# Patient Record
Sex: Female | Born: 1970 | Race: Black or African American | Hispanic: No | Marital: Married | Smoking: Never smoker
Health system: Southern US, Community
[De-identification: ages and names within clinical notes are randomized; demographics above are authoritative.]

## PROBLEM LIST (undated history)

## (undated) DIAGNOSIS — Z9229 Personal history of other drug therapy: Secondary | ICD-10-CM

## (undated) DIAGNOSIS — M1990 Unspecified osteoarthritis, unspecified site: Secondary | ICD-10-CM

## (undated) DIAGNOSIS — K219 Gastro-esophageal reflux disease without esophagitis: Secondary | ICD-10-CM

## (undated) DIAGNOSIS — T7840XA Allergy, unspecified, initial encounter: Secondary | ICD-10-CM

## (undated) DIAGNOSIS — E785 Hyperlipidemia, unspecified: Secondary | ICD-10-CM

## (undated) DIAGNOSIS — K859 Acute pancreatitis without necrosis or infection, unspecified: Secondary | ICD-10-CM

## (undated) DIAGNOSIS — N200 Calculus of kidney: Secondary | ICD-10-CM

## (undated) DIAGNOSIS — R519 Headache, unspecified: Secondary | ICD-10-CM

## (undated) DIAGNOSIS — J45909 Unspecified asthma, uncomplicated: Secondary | ICD-10-CM

## (undated) DIAGNOSIS — H269 Unspecified cataract: Secondary | ICD-10-CM

## (undated) HISTORY — DX: Calculus of kidney: N20.0

## (undated) HISTORY — PX: EYE SURGERY: SHX253

## (undated) HISTORY — DX: Gastro-esophageal reflux disease without esophagitis: K21.9

## (undated) HISTORY — PX: HIP ARTHROPLASTY: SHX981

## (undated) HISTORY — DX: Allergy, unspecified, initial encounter: T78.40XA

## (undated) HISTORY — DX: Unspecified osteoarthritis, unspecified site: M19.90

## (undated) HISTORY — PX: CHOLECYSTECTOMY: SHX55

## (undated) HISTORY — PX: HIP SURGERY: SHX245

## (undated) HISTORY — DX: Personal history of other drug therapy: Z92.29

## (undated) HISTORY — PX: ABDOMINAL HYSTERECTOMY: SHX81

## (undated) HISTORY — DX: Hyperlipidemia, unspecified: E78.5

## (undated) HISTORY — PX: OTHER SURGICAL HISTORY: SHX169

## (undated) HISTORY — PX: TUBAL LIGATION: SHX77

## (undated) HISTORY — PX: JOINT REPLACEMENT: SHX530

---

## 1997-09-04 ENCOUNTER — Encounter: Admission: RE | Admit: 1997-09-04 | Discharge: 1997-09-04 | Payer: Self-pay | Admitting: Obstetrics & Gynecology

## 1997-09-17 ENCOUNTER — Ambulatory Visit (HOSPITAL_COMMUNITY): Admission: RE | Admit: 1997-09-17 | Discharge: 1997-09-17 | Payer: Self-pay | Admitting: Obstetrics & Gynecology

## 1997-11-12 ENCOUNTER — Emergency Department (HOSPITAL_COMMUNITY): Admission: EM | Admit: 1997-11-12 | Discharge: 1997-11-12 | Payer: Self-pay | Admitting: Emergency Medicine

## 1998-01-10 ENCOUNTER — Emergency Department (HOSPITAL_COMMUNITY): Admission: EM | Admit: 1998-01-10 | Discharge: 1998-01-10 | Payer: Self-pay

## 1998-01-23 ENCOUNTER — Other Ambulatory Visit: Admission: RE | Admit: 1998-01-23 | Discharge: 1998-01-23 | Payer: Self-pay | Admitting: Obstetrics & Gynecology

## 1998-02-18 ENCOUNTER — Ambulatory Visit (HOSPITAL_COMMUNITY): Admission: RE | Admit: 1998-02-18 | Discharge: 1998-02-18 | Payer: Self-pay | Admitting: Obstetrics & Gynecology

## 1998-09-17 ENCOUNTER — Encounter: Admission: RE | Admit: 1998-09-17 | Discharge: 1998-12-16 | Payer: Self-pay | Admitting: *Deleted

## 1998-10-15 ENCOUNTER — Inpatient Hospital Stay (HOSPITAL_COMMUNITY): Admission: AD | Admit: 1998-10-15 | Discharge: 1998-10-15 | Payer: Self-pay | Admitting: Obstetrics and Gynecology

## 1998-11-28 ENCOUNTER — Inpatient Hospital Stay (HOSPITAL_COMMUNITY): Admission: AD | Admit: 1998-11-28 | Discharge: 1998-11-28 | Payer: Self-pay | Admitting: Obstetrics and Gynecology

## 1999-03-01 ENCOUNTER — Emergency Department (HOSPITAL_COMMUNITY): Admission: EM | Admit: 1999-03-01 | Discharge: 1999-03-01 | Payer: Self-pay | Admitting: Emergency Medicine

## 1999-03-14 ENCOUNTER — Encounter: Admission: RE | Admit: 1999-03-14 | Discharge: 1999-04-03 | Payer: Self-pay | Admitting: Orthopedic Surgery

## 1999-04-01 ENCOUNTER — Other Ambulatory Visit: Admission: RE | Admit: 1999-04-01 | Discharge: 1999-04-01 | Payer: Self-pay | Admitting: Obstetrics & Gynecology

## 1999-11-15 ENCOUNTER — Inpatient Hospital Stay (HOSPITAL_COMMUNITY): Admission: AD | Admit: 1999-11-15 | Discharge: 1999-11-15 | Payer: Self-pay | Admitting: Obstetrics and Gynecology

## 2002-03-17 ENCOUNTER — Other Ambulatory Visit: Admission: RE | Admit: 2002-03-17 | Discharge: 2002-03-17 | Payer: Self-pay | Admitting: Obstetrics and Gynecology

## 2002-06-30 ENCOUNTER — Ambulatory Visit (HOSPITAL_COMMUNITY): Admission: RE | Admit: 2002-06-30 | Discharge: 2002-06-30 | Payer: Self-pay | Admitting: Obstetrics and Gynecology

## 2004-10-15 ENCOUNTER — Ambulatory Visit (HOSPITAL_COMMUNITY): Admission: RE | Admit: 2004-10-15 | Discharge: 2004-10-15 | Payer: Self-pay | Admitting: Obstetrics and Gynecology

## 2005-05-16 ENCOUNTER — Emergency Department (HOSPITAL_COMMUNITY): Admission: EM | Admit: 2005-05-16 | Discharge: 2005-05-16 | Payer: Self-pay | Admitting: Emergency Medicine

## 2005-08-22 ENCOUNTER — Emergency Department (HOSPITAL_COMMUNITY): Admission: EM | Admit: 2005-08-22 | Discharge: 2005-08-22 | Payer: Self-pay | Admitting: Emergency Medicine

## 2005-09-07 ENCOUNTER — Ambulatory Visit: Payer: Self-pay | Admitting: Gastroenterology

## 2005-09-30 ENCOUNTER — Ambulatory Visit: Payer: Self-pay | Admitting: Gastroenterology

## 2005-10-06 ENCOUNTER — Ambulatory Visit: Payer: Self-pay | Admitting: Gastroenterology

## 2006-10-20 ENCOUNTER — Ambulatory Visit: Payer: Self-pay | Admitting: Internal Medicine

## 2006-10-20 LAB — CONVERTED CEMR LAB
Albumin: 3.7 g/dL (ref 3.5–5.2)
Basophils Absolute: 0 10*3/uL (ref 0.0–0.1)
Basophils Relative: 0.6 % (ref 0.0–1.0)
Bilirubin Urine: NEGATIVE
Bilirubin, Direct: 0.1 mg/dL (ref 0.0–0.3)
CO2: 29 meq/L (ref 19–32)
Cholesterol: 248 mg/dL (ref 0–200)
Crystals: NEGATIVE
Eosinophils Absolute: 0.2 10*3/uL (ref 0.0–0.6)
Eosinophils Relative: 2.5 % (ref 0.0–5.0)
GFR calc non Af Amer: 86 mL/min
Glucose, Bld: 97 mg/dL (ref 70–99)
Hemoglobin, Urine: NEGATIVE
Hemoglobin: 12.3 g/dL (ref 12.0–15.0)
MCHC: 34 g/dL (ref 30.0–36.0)
MCV: 87.3 fL (ref 78.0–100.0)
Monocytes Absolute: 0.5 10*3/uL (ref 0.2–0.7)
Nitrite: POSITIVE — AB
Platelets: 301 10*3/uL (ref 150–400)
RBC: 4.13 M/uL (ref 3.87–5.11)
RDW: 12.6 % (ref 11.5–14.6)
Sodium: 141 meq/L (ref 135–145)
Specific Gravity, Urine: 1.025 (ref 1.000–1.03)
Total Protein, Urine: NEGATIVE mg/dL
Triglycerides: 52 mg/dL (ref 0–149)
WBC: 7.9 10*3/uL (ref 4.5–10.5)
pH: 6 (ref 5.0–8.0)

## 2007-02-16 ENCOUNTER — Ambulatory Visit: Payer: Self-pay | Admitting: Internal Medicine

## 2007-02-16 ENCOUNTER — Encounter: Payer: Self-pay | Admitting: Internal Medicine

## 2007-02-16 DIAGNOSIS — Z8639 Personal history of other endocrine, nutritional and metabolic disease: Secondary | ICD-10-CM

## 2007-02-16 DIAGNOSIS — J309 Allergic rhinitis, unspecified: Secondary | ICD-10-CM | POA: Insufficient documentation

## 2007-02-16 DIAGNOSIS — K219 Gastro-esophageal reflux disease without esophagitis: Secondary | ICD-10-CM | POA: Insufficient documentation

## 2007-02-16 DIAGNOSIS — Z862 Personal history of diseases of the blood and blood-forming organs and certain disorders involving the immune mechanism: Secondary | ICD-10-CM | POA: Insufficient documentation

## 2007-02-16 DIAGNOSIS — B3731 Acute candidiasis of vulva and vagina: Secondary | ICD-10-CM | POA: Insufficient documentation

## 2007-02-16 DIAGNOSIS — B373 Candidiasis of vulva and vagina: Secondary | ICD-10-CM

## 2007-02-16 DIAGNOSIS — E785 Hyperlipidemia, unspecified: Secondary | ICD-10-CM | POA: Insufficient documentation

## 2007-02-16 DIAGNOSIS — N760 Acute vaginitis: Secondary | ICD-10-CM | POA: Insufficient documentation

## 2007-02-16 LAB — CONVERTED CEMR LAB
BUN: 8 mg/dL (ref 6–23)
Chloride: 106 meq/L (ref 96–112)
Potassium: 5 meq/L (ref 3.5–5.1)
Sodium: 139 meq/L (ref 135–145)

## 2007-06-16 ENCOUNTER — Ambulatory Visit: Payer: Self-pay | Admitting: Internal Medicine

## 2007-06-16 DIAGNOSIS — H6690 Otitis media, unspecified, unspecified ear: Secondary | ICD-10-CM | POA: Insufficient documentation

## 2008-02-29 ENCOUNTER — Encounter: Admission: RE | Admit: 2008-02-29 | Discharge: 2008-02-29 | Payer: Self-pay | Admitting: Obstetrics and Gynecology

## 2008-03-16 ENCOUNTER — Encounter: Admission: RE | Admit: 2008-03-16 | Discharge: 2008-03-16 | Payer: Self-pay | Admitting: Obstetrics and Gynecology

## 2009-02-20 ENCOUNTER — Encounter: Admission: RE | Admit: 2009-02-20 | Discharge: 2009-02-20 | Payer: Self-pay | Admitting: Gastroenterology

## 2009-02-22 ENCOUNTER — Emergency Department (HOSPITAL_COMMUNITY): Admission: EM | Admit: 2009-02-22 | Discharge: 2009-02-23 | Payer: Self-pay | Admitting: Emergency Medicine

## 2009-04-27 HISTORY — PX: OTHER SURGICAL HISTORY: SHX169

## 2010-02-18 ENCOUNTER — Ambulatory Visit (HOSPITAL_COMMUNITY): Admission: RE | Admit: 2010-02-18 | Discharge: 2010-02-18 | Payer: Self-pay | Admitting: Obstetrics and Gynecology

## 2010-03-02 ENCOUNTER — Inpatient Hospital Stay (HOSPITAL_COMMUNITY): Admission: AD | Admit: 2010-03-02 | Discharge: 2010-03-02 | Payer: Self-pay | Admitting: Obstetrics

## 2010-07-09 LAB — TYPE AND SCREEN
ABO/RH(D): O POS
DAT, IgG: NEGATIVE
Unit division: 0

## 2010-07-09 LAB — ABO/RH: ABO/RH(D): O POS

## 2010-07-09 LAB — CBC
HCT: 38.2 % (ref 36.0–46.0)
Hemoglobin: 13.2 g/dL (ref 12.0–15.0)
MCHC: 34.5 g/dL (ref 30.0–36.0)
MCV: 87 fL (ref 78.0–100.0)
WBC: 8.1 10*3/uL (ref 4.0–10.5)

## 2010-07-09 LAB — SURGICAL PCR SCREEN
MRSA, PCR: NEGATIVE
Staphylococcus aureus: NEGATIVE

## 2010-07-31 LAB — URINE CULTURE
Colony Count: NO GROWTH
Culture: NO GROWTH

## 2010-07-31 LAB — URINALYSIS, ROUTINE W REFLEX MICROSCOPIC
Bilirubin Urine: NEGATIVE
Glucose, UA: NEGATIVE mg/dL
Hgb urine dipstick: NEGATIVE

## 2010-07-31 LAB — URINE MICROSCOPIC-ADD ON

## 2010-07-31 LAB — DIFFERENTIAL
Basophils Absolute: 0 10*3/uL (ref 0.0–0.1)
Basophils Relative: 0 % (ref 0–1)
Eosinophils Relative: 4 % (ref 0–5)
Lymphocytes Relative: 33 % (ref 12–46)
Lymphs Abs: 3.5 10*3/uL (ref 0.7–4.0)
Neutrophils Relative %: 58 % (ref 43–77)

## 2010-07-31 LAB — CBC
HCT: 38.4 % (ref 36.0–46.0)
RDW: 13.4 % (ref 11.5–15.5)
WBC: 10.5 10*3/uL (ref 4.0–10.5)

## 2010-07-31 LAB — POCT I-STAT, CHEM 8
Chloride: 105 mEq/L (ref 96–112)
HCT: 40 % (ref 36.0–46.0)

## 2010-07-31 LAB — POCT PREGNANCY, URINE: Preg Test, Ur: NEGATIVE

## 2010-10-03 ENCOUNTER — Emergency Department (HOSPITAL_COMMUNITY): Payer: 59

## 2010-10-03 ENCOUNTER — Emergency Department (HOSPITAL_COMMUNITY)
Admission: EM | Admit: 2010-10-03 | Discharge: 2010-10-04 | Disposition: A | Discharge status: Home / Self Care | Payer: 59 | Attending: Emergency Medicine | Admitting: Emergency Medicine

## 2010-10-03 DIAGNOSIS — R197 Diarrhea, unspecified: Secondary | ICD-10-CM | POA: Insufficient documentation

## 2010-10-03 DIAGNOSIS — Z87442 Personal history of urinary calculi: Secondary | ICD-10-CM | POA: Insufficient documentation

## 2010-10-03 DIAGNOSIS — R112 Nausea with vomiting, unspecified: Secondary | ICD-10-CM | POA: Insufficient documentation

## 2010-10-03 DIAGNOSIS — R109 Unspecified abdominal pain: Secondary | ICD-10-CM | POA: Insufficient documentation

## 2010-10-03 LAB — COMPREHENSIVE METABOLIC PANEL
AST: 15 U/L (ref 0–37)
Albumin: 3.9 g/dL (ref 3.5–5.2)
Alkaline Phosphatase: 69 U/L (ref 39–117)
BUN: 10 mg/dL (ref 6–23)
Glucose, Bld: 95 mg/dL (ref 70–99)
Potassium: 4.1 mEq/L (ref 3.5–5.1)
Sodium: 137 mEq/L (ref 135–145)
Total Bilirubin: 0.4 mg/dL (ref 0.3–1.2)
Total Protein: 7.3 g/dL (ref 6.0–8.3)

## 2010-10-03 LAB — URINALYSIS, ROUTINE W REFLEX MICROSCOPIC
Hgb urine dipstick: NEGATIVE
Nitrite: NEGATIVE

## 2010-10-03 LAB — DIFFERENTIAL
Basophils Relative: 1 % (ref 0–1)
Monocytes Absolute: 0.5 10*3/uL (ref 0.1–1.0)
Neutrophils Relative %: 47 % (ref 43–77)

## 2010-10-03 LAB — CBC
HCT: 38.7 % (ref 36.0–46.0)
MCH: 29 pg (ref 26.0–34.0)
MCHC: 33.9 g/dL (ref 30.0–36.0)
MCV: 85.6 fL (ref 78.0–100.0)
Platelets: 284 10*3/uL (ref 150–400)
RBC: 4.52 MIL/uL (ref 3.87–5.11)
RDW: 13.1 % (ref 11.5–15.5)
WBC: 8.8 10*3/uL (ref 4.0–10.5)

## 2010-10-04 LAB — PREGNANCY, URINE: Preg Test, Ur: NEGATIVE

## 2011-01-22 ENCOUNTER — Other Ambulatory Visit: Payer: Self-pay | Admitting: Family Medicine

## 2011-01-22 DIAGNOSIS — Z1231 Encounter for screening mammogram for malignant neoplasm of breast: Secondary | ICD-10-CM

## 2011-01-22 DIAGNOSIS — M792 Neuralgia and neuritis, unspecified: Secondary | ICD-10-CM | POA: Insufficient documentation

## 2011-01-22 DIAGNOSIS — M76899 Other specified enthesopathies of unspecified lower limb, excluding foot: Secondary | ICD-10-CM | POA: Insufficient documentation

## 2011-01-22 DIAGNOSIS — M5410 Radiculopathy, site unspecified: Secondary | ICD-10-CM | POA: Insufficient documentation

## 2011-01-22 DIAGNOSIS — M7062 Trochanteric bursitis, left hip: Secondary | ICD-10-CM | POA: Insufficient documentation

## 2011-02-24 ENCOUNTER — Ambulatory Visit
Admission: RE | Admit: 2011-02-24 | Discharge: 2011-02-24 | Disposition: A | Discharge status: Home / Self Care | Payer: 59 | Source: Ambulatory Visit | Attending: Family Medicine | Admitting: Family Medicine

## 2011-02-24 DIAGNOSIS — Z1231 Encounter for screening mammogram for malignant neoplasm of breast: Secondary | ICD-10-CM

## 2011-05-01 ENCOUNTER — Ambulatory Visit (INDEPENDENT_AMBULATORY_CARE_PROVIDER_SITE_OTHER): Payer: 59

## 2011-05-01 DIAGNOSIS — M940 Chondrocostal junction syndrome [Tietze]: Secondary | ICD-10-CM

## 2011-05-01 DIAGNOSIS — R079 Chest pain, unspecified: Secondary | ICD-10-CM

## 2011-06-06 ENCOUNTER — Other Ambulatory Visit: Payer: Self-pay | Admitting: Physician Assistant

## 2011-06-07 ENCOUNTER — Ambulatory Visit (INDEPENDENT_AMBULATORY_CARE_PROVIDER_SITE_OTHER): Payer: 59 | Admitting: Physician Assistant

## 2011-06-07 DIAGNOSIS — B001 Herpesviral vesicular dermatitis: Secondary | ICD-10-CM

## 2011-06-07 DIAGNOSIS — B009 Herpesviral infection, unspecified: Secondary | ICD-10-CM

## 2011-06-07 DIAGNOSIS — N760 Acute vaginitis: Secondary | ICD-10-CM

## 2011-06-07 LAB — POCT WET PREP WITH KOH
KOH Prep POC: NEGATIVE
RBC Wet Prep HPF POC: NEGATIVE

## 2011-06-07 MED ORDER — VALACYCLOVIR HCL 1 G PO TABS: ORAL_TABLET | ORAL | Status: DC

## 2011-06-07 MED ORDER — AMBULATORY NON FORMULARY MEDICATION: 1.0000 | Status: DC

## 2011-06-07 NOTE — Progress Notes (Signed)
  Subjective:    Patient ID: Kendra Gonzalez, female    DOB: 25-Aug-1970, 41 y.o.   MRN: [id number]  HPI Pt presents for recurrent vaginal d/c and irritation.  H/o recurrent BV and yeast, seems to get it whenever she has sex with husband (no change in partner - has decrease her frequency of intercourse b/c she always seems to get BV afterwards.)  In the Fall we used boric acid and her symptoms improved significantly.  She is getting frustrated.   Review of Systems  Genitourinary: Positive for vaginal discharge (Vaginal odor). Negative for dysuria, vaginal bleeding and difficulty urinating.       Objective:   Physical Exam  Constitutional: She appears well-developed and well-nourished.  HENT:  Head: Normocephalic and atraumatic.  Right Ear: External ear normal.  Left Ear: External ear normal.   Self wet prep.        Assessment & Plan:   1. Vaginitis  POCT Wet Prep with KOH  2. Herpes labialis  valACYclovir (VALTREX) 1000 MG tablet  3. Vaginitis and vulvovaginitis, unspecified  AMBULATORY NON FORMULARY MEDICATION   Mild infection.  D/w pt her sensitivity, since the boric acid worked so well will trial it at 1 capsule weekly for 2 weeks to get back to baseline and then q monthly as symptoms are controlled.  If her symptoms seem to not be controlled may want to do after sex because that seems to be her trigger.

## 2011-07-13 ENCOUNTER — Ambulatory Visit (INDEPENDENT_AMBULATORY_CARE_PROVIDER_SITE_OTHER): Payer: 59 | Admitting: Family Medicine

## 2011-07-13 ENCOUNTER — Emergency Department (HOSPITAL_COMMUNITY)
Admission: EM | Admit: 2011-07-13 | Discharge: 2011-07-14 | Disposition: A | Discharge status: Home / Self Care | Payer: 59 | Attending: Emergency Medicine | Admitting: Emergency Medicine

## 2011-07-13 ENCOUNTER — Ambulatory Visit: Payer: 59

## 2011-07-13 ENCOUNTER — Encounter (HOSPITAL_COMMUNITY): Payer: Self-pay | Admitting: Emergency Medicine

## 2011-07-13 VITALS — BP 135/79 | HR 97 | Temp 98.4°F | Resp 24 | Ht 64.0 in | Wt 192.8 lb

## 2011-07-13 DIAGNOSIS — R0781 Pleurodynia: Secondary | ICD-10-CM

## 2011-07-13 DIAGNOSIS — R1011 Right upper quadrant pain: Secondary | ICD-10-CM

## 2011-07-13 DIAGNOSIS — R079 Chest pain, unspecified: Secondary | ICD-10-CM

## 2011-07-13 DIAGNOSIS — Z0389 Encounter for observation for other suspected diseases and conditions ruled out: Secondary | ICD-10-CM | POA: Insufficient documentation

## 2011-07-13 DIAGNOSIS — R109 Unspecified abdominal pain: Secondary | ICD-10-CM

## 2011-07-13 LAB — POCT UA - MICROSCOPIC ONLY
Bacteria, U Microscopic: NEGATIVE
Casts, Ur, LPF, POC: NEGATIVE
Crystals, Ur, HPF, POC: NEGATIVE
Epithelial cells, urine per micros: NEGATIVE
Mucus, UA: NEGATIVE
RBC, urine, microscopic: NEGATIVE
Yeast, UA: NEGATIVE

## 2011-07-13 LAB — POCT CBC
Granulocyte percent: 47.9 % (ref 37–80)
HCT, POC: 40.5 % (ref 37.7–47.9)
Hemoglobin: 13.2 g/dL (ref 12.2–16.2)
Lymph, poc: 4.9 — AB (ref 0.6–3.4)
MCH, POC: 29.4 pg (ref 27–31.2)
MCHC: 32.6 g/dL (ref 31.8–35.4)
MCV: 90.1 fL (ref 80–97)
MID (cbc): 0.9 (ref 0–0.9)
MPV: 8.9 fL (ref 0–99.8)
POC Granulocyte: 5.4 (ref 2–6.9)
POC LYMPH PERCENT: 44 % (ref 10–50)
POC MID %: 8.1 % (ref 0–12)
Platelet Count, POC: 361 10*3/uL (ref 142–424)
RBC: 4.49 M/uL (ref 4.04–5.48)
RDW, POC: 14 %
WBC: 11.2 10*3/uL — AB (ref 4.6–10.2)

## 2011-07-13 LAB — POCT URINALYSIS DIPSTICK
Bilirubin, UA: NEGATIVE
Glucose, UA: NEGATIVE
Ketones, UA: NEGATIVE
Leukocytes, UA: NEGATIVE
Nitrite, UA: NEGATIVE
Protein, UA: NEGATIVE
Spec Grav, UA: >=1.03
Urobilinogen, UA: 0.2
pH, UA: 5

## 2011-07-13 MED ORDER — KETOROLAC TROMETHAMINE 30 MG/ML IJ SOLN
30.0000 mg | Freq: Once | INTRAMUSCULAR | Status: AC
  Administered 2011-07-13: 30 mg via INTRAMUSCULAR

## 2011-07-13 NOTE — ED Notes (Signed)
Pt states she went to urgent care for right abd pain  Pt sent here for a CT scan to r/o kidney stone  Per labs there pt has a WBC of 11.2 and a trace of blood in her urine specimen  Pt states she started having pain in her right side this afternoon about 1630  Pt denies N/V  Pt states her pain is mainly in her RUQ  Denies back pain but states when the dr tapped her kidneys she had pain

## 2011-07-13 NOTE — Progress Notes (Signed)
[facility]:  Office Visit  Chief Complaint:  Chief Complaint  Patient presents with  . Abdominal Pain    RUQ-severe--started at 4:45pm when stepping over baby gate  . Back Pain    HPI: Kendra Gonzalez is a 41 y.o. female who complains of  Acute onset of 10/10 sharp  RUQ abd pain at around 5 pm, localize to RUQ. Denies fevers, chills, N/V, dysuria, hematuria, melena. History of right kidney stone 2-3 years ago; never passed stone. Has not tried anything OTC. Able to pass urine.  Past Medical History  Diagnosis Date  . Kidney stones   . Endometriosis   . Hyperlipidemia    Past Surgical History  Procedure Date  . Cholecystectomy   . Abdominal hysterectomy   . Oophrectomy 2011    secondary to cysts   History   Social History  . Marital Status: Married    Spouse Name: N/A    Number of Children: N/A  . Years of Education: N/A   Social History Main Topics  . Smoking status: Never Smoker   . Smokeless tobacco: Never Used  . Alcohol Use: No  . Drug Use: No  . Sexually Active: None   Other Topics Concern  . None   Social History Narrative  . None   No family history on file. No Known Allergies Prior to Admission medications   Medication Sig Start Date End Date Taking? Authorizing Provider  AMBULATORY NON FORMULARY MEDICATION Place 1 capsule vaginally once a week. Medication Name: Boric Acid 600mg  suppository 06/07/11  Yes Morrell Riddle, PA-C  valACYclovir (VALTREX) 1000 MG tablet 2 po at outbreak onset then repeat in 12h for each outbreak 06/07/11 06/06/12 Yes Sarah Harvie Bridge, PA-C     ROS: The patient denies fevers, chills, night sweats, unintentional weight loss, chest pain, palpitations, wheezing, dyspnea on exertion, nausea, vomiting, dysuria, hematuria, melena, numbness, weakness, or tingling. + abdominal pain  All other systems have been reviewed and were otherwise negative with the exception of those mentioned in the HPI and as above.     PHYSICAL EXAM: Filed Vitals:   07/13/11 1938  BP: 135/79  Pulse: 97  Temp: 98.4 F (36.9 C)  Resp: 24   Filed Vitals:   07/13/11 1938  Height: 5\' 4"  (1.626 m)  Weight: 192 lb 12.8 oz (87.454 kg)   Body mass index is 33.09 kg/(m^2).  General: Alert, moderate acute distress HEENT:  Normocephalic, atraumatic, oropharynx patent.  Cardiovascular:  Regular rate and rhythm, no rubs murmurs or gallops.  No Carotid bruits, radial pulse intact. No pedal edema.  Respiratory: Clear to auscultation bilaterally.  No wheezes, rales, or rhonchi.  No cyanosis, no use of accessory musculature.  GI: No organomegaly, abdomen is soft, positive bowel sounds.  No masses. + tender RUQ, + CVA  tenderness right side. No guarding,rebound Skin: No rashes. No jaundice Neurologic: Facial musculature symmetric. Psychiatric: Patient is appropriate throughout our interaction. Lymphatic: No cervical lymphadenopathy.  Musculoskeletal: Gait intact.   LABS: Results for orders placed in visit on 07/13/11  POCT CBC      Component Value Range   WBC 11.2 (*) 4.6 - 10.2 (K/uL)   Lymph, poc 4.9 (*) 0.6 - 3.4    POC LYMPH PERCENT 44.0  10 - 50 (%L)   MID (cbc) 0.9  0 - 0.9    POC MID % 8.1  0 - 12 (%M)   POC Granulocyte 5.4  2 - 6.9  Granulocyte percent 47.9  37 - 80 (%G)   RBC 4.49  4.04 - 5.48 (M/uL)   Hemoglobin 13.2  12.2 - 16.2 (g/dL)   HCT, POC 16.1  09.6 - 47.9 (%)   MCV 90.1  80 - 97 (fL)   MCH, POC 29.4  27 - 31.2 (pg)   MCHC 32.6  31.8 - 35.4 (g/dL)   RDW, POC 04.5     Platelet Count, POC 361  142 - 424 (K/uL)   MPV 8.9  0 - 99.8 (fL)  POCT UA - MICROSCOPIC ONLY      Component Value Range   WBC, Ur, HPF, POC 0-3     RBC, urine, microscopic neg     Bacteria, U Microscopic neg     Mucus, UA neg     Epithelial cells, urine per micros neg     Crystals, Ur, HPF, POC neg     Casts, Ur, LPF, POC neg     Yeast, UA neg    POCT URINALYSIS DIPSTICK      Component Value Range   Color, UA yellow      Clarity, UA clear     Glucose, UA neg     Bilirubin, UA neg     Ketones, UA neg     Spec Grav, UA >=1.030     Blood, UA trace     pH, UA 5.0     Protein, UA neg     Urobilinogen, UA 0.2     Nitrite, UA neg     Leukocytes, UA Negative       EKG/XRAY:   Primary read interpreted by Dr. Conley Rolls at [facility]. Acute abd series- chest xray nl, abdomen does not show any obstruction, free air Rib xray-no fx or dislocations.    ASSESSMENT/PLAN: Encounter Diagnoses  Name Primary?  . Abdominal pain, acute, right upper quadrant Yes  . Flank pain   . Rib pain on right side    Most likely renal stone related. She was given Toradol 30 mg in office. CBC had elevated WBC of 11.2. Had asked if she wanted to get sxs care as outpatient and get CT scan as outpatient and get referral for CT but she is in so much pain that she would rather go to ER due to worsening pain. Patient will go to [facility] ER for CT scan of abdomen/pelvis.   Spoke with Press photographer in ER, [place].   Hamilton Capri PHUONG, DO 07/13/2011 8:45 PM

## 2011-07-14 ENCOUNTER — Telehealth: Payer: Self-pay | Admitting: Family Medicine

## 2011-07-14 LAB — COMPREHENSIVE METABOLIC PANEL WITH GFR
ALT: 17 U/L (ref 0–35)
Alkaline Phosphatase: 71 U/L (ref 39–117)
CO2: 26 meq/L (ref 19–32)
Sodium: 138 meq/L (ref 135–145)
Total Bilirubin: 0.4 mg/dL (ref 0.3–1.2)
Total Protein: 7.3 g/dL (ref 6.0–8.3)

## 2011-07-14 LAB — COMPREHENSIVE METABOLIC PANEL
AST: 21 U/L (ref 0–37)
Albumin: 4.5 g/dL (ref 3.5–5.2)
BUN: 11 mg/dL (ref 6–23)
Calcium: 9.8 mg/dL (ref 8.4–10.5)
Chloride: 103 mEq/L (ref 96–112)
Creat: 0.85 mg/dL (ref 0.50–1.10)
Glucose, Bld: 89 mg/dL (ref 70–99)
Potassium: 4.2 mEq/L (ref 3.5–5.3)

## 2011-07-14 NOTE — Telephone Encounter (Signed)
LM  With husband regarding xray results, ? Rib fx 7th rib to explain her pain.

## 2011-07-14 NOTE — Telephone Encounter (Signed)
Asked if patient's pain has improved, she got triaged but did not want to wait in ER so left.  Spoke with husbnad. Pain is better.

## 2011-07-14 NOTE — ED Notes (Signed)
Pt wanting to leave  Explained to pt she was next to get a bed and explained delay to pt   Encouraged pt to stay but pt declined

## 2011-10-03 ENCOUNTER — Ambulatory Visit (INDEPENDENT_AMBULATORY_CARE_PROVIDER_SITE_OTHER): Payer: 59 | Admitting: Family Medicine

## 2011-10-03 VITALS — BP 115/70 | HR 80 | Temp 97.7°F | Resp 18 | Ht 64.0 in | Wt 190.0 lb

## 2011-10-03 DIAGNOSIS — L234 Allergic contact dermatitis due to dyes: Secondary | ICD-10-CM

## 2011-10-03 DIAGNOSIS — L258 Unspecified contact dermatitis due to other agents: Secondary | ICD-10-CM

## 2011-10-03 MED ORDER — PREDNISONE 20 MG PO TABS: ORAL_TABLET | ORAL | Status: DC

## 2011-10-03 MED ORDER — TRIAMCINOLONE ACETONIDE 0.1 % EX CREA: TOPICAL_CREAM | Freq: Two times a day (BID) | CUTANEOUS | Status: DC

## 2011-10-03 NOTE — Progress Notes (Signed)
Subjective: Got a tattoo on her right upper chest in March. About mid May she started breaking out with a rash. It is itching.  She's had several tattoos in the past and no problems with them.  Objective: The tattoo has areas of the segment that are reacting with Paster bumps along them, along the areas that seem to have gotten the densest dose of the dye.  Assessment allergic dermatitis  Plan: Prednisone Triamcinolone cream  Return if not doing better

## 2011-10-03 NOTE — Patient Instructions (Signed)
Take the pills as directed. use the cream twice daily. Rub in a small amount well. Return if worse.

## 2011-10-08 ENCOUNTER — Ambulatory Visit (INDEPENDENT_AMBULATORY_CARE_PROVIDER_SITE_OTHER): Payer: 59 | Admitting: Family Medicine

## 2011-10-08 VITALS — BP 117/74 | HR 99 | Temp 99.0°F | Resp 18 | Ht 64.5 in | Wt 195.0 lb

## 2011-10-08 DIAGNOSIS — E559 Vitamin D deficiency, unspecified: Secondary | ICD-10-CM

## 2011-10-08 DIAGNOSIS — E669 Obesity, unspecified: Secondary | ICD-10-CM

## 2011-10-08 NOTE — Patient Instructions (Addendum)
Exercise to Lose Weight Exercise and a healthy diet may help you lose weight. Your doctor may suggest specific exercises. EXERCISE IDEAS AND TIPS  Choose low-cost things you enjoy doing, such as walking, bicycling, or exercising to workout videos.   Take stairs instead of the elevator.   Walk during your lunch break.   Park your car further away from work or school.   Go to a gym or an exercise class.   Start with 5 to 10 minutes of exercise each day. Build up to 30 minutes of exercise 4 to 6 days a week.   Wear shoes with good support and comfortable clothes.   Stretch before and after working out.   Work out until you breathe harder and your heart beats faster.   Drink extra water when you exercise.   Do not do so much that you hurt yourself, feel dizzy, or get very short of breath.  Exercises that burn about 150 calories:  Running 1  miles in 15 minutes.   Playing volleyball for 45 to 60 minutes.   Washing and waxing a car for 45 to 60 minutes.   Playing touch football for 45 minutes.   Walking 1  miles in 35 minutes.   Pushing a stroller 1  miles in 30 minutes.   Playing basketball for 30 minutes.   Raking leaves for 30 minutes.   Bicycling 5 miles in 30 minutes.   Walking 2 miles in 30 minutes.   Dancing for 30 minutes.   Shoveling snow for 15 minutes.   Swimming laps for 20 minutes.   Walking up stairs for 15 minutes.   Bicycling 4 miles in 15 minutes.   Gardening for 30 to 45 minutes.   Jumping rope for 15 minutes.   Washing windows or floors for 45 to 60 minutes.  Document Released: 05/16/2010 Document Revised: 04/02/2011 Document Reviewed: 05/16/2010 [facility] Patient Information 2012 [place], [place]      Vitamin D Deficiency  Not having enough vitamin D is called a deficiency. Your body needs this vitamin to keep your bones strong and healthy. Having too Fetherolf of it can make your bones soft or can cause other health problems.  HOME  CARE  Take all vitamins, herbs, or nutrition drinks (supplements) as told by your doctor.   Have your blood tested 2 months after taking vitamins, herbs, or nutrition drinks.   Eat foods that have vitamin D. This includes:   Dairy products, cereals, or juices with added vitamin D. Check the label.   Fatty fish like salmon or trout.   Eggs.   Oysters.   Go outside for 10 to 15 minutes when the sun is shining. Do this 3 times a week. Do not do this if you have skin cancer.   Do not use tanning beds.   Stay at a healthy weight. Lose weight if needed.   Keep all doctor visits as told.  GET HELP IF:  You have questions.   You continue to have problems.   You feel sick to your stomach (nauseous) or throw up (vomit).   You cannot go poop (constipated).   You feel confused.   You have severe belly (abdominal) or back pain.  MAKE SURE YOU:  Understand these instructions.   Will watch your condition.   Will get help right away if you are not doing well or get worse.  Document Released: 04/02/2011 Document Reviewed: 03/31/2011 [facility] Patient Information 2012 [place], [place].  Calorie Counting Diet A calorie counting diet requires you to eat the number of calories that are right for you in a day. Calories are the measurement of how much energy you get from the food you eat. Eating the right amount of calories is important for staying at a healthy weight. If you eat too many calories, your body will store them as fat and you may gain weight. If you eat too few calories, you may lose weight. Counting the number of calories you eat during a day will help you know if you are eating the right amount. A Registered Dietitian can determine how many calories you need in a day. The amount of calories needed varies from person to person. If your goal is to lose weight, you will need to eat fewer calories. Losing weight can benefit you if you are overweight or have health problems  such as heart disease, high blood pressure, or diabetes. If your goal is to gain weight, you will need to eat more calories. Gaining weight may be necessary if you have a certain health problem that causes your body to need more energy. TIPS Whether you are increasing or decreasing the number of calories you eat during a day, it may be hard to get used to changes in what you eat and drink. The following are tips to help you keep track of the number of calories you eat.  Measure foods at home with measuring cups. This helps you know the amount of food and number of calories you are eating.   Restaurants often serve food in amounts that are larger than 1 serving. While eating out, estimate how many servings of a food you are given. For example, a serving of cooked rice is  cup or about the size of half of a fist. Knowing serving sizes will help you be aware of how much food you are eating at restaurants.   Ask for smaller portion sizes or child-size portions at restaurants.   Plan to eat half of a meal at a restaurant. Take the rest home or share the other half with a friend.   Read the Nutrition Facts panel on food labels for calorie content and serving size. You can find out how many servings are in a package, the size of a serving, and the number of calories each serving has.   For example, a package might contain 3 cookies. The Nutrition Facts panel on that package says that 1 serving is 1 cookie. Below that, it will say there are 3 servings in the container. The calories section of the Nutrition Facts label says there are 90 calories. This means there are 90 calories in 1 cookie (1 serving). If you eat 1 cookie you have eaten 90 calories. If you eat all 3 cookies, you have eaten 270 calories (3 servings x 90 calories = 270 calories).  The list below tells you how big or small some common portion sizes are.  1 oz.........4 stacked dice.   3 oz........Marland KitchenDeck of cards.   1 tsp.......Marland KitchenTip of  [place] finger.   1 tbs......Marland KitchenMarland KitchenThumb.   2 tbs.......Marland KitchenGolf ball.    cup......Marland KitchenHalf of a fist.   1 cup.......Marland KitchenA fist.  KEEP A FOOD LOG Write down every food item you eat, the amount you eat, and the number of calories in each food you eat during the day. At the end of the day, you can add up the total number of calories you have eaten. It may help to keep  a list like the one below. Find out the calorie information by reading the Nutrition Facts panel on food labels. Breakfast  Bran cereal (1 cup, 110 calories).   Fat-free milk ( cup, 45 calories).  Snack  Apple (1 medium, 80 calories).  Lunch  Spinach (1 cup, 20 calories).   Tomato ( medium, 20 calories).   Chicken breast strips (3 oz, 165 calories).   Shredded cheddar cheese ( cup, 110 calories).   Light [place] dressing (2 tbs, 60 calories).   Whole-wheat bread (1 slice, 80 calories).   Tub margarine (1 tsp, 35 calories).   Vegetable soup (1 cup, 160 calories).  Dinner  Pork chop (3 oz, 190 calories).   Brown rice (1 cup, 215 calories).   Steamed broccoli ( cup, 20 calories).   Strawberries (1  cup, 65 calories).   Whipped cream (1 tbs, 50 calories).  Daily Calorie Total: 1425 Document Released: 04/13/2005 Document Revised: 04/02/2011 Document Reviewed: 10/08/2006 [facility] Patient Information 2012 [place], [place].

## 2011-10-09 LAB — TSH: TSH: 1.015 u[IU]/mL (ref 0.350–4.500)

## 2011-10-09 LAB — T4, FREE: Free T4: 0.78 ng/dL — ABNORMAL LOW (ref 0.80–1.80)

## 2011-10-12 ENCOUNTER — Encounter: Payer: Self-pay | Admitting: Family Medicine

## 2011-10-12 ENCOUNTER — Other Ambulatory Visit: Payer: Self-pay | Admitting: Family Medicine

## 2011-10-12 DIAGNOSIS — Z6829 Body mass index (BMI) 29.0-29.9, adult: Secondary | ICD-10-CM | POA: Insufficient documentation

## 2011-10-12 MED ORDER — ERGOCALCIFEROL 1.25 MG (50000 UT) PO CAPS: 50000.0000 [IU] | ORAL_CAPSULE | ORAL | Status: DC

## 2011-10-12 NOTE — Progress Notes (Signed)
  Subjective:    Patient ID: Kendra Gonzalez, female    DOB: 1970-06-11, 41 y.o.   MRN: [id number]  HPI   This 41 y.o. AA female is here today to request "diet pills" for weight loss. She has been exercising  and monitoring her dietary intake since Sept 2012 and has lost just a few pounds (clearly frustrated) Knows she has Vit D deficiency but not taking supplement. Denies any significant health issues.    Review of Systems  Constitutional: Positive for activity change. Negative for appetite change, fatigue and unexpected weight change.  HENT: Positive for congestion, rhinorrhea, sneezing and postnasal drip.   Eyes: Negative.   Respiratory: Negative for chest tightness and shortness of breath.   Cardiovascular: Negative for chest pain and palpitations.  Gastrointestinal: Negative.   Musculoskeletal: Negative.   Neurological: Negative.   Psychiatric/Behavioral: Negative.        Objective:   Physical Exam  Nursing note and vitals reviewed. Constitutional: She is oriented to person, place, and time. She appears well-developed and well-nourished. No distress.  HENT:  Head: Normocephalic and atraumatic.  Eyes: Conjunctivae and EOM are normal. No scleral icterus.  Neck: Neck supple. No thyromegaly present.  Cardiovascular: Normal rate, regular rhythm and normal heart sounds.   Pulmonary/Chest: No respiratory distress.  Musculoskeletal: She exhibits no edema.  Neurological: She is alert and oriented to person, place, and time. No cranial nerve deficit.          Assessment & Plan:   1. Obesity (BMI 30.0-34.9) - cautioned against use of "Weight loss supplements" because of lack of long-term efficacy and side effect profile.  Vitamin D, 25-hydroxy, TSH, T4, Free Encouraged continued healthy nutrition with adequate caloric intake and aerobic exercise + strength training 5 days/week  2. Vitamin d deficiency  Advised to resume OTC Vit D supplement 2000 IU daily

## 2011-10-12 NOTE — Progress Notes (Signed)
Quick Note:  Please call pt and advise that the following labs are abnormal...  I have spoken with pt.  Copy of labs to pt. ______

## 2011-10-12 NOTE — Progress Notes (Signed)
I spoke to pt and advise her of low Vit D=19. I will prescribe 50,000 IU capsules to be taken once a week . If the cost is prohibitive/ not covered by her insurer, she will get OTC Vit D3  5000 IU and take 1 capsule daily in addition to sun exposure.  She understands and agrees.

## 2011-10-14 ENCOUNTER — Telehealth: Payer: Self-pay

## 2011-10-14 NOTE — Telephone Encounter (Signed)
Please fax over labs to Dr. Julio Sicks [phone]

## 2011-10-15 NOTE — Telephone Encounter (Signed)
Labs from last visit (June 13) faxed via Epic.

## 2012-01-02 ENCOUNTER — Inpatient Hospital Stay (HOSPITAL_COMMUNITY)
Admission: AD | Admit: 2012-01-02 | Discharge: 2012-01-04 | DRG: 392 | Disposition: A | Discharge status: Home / Self Care | Payer: 59 | Source: Ambulatory Visit | Attending: Family Medicine | Admitting: Family Medicine

## 2012-01-02 ENCOUNTER — Inpatient Hospital Stay (HOSPITAL_COMMUNITY): Payer: 59

## 2012-01-02 ENCOUNTER — Ambulatory Visit: Payer: 59

## 2012-01-02 ENCOUNTER — Ambulatory Visit (INDEPENDENT_AMBULATORY_CARE_PROVIDER_SITE_OTHER): Payer: 59 | Admitting: Family Medicine

## 2012-01-02 ENCOUNTER — Encounter (HOSPITAL_COMMUNITY): Payer: Self-pay | Admitting: Internal Medicine

## 2012-01-02 VITALS — BP 133/85 | HR 105 | Temp 98.8°F | Resp 18 | Ht 64.5 in | Wt 184.0 lb

## 2012-01-02 DIAGNOSIS — J309 Allergic rhinitis, unspecified: Secondary | ICD-10-CM | POA: Diagnosis present

## 2012-01-02 DIAGNOSIS — R109 Unspecified abdominal pain: Secondary | ICD-10-CM

## 2012-01-02 DIAGNOSIS — E876 Hypokalemia: Secondary | ICD-10-CM

## 2012-01-02 DIAGNOSIS — B3731 Acute candidiasis of vulva and vagina: Secondary | ICD-10-CM | POA: Diagnosis present

## 2012-01-02 DIAGNOSIS — R12 Heartburn: Secondary | ICD-10-CM | POA: Diagnosis present

## 2012-01-02 DIAGNOSIS — K56609 Unspecified intestinal obstruction, unspecified as to partial versus complete obstruction: Secondary | ICD-10-CM

## 2012-01-02 DIAGNOSIS — Z90710 Acquired absence of both cervix and uterus: Secondary | ICD-10-CM

## 2012-01-02 DIAGNOSIS — J069 Acute upper respiratory infection, unspecified: Secondary | ICD-10-CM

## 2012-01-02 DIAGNOSIS — E785 Hyperlipidemia, unspecified: Secondary | ICD-10-CM | POA: Diagnosis present

## 2012-01-02 DIAGNOSIS — R1013 Epigastric pain: Principal | ICD-10-CM | POA: Diagnosis present

## 2012-01-02 DIAGNOSIS — B373 Candidiasis of vulva and vagina: Secondary | ICD-10-CM | POA: Diagnosis present

## 2012-01-02 LAB — POCT URINALYSIS DIPSTICK
Bilirubin, UA: NEGATIVE
Blood, UA: NEGATIVE
Nitrite, UA: NEGATIVE
Protein, UA: NEGATIVE
Urobilinogen, UA: 0.2
pH, UA: 6.5

## 2012-01-02 LAB — COMPREHENSIVE METABOLIC PANEL
ALT: 27 U/L (ref 0–35)
Alkaline Phosphatase: 74 U/L (ref 39–117)
CO2: 26 mEq/L (ref 19–32)
Calcium: 10.3 mg/dL (ref 8.4–10.5)
Chloride: 99 mEq/L (ref 96–112)
GFR calc Af Amer: >90 mL/min (ref >90)
GFR calc non Af Amer: 86 mL/min — ABNORMAL LOW (ref >90)
Glucose, Bld: 92 mg/dL (ref 70–99)
Sodium: 134 mEq/L — ABNORMAL LOW (ref 135–145)
Total Bilirubin: 0.8 mg/dL (ref 0.3–1.2)

## 2012-01-02 LAB — POCT CBC
HCT, POC: 45.1 % (ref 37.7–47.9)
Hemoglobin: 14.7 g/dL (ref 12.2–16.2)
Lymph, poc: 1.5 (ref 0.6–3.4)
MCHC: 32.6 g/dL (ref 31.8–35.4)
MCV: 91.7 fL (ref 80–97)
POC Granulocyte: 8.2 — AB (ref 2–6.9)
POC LYMPH PERCENT: 14.4 %L (ref 10–50)
RDW, POC: 13.6 %
WBC: 10.4 10*3/uL — AB (ref 4.6–10.2)

## 2012-01-02 LAB — LIPASE, BLOOD: Lipase: 16 U/L (ref 11–59)

## 2012-01-02 LAB — POCT UA - MICROSCOPIC ONLY
Casts, Ur, LPF, POC: NEGATIVE
Crystals, Ur, HPF, POC: NEGATIVE
Yeast, UA: NEGATIVE

## 2012-01-02 MED ORDER — GI COCKTAIL ~~LOC~~
30.0000 mL | Freq: Once | ORAL | Status: AC
  Administered 2012-01-02: 30 mL via ORAL

## 2012-01-02 MED ORDER — IOHEXOL 300 MG/ML  SOLN
100.0000 mL | Freq: Once | INTRAMUSCULAR | Status: AC | PRN
  Administered 2012-01-02: 100 mL via INTRAVENOUS

## 2012-01-02 MED ORDER — MORPHINE SULFATE 2 MG/ML IJ SOLN
2.0000 mg | INTRAMUSCULAR | Status: DC | PRN
  Filled 2012-01-02: qty 1

## 2012-01-02 MED ORDER — IOHEXOL 300 MG/ML  SOLN
20.0000 mL | INTRAMUSCULAR | Status: AC
  Administered 2012-01-02 (×2): 20 mL via ORAL

## 2012-01-02 MED ORDER — MORPHINE SULFATE 2 MG/ML IJ SOLN
2.0000 mg | INTRAMUSCULAR | Status: DC | PRN
  Administered 2012-01-02: 4 mg via INTRAVENOUS
  Administered 2012-01-02: 2 mg via INTRAVENOUS
  Administered 2012-01-03: 4 mg via INTRAVENOUS
  Filled 2012-01-02 (×2): qty 2

## 2012-01-02 MED ORDER — KCL IN DEXTROSE-NACL 20-5-0.45 MEQ/L-%-% IV SOLN
INTRAVENOUS | Status: DC
  Administered 2012-01-02 – 2012-01-04 (×4): via INTRAVENOUS
  Filled 2012-01-02 (×9): qty 1000

## 2012-01-02 MED ORDER — SALINE SPRAY 0.65 % NA SOLN
1.0000 | NASAL | Status: DC | PRN
  Filled 2012-01-02: qty 44

## 2012-01-02 NOTE — Progress Notes (Signed)
[facility] [address] [place] [address] [phone]  Date:  01/02/2012   Name:  Kendra Gonzalez   DOB:  [birth year]   MRN:  [id number]  PCP:  Virgilio Belling    Chief Complaint: Sinusitis and Abdominal Pain   History of Present Illness:  Kendra Gonzalez is a 41 y.o. very pleasant female patient who presents with the following:  She has noted sinus drainage, sneezing, and a mild ST for 1 day.  No cough, but she does have watery eyes.    She also has "bad stomach pain" that she first noted around 5 am- it woke her from sleep.  She took some sudafed yesterday, but nothing yet today.  She has not been using NSAIDs recently.  Never had pain like this before.  The pain is in her epigastric area.  No nausea or vomiting.   No dysuria, no vaginal symptoms.    She has been drinking water, but not eating anything.  She did eat some pizza last night around 8am.    She has had a hysterectomy, oophorectomy and cholecystectomy in the past.    Patient Active Problem List  Diagnosis  . CANDIDIASIS, VULVA/VAGINA  . HYPERLIPIDEMIA  . OTITIS MEDIA, ACUTE, RIGHT  . ALLERGIC RHINITIS  . GERD  . VAGINITIS  . GLUCOSE INTOLERANCE, HX OF  . Obesity (BMI 30.0-34.9)    Past Medical History  Diagnosis Date  . Kidney stones   . Endometriosis   . Hyperlipidemia     Past Surgical History  Procedure Date  . Cholecystectomy   . Abdominal hysterectomy   . Oophrectomy 2011    secondary to cysts  . Carpel tunnel   . Arthroscopic knee surgery    . Hip surgery     History  Substance Use Topics  . Smoking status: Never Smoker   . Smokeless tobacco: Never Used  . Alcohol Use: No    Family History  Problem Relation Age of Onset  . Hypertension Other     No Known Allergies  Medication list has been reviewed and updated.  Current Outpatient Prescriptions on File Prior to Visit  Medication Sig Dispense Refill  . ergocalciferol (DRISDOL) 50000 UNITS  capsule Take 1 capsule (50,000 Units total) by mouth once a week.  4 capsule  5  . AMBULATORY NON FORMULARY MEDICATION Place 1 capsule vaginally once a week. Medication Name: Boric Acid 600mg  suppository  30 capsule  1  . predniSONE (DELTASONE) 20 MG tablet Take 3 tablets daily for 2 days, then 2 tablets daily for 2 days, then one daily for 2 days  12 tablet  0  . triamcinolone cream (KENALOG) 0.1 % Apply topically 2 (two) times daily.  30 g  1  . valACYclovir (VALTREX) 1000 MG tablet 2 po at outbreak onset then repeat in 12h for each outbreak  30 tablet  0    Review of Systems:  As per HPI- otherwise negative.   Physical Examination: Filed Vitals:   01/02/12 1301  BP: 133/85  Pulse: 105  Temp: 98.8 F (37.1 C)  Resp: 18   Filed Vitals:   01/02/12 1301  Height: 5' 4.5" (1.638 m)  Weight: 184 lb (83.462 kg)   Body mass index is 31.10 kg/(m^2). Ideal Body Weight: Weight in (lb) to have BMI = 25: 147.6   GEN: WDWN, NAD, Non-toxic, A & O x 3.  Miserable appearing, rocking with pain HEENT: Atraumatic, Normocephalic. Neck supple. No masses,  No LAD. Ears and Nose: No external deformity. CV: RRR, No M/G/R. No JVD. No thrill. No extra heart sounds. PULM: CTA B, no wheezes, crackles, rhonchi. No retractions. No resp. distress. No accessory muscle use. ABD: S, +BS. No rebound. No HSM.  She has epigastric tenderness, and mild generalized tenderness.  She has bowel sounds but they are diminished.   EXTR: No c/c/e NEURO Normal gait.  PSYCH: Normally interactive. Conversant. Not depressed or anxious appearing.  Calm demeanor.   Results for orders placed in visit on 01/02/12  POCT URINALYSIS DIPSTICK      Component Value Range   Color, UA yellow     Clarity, UA hazy     Glucose, UA neg     Bilirubin, UA neg     Ketones, UA neg     Spec Grav, UA 1.025     Blood, UA neg     pH, UA 6.5     Protein, UA neg     Urobilinogen, UA 0.2     Nitrite, UA neg     Leukocytes, UA Trace    POCT  UA - MICROSCOPIC ONLY      Component Value Range   WBC, Ur, HPF, POC 20-28     RBC, urine, microscopic 1-3     Bacteria, U Microscopic trace     Mucus, UA trace     Epithelial cells, urine per micros 4-6     Crystals, Ur, HPF, POC neg     Casts, Ur, LPF, POC neg     Yeast, UA neg    POCT CBC      Component Value Range   WBC 10.4 (*) 4.6 - 10.2 K/uL   Lymph, poc 1.5  0.6 - 3.4   POC LYMPH PERCENT 14.4  10 - 50 %L   MID (cbc) 0.7  0 - 0.9   POC MID % 7.1  0 - 12 %M   POC Granulocyte 8.2 (*) 2 - 6.9   Granulocyte percent 78.5  37 - 80 %G   RBC 4.92  4.04 - 5.48 M/uL   Hemoglobin 14.7  12.2 - 16.2 g/dL   HCT, POC 16.1  09.6 - 47.9 %   MCV 91.7  80 - 97 fL   MCH, POC 29.9  27 - 31.2 pg   MCHC 32.6  31.8 - 35.4 g/dL   RDW, POC 04.5     Platelet Count, POC 323  142 - 424 K/uL   MPV 8.7  0 - 99.8 fL   EKG: NSR, no ST elevation or depression   [facility] reading (PRIMARY) by  Dr. Patsy Lager.  abdominal series: no free air, but evidence of a small bowel obstruction.    Gave a GI cocktail- this did not help at all.   zofran 8mg  ODT once at 2pm- did not help.   Then performed abdominal series which revealed diagnosis   Assessment and Plan: 1. URI (upper respiratory infection)    2. Abdominal  pain, other specified site  POCT urinalysis dipstick, POCT UA - Microscopic Only, POCT CBC, gi cocktail (Maalox,Lidocaine,Donnatal), EKG 12-Lead, DG Abd Acute W/Chest  3. Small bowel obstruction     Jasmarie has a likely SBO today.  Her husband will drive her to the hospital where she will be admitted to the [facility] service. Appreciate care by the [facility] service.    Abbe Amsterdam, MD

## 2012-01-02 NOTE — H&P (Addendum)
Family Medicine Teaching Service Hospital Admission Note Date: 01/02/2012  Patient name: Kendra Gonzalez Medical record number: [id number] Date of birth: February 17, 1971 Age: 41 y.o. Gender: female PCP: Benny Lennert, PA-C  Medical Service: Family Medicine Teaching Service  Chief Complaint: epigastric abdominal pain  History of Present Illness: Kendra Gonzalez is a 41 year old woman with a PMH significant for allergic rhinitis, ovarian cysts and endometriosis s/p hysterectomy and oophorectomy, and cholelithiasis s/p cholecystectomy who presented to [facility] because of sinus problems and abdominal pain that awoke her from sleep this morning.  She states that she has never had any pain like this before.  The pain is a stabbing pain in the epigastric area and does not radiate to the back or anywhere else in the abdomen.  She has been mildly nauseated but has not vomited and she states that she had a BM this morning that was normal for her.  She denies fevers, chills, constipation, diarrhea, chest pain, dizziness, or diaphoresis.    She states that since Monday she has been dealing with pressure and drainage both from the nose as well as down the back of her throat.  She denies cough, fever, chills, itchy watery eyes, or itching.  She states that she has problems with her sinuses usually in the spring and fall.  Drainage from the nose was light green.   Meds: Patient take no home medications.   Allergies: Allergies as of 01/02/2012  . (No Known Allergies)   Past Medical History  Diagnosis Date  . Kidney stones   . Endometriosis   . Hyperlipidemia    Past Surgical History  Procedure Date  . Cholecystectomy   . Abdominal hysterectomy   . Oophrectomy 2011    secondary to cysts  . Carpel tunnel   . Arthroscopic knee surgery    . Hip surgery    Family History  Problem Relation Age of Onset  . Hypertension Other    History   Social History  . Marital Status: Married    Spouse Name: N/A    Number of Children: N/A  . Years of Education: N/A   Occupational History  . Not on file.   Social History Main Topics  . Smoking status: Never Smoker   . Smokeless tobacco: Never Used  . Alcohol Use: No  . Drug Use: No  . Sexually Active: Not on file   Other Topics Concern  . Not on file   Social History Narrative   Works as a Radiation protection practitioner.  Married with 27 child, 27 year old son.   Review of Systems: Constitutional: Denies fever, chills, diaphoresis, appetite change and fatigue.  HEENT: Positive for congestion, sore throat, rhinorrhea, sneezing.  Denies photophobia, eye pain, redness, hearing loss, ear pain,  mouth sores, trouble swallowing, neck pain, neck stiffness and tinnitus.   Respiratory: Denies SOB, DOE, cough, chest tightness,  and wheezing.   Cardiovascular: Denies chest pain, palpitations and leg swelling.  Gastrointestinal: Positive for nausea, and abdominal pain.  Denies vomiting, diarrhea, constipation, blood in stool and abdominal distention.  Genitourinary: Denies dysuria, urgency, frequency, hematuria, flank pain and difficulty urinating.  Musculoskeletal: Denies myalgias, back pain, joint swelling, arthralgias and gait problem.  Skin: Denies pallor, rash and wound.  Neurological: Denies dizziness, seizures, syncope, weakness, light-headedness, numbness and headaches.  Hematological: Denies adenopathy. Easy bruising, personal or family bleeding history  Psychiatric/Behavioral: Denies suicidal ideation, mood changes, confusion, nervousness, sleep disturbance and agitation  Physical Exam: Vital  signs from [facility]: BP:  133/85   Pulse:  105   Temp:  98.8 F (37.1 C)   Resp:  18    Filed Vitals:    01/02/12 1301   Height:  5' 4.5" (1.638 m)   Weight:  184 lb (83.462 kg)    Constitutional: Vital signs reviewed.  Patient is a well-developed and well-nourished woman in moderate acute distress from pain.  Holding her abdomen.   Cooperative with exam. Alert and oriented x3.  Head: Normocephalic and atraumatic Ear: TM normal bilaterally Mouth: Mild erythema noted in the posterior pharynx, no exudates, MMM Eyes: PERRL, EOMI, conjunctivae normal, No scleral icterus.  Neck: Supple, Trachea midline normal ROM, No JVD, mass, thyromegaly, or carotid bruit present.  Cardiovascular: RRR, S1 normal, S2 normal, no MRG, pulses symmetric and intact bilaterally Pulmonary/Chest: CTAB, no wheezes, rales, or rhonchi Abdominal: Soft. Moderate epigastric tenderness, non-distended, bowel sounds are hypoactive, no masses, organomegaly, or guarding present.  GU: no CVA tenderness Musculoskeletal: No joint deformities, erythema, or stiffness, ROM full and no nontender Hematology: no cervical, inginal, or axillary adenopathy.  Neurological: A&O x3, Strength is normal and symmetric bilaterally, cranial nerve II-XII are grossly intact, no focal motor deficit, sensory intact to light touch bilaterally.  Skin: Warm, dry and intact. No rash, cyanosis, or clubbing.  Psychiatric: Normal mood and affect. speech and behavior is normal. Judgment and thought content normal. Cognition and memory are normal.   Lab results: CBC:  Basename 01/02/12 1346  WBC 10.4*  NEUTROABS --  HGB 14.7  HCT 45.1  MCV 91.7  PLT --   Urinalysis:  Basename 01/02/12 1346  COLORURINE --  LABSPEC --  PHURINE --  GLUCOSEU --  HGBUR --  BILIRUBINUR neg  KETONESUR --  PROTEINUR --  UROBILINOGEN 0.2  NITRITE neg  LEUKOCYTESUR Trace   Imaging results:  No results found.  Assessment & Plan by Problem: 1. Epigastric Abdominal pain: Kendra Gonzalez presents with a history of epigastric pain since this morning.  She has a mild increased WBC with a neutrophilia today.  She does not have an acute abdomen.  Abdominal series done at [facility] shows a mildly dilated loop of what appears to be large bowel as well as mildly dilated small bowel and air fluid levels.  She has a  history of several intra-abdominal surgeries which raises the risk of small bowel obstruction but she has had a BM this morning and is passing gas.  She has a history of cholelithiasis as well which raises the possibility of retained stone or recurrent choledocholithiasis. SHe doesn't have an acute abdomen and no free air so perforated viscous is less likely.  She also has not had bloody diarrhea which makes ischemic bowel less likely as well.   - Admit to med-surg  - NPO  - Cmet, Lipase, and lactic acid STAT  - Consider NG tube tonight after labs   - Morphine for pain  - Zofran for nausea.   2. Allergic Rhinitis:  The patient has a history of allergic rhinitis and has some sinus congestion as well as post nasal drip.  We will start with saline nasal spray.  If this continues to cause an issue we could consider nasal steroids or antihistamine.   3. VTE: SCDs  Signed: PRIBULA,CHRISTOPHER 01/02/2012, 4:23 PM    9:35 PM I agree with HPI/GPe and A/P per Dr. Reesa Chew. was in the room with him when he was examining him and targeting the patient and agree with his  physical exam. Differential diagnosis broad includes choledocholithiasis, severe gastroesophageal reflux, pancreatitis, unlikely mesenteric ischemia given no risk factors such as A. fib and her age precludes severe atherosclerosis-we will follow up lab work as well a CT scan that has been ordered stat and subsequently consult appropriate surgeon/gastroenterologist      Unlikely that she has gynecological issues given location of pain-detailed sexual history needs to be taken. I do not get the feeling that she has any melena or any PID given the limited history that I was able to elicit.  Pleas Koch, MDt (P) [phone]

## 2012-01-02 NOTE — Patient Instructions (Addendum)
Go to [facility] to Admitting- on the main floor. You can ask at the visitor's entrance or at the ED for direction.  You are going to be admitted to the [facility]

## 2012-01-02 NOTE — Progress Notes (Signed)
Patient did not get her CT scan of her abdomen until 10 PM . I went to evaluate the patient . She was still complaining of abdominal pain -  Located in teh epigastric region. Moprhine did not help her much. Denies any nausea or vomiting. She is passing flatus. On exam, her abdomen was soft, tender to palpation in the  Epigastrium and LUQ, no guarding or rigidity. Her labs were reviewed- lipase and lactic acid WNL.   Plan: Talked to the nurse expedite the process. F/u results.  Update: patient just completed her scan.

## 2012-01-03 ENCOUNTER — Inpatient Hospital Stay (HOSPITAL_COMMUNITY): Payer: 59

## 2012-01-03 LAB — COMPREHENSIVE METABOLIC PANEL
AST: 434 U/L — ABNORMAL HIGH (ref 0–37)
Albumin: 3.5 g/dL (ref 3.5–5.2)
Alkaline Phosphatase: 142 U/L — ABNORMAL HIGH (ref 39–117)
BUN: 6 mg/dL (ref 6–23)
BUN: 5 mg/dL — ABNORMAL LOW (ref 6–23)
CO2: 25 mEq/L (ref 19–32)
Calcium: 9.7 mg/dL (ref 8.4–10.5)
Chloride: 101 mEq/L (ref 96–112)
Creatinine, Ser: 0.83 mg/dL (ref 0.50–1.10)
Creatinine, Ser: 0.77 mg/dL (ref 0.50–1.10)
GFR calc Af Amer: >90 mL/min (ref >90)
GFR calc non Af Amer: 86 mL/min — ABNORMAL LOW (ref >90)
GFR calc non Af Amer: >90 mL/min (ref >90)
Glucose, Bld: 110 mg/dL — ABNORMAL HIGH (ref 70–99)
Total Bilirubin: 1 mg/dL (ref 0.3–1.2)

## 2012-01-03 LAB — LIPASE, BLOOD: Lipase: 20 U/L (ref 11–59)

## 2012-01-03 LAB — ACETAMINOPHEN LEVEL: Acetaminophen (Tylenol), Serum: <15 ug/mL (ref 10–30)

## 2012-01-03 LAB — CBC
Platelets: 250 10*3/uL (ref 150–400)
RBC: 4.25 MIL/uL (ref 3.87–5.11)
RDW: 13 % (ref 11.5–15.5)
WBC: 6.7 10*3/uL (ref 4.0–10.5)

## 2012-01-03 MED ORDER — GI COCKTAIL ~~LOC~~
30.0000 mL | Freq: Once | ORAL | Status: DC
  Filled 2012-01-03: qty 30

## 2012-01-03 MED ORDER — IBUPROFEN 600 MG PO TABS
600.0000 mg | ORAL_TABLET | Freq: Four times a day (QID) | ORAL | Status: DC | PRN
  Administered 2012-01-04: 600 mg via ORAL
  Filled 2012-01-03 (×2): qty 1

## 2012-01-03 NOTE — Progress Notes (Signed)
Seen and agree. Obscure etiology.  F/u labs and potentially delineation of trajectory towards d/c home or further work-up  Pleas Koch, MD (P) [phone]

## 2012-01-03 NOTE — Progress Notes (Signed)
Subjective: Patient's abdominal pain is much improved today, she has not been using the morphine because it was too strong.  She has only had ice chips and 1 french fry from her husband.  She had a BM this morning and has been passing gas all day.    Objective: Vital signs in last 24 hours: Filed Vitals:   01/02/12 1540 01/02/12 1700 01/02/12 2142 01/03/12 0513  BP: 131/72  131/94 101/58  Pulse: 95  119 96  Temp: 98.9 F (37.2 C)  98.9 F (37.2 C) 98.4 F (36.9 C)  TempSrc: Oral  Oral Oral  Resp: 18  18 18   Height: 5\' 4"  (1.626 m) 5' 4.5" (1.638 m)    Weight: 184 lb (83.462 kg) 184 lb (83.462 kg)    SpO2: 100%  100% 100%   Weight change:   Intake/Output Summary (Last 24 hours) at 01/03/12 1507 Last data filed at 01/03/12 0513  Gross per 24 hour  Intake      0 ml  Output      3 ml  Net     -3 ml   Vitals reviewed. General: resting in bed, NAD HEENT: PERRL, EOMI, no scleral icterus Cardiac: RRR, no rubs, murmurs or gallops Pulm: clear to auscultation bilaterally, no wheezes, rales, or rhonchi Abd: soft, mild tenderness in the epigastric area to palpation.  No pain with percussion, nondistended, BS present Ext: warm and well perfused, no pedal edema Neuro: alert and oriented X3, cranial nerves II-XII grossly intact, strength and sensation to light touch equal in bilateral upper and lower extremities  Lab Results: Basic Metabolic Panel:  Lab 01/03/12 1478 01/02/12 1638  NA 134* 134*  K 4.1 4.3  CL 101 99  CO2 25 26  GLUCOSE 110* 92  BUN 6 9  CREATININE 0.83 0.83  CALCIUM 9.6 10.3  MG -- --  PHOS -- --   Liver Function Tests:  Lab 01/03/12 0630 01/02/12 1638  AST 832* 25  ALT 486* 27  ALKPHOS 142* 74  BILITOT 1.0 0.8  PROT 7.0 8.3  ALBUMIN 3.4* 4.2    Lab 01/02/12 1638  LIPASE 16  AMYLASE --   CBC:  Lab 01/03/12 0630 01/02/12 1346  WBC 6.7 10.4*  NEUTROABS -- --  HGB 12.7 14.7  HCT 36.7 45.1  MCV 86.4 91.7  PLT 250 --   Hemoglobin A1C:  Lab  01/02/12 1638  HGBA1C 5.6   Urinalysis:  Lab 01/02/12 1346  COLORURINE --  LABSPEC --  PHURINE --  GLUCOSEU --  HGBUR --  BILIRUBINUR neg  KETONESUR --  PROTEINUR --  UROBILINOGEN 0.2  NITRITE neg  LEUKOCYTESUR Trace   Micro Results: No results found for this or any previous visit (from the past 240 hour(s)). Studies/Results: US Abdomen Complete  01/03/2012  *RADIOLOGY REPORT*  Clinical Data:  Abdominal pain and increased LFTs.  History of cholecystectomy.  ABDOMINAL ULTRASOUND COMPLETE  Comparison:  CT abdomen pelvis 01/02/2012  Findings:  Gallbladder:  Surgically absent  Common Bile Duct:  Within normal limits in caliber.  Measures 4 mm.  Liver: No focal mass lesion identified.  Within normal limits in parenchymal echogenicity. Slight prominence of the central intrahepatic ducts, consistent with prior cholecystectomy.  IVC:  Appears normal.  Pancreas:  The pancreas was not visualized, due to prominent bowel gas in this region (the patient had eaten immediately prior to the examination).  Spleen:  Within normal limits in size and echotexture. Measures 4 cm in sagittal length.  Right  kidney:  Normal in size and parenchymal echogenicity.  No evidence of mass or hydronephrosis.  Left kidney:  Normal in size and parenchymal echogenicity.  No evidence of mass or hydronephrosis.  Abdominal Aorta:  No aneurysm identified.  IMPRESSION: 1.  Prior cholecystectomy. 2.  Slight prominence of central intrahepatic bile ducts, consistent with prior cholecystectomy.  The common bile duct is normal in caliber. 3.  No explanation for patient pain identified.   Original Report Authenticated By: Britta Mccreedy, M.D.    Ct Abdomen Pelvis W Contrast  01/02/2012  *RADIOLOGY REPORT*  Clinical Data: Stabbing epigastric abdominal pain.  Mild to nausea.  CT ABDOMEN AND PELVIS WITH CONTRAST  Technique:  Multidetector CT imaging of the abdomen and pelvis was performed following the standard protocol during bolus  administration of intravenous contrast.  Contrast: OMNIPAQUE IOHEXOL 300 MG/ML  SOLN  Comparison: 02/20/2009.  Findings: Mild dependent atelectasis in the lung bases.  Surgical absence of the gallbladder.  Mild intra and extrahepatic bile duct dilatation, likely postoperative.  The liver, spleen, pancreas, renal glands, abdominal aorta, and retroperitoneal lymph nodes are unremarkable.  5 mm stone in the midportion of the right kidney without obstruction.  Kidneys are otherwise unremarkable.  Stool filled colon without significant distension.  Small bowel and stomach are not distended.  No free air or free fluid in the abdomen.  Pelvis:  Uterus is surgically absent.  No abnormal adnexal masses. No free or loculated pelvic fluid collections.  No significant pelvic lymphadenopathy.  No diverticulitis.  Appendix is normal. Abdominal wall appears intact.  Normal alignment of the lumbar vertebrae.  IMPRESSION: Nonobstructing right renal stone.  Prior cholecystectomy with postoperative bile duct dilatation.  No acute process demonstrated in the abdomen or pelvis.  No evidence of bowel obstruction.   Original Report Authenticated By: Marlon Pel, M.D.    Dg Abd Acute W/chest  01/02/2012  *RADIOLOGY REPORT*  Clinical Data: Epigastric pain.  ACUTE ABDOMEN SERIES (ABDOMEN 2 VIEW & CHEST 1 VIEW)  Comparison: Acute abdominal series 07/13/2011.  Findings: Lung volumes are normal.  No consolidative airspace disease.  No pleural effusions.  No pneumothorax.  No pulmonary nodule or mass noted.  Pulmonary vasculature and the cardiomediastinal silhouette are within normal limits.  Supine and upright views of the abdomen demonstrates a large loop of gas-filled bowel projecting over the right upper quadrant, favored to represent a portion of the colon.  There is some distal colonic gas, but there are multiple loops of dilated gas filled small bowel measuring up to approximately 3.8 cm in diameter in the left upper  quadrant of the abdomen.  No pneumoperitoneum.  Small air fluid levels are noted on the upright projection.  IMPRESSION: 1.  Findings, as above, concerning for early or partial small bowel obstruction. Alternatively, given the focally dilated loop of colon, the findings may simply reflect an ileus, however, the appearance is highly unusual.  For further evaluation of all of these findings, contrast enhanced CT of the abdomen and pelvis may provide additional diagnostic information. 2.  No radiographic evidence of acute cardiopulmonary disease.  Clinically significant discrepancy from primary report, if provided: None   Original Report Authenticated By: Florencia Reasons, M.D.    Medications: I have reviewed the patient's current medications. Scheduled Meds:   . iohexol  20 mL Oral Q1 Hr x 2   Continuous Infusions:   . dextrose 5 % and 0.45 % NaCl with KCl 20 mEq/L 125 mL/hr at 01/03/12 1402  PRN Meds:.ibuprofen, iohexol, sodium chloride, DISCONTD:  morphine injection, DISCONTD:  morphine injection Assessment/Plan: 1. Abdominal pain: Ms. Fogal presented with one day of abdominal pain on admission.  Differential diagnosis includes choledocholithiasis, partial SBO, pancreatitis, or severe GERD.  She was made NPO and Cmet and lipase were initially negative.  CT scan of the abdomen and pelvis showed a non-obstructing right renal stone and mild bile duct dilatation that was felt to be within normal limits post cholecystectomy.  On the date after admission her LFTs had increased though her abdominal pain was vastly improved.  RUQ ultrasounds was normal with no sign of a retained gallstone or any sign of pancreatitis though mildly obscured by bowel gas.  It is possible that this was a temporarily obstructing gallstone that has since cleared.    - Recheck Cmet and Lipase this afternoon  - If those are normal we will start a clear liquid diet and monitor her abdominal pain  - If she tolerates this well we  will advance her diet and have her follow up with GI as an outpatient.   - If she has an increase in abdominal pain then we will drop her back to NPO and consult GI to see her during this hospitalization.  We could also consider an MRCP.  2.  Allergic rhinitis: On admission she had been complaining of some sinus congestion and post nasal drip.  Today she is doing well and has no complaints.  We will continue to monitor.    3.  Dispo: If labs are trending downward we will start feeding her.  She will be ready for discharge when she is able to tolerate diet without increased pain.    LOS: 1 day   Elayjah Chaney 01/03/2012, 3:07 PM

## 2012-01-04 LAB — COMPREHENSIVE METABOLIC PANEL
ALT: 320 U/L — ABNORMAL HIGH (ref 0–35)
Albumin: 3.4 g/dL — ABNORMAL LOW (ref 3.5–5.2)
Alkaline Phosphatase: 120 U/L — ABNORMAL HIGH (ref 39–117)
BUN: 6 mg/dL (ref 6–23)
Chloride: 102 mEq/L (ref 96–112)
Potassium: 4.2 mEq/L (ref 3.5–5.1)
Sodium: 136 mEq/L (ref 135–145)
Total Bilirubin: 0.4 mg/dL (ref 0.3–1.2)
Total Protein: 6.8 g/dL (ref 6.0–8.3)

## 2012-01-04 LAB — HEPATIC FUNCTION PANEL
ALT: 311 U/L — ABNORMAL HIGH (ref 0–35)
Alkaline Phosphatase: 130 U/L — ABNORMAL HIGH (ref 39–117)
Bilirubin, Direct: <0.1 mg/dL (ref 0.0–0.3)
Total Bilirubin: 0.4 mg/dL (ref 0.3–1.2)
Total Protein: 7.9 g/dL (ref 6.0–8.3)

## 2012-01-04 LAB — CBC
HCT: 36.2 % (ref 36.0–46.0)
Hemoglobin: 12.4 g/dL (ref 12.0–15.0)
MCHC: 34.3 g/dL (ref 30.0–36.0)
RDW: 13.3 % (ref 11.5–15.5)
WBC: 5.1 10*3/uL (ref 4.0–10.5)

## 2012-01-04 LAB — HEPATITIS PANEL, ACUTE
Hep A IgM: NEGATIVE
Hep B C IgM: NEGATIVE
Hepatitis B Surface Ag: NEGATIVE

## 2012-01-04 MED ORDER — PANTOPRAZOLE SODIUM 40 MG PO TBEC
40.0000 mg | DELAYED_RELEASE_TABLET | Freq: Every day | ORAL | Status: DC
  Administered 2012-01-04: 40 mg via ORAL
  Filled 2012-01-04: qty 1

## 2012-01-04 MED ORDER — PANTOPRAZOLE SODIUM 40 MG PO TBEC: 40.0000 mg | DELAYED_RELEASE_TABLET | Freq: Every day | ORAL | Status: DC

## 2012-01-04 MED ORDER — FLUCONAZOLE 150 MG PO TABS
150.0000 mg | ORAL_TABLET | Freq: Once | ORAL | Status: AC
  Administered 2012-01-04: 150 mg via ORAL
  Filled 2012-01-04: qty 1

## 2012-01-04 NOTE — Discharge Summary (Signed)
Physician Discharge Summary  Patient ID: Kendra Gonzalez MRN: [id number] DOB/AGE: 10-18-1970 41 y.o.  Admit date: 01/02/2012 Discharge date: 01/04/2012  Admission Diagnoses: Abdominal pain Discharge Diagnoses:  Principal Problem:  *Abdominal pain, acute Active Problems:  ALLERGIC RHINITIS   Discharged Condition: good  Hospital Course:  1. Abdominal pain: 41 yo female with PMH of allergic rhinitis, ovarian cysts and endometriosis s/p hysterectomyand oophorectomy, and cholelithiasis s/p cholecystectomy who presented with severe abdominal pain. An abdominal xray was concerning for a small bowel obstruction, but the patient continued to have flatus and bowel movements, and an abd ct was negative for obstruction. Her LFT's on admission were normal but on hospital day 1, she had increased LFTs. Her LFTs trended down and an abdominal ultrasound showed no abnormalities. Clinically her abdominal pain improved. She was made NPO and her diet was advanced to clear foods. The patient complained of heartburn symptoms which were treated with a GI cocktail. She was able to tolerate bland foods on hospital day 2, no longer complained of abdominal pain, and was discharged to home. She was given a prescription for protonix for her heartburn.  Because of her increased LFTs, an acute hepatitis panel was drawn but the results were not back at the time of discharge.  2. Hypokalemia: Patient found to have K of 3.4 on admission, was given K in her IVF then given oral K.  3. Vaginal candidiasis: Patient reported symptoms of a vaginal yeast infection. She was treated with one PO dose of fluconazole.    Consults: none  Significant Diagnostic Studies:  Abdominal ultrasound: IMPRESSION:  1. Prior cholecystectomy.  2. Slight prominence of central intrahepatic bile ducts,  consistent with prior cholecystectomy. The common bile duct is  normal in caliber.  3. No explanation for patient pain identified.  CT  abd/pelvis w/ contrast: IMPRESSION:  Nonobstructing right renal stone. Prior cholecystectomy with  postoperative bile duct dilatation. No acute process demonstrated  in the abdomen or pelvis. No evidence of bowel obstruction.    Treatments:  Pain initially treated with morphine but patient no longer required pain medications on hosp day 1. Fluconazole Discharge Exam: Filed Vitals:   01/04/12 0548  BP: 99/53  Pulse: 82  Temp: 98.5 F (36.9 C)  Resp: 18   Physical exam: General: sitting in chair, NAD  HEENT: PERRL, EOMI, no scleral icterus  Cardiac: RRR, no rubs, murmurs or gallops  Pulm: clear to auscultation bilaterally, no wheezes, rales, or rhonchi  Abd: soft, mild tenderness in the epigastric area to palpation. No pain with percussion, nondistended, BS present  Ext: warm and well perfused, no pedal edema  Neuro: alert and oriented X3   Disposition: 01-Home or Self Care  Medication List  As of 01/04/2012  4:12 PM   TAKE these medications         magnesium gluconate 500 MG tablet   Commonly known as: MAGONATE   Take 500 mg by mouth at bedtime.      pantoprazole 40 MG tablet   Commonly known as: PROTONIX   Take 1 tablet (40 mg total) by mouth daily at 12 noon.           Follow-up issues: 1. Follow-up hepatitis panel. 2. Pt to follow up at [facility] in 1 week.  Signed: Myrtha Mantis, MSIV 01/04/2012, 12:15 PM  PGY3 Addendum of MSIV note.  I have reviewed and edited the above discharge summary and agree with above.

## 2012-01-04 NOTE — Progress Notes (Addendum)
Family Medicine Teaching Service Inpatient Progress Note  Subjective: Pt's abdominal pain continues to improve. She reports that she had heartburn last night, but it was helped by the GI cocktail we gave her. She reports that her stomach started to hurt after she ate some graham crackers during the night, but it is feeling better today. She is eating clear foods this morning (broth and jello). She reports that she has been passing gas all day. The patient also would like some medication for a yeast infection.   Objective: Vital signs in last 24 hours: Filed Vitals:   01/03/12 0513 01/03/12 1425 01/03/12 2150 01/04/12 0548  BP: 101/58 97/51 95/48  99/53  Pulse: 96 88 91 82  Temp: 98.4 F (36.9 C) 99 F (37.2 C) 98.4 F (36.9 C) 98.5 F (36.9 C)  TempSrc: Oral Oral Oral Oral  Resp: 18 18 18 18   Height:      Weight:      SpO2: 100% 100% 100% 100%   Weight change:   Intake/Output Summary (Last 24 hours) at 01/04/12 0847 Last data filed at 01/04/12 0548  Gross per 24 hour  Intake   1404 ml  Output      9 ml  Net   1395 ml    General: sitting in chair, NAD HEENT: PERRL, EOMI, no scleral icterus Cardiac: RRR, no rubs, murmurs or gallops Pulm: clear to auscultation bilaterally, no wheezes, rales, or rhonchi Abd: soft, mild tenderness in the epigastric area to palpation.  No pain with percussion, nondistended, BS present Ext: warm and well perfused, no pedal edema Neuro: alert and oriented X3 Lab Results: Basic Metabolic Panel:  Lab 01/04/12 4098 01/03/12 1529  NA 136 135  K 4.2 4.3  CL 102 102  CO2 26 27  GLUCOSE 98 100*  BUN 6 5*  CREATININE 0.76 0.77  CALCIUM 9.6 9.7  MG -- --  PHOS -- --   Liver Function Tests:  Lab 01/04/12 0600 01/03/12 1529  AST 188* 434*  ALT 320* 431*  ALKPHOS 120* 132*  BILITOT 0.4 0.6  PROT 6.8 7.1  ALBUMIN 3.4* 3.5    Lab 01/03/12 1529 01/02/12 1638  LIPASE 20 16  AMYLASE -- --   CBC:  Lab 01/04/12 0600 01/03/12 0630  WBC 5.1  6.7  NEUTROABS -- --  HGB 12.4 12.7  HCT 36.2 36.7  MCV 87.4 86.4  PLT 244 250   Hemoglobin A1C:  Lab 01/02/12 1638  HGBA1C 5.6   Urinalysis:  Lab 01/02/12 1346  COLORURINE --  LABSPEC --  PHURINE --  GLUCOSEU --  HGBUR --  BILIRUBINUR neg  KETONESUR --  PROTEINUR --  UROBILINOGEN 0.2  NITRITE neg  LEUKOCYTESUR Trace   Studies/Results: US Abdomen Complete  01/03/2012    IMPRESSION: 1.  Prior cholecystectomy. 2.  Slight prominence of central intrahepatic bile ducts, consistent with prior cholecystectomy.  The common bile duct is normal in caliber. 3.  No explanation for patient pain identified.   Original Report Authenticated By: Britta Mccreedy, M.D.    Ct Abdomen Pelvis W Contrast 01/02/2012 IMPRESSION: Nonobstructing right renal stone.  Prior cholecystectomy with postoperative bile duct dilatation.  No acute process demonstrated in the abdomen or pelvis.  No evidence of bowel obstruction.   Original Report Authenticated By: Marlon Pel, M.D.    Dg Abd Acute W/chest  9/7/2013IMPRESSION: 1.  Findings, as above, concerning for early or partial small bowel obstruction. Alternatively, given the focally dilated loop of colon, the findings may  simply reflect an ileus, however, the appearance is highly unusual.  For further evaluation of all of these findings, contrast enhanced CT of the abdomen and pelvis may provide additional diagnostic information. 2.  No radiographic evidence of acute cardiopulmonary disease.  Clinically significant discrepancy from primary report, if provided: None   Original Report Authenticated By: Florencia Reasons, M.D.    Medications: I have reviewed the patient's current medications. Scheduled Meds:    . gi cocktail  30 mL Oral Once   Continuous Infusions:    . dextrose 5 % and 0.45 % NaCl with KCl 20 mEq/L 125 mL/hr at 01/03/12 1402  p PRN Meds:.ibuprofen, sodium chloride, DISCONTD:  morphine injection  Assessment/Plan: Pt is 41 yo female  s/p cholecystecomy who presented with mid-epigastric abdominal pain.   1. Abdominal pain: Ms. Cyphers presented with one day of abdominal pain on admission on 9/7. She is s/p cholecystomy. Her Cmet and lipase were normal at admission, and a CT scan of the abdomen and pelvis showed a non-obstructing right renal stone and mild bile duct dilatation that was felt to be within normal limits post cholecystectomy.  On the date after admission her LFTs had increased though her abdominal pain was vastly improved.  RUQ ultrasound was normal with no sign of a retained gallstone or any sign of pancreatitis though mildly obscured by bowel gas.  It is possible that this was a temporarily obstructing gallstone that has since cleared.  LFTs continue to improve today, and the patient feels well. Lipase has remained normal.  - Advance diet from clears to bland foods and monitor her abdominal pain.  -If she is able to tolerate bland diet, patient can be discharged home later today.   -If she has an increase in abdominal pain then we will drop her back to NPO and consult GI to see her during this hospitalization.  We could also consider an MRCP.  -the GI cocktail helped the patient's new complaints of heartburn. Consider starting PPI for GERD.  2. Vaginal candidiasis: Pt complaining of a yeast infection today. Will give one time dose of fluconazole before discharging her.  3.  Allergic rhinitis: On admission she had been complaining of some sinus congestion and post nasal drip.  She has no complaints of this today. We will continue to monitor.    4.  Dispo: If patient is able to tolerate a bland diet today, she will be discharged to home.     LOS: 2 days   Myrtha Mantis, [facility] 01/04/2012, 8:47 AM  Teaching Service Addendum. I have seen and evaluated this pt and agree with MS note. My addended note is as follows.  Physical exam: Filed Vitals:   01/04/12 1413  BP: 111/64  Pulse: 83  Temp: 99.2 F (37.3 C)    Resp: 18   Gen:  No in acute distress. Cooperative with physical exam. HEENT: Moist mucous membranes. CV: Regular rate and rhythm, no murmurs rubs or gallops. PULM: Clear to auscultation bilaterally. No wheezes/rales or rhonchi ABD: Soft, non tender, non distended, normal bowel sounds.  EXT: no edema Neuro: Grossly intact. No neurologic focalization.   Assessment and Plan: TAVARIA SPERBECK is a 41 y.o.  female with hx of Cholecystectomy admitted for epigastric pain  And found to have elevated transaminases  1. Epigastric pain: due to nature of pain and relation to transitory elevations of transaminases, most likely common bile duct stone that she had passed. Hepatitis can't be r/o but less likely due  to her quickly decrease on her transaminases.  - PPI - Hep viral panel - GIconsult if not improvement to evaluate for possible more studies ( MRCP vs ERCP)  FEN/GI: advance diet as tolerated.  Prophylaxis: SCD's Disposition: possible discharge today in the afternoon if pt is tolerating PO adequately and pain free.   D. Piloto Rolene Arbour, MD Family Medicine  PGY-2    Seen and examined.  Discussed with Dr. Aviva Signs.  Agree with her management.  Briefly 41 yo female with severe epigastric pain and elevated LFTs, both of which are improving.  Our team agrees that a CBD stone is the likely cause and has likely passed.  For completeness, we will check an acute hepatitis panel.  If tolerates PO well, we can complete WU as an outpatient.

## 2012-01-05 LAB — HEPATITIS PANEL, ACUTE: Hepatitis B Surface Ag: NEGATIVE

## 2012-01-07 ENCOUNTER — Encounter (HOSPITAL_COMMUNITY): Payer: Self-pay | Admitting: *Deleted

## 2012-01-07 ENCOUNTER — Observation Stay (HOSPITAL_COMMUNITY)
Admission: AD | Admit: 2012-01-07 | Discharge: 2012-01-11 | Disposition: A | Discharge status: Home / Self Care | Payer: 59 | Source: Ambulatory Visit | Attending: Family Medicine | Admitting: Family Medicine

## 2012-01-07 DIAGNOSIS — B3731 Acute candidiasis of vulva and vagina: Secondary | ICD-10-CM

## 2012-01-07 DIAGNOSIS — E66811 Obesity, class 1: Secondary | ICD-10-CM

## 2012-01-07 DIAGNOSIS — I10 Essential (primary) hypertension: Secondary | ICD-10-CM

## 2012-01-07 DIAGNOSIS — J309 Allergic rhinitis, unspecified: Secondary | ICD-10-CM

## 2012-01-07 DIAGNOSIS — Z8639 Personal history of other endocrine, nutritional and metabolic disease: Secondary | ICD-10-CM

## 2012-01-07 DIAGNOSIS — K859 Acute pancreatitis without necrosis or infection, unspecified: Secondary | ICD-10-CM | POA: Insufficient documentation

## 2012-01-07 DIAGNOSIS — Q441 Other congenital malformations of gallbladder: Secondary | ICD-10-CM | POA: Insufficient documentation

## 2012-01-07 DIAGNOSIS — E669 Obesity, unspecified: Secondary | ICD-10-CM

## 2012-01-07 DIAGNOSIS — R1013 Epigastric pain: Secondary | ICD-10-CM

## 2012-01-07 DIAGNOSIS — B373 Candidiasis of vulva and vagina: Secondary | ICD-10-CM

## 2012-01-07 DIAGNOSIS — E785 Hyperlipidemia, unspecified: Secondary | ICD-10-CM

## 2012-01-07 DIAGNOSIS — R109 Unspecified abdominal pain: Secondary | ICD-10-CM

## 2012-01-07 DIAGNOSIS — R030 Elevated blood-pressure reading, without diagnosis of hypertension: Secondary | ICD-10-CM | POA: Insufficient documentation

## 2012-01-07 DIAGNOSIS — Z862 Personal history of diseases of the blood and blood-forming organs and certain disorders involving the immune mechanism: Secondary | ICD-10-CM

## 2012-01-07 DIAGNOSIS — Q4479 Other congenital malformations of liver: Secondary | ICD-10-CM | POA: Insufficient documentation

## 2012-01-07 DIAGNOSIS — K219 Gastro-esophageal reflux disease without esophagitis: Secondary | ICD-10-CM

## 2012-01-07 HISTORY — DX: Unspecified asthma, uncomplicated: J45.909

## 2012-01-07 MED ORDER — TRAMADOL HCL 50 MG PO TABS
50.0000 mg | ORAL_TABLET | Freq: Four times a day (QID) | ORAL | Status: DC | PRN
  Administered 2012-01-07: 50 mg via ORAL
  Filled 2012-01-07: qty 1

## 2012-01-07 MED ORDER — ALBUTEROL SULFATE HFA 108 (90 BASE) MCG/ACT IN AERS
2.0000 | INHALATION_SPRAY | Freq: Four times a day (QID) | RESPIRATORY_TRACT | Status: DC | PRN
  Filled 2012-01-07: qty 6.7

## 2012-01-07 MED ORDER — INFLUENZA VIRUS VACC SPLIT PF IM SUSP
0.5000 mL | INTRAMUSCULAR | Status: AC
  Administered 2012-01-08: 0.5 mL via INTRAMUSCULAR
  Filled 2012-01-07: qty 0.5

## 2012-01-07 MED ORDER — PANTOPRAZOLE SODIUM 40 MG PO TBEC
40.0000 mg | DELAYED_RELEASE_TABLET | Freq: Every day | ORAL | Status: DC
  Administered 2012-01-08 – 2012-01-11 (×4): 40 mg via ORAL
  Filled 2012-01-07 (×4): qty 1

## 2012-01-07 MED ORDER — FLUTICASONE-SALMETEROL 250-50 MCG/DOSE IN AEPB
2.0000 | INHALATION_SPRAY | Freq: Two times a day (BID) | RESPIRATORY_TRACT | Status: DC
  Administered 2012-01-08 – 2012-01-11 (×7): 2 via RESPIRATORY_TRACT
  Filled 2012-01-07 (×2): qty 14

## 2012-01-07 MED ORDER — LEVOCETIRIZINE DIHYDROCHLORIDE 5 MG PO TABS: 5.0000 mg | ORAL_TABLET | Freq: Every evening | ORAL | Status: DC

## 2012-01-07 MED ORDER — LORATADINE 10 MG PO TABS
10.0000 mg | ORAL_TABLET | Freq: Every day | ORAL | Status: DC
  Administered 2012-01-08 – 2012-01-11 (×4): 10 mg via ORAL
  Filled 2012-01-07 (×4): qty 1

## 2012-01-07 MED ORDER — MAGNESIUM GLUCONATE 500 MG PO TABS
500.0000 mg | ORAL_TABLET | Freq: Every day | ORAL | Status: DC
  Administered 2012-01-08: 500 mg via ORAL
  Filled 2012-01-07 (×3): qty 1

## 2012-01-07 MED ORDER — TRAMADOL HCL 50 MG PO TABS
50.0000 mg | ORAL_TABLET | Freq: Four times a day (QID) | ORAL | Status: DC | PRN
  Administered 2012-01-08 (×2): 50 mg via ORAL
  Filled 2012-01-07 (×2): qty 1

## 2012-01-07 NOTE — H&P (Signed)
Michaeline Judie Petit Bielak is an 41 y.o. female.   Chief Complaint: abdominal pain HPI:  41 yo female who presents with epigastric abdominal pain. She was recently admitted to the hospital (01/02/12) with similar abdominal pain. Was found to have elevated LFT's which trended down rapidly. Patient is s/p cholecystectomy and it was thought that she had passed a common bile stone. On discharge on 01/04/12, she was having some minimal abdominal pain which became increasingly worst today. She made an urgent appointment to see Dr. Loreta Ave with gastroenterology and was directly admitted for pain control and further evaluation. Patient reports pain as sharp, 10/10, can last for hours, is intermittent, located in left upper quadrant and epigastric region. Has been eating and drinking normally. No nausea, no vomiting. No diarrhea, no vomiting. Reports more burping and heart burn. Reports chills, no fevers. Tried taking ibuprofen for the pain which did not help.   Past Medical History  Diagnosis Date  . Endometriosis   . Hyperlipidemia   . No pertinent past medical history   . Kidney stones    Urgent Care: [facility] Past Surgical History  Procedure Date  . Cholecystectomy   . Abdominal hysterectomy   . Oophrectomy 2011    secondary to cysts  . Carpel tunnel   . Arthroscopic knee surgery    . Hip surgery     Family History  Problem Relation Age of Onset  . Hypertension Other    Social History:  reports that she has never smoked. She has never used smokeless tobacco. She reports that she does not drink alcohol or use illicit drugs.  Allergies: No Known Allergies  Medications Prior to Admission  Medication Sig Dispense Refill  . albuterol (PROVENTIL HFA;VENTOLIN HFA) 108 (90 BASE) MCG/ACT inhaler Inhale 2 puffs into the lungs every 6 (six) hours as needed. For shortness of breath or coughing      . diclofenac (VOLTAREN) 75 MG EC tablet Take 75 mg by mouth at bedtime.      . Fluticasone-Salmeterol (ADVAIR)  250-50 MCG/DOSE AEPB Inhale 2 puffs into the lungs every 12 (twelve) hours.      Marland Kitchen levocetirizine (XYZAL) 5 MG tablet Take 5 mg by mouth every evening.      . magnesium gluconate (MAGONATE) 500 MG tablet Take 500 mg by mouth at bedtime.      . pantoprazole (PROTONIX) 40 MG tablet Take 1 tablet (40 mg total) by mouth daily at 12 noon.  30 tablet  0  . PRESCRIPTION MEDICATION once a week. Allergy shots at doctor's office once weekly        No results found for this or any previous visit (from the past 48 hour(s)). No results found.  Review of Systems  All other systems reviewed and are negative.    Blood pressure 114/66, pulse 93, temperature 98.3 F (36.8 C), temperature source Oral, resp. rate 18, height 5' 4.5" (1.638 m), weight 184 lb (83.462 kg), last menstrual period 06/06/1996, SpO2 98.00%. Physical Exam  Constitutional: She is oriented to person, place, and time. She appears well-developed. No distress.  HENT:  Head: Normocephalic.  Mouth/Throat: Oropharynx is clear and moist.  Eyes: EOM are normal. Pupils are equal, round, and reactive to light.  Neck: Normal range of motion. Neck supple.  Cardiovascular: Normal rate, regular rhythm and normal heart sounds.   Respiratory: Effort normal and breath sounds normal.  GI: Soft. Bowel sounds are normal. She exhibits no distension. There is tenderness. There is no rebound and no guarding.  Epigastric tenderness  Musculoskeletal: She exhibits no edema.  Neurological: She is alert and oriented to person, place, and time. No cranial nerve deficit.  Skin: Skin is warm and dry.  Psychiatric: She has a normal mood and affect. Her behavior is normal.     Assessment/Plan 41 yo female who presents with abdominal pain, similar in nature to the pain from her recent hospitalization. 1. Epigastric pain: given location and description of reflux, this could be secondary to severe GERD or gastritis. Will continue protonix 40mg  daily and stop  NSAIDs. This could also be due to stones in the common bile duct. - follow up GI recommendations tomorrow morning for possible ERCP - repeat CMP, CBC and lipase today - pain control with tramadol since patient reports not liking how morphine makes her feel.  2. Fen/Gi:  - NPO after midnight in case of procedure. NS at 100cc/hr 3. PPx: heparin 5000u tid 4. Dispo: pending further workup  Nyshawn Gowdy 01/07/2012, 7:30 PM

## 2012-01-08 ENCOUNTER — Inpatient Hospital Stay (HOSPITAL_COMMUNITY): Payer: 59

## 2012-01-08 ENCOUNTER — Encounter (HOSPITAL_COMMUNITY): Payer: Self-pay | Admitting: *Deleted

## 2012-01-08 ENCOUNTER — Encounter (HOSPITAL_COMMUNITY)
Admission: AD | Disposition: A | Discharge status: Home / Self Care | Payer: Self-pay | Source: Ambulatory Visit | Attending: Family Medicine

## 2012-01-08 HISTORY — PX: ERCP: SHX5425

## 2012-01-08 LAB — CBC
HCT: 37.7 % (ref 36.0–46.0)
MCH: 30.2 pg (ref 26.0–34.0)
MCHC: 35 g/dL (ref 30.0–36.0)
MCHC: 35.3 g/dL (ref 30.0–36.0)
Platelets: 282 10*3/uL (ref 150–400)
Platelets: 287 10*3/uL (ref 150–400)
RDW: 12.7 % (ref 11.5–15.5)
WBC: 9.1 10*3/uL (ref 4.0–10.5)

## 2012-01-08 LAB — COMPREHENSIVE METABOLIC PANEL
ALT: 81 U/L — ABNORMAL HIGH (ref 0–35)
ALT: 619 U/L — ABNORMAL HIGH (ref 0–35)
AST: 1199 U/L — ABNORMAL HIGH (ref 0–37)
Albumin: 3.7 g/dL (ref 3.5–5.2)
Alkaline Phosphatase: 88 U/L (ref 39–117)
Alkaline Phosphatase: 156 U/L — ABNORMAL HIGH (ref 39–117)
BUN: 7 mg/dL (ref 6–23)
CO2: 24 mEq/L (ref 19–32)
Chloride: 103 mEq/L (ref 96–112)
Chloride: 102 mEq/L (ref 96–112)
GFR calc Af Amer: >90 mL/min (ref >90)
GFR calc non Af Amer: 82 mL/min — ABNORMAL LOW (ref >90)
Glucose, Bld: 109 mg/dL — ABNORMAL HIGH (ref 70–99)
Potassium: 4 mEq/L (ref 3.5–5.1)
Potassium: 4.4 mEq/L (ref 3.5–5.1)
Sodium: 135 mEq/L (ref 135–145)
Sodium: 135 mEq/L (ref 135–145)
Total Bilirubin: 1.5 mg/dL — ABNORMAL HIGH (ref 0.3–1.2)
Total Protein: 7.6 g/dL (ref 6.0–8.3)

## 2012-01-08 LAB — LIPASE, BLOOD: Lipase: 1363 U/L — ABNORMAL HIGH (ref 11–59)

## 2012-01-08 LAB — MAGNESIUM: Magnesium: 2.1 mg/dL (ref 1.5–2.5)

## 2012-01-08 SURGERY — ERCP, WITH INTERVENTION IF INDICATED
Anesthesia: Moderate Sedation

## 2012-01-08 MED ORDER — ONDANSETRON HCL 4 MG/2ML IJ SOLN
INTRAMUSCULAR | Status: AC
  Filled 2012-01-08: qty 2

## 2012-01-08 MED ORDER — MAGNESIUM HYDROXIDE 400 MG/5ML PO SUSP: 5.0000 mL | Freq: Every day | ORAL | Status: DC

## 2012-01-08 MED ORDER — SENNOSIDES-DOCUSATE SODIUM 8.6-50 MG PO TABS
1.0000 | ORAL_TABLET | Freq: Every day | ORAL | Status: DC
Start: 2012-01-08 — End: 2012-01-11
  Administered 2012-01-09 – 2012-01-10 (×2): 1 via ORAL
  Filled 2012-01-08 (×2): qty 1

## 2012-01-08 MED ORDER — DIPHENHYDRAMINE HCL 50 MG/ML IJ SOLN
12.5000 mg | Freq: Three times a day (TID) | INTRAMUSCULAR | Status: DC | PRN
  Administered 2012-01-08 – 2012-01-10 (×5): 12.5 mg via INTRAVENOUS
  Filled 2012-01-08 (×5): qty 1

## 2012-01-08 MED ORDER — FENTANYL CITRATE 0.05 MG/ML IJ SOLN
INTRAMUSCULAR | Status: DC | PRN
  Administered 2012-01-08 (×4): 25 ug via INTRAVENOUS

## 2012-01-08 MED ORDER — HYDROMORPHONE HCL PF 1 MG/ML IJ SOLN
1.0000 mg | INTRAMUSCULAR | Status: DC | PRN
  Administered 2012-01-08 – 2012-01-09 (×3): 1 mg via INTRAVENOUS
  Administered 2012-01-09: 0.5 mg via INTRAVENOUS
  Administered 2012-01-09 – 2012-01-10 (×2): 1 mg via INTRAVENOUS
  Filled 2012-01-08 (×6): qty 1

## 2012-01-08 MED ORDER — HYDROMORPHONE HCL PF 1 MG/ML IJ SOLN: 0.5000 mg | INTRAMUSCULAR | Status: DC | PRN

## 2012-01-08 MED ORDER — SODIUM CHLORIDE 0.9 % IV BOLUS (SEPSIS)
1000.0000 mL | Freq: Once | INTRAVENOUS | Status: AC
  Administered 2012-01-08: 1000 mL via INTRAVENOUS

## 2012-01-08 MED ORDER — BUTAMBEN-TETRACAINE-BENZOCAINE 2-2-14 % EX AERO
INHALATION_SPRAY | CUTANEOUS | Status: DC | PRN
  Administered 2012-01-08: 2 via TOPICAL

## 2012-01-08 MED ORDER — ONDANSETRON 8 MG PO TBDP
8.0000 mg | ORAL_TABLET | Freq: Three times a day (TID) | ORAL | Status: DC | PRN
  Filled 2012-01-08: qty 1

## 2012-01-08 MED ORDER — GLUCAGON HCL (RDNA) 1 MG IJ SOLR
INTRAMUSCULAR | Status: AC
  Filled 2012-01-08: qty 2

## 2012-01-08 MED ORDER — HEPARIN SODIUM (PORCINE) 5000 UNIT/ML IJ SOLN
5000.0000 [IU] | Freq: Three times a day (TID) | INTRAMUSCULAR | Status: DC
  Administered 2012-01-08 – 2012-01-11 (×11): 5000 [IU] via SUBCUTANEOUS
  Filled 2012-01-08 (×14): qty 1

## 2012-01-08 MED ORDER — SODIUM CHLORIDE 0.9 % IV SOLN
1.5000 g | INTRAVENOUS | Status: DC
  Administered 2012-01-08: 1.5 g via INTRAVENOUS
  Filled 2012-01-08: qty 1.5

## 2012-01-08 MED ORDER — GI COCKTAIL ~~LOC~~
30.0000 mL | Freq: Once | ORAL | Status: AC
  Administered 2012-01-08: 30 mL via ORAL
  Filled 2012-01-08: qty 30

## 2012-01-08 MED ORDER — MIDAZOLAM HCL 10 MG/2ML IJ SOLN
INTRAMUSCULAR | Status: DC | PRN
  Administered 2012-01-08: 2 mg via INTRAVENOUS
  Administered 2012-01-08: 1 mg via INTRAVENOUS
  Administered 2012-01-08 (×3): 2 mg via INTRAVENOUS

## 2012-01-08 MED ORDER — DIPHENHYDRAMINE HCL 50 MG/ML IJ SOLN
INTRAMUSCULAR | Status: DC | PRN
  Administered 2012-01-08 (×2): 25 mg via INTRAVENOUS

## 2012-01-08 MED ORDER — ONDANSETRON HCL 4 MG/2ML IJ SOLN
4.0000 mg | Freq: Once | INTRAMUSCULAR | Status: AC
  Administered 2012-01-08: 4 mg via INTRAVENOUS

## 2012-01-08 MED ORDER — GLUCAGON HCL (RDNA) 1 MG IJ SOLR
INTRAMUSCULAR | Status: DC | PRN
  Administered 2012-01-08 (×2): .5 mg via INTRAVENOUS

## 2012-01-08 MED ORDER — FENTANYL CITRATE 0.05 MG/ML IJ SOLN
INTRAMUSCULAR | Status: AC
  Filled 2012-01-08: qty 4

## 2012-01-08 MED ORDER — SODIUM CHLORIDE 0.9 % IV SOLN: INTRAVENOUS | Status: DC

## 2012-01-08 MED ORDER — SODIUM CHLORIDE 0.9 % IV SOLN
INTRAVENOUS | Status: DC
  Administered 2012-01-08 (×2): via INTRAVENOUS

## 2012-01-08 MED ORDER — DIPHENHYDRAMINE HCL 50 MG/ML IJ SOLN
INTRAMUSCULAR | Status: AC
  Filled 2012-01-08: qty 1

## 2012-01-08 MED ORDER — SODIUM CHLORIDE 0.9 % IV SOLN
INTRAVENOUS | Status: DC | PRN
  Administered 2012-01-08: 12:00:00

## 2012-01-08 MED ORDER — SODIUM CHLORIDE 0.9 % IV SOLN
INTRAVENOUS | Status: DC
  Administered 2012-01-09: 02:00:00 via INTRAVENOUS

## 2012-01-08 MED ORDER — MORPHINE SULFATE 2 MG/ML IJ SOLN
1.0000 mg | INTRAMUSCULAR | Status: DC | PRN
  Administered 2012-01-08: 1 mg via INTRAVENOUS
  Filled 2012-01-08: qty 1

## 2012-01-08 MED ORDER — MAGNESIUM HYDROXIDE 400 MG/5ML PO SUSP
15.0000 mL | Freq: Every day | ORAL | Status: DC
  Administered 2012-01-09: 30 mL via ORAL
  Administered 2012-01-10 – 2012-01-11 (×2): 15 mL via ORAL
  Filled 2012-01-08 (×5): qty 30

## 2012-01-08 MED ORDER — MIDAZOLAM HCL 5 MG/ML IJ SOLN
INTRAMUSCULAR | Status: AC
  Filled 2012-01-08: qty 4

## 2012-01-08 NOTE — Progress Notes (Addendum)
Family Medicine Teaching Service Interim Progress Note [phone]  S: Patient complaining of moderate to severe epigastric pain. The pain is a deep ache with intermittent sharp pains. The pain has not improved with both tramadol and morphine. She moved her bowels normally this AM. She has a ERCP done this AM that was significant for mildly dilated CBD and intrahepatic ducts without evidence of retained stones.   Patient takes Voltaren for back pain but does so sparingly, not daily. She denies taking ASA or any other NSAIDs.     O:  BP 152/82  Pulse 99  Temp 98.1 F (36.7 C) (Oral)  Resp 24  Ht 5' 4.5" (1.638 m)  Wt 184 lb (83.462 kg)  BMI 31.10 kg/m2  SpO2 100%  LMP 06/06/1996 General appearance: alert and moderate distress, rocking in bed. When I returned shortly to ask additional questions she appears more comfortable texting on the phone.  Abdomen: NABs, soft, mild TTP epigastric. No rebound or guarding.   A/P:   [name] with persistent epigastric pain despite PPI, tramadol and morphine. Also with a relatively normal ERCP.   1. Abdominal pain: A: afebrile. No evidence of acute abdomen on exam. Vitals signs just obtained wnl.  Unclear etiology of pain, ? Gastritis vs gastric ulcer.  P: -stat CBC and CMP (liver enzymes have been trending down).  -ordered GI cocktail, if no improvement dilaudid.  -paged GI for a recommendation regarding a diagnostic EGD.  2. Elevated BP A: I am attributing this to pain as patient it normotensive at baseline (reviewed PCP clinic notes)  P: pain control per above.   Mikle Sternberg 5:55 PM, 01/08/12    I spoke to Dr. Loreta Ave with GI (covering for Dr. Elnoria Howard) and explained the patient's condition. She feels that she might be experiencing post ERCP pancreatitis from her sphincterotomy. The plan is to make her NPO except for medications, add lipase to labs, continue current pain control and provide fluid boluses up to 4 L. Dr. Loreta Ave will see the patient  in the AM. I appreciate GI's input.   Caspar Favila 7:16 PM 01/08/12

## 2012-01-08 NOTE — Progress Notes (Signed)
Seen and examined.  My note of today is in the co sign of the H&PE.  I should note that she is getting an ERCP, not an MRCP.

## 2012-01-08 NOTE — H&P (Signed)
Seen and examined.  Reviewed and discussed with Dr. Gwenlyn Saran.  Agree with her management and documentation.  Briefly, I know Kendra Gonzalez from previous admit.  As documented by Dr. Gwenlyn Saran, she was improving and we had hoped that she had passed a common duct stone.  Unfortunately, her pain has recurred causing readmit.  This morning, she feels better, but not eating (NPO for MRCP).  We will involve Dr. Loreta Ave in care and await test results.

## 2012-01-08 NOTE — Interval H&P Note (Signed)
History and Physical Interval Note:  01/08/2012 10:56 AM  Kendra Gonzalez  has presented today for surgery, with the diagnosis of Abnormal liver enzymes and epigastric pain  The various methods of treatment have been discussed with the patient and family. After consideration of risks, benefits and other options for treatment, the patient has consented to  Procedure(s) (LRB) with comments: ENDOSCOPIC RETROGRADE CHOLANGIOPANCREATOGRAPHY (ERCP) (N/A) as a surgical intervention .  The patient's history has been reviewed, patient examined, no change in status, stable for surgery.  I have reviewed the patient's chart and labs.  Questions were answered to the patient's satisfaction.     Dragon Thrush D

## 2012-01-08 NOTE — Discharge Summary (Signed)
Reviewed and agree with Dr. Armen Pickup' discharge, management and documentation.

## 2012-01-08 NOTE — Consult Note (Signed)
Reason for Consult: Epigastric pain and recent history of abnormal liver enzymes Referring Physician: Family practice  Kendra Gonzalez HPI: This is a 41 year old female with a PMH of cholelithiasis s/p cholecystectomy in 1992 who is admitted for severe epigastric pain.  The patient reports the onset of her pain almost one week ago.  It was severe to the point that she presented to the ER and she was admitted.  The blood work at that time revealed a rapid rise and drop in her liver enzymes over the course of a couple of days.  It was felt that she passed a retained stone, but when she was discharged she did not have complete resolution of her pain.  The imaging with a RUQ U/S and CT scan was negative for any overt retained stones.  Her CBD measured 4 mm, but the CT scan remarked that there was mild intra and extrahepatic biliary ductal dilation.  No elevation in her AP.  She cannot correlate her pain with PO intake and currently her liver profile has normalized despite the persistent pain.  This pain is reminiscent of the prior gallstone pain in 1992.  Past Medical History  Diagnosis Date  . Endometriosis   . Hyperlipidemia   . No pertinent past medical history   . Kidney stones     Past Surgical History  Procedure Date  . Cholecystectomy   . Abdominal hysterectomy   . Oophrectomy 2011    secondary to cysts  . Carpel tunnel   . Arthroscopic knee surgery    . Hip surgery     Family History  Problem Relation Age of Onset  . Hypertension Other     Social History:  reports that she has never smoked. She has never used smokeless tobacco. She reports that she does not drink alcohol or use illicit drugs.  Allergies: No Known Allergies  Medications:  Scheduled:   . Fluticasone-Salmeterol  2 puff Inhalation Q12H  . heparin subcutaneous  5,000 Units Subcutaneous Q8H  . influenza  inactive virus vaccine  0.5 mL Intramuscular Tomorrow-1000  . loratadine  10 mg Oral Daily  . magnesium  gluconate  500 mg Oral QHS  . pantoprazole  40 mg Oral Q1200  . DISCONTD: levocetirizine  5 mg Oral QPM   Continuous:   . sodium chloride 100 mL/hr at 01/08/12 0421    Results for orders placed during the hospital encounter of 01/07/12 (from the past 24 hour(s))  COMPREHENSIVE METABOLIC PANEL     Status: Abnormal   Collection Time   01/07/12 11:57 PM      Component Value Range   Sodium 135  135 - 145 mEq/L   Potassium 4.0  3.5 - 5.1 mEq/L   Chloride 103  96 - 112 mEq/L   CO2 24  19 - 32 mEq/L   Glucose, Bld 109 (*) 70 - 99 mg/dL   BUN 7  6 - 23 mg/dL   Creatinine, Ser 2.95  0.50 - 1.10 mg/dL   Calcium 9.7  8.4 - 62.1 mg/dL   Total Protein 7.2  6.0 - 8.3 g/dL   Albumin 3.5  3.5 - 5.2 g/dL   AST 22  0 - 37 U/L   ALT 81 (*) 0 - 35 U/L   Alkaline Phosphatase 88  39 - 117 U/L   Total Bilirubin 0.4  0.3 - 1.2 mg/dL   GFR calc non Af Amer 82 (*) >90 mL/min   GFR calc Af Amer >90  >  90 mL/min  LIPASE, BLOOD     Status: Normal   Collection Time   01/07/12 11:57 PM      Component Value Range   Lipase 23  11 - 59 U/L  CBC     Status: Normal   Collection Time   01/07/12 11:57 PM      Component Value Range   WBC 9.3  4.0 - 10.5 K/uL   RBC 4.31  3.87 - 5.11 MIL/uL   Hemoglobin 13.0  12.0 - 15.0 g/dL   HCT 24.4  01.0 - 27.2 %   MCV 86.1  78.0 - 100.0 fL   MCH 30.2  26.0 - 34.0 pg   MCHC 35.0  30.0 - 36.0 g/dL   RDW 53.6  64.4 - 03.4 %   Platelets 287  150 - 400 K/uL     No results found.  ROS:  As stated above in the HPI otherwise negative.  Blood pressure 113/65, pulse 76, temperature 98.4 F (36.9 C), temperature source Oral, resp. rate 20, height 5' 4.5" (1.638 m), weight 83.462 kg (184 lb), last menstrual period 06/06/1996, SpO2 100.00%.    PE: Gen: NAD, Alert and Oriented HEENT:  [place]/AT, EOMI Neck: Supple, no LAD Lungs: CTA Bilaterally CV: RRR without M/G/R ABM: Soft, epigastric tenderness, +BS Ext: No C/C/E  Assessment/Plan: 1) Epigastric pain. 2) Recent history  of abnormal liver enzymes.   I cannot be certain, but she may have a retained stone(s).  Her current liver panel is normal and even when her liver enzymes were increasing the CBD was measured at 4 mm.  She could have microlithiasis.  I think with her clinical presentation and the blood work an ERCP is in order.  I discussed the risks of bleeding, perforation, and pancreatitis with the patient and she consents to the procedure.  Plan: 1) ERCP now.  Further recommendations pending the findings.  Rebeca Valdivia D 01/08/2012, 10:12 AM

## 2012-01-08 NOTE — Op Note (Signed)
[facility] [address] [place], [address]   ERCP PROCEDURE REPORT  PATIENT: Kendra Gonzalez, Kendra Gonzalez.  MR# :[id number] BIRTHDATE: [birth year]  GENDER: Female ENDOSCOPIST: Jeani Hawking, MD REFERRED BY: PROCEDURE DATE:  01/08/2012 PROCEDURE:   ERCP with removal of calculus/calculi ASA CLASS:   Class II INDICATIONS:abdominal pain of suspected biliary origin. MEDICATIONS: Propofol (Diprivan), Fentanyl 100 mcg IV, Versed 9 mg IV, Glucagon 1 mg IV, and Benadryl 50 mg IV TOPICAL ANESTHETIC: Cetacaine Spray  DESCRIPTION OF PROCEDURE:   After the risks benefits and alternatives of the procedure were thoroughly explained, informed consent was obtained.  The Pentax [id number]  endoscope was introduced through the mouth  and advanced to the second portion of the duodenum .  The ampulla was identified and there was spontaneous drainage of bile.  Cannulation of the CBD was moderately difficult.  The PD was cannulated and the guidewire was left in place.  A secondary guidewire was employed and after several attempts the CBD was cannulated.  The pancreatic guidewire was promptly removed. Contrast injection of the CBD revealed a mildly dilated CBD ariound 8 mm and some dilated intrahepatic ducts.  No clear filling defects or sludge.  Because of the intermittent elevations of her liver enzymes an 8 mm spincterotomy was created.  The CBD was swepted three times and there was no evidence of any stones or sludge.  A final occlusion cholangiogram was performed and it was negative for any retained stones.  At this point the procedure was terminated. The scope was then completely withdrawn from the patient and the procedure terminated.     COMPLICATIONS: .  There were no complications.  ENDOSCOPIC IMPRESSION: 1) No evidence of any retained stones. 2) Mildly dilated CBD and intrahepatic ducts. 3) S/p cholecystectomy.  RECOMMENDATIONS: 1.  1) Follow liver  enzymes. 2) Pain control. 2.  1) Follow liver enzymes. 2) Pain control.     _______________________________ eSignedJeani Hawking, MD 01/08/2012 11:54 AM   CC:

## 2012-01-08 NOTE — Progress Notes (Signed)
Patient ID: MIGUEL MEDAL, female   DOB: 07-Sep-1970, 41 y.o.   MRN: [id number] Family Medicine Teaching Service Daily Progress Note Service Page: [phone]  Patient Assessment: 41 yo female recently discharged from the [facility], here with similar complaints of abdominal epigastric pain  Subjective: patient states doing well this morning.  Complaint of small amount of epigastric pain. Denies nausea, vomiting, chills.  States tramadol controlling pain.  Objective: Temp:  [98.1 F (36.7 C)-98.4 F (36.9 C)] 98.4 F (36.9 C) (09/13 0646) Pulse Rate:  [76-100] 76  (09/13 0646) Resp:  [18-20] 20  (09/13 0646) BP: (111-120)/(63-74) 113/65 mmHg (09/13 0646) SpO2:  [98 %-100 %] 100 % (09/13 0646) Weight:  [184 lb (83.462 kg)] 184 lb (83.462 kg) (09/12 1815) Exam: General: no acute distress, resting comfortably in bed Cardiovascular: rrr, no murmurs, rubs, or gallops Respiratory: CTAB anteriorly, no wheezes Abdomen: soft, epigastric tenderness, ND, positive BS Extremities: no edema  I have reviewed the patient's medications, labs, imaging, and diagnostic testing.  Notable results are summarized below.  CBC BMET   Lab 01/07/12 2357 01/04/12 0600 01/03/12 0630  WBC 9.3 5.1 6.7  HGB 13.0 12.4 12.7  HCT 37.1 36.2 36.7  PLT 287 244 250    Lab 01/07/12 2357 01/04/12 0600 01/03/12 1529  NA 135 136 135  K 4.0 4.2 4.3  CL 103 102 102  CO2 24 26 27   BUN 7 6 5*  CREATININE 0.86 0.76 0.77  GLUCOSE 109* 98 100*  CALCIUM 9.7 9.6 9.7     Results for orders placed during the hospital encounter of 01/07/12 (from the past 24 hour(s))  COMPREHENSIVE METABOLIC PANEL     Status: Abnormal   Collection Time   01/07/12 11:57 PM      Component Value Range   Sodium 135  135 - 145 mEq/L   Potassium 4.0  3.5 - 5.1 mEq/L   Chloride 103  96 - 112 mEq/L   CO2 24  19 - 32 mEq/L   Glucose, Bld 109 (*) 70 - 99 mg/dL   BUN 7  6 - 23 mg/dL   Creatinine, Ser 1.47  0.50 - 1.10 mg/dL   Calcium 9.7  8.4 - 82.9  mg/dL   Total Protein 7.2  6.0 - 8.3 g/dL   Albumin 3.5  3.5 - 5.2 g/dL   AST 22  0 - 37 U/L   ALT 81 (*) 0 - 35 U/L   Alkaline Phosphatase 88  39 - 117 U/L   Total Bilirubin 0.4  0.3 - 1.2 mg/dL   GFR calc non Af Amer 82 (*) >90 mL/min   GFR calc Af Amer >90  >90 mL/min  LIPASE, BLOOD     Status: Normal   Collection Time   01/07/12 11:57 PM      Component Value Range   Lipase 23  11 - 59 U/L  CBC     Status: Normal   Collection Time   01/07/12 11:57 PM      Component Value Range   WBC 9.3  4.0 - 10.5 K/uL   RBC 4.31  3.87 - 5.11 MIL/uL   Hemoglobin 13.0  12.0 - 15.0 g/dL   HCT 56.2  13.0 - 86.5 %   MCV 86.1  78.0 - 100.0 fL   MCH 30.2  26.0 - 34.0 pg   MCHC 35.0  30.0 - 36.0 g/dL   RDW 78.4  69.6 - 29.5 %   Platelets 287  150 - 400 K/uL  Imaging/Diagnostic Tests: none  Plan: 41 yo female who presents with abdominal pain, similar in nature to the pain from her recent hospitalization.   1. Epigastric pain: given location and description of reflux, this could be secondary to severe GERD or gastritis. This could also be due to stones in the common bile duct.  - GI recommendations this morning for ERCP  - CBC, lipase, and CMET wnl, with exception of ALT of 81 - pain control with tramadol since patient reports not liking how morphine makes her feel.  - protonix 40 mg daily - CTA abdomen-possible vascular cause for pain  2. Fen/Gi:  - NPO after midnight in case of procedure. NS at 100cc/hr   3. PPx: heparin 5000u tid   4. Dispo: pending further workup and GI recs   Marikay Alar, MD 01/08/2012, 8:48 AM

## 2012-01-09 ENCOUNTER — Encounter (HOSPITAL_COMMUNITY): Payer: Self-pay | Admitting: *Deleted

## 2012-01-09 LAB — COMPREHENSIVE METABOLIC PANEL
ALT: 872 U/L — ABNORMAL HIGH (ref 0–35)
Alkaline Phosphatase: 184 U/L — ABNORMAL HIGH (ref 39–117)
BUN: 6 mg/dL (ref 6–23)
CO2: 22 mEq/L (ref 19–32)
GFR calc Af Amer: >90 mL/min (ref >90)
GFR calc non Af Amer: >90 mL/min (ref >90)
Glucose, Bld: 115 mg/dL — ABNORMAL HIGH (ref 70–99)
Potassium: 4.5 mEq/L (ref 3.5–5.1)
Total Protein: 7.4 g/dL (ref 6.0–8.3)

## 2012-01-09 LAB — CBC
HCT: 37.3 % (ref 36.0–46.0)
Hemoglobin: 12.6 g/dL (ref 12.0–15.0)
MCH: 29.4 pg (ref 26.0–34.0)
MCHC: 33.8 g/dL (ref 30.0–36.0)
RBC: 4.28 MIL/uL (ref 3.87–5.11)

## 2012-01-09 LAB — LIPASE, BLOOD: Lipase: 290 U/L — ABNORMAL HIGH (ref 11–59)

## 2012-01-09 MED ORDER — SODIUM CHLORIDE 0.9 % IV BOLUS (SEPSIS)
1000.0000 mL | Freq: Once | INTRAVENOUS | Status: AC
  Administered 2012-01-09: 1000 mL via INTRAVENOUS

## 2012-01-09 MED ORDER — ONDANSETRON 8 MG/NS 50 ML IVPB
8.0000 mg | Freq: Three times a day (TID) | INTRAVENOUS | Status: DC | PRN
  Administered 2012-01-09 – 2012-01-10 (×3): 8 mg via INTRAVENOUS
  Filled 2012-01-09 (×3): qty 8

## 2012-01-09 MED ORDER — SODIUM CHLORIDE 0.9 % IV SOLN
INTRAVENOUS | Status: DC
  Administered 2012-01-09 – 2012-01-11 (×6): via INTRAVENOUS
  Administered 2012-01-11: 150 mL/h via INTRAVENOUS

## 2012-01-09 NOTE — Progress Notes (Signed)
Subjective:  Since I last evaluated the patient, she seems to be doing fairly well. Lipase 290 from 1363 yesterday. Has ERCP induced pancreatitis. Mild nausea without vomiting. No fever, chills or rigors.   Objective: Vital signs in last 24 hours: Temp:  [97.6 F (36.4 C)-98.6 F (37 C)] 98.6 F (37 C) (09/14 1338) Pulse Rate:  [62-100] 83  (09/14 1338) Resp:  [18-24] 18  (09/14 1338) BP: (115-152)/(71-82) 128/74 mmHg (09/14 1338) SpO2:  [96 %-100 %] 100 % (09/14 1338) Last BM Date: 01/06/12  Intake/Output from previous day: 09/13 0701 - 09/14 0700 In: 1253 [P.O.:240; I.V.:1013] Out: -  Intake/Output this shift: Total I/O In: 240 [P.O.:240] Out: -   General appearance: alert, cooperative, appears stated age and mild distress Resp: clear to auscultation bilaterally Cardio: regular rate and rhythm, S1, S2 normal, no murmur, click, rub or gallop GI: soft, mild periumbilical tenderness on palpation with minimal gaurding but without rebound or rigidity; bowel sounds are hypoactive; no masses,  no organomegaly Extremities: extremities normal, atraumatic, no cyanosis or edema  Lab Results:  Basename 01/09/12 0553 01/08/12 1813 01/07/12 2357  WBC 8.3 9.1 9.3  HGB 12.6 13.3 13.0  HCT 37.3 37.7 37.1  PLT 293 282 287   BMET  Basename 01/09/12 0553 01/08/12 1813 01/07/12 2357  NA 136 135 135  K 4.5 4.4 4.0  CL 103 102 103  CO2 22 23 24   GLUCOSE 115* 115* 109*  BUN 6 7 7   CREATININE 0.67 0.76 0.86  CALCIUM 9.5 9.9 9.7   LFT  Basename 01/09/12 0553  PROT 7.4  ALBUMIN 3.6  AST 1100*  ALT 872*  ALKPHOS 184*  BILITOT 1.8*  BILIDIR --  IBILI --   Studies/Results: Dg Ercp With Sphincterotomy  01/08/2012  *RADIOLOGY REPORT*  Clinical Data: Biliary ductal dilatation on prior exam, abdominal pain, elevated liver function tests.  Prior cholecystectomy  ERCP with sphincterotomy  Comparison:  Abdominal ultrasound 01/03/2012, CT 01/02/2012  Technique:  Multiple spot images  obtained with the fluoroscopic device and submitted for interpretation post-procedure.  ERCP was performed by Dr. Elnoria Howard.  Findings: Common duct diameter is borderline dilated.  There is mild central biliary ductal dilatation. Cholecystectomy clips noted.  No filling defect is identified within the visualized bile duct.  Balloon sweep was performed.  Minimal if any contrast is noted passing into the duodenum at the level of the ampulla on the last provided image #4.  IMPRESSION: Borderline common and intrahepatic ductal dilatation.  No focal filling defect identified.  These images were submitted for radiologic interpretation only. Please see the procedural report for the amount of contrast and the fluoroscopy time utilized.   Original Report Authenticated By: Harrel Lemon, M.D.     Medications: I have reviewed the patient's current medications.  Assessment/Plan: Post-ERCP pancreatitis-much improved today. Will monitor LFT's and Lipase closely.  2) Constipation: on Senokot.  3) GERD: On PPI's.  LOS: 2 days   Kendra Gonzalez 01/09/2012, 2:11 PM

## 2012-01-09 NOTE — Progress Notes (Signed)
Subjective: Interval History: patient had worsened epigastric pain throughout the day yesterday concerning for sphincterotomy induced pancreatitis. Rise and LFTs and lipase supported this diagnosis. She was made NPO, liberally hydrated and pain control was advanced. She had an episode of emesis at 0400. Today she reports pain is 0/10 but she has just received her pain medication. She has mild nausea.   Objective: Vital signs in last 24 hours: Temp:  [97.3 F (36.3 C)-98.4 F (36.9 C)] 98.4 F (36.9 C) (09/13 2126) Pulse Rate:  [76-100] 94  (09/13 2126) Resp:  [14-28] 20  (09/13 2126) BP: (113-156)/(63-107) 126/82 mmHg (09/13 2126) SpO2:  [96 %-100 %] 100 % (09/13 2126)  Intake/Output from previous day: 09/13 0701 - 09/14 0700 In: 1253 [P.O.:240; I.V.:1013] Out: -  Intake/Output this shift:   General appearance: alert, cooperative and no distress Throat: lips and oral mucosa dry w/o cracking.  Lungs: clear to auscultation bilaterally Heart: regular rate and rhythm, S1, S2 normal, no murmur, click, rub or gallop Abdomen: soft, non-tender; bowel sounds decreased; no masses,  no organomegaly Pulses: 2+ and symmetric, rate 60 Skin: Skin color, texture, turgor normal. No rashes or lesions Neurologic: Grossly normal Ext: no calf swelling or edema.   Pertinent Labs:  WBC 91.  Hgb 13.3 NA 135 K 4.4 Glucose 115 Ca 9.9 AST 22 >1199 ALT 81>619 Lipase 23>1363 T Bili  0.4>1.5 Alk Phos 88 >156  Studies/Results: Dg Ercp With Sphincterotomy  01/08/2012  *RADIOLOGY REPORT*  Clinical Data: Biliary ductal dilatation on prior exam, abdominal pain, elevated liver function tests.  Prior cholecystectomy  ERCP with sphincterotomy  Comparison:  Abdominal ultrasound 01/03/2012, CT 01/02/2012  Technique:  Multiple spot images obtained with the fluoroscopic device and submitted for interpretation post-procedure.  ERCP was performed by Dr. Elnoria Howard.  Findings: Common duct diameter is borderline dilated.   There is mild central biliary ductal dilatation. Cholecystectomy clips noted.  No filling defect is identified within the visualized bile duct.  Balloon sweep was performed.  Minimal if any contrast is noted passing into the duodenum at the level of the ampulla on the last provided image #4.  IMPRESSION: Borderline common and intrahepatic ductal dilatation.  No focal filling defect identified.  These images were submitted for radiologic interpretation only. Please see the procedural report for the amount of contrast and the fluoroscopy time utilized.   Original Report Authenticated By: Harrel Lemon, M.D.    Dg Abd Acute W/chest  01/02/2012  *RADIOLOGY REPORT*  Clinical Data: Epigastric pain.  ACUTE ABDOMEN SERIES (ABDOMEN 2 VIEW & CHEST 1 VIEW)  Comparison: Acute abdominal series 07/13/2011.  Findings: Lung volumes are normal.  No consolidative airspace disease.  No pleural effusions.  No pneumothorax.  No pulmonary nodule or mass noted.  Pulmonary vasculature and the cardiomediastinal silhouette are within normal limits.  Supine and upright views of the abdomen demonstrates a large loop of gas-filled bowel projecting over the right upper quadrant, favored to represent a portion of the colon.  There is some distal colonic gas, but there are multiple loops of dilated gas filled small bowel measuring up to approximately 3.8 cm in diameter in the left upper quadrant of the abdomen.  No pneumoperitoneum.  Small air fluid levels are noted on the upright projection.  IMPRESSION: 1.  Findings, as above, concerning for early or partial small bowel obstruction. Alternatively, given the focally dilated loop of colon, the findings may simply reflect an ileus, however, the appearance is highly unusual.  For further evaluation  of all of these findings, contrast enhanced CT of the abdomen and pelvis may provide additional diagnostic information. 2.  No radiographic evidence of acute cardiopulmonary disease.  Clinically  significant discrepancy from primary report, if provided: None   Original Report Authenticated By: Florencia Reasons, M.D.    Medications: reviewed.   Assessment/Plan: [name] admitted with worsening epigastric pain subsequently developed mild pancreatitis following ERCP with sphincterotomy.   1. Epigastric pain now secondary to pancreatitis the initial etiology of the pain is still unknown but passed common bile duct stone is most likely.  A: improving on dilaudid, zofran and following IV fluid hydration.  P: -bolus and additional 1 L NS, then take rate back to 150/hr.  -trend LFTs, CBC and lipase. Labs due this AM.  -continue dilaudid prn pain with plan to wean to to PO pain medication as patient tolerates and labs trend towards normal.  -advance diet slowly as tolerated -continue zofran prn nausea.  -continue MOM and senokot-S to maintain normal bowels.  -if patient develops signs of infection (fever, rising WBC) recommend blood culture and initiating antibiotic therapy with Primaxin.   2. Transient HTN: attribute to pain. Now resolved.   3. FEN/GI: NS bolus, then 150/hr electroytes WNL. Recking this AM.  NPO for now. May advance to clears today.   4. DVT PPx: heparin.   5. Code: Full.  6. Dispo: to home pending continued clinical improvement. Patient will need pain adequately controlled on an oral regimen and to be able to tolerate oral nutrition prior to discharge.    LOS: 2 days   Kendra Gonzalez 6:01 AM, 01/09/12 Pager [phone]

## 2012-01-10 LAB — COMPREHENSIVE METABOLIC PANEL
ALT: 496 U/L — ABNORMAL HIGH (ref 0–35)
AST: 214 U/L — ABNORMAL HIGH (ref 0–37)
Albumin: 3.6 g/dL (ref 3.5–5.2)
Alkaline Phosphatase: 163 U/L — ABNORMAL HIGH (ref 39–117)
BUN: 3 mg/dL — ABNORMAL LOW (ref 6–23)
Chloride: 104 mEq/L (ref 96–112)
Potassium: 3.9 mEq/L (ref 3.5–5.1)
Sodium: 137 mEq/L (ref 135–145)
Total Bilirubin: 0.6 mg/dL (ref 0.3–1.2)
Total Protein: 7.2 g/dL (ref 6.0–8.3)

## 2012-01-10 MED ORDER — HYDROMORPHONE HCL PF 1 MG/ML IJ SOLN
INTRAMUSCULAR | Status: AC
  Filled 2012-01-10: qty 1

## 2012-01-10 MED ORDER — HYDROCODONE-ACETAMINOPHEN 5-325 MG PO TABS
1.0000 | ORAL_TABLET | ORAL | Status: DC | PRN
  Administered 2012-01-10 – 2012-01-11 (×6): 1 via ORAL
  Filled 2012-01-10 (×8): qty 1

## 2012-01-10 MED ORDER — HYDROMORPHONE HCL PF 1 MG/ML IJ SOLN
0.5000 mg | Freq: Once | INTRAMUSCULAR | Status: AC
  Administered 2012-01-10: 0.5 mg via INTRAVENOUS

## 2012-01-10 MED ORDER — POLYETHYLENE GLYCOL 3350 17 G PO PACK
17.0000 g | PACK | Freq: Every day | ORAL | Status: DC
  Administered 2012-01-10 – 2012-01-11 (×2): 17 g via ORAL
  Filled 2012-01-10 (×3): qty 1

## 2012-01-10 NOTE — Progress Notes (Signed)
Seen and examined.  Feeling much better.  Advance diet and switch to oral pain meds.  OK to dc when pain controled and tolerating PO

## 2012-01-10 NOTE — Progress Notes (Signed)
Subjective: Since I last evaluated the patient, she claims she is doing better. She developed sever nausea and abdominal pain after she was given a cheese burger and fries for lunch. She has had a bump in her LFT's but these seem to be trending down now.   Objective: Vital signs in last 24 hours: Temp:  [98.1 F (36.7 C)-98.3 F (36.8 C)] 98.1 F (36.7 C) (09/15 0523) Pulse Rate:  [88-92] 88  (09/15 0523) Resp:  [18-19] 18  (09/15 0523) BP: (132-133)/(73-78) 133/78 mmHg (09/15 0523) SpO2:  [97 %-100 %] 100 % (09/15 0523) Last BM Date: 01/06/12  Intake/Output from previous day: 09/14 0701 - 09/15 0700 In: 3844.5 [P.O.:510; I.V.:3334.5] Out: -  Intake/Output this shift: Total I/O In: 1350 [I.V.:1350] Out: -   General appearance: alert, cooperative, appears stated age, no distress and mildly obese Resp: clear to auscultation bilaterally Cardio: regular rate and rhythm, S1, S2 normal, no murmur, click, rub or gallop GI: soft, non-tender; bowel sounds normal; no masses,  no organomegaly Extremities: extremities normal, atraumatic, no cyanosis or edema  Lab Results:  Basename 01/09/12 0553 01/08/12 1813 01/07/12 2357  WBC 8.3 9.1 9.3  HGB 12.6 13.3 13.0  HCT 37.3 37.7 37.1  PLT 293 282 287   BMET  Basename 01/10/12 0932 01/09/12 0553 01/08/12 1813  NA 137 136 135  K 3.9 4.5 4.4  CL 104 103 102  CO2 25 22 23   GLUCOSE 81 115* 115*  BUN 3* 6 7  CREATININE 0.81 0.67 0.76  CALCIUM 9.7 9.5 9.9   LFT  Basename 01/10/12 0932  PROT 7.2  ALBUMIN 3.6  AST 214*  ALT 496*  ALKPHOS 163*  BILITOT 0.6  BILIDIR --  IBILI --   Studies/Results: No results found.  Medications: I have reviewed the patient's current medications.  Assessment/Plan: 1) Abdominal pain of unclear etiology-developed a post-ERCP pancreatitis. Transaminases in the 1100 range yesterday but improving today. Clear liquid diet for now.  2) GERD on PPI's. 3) Constipation on Miralax and Senokot.    LOS:  3 days   Ailanie Ruttan 01/10/2012, 3:16 PM

## 2012-01-10 NOTE — Progress Notes (Signed)
Patient ID: Kendra Gonzalez, female   DOB: [birth year], 41 y.o.   MRN: [id number] Subjective: Patient states doing well this morning, much improved from Friday night when her pain worsened following ERCP. No complaints of pain at this time.  Wants to eat and know when she can go home.  Objective: Vital signs in last 24 hours: Temp:  [98.1 F (36.7 C)-98.6 F (37 C)] 98.1 F (36.7 C) (09/15 0523) Pulse Rate:  [83-92] 88  (09/15 0523) Resp:  [18-19] 18  (09/15 0523) BP: (128-133)/(73-78) 133/78 mmHg (09/15 0523) SpO2:  [97 %-100 %] 100 % (09/15 0523)  Intake/Output from previous day: 09/14 0701 - 09/15 0700 In: 3844.5 [P.O.:510; I.V.:3334.5] Out: -  Intake/Output this shift:   General appearance: alert, cooperative and no distress Lungs: clear to auscultation bilaterally Heart: regular rate and rhythm, S1, S2 normal, no murmur, click, rub or gallop Abdomen: soft, non-tender; bowel sounds decreased; no masses,  no organomegaly Ext: no calf swelling or edema.   Pertinent Labs:  WBC 8.3 AST 22 >1199>1100 ALT 81>619>872 Lipase 23>1363>290 T Bili  0.4>1.5 Alk Phos 88 >156>184  Studies/Results: Dg Ercp With Sphincterotomy  01/08/2012 IMPRESSION: Borderline common and intrahepatic ductal dilatation.  No focal filling defect identified.  These images were submitted for radiologic interpretation only. Please see the procedural report for the amount of contrast and the fluoroscopy time utilized.      Medications: reviewed.   Assessment/Plan: [name] admitted with worsening epigastric pain subsequently developed mild pancreatitis following ERCP with sphincterotomy.   1. Epigastric pain now secondary to pancreatitis the initial etiology of the pain is still unknown but passed common bile duct stone is most likely. Pain improved this morning. Last received dilaudid around 3 am. -IVF 150/hr.  -trend LFTs, CBC and lipase. Labs pending.  -discontinued dilaudid and transitioned to vicodin  PO for pain control.  -advance diet to bland -continue zofran prn nausea.  -continue MOM and senokot-S to maintain normal bowels.   2. Transient HTN: attribute to pain. Now resolved.   3. FEN/GI: NS 150/hr  Bland diet.   4. DVT PPx: heparin.   5. Code: Full.  6. Dispo: Clinically improved.  Likely home later today in pain controlled on PO medications and tolerates diet.   LOS: 3 days   Marikay Alar 9:48 AM, 01/09/12 Pager [phone]

## 2012-01-10 NOTE — Discharge Summary (Signed)
Physician Discharge Summary  Patient ID: Kendra Gonzalez MRN: [id number] DOB: 09/23/1970 Age: 41 y.o.  Admit date: 01/07/2012 Discharge date: 01/11/2012 Admitting Physician: Sanjuana Letters, MD  PCP: Virgilio Belling  Consultants:GI, Dr. Loreta Ave     Discharge Diagnosis: ERCP induced pancreatitis Principal Problem:  *Abdominal pain, acute Active Problems:  GERD    Hospital Course [name] admitted with worsening epigastric pain subsequently developed mild pancreatitis following ERCP with sphincterotomy.   1. Epigastric pain: initially thought to be due to passed common bile duct stone given recent hospitalization with acutely elevated LFTs that trended down quickly.  Patient presented to GI physician for urgent evaluation due to worsening abdominal pain and was admitted for further workup.  Patient underwent ERCP that revealed no stones and had sphincterotomy.  Abdominal pain acutely worsened following ERCP and lipase and LFTs were found to be increased.  Patient now with pain secondary to pancreatitis due to ERCP.  Patient started on pain management with dilaudid and given IV fluid bolus as well as maintenance IV fluids.  At time of discharge lipase, AST, and ALT trending down.  Pain controlled on oral medications at time of discharge. Diet advanced as tolerated. Zofran for nausea and MOM and senokot for bowel movements. On protonix.  2. Transient HTN: 156/107 at highest. Attributed to pain. BP was within normal limits at time of discharge.   Problem List 1. Abdominal pain 2. Pancreatitis secondary to ERCP  3. Transient HTN        Discharge PE   Filed Vitals:   01/10/12 0523  BP: 133/78  Pulse: 88  Temp: 98.1 F (36.7 C)  Resp: 18   General appearance: alert, cooperative and no distress  Lungs: clear to auscultation bilaterally  Heart: regular rate and rhythm, S1, S2 normal, no murmur, click, rub or gallop  Abdomen: soft, non-tender; bowel sounds decreased; no  masses, no organomegaly  Ext: no calf swelling or edema.    Procedures/Imaging:    Dg Ercp With Sphincterotomy  01/08/2012  IMPRESSION: Borderline common and intrahepatic ductal dilatation.  No focal filling defect identified.  These images were submitted for radiologic interpretation only. Please see the procedural report for the amount of contrast and the fluoroscopy time utilized.       Labs  CBC  Lab 01/09/12 0553 01/08/12 1813 01/07/12 2357  WBC 8.3 9.1 9.3  HGB 12.6 13.3 13.0  HCT 37.3 37.7 37.1  PLT 293 282 287   BMET  Lab 01/10/12 0932 01/09/12 0553 01/08/12 1813  NA 137 136 135  K 3.9 4.5 4.4  CL 104 103 102  CO2 25 22 23   BUN 3* 6 7  CREATININE 0.81 0.67 0.76  CALCIUM 9.7 9.5 9.9  PROT 7.2 7.4 7.6  BILITOT 0.6 1.8* 1.5*  ALKPHOS 163* 184* 156*  ALT 496* 872* 619*  AST 214* 1100* 1199*  GLUCOSE 81 115* 115*   Results for orders placed during the hospital encounter of 01/07/12 (from the past 72 hour(s))  COMPREHENSIVE METABOLIC PANEL     Status: Abnormal   Collection Time   01/07/12 11:57 PM      Component Value Range Comment   Sodium 135  135 - 145 mEq/L    Potassium 4.0  3.5 - 5.1 mEq/L    Chloride 103  96 - 112 mEq/L    CO2 24  19 - 32 mEq/L    Glucose, Bld 109 (*) 70 - 99 mg/dL    BUN 7  6 -  23 mg/dL    Creatinine, Ser 1.61  0.50 - 1.10 mg/dL    Calcium 9.7  8.4 - 09.6 mg/dL    Total Protein 7.2  6.0 - 8.3 g/dL    Albumin 3.5  3.5 - 5.2 g/dL    AST 22  0 - 37 U/L    ALT 81 (*) 0 - 35 U/L    Alkaline Phosphatase 88  39 - 117 U/L    Total Bilirubin 0.4  0.3 - 1.2 mg/dL    GFR calc non Af Amer 82 (*) >90 mL/min    GFR calc Af Amer >90  >90 mL/min   LIPASE, BLOOD     Status: Normal   Collection Time   01/07/12 11:57 PM      Component Value Range Comment   Lipase 23  11 - 59 U/L   CBC     Status: Normal   Collection Time   01/07/12 11:57 PM      Component Value Range Comment   WBC 9.3  4.0 - 10.5 K/uL    RBC 4.31  3.87 - 5.11 MIL/uL    Hemoglobin  13.0  12.0 - 15.0 g/dL    HCT 04.5  40.9 - 81.1 %    MCV 86.1  78.0 - 100.0 fL    MCH 30.2  26.0 - 34.0 pg    MCHC 35.0  30.0 - 36.0 g/dL    RDW 91.4  78.2 - 95.6 %    Platelets 287  150 - 400 K/uL   MAGNESIUM     Status: Normal   Collection Time   01/08/12 10:14 AM      Component Value Range Comment   Magnesium 2.1  1.5 - 2.5 mg/dL   CBC     Status: Normal   Collection Time   01/08/12  6:13 PM      Component Value Range Comment   WBC 9.1  4.0 - 10.5 K/uL    RBC 4.38  3.87 - 5.11 MIL/uL    Hemoglobin 13.3  12.0 - 15.0 g/dL    HCT 21.3  08.6 - 57.8 %    MCV 86.1  78.0 - 100.0 fL    MCH 30.4  26.0 - 34.0 pg    MCHC 35.3  30.0 - 36.0 g/dL    RDW 46.9  62.9 - 52.8 %    Platelets 282  150 - 400 K/uL   COMPREHENSIVE METABOLIC PANEL     Status: Abnormal   Collection Time   01/08/12  6:13 PM      Component Value Range Comment   Sodium 135  135 - 145 mEq/L    Potassium 4.4  3.5 - 5.1 mEq/L    Chloride 102  96 - 112 mEq/L    CO2 23  19 - 32 mEq/L    Glucose, Bld 115 (*) 70 - 99 mg/dL    BUN 7  6 - 23 mg/dL    Creatinine, Ser 4.13  0.50 - 1.10 mg/dL    Calcium 9.9  8.4 - 24.4 mg/dL    Total Protein 7.6  6.0 - 8.3 g/dL    Albumin 3.7  3.5 - 5.2 g/dL    AST 0102 (*) 0 - 37 U/L    ALT 619 (*) 0 - 35 U/L    Alkaline Phosphatase 156 (*) 39 - 117 U/L    Total Bilirubin 1.5 (*) 0.3 - 1.2 mg/dL    GFR calc non Af Amer >90  >  90 mL/min    GFR calc Af Amer >90  >90 mL/min   LIPASE, BLOOD     Status: Abnormal   Collection Time   01/08/12  6:13 PM      Component Value Range Comment   Lipase 1363 (*) 11 - 59 U/L   CBC     Status: Normal   Collection Time   01/09/12  5:53 AM      Component Value Range Comment   WBC 8.3  4.0 - 10.5 K/uL    RBC 4.28  3.87 - 5.11 MIL/uL    Hemoglobin 12.6  12.0 - 15.0 g/dL    HCT 16.1  09.6 - 04.5 %    MCV 87.1  78.0 - 100.0 fL    MCH 29.4  26.0 - 34.0 pg    MCHC 33.8  30.0 - 36.0 g/dL    RDW 40.9  81.1 - 91.4 %    Platelets 293  150 - 400 K/uL     COMPREHENSIVE METABOLIC PANEL     Status: Abnormal   Collection Time   01/09/12  5:53 AM      Component Value Range Comment   Sodium 136  135 - 145 mEq/L    Potassium 4.5  3.5 - 5.1 mEq/L    Chloride 103  96 - 112 mEq/L    CO2 22  19 - 32 mEq/L    Glucose, Bld 115 (*) 70 - 99 mg/dL    BUN 6  6 - 23 mg/dL    Creatinine, Ser 7.82  0.50 - 1.10 mg/dL    Calcium 9.5  8.4 - 95.6 mg/dL    Total Protein 7.4  6.0 - 8.3 g/dL    Albumin 3.6  3.5 - 5.2 g/dL    AST 2130 (*) 0 - 37 U/L    ALT 872 (*) 0 - 35 U/L    Alkaline Phosphatase 184 (*) 39 - 117 U/L    Total Bilirubin 1.8 (*) 0.3 - 1.2 mg/dL    GFR calc non Af Amer >90  >90 mL/min    GFR calc Af Amer >90  >90 mL/min   LIPASE, BLOOD     Status: Abnormal   Collection Time   01/09/12  5:53 AM      Component Value Range Comment   Lipase 290 (*) 11 - 59 U/L   LIPASE, BLOOD     Status: Normal   Collection Time   01/10/12  9:32 AM      Component Value Range Comment   Lipase 44  11 - 59 U/L   COMPREHENSIVE METABOLIC PANEL     Status: Abnormal   Collection Time   01/10/12  9:32 AM      Component Value Range Comment   Sodium 137  135 - 145 mEq/L    Potassium 3.9  3.5 - 5.1 mEq/L    Chloride 104  96 - 112 mEq/L    CO2 25  19 - 32 mEq/L    Glucose, Bld 81  70 - 99 mg/dL    BUN 3 (*) 6 - 23 mg/dL    Creatinine, Ser 8.65  0.50 - 1.10 mg/dL    Calcium 9.7  8.4 - 78.4 mg/dL    Total Protein 7.2  6.0 - 8.3 g/dL    Albumin 3.6  3.5 - 5.2 g/dL    AST 696 (*) 0 - 37 U/L    ALT 496 (*) 0 - 35 U/L    Alkaline Phosphatase  163 (*) 39 - 117 U/L    Total Bilirubin 0.6  0.3 - 1.2 mg/dL    GFR calc non Af Amer 88 (*) >90 mL/min    GFR calc Af Amer >90  >90 mL/min        Patient condition at time of discharge/disposition: stable  Disposition-home   Follow up issues: 1. Patient with recurrent abdominal pain, possibly needs further outpatient workup for this issue  Discharge follow up:  Patient is to follow-up with her PCP at [facility] within 1-2  weeks   Discharge Instructions: Please refer to Patient Instructions section of EMR for full details.  Patient was counseled important signs and symptoms that should prompt return to medical care, changes in medications, dietary instructions, activity restrictions, and follow up appointments.  Significant instructions noted below:   Discharge Medications   Medication List     As of 01/12/2012  2:06 PM    START taking these medications         HYDROcodone-acetaminophen 5-325 MG per tablet   Commonly known as: NORCO/VICODIN   Take 1 tablet by mouth every 4 (four) hours as needed.      polyethylene glycol packet   Commonly known as: MIRALAX / GLYCOLAX   Take 17 g by mouth daily.      CONTINUE taking these medications         albuterol 108 (90 BASE) MCG/ACT inhaler   Commonly known as: PROVENTIL HFA;VENTOLIN HFA      diclofenac 75 MG EC tablet   Commonly known as: VOLTAREN      Fluticasone-Salmeterol 250-50 MCG/DOSE Aepb   Commonly known as: ADVAIR      levocetirizine 5 MG tablet   Commonly known as: XYZAL      magnesium gluconate 500 MG tablet   Commonly known as: MAGONATE      pantoprazole 40 MG tablet   Commonly known as: PROTONIX   Take 1 tablet (40 mg total) by mouth daily at 12 noon.      PRESCRIPTION MEDICATION          Where to get your medications    These are the prescriptions that you need to pick up. We sent them to a specific pharmacy, so you will need to go there to get them.   [id number] - [place] ([place]), [place] - [address]    [address] [place] ([place]) [place] [address]    Phone: [phone]        polyethylene glycol packet         You may get these medications from any pharmacy.         HYDROcodone-acetaminophen 5-325 MG per tablet           Marikay Alar, MD of [facility] 01/12/2012 2:11 PM

## 2012-01-11 ENCOUNTER — Encounter (HOSPITAL_COMMUNITY): Payer: Self-pay | Admitting: Gastroenterology

## 2012-01-11 ENCOUNTER — Encounter (HOSPITAL_COMMUNITY): Payer: Self-pay

## 2012-01-11 LAB — CBC
HCT: 34.5 % — ABNORMAL LOW (ref 36.0–46.0)
MCHC: 35.1 g/dL (ref 30.0–36.0)
Platelets: 255 10*3/uL (ref 150–400)
RDW: 13.1 % (ref 11.5–15.5)
WBC: 6.3 10*3/uL (ref 4.0–10.5)

## 2012-01-11 LAB — HEPATIC FUNCTION PANEL
ALT: 339 U/L — ABNORMAL HIGH (ref 0–35)
Alkaline Phosphatase: 143 U/L — ABNORMAL HIGH (ref 39–117)
Bilirubin, Direct: 0.2 mg/dL (ref 0.0–0.3)
Indirect Bilirubin: 0.4 mg/dL (ref 0.3–0.9)
Total Bilirubin: 0.6 mg/dL (ref 0.3–1.2)
Total Protein: 7 g/dL (ref 6.0–8.3)

## 2012-01-11 MED ORDER — POLYETHYLENE GLYCOL 3350 17 G PO PACK: 17.0000 g | PACK | Freq: Every day | ORAL | Status: DC

## 2012-01-11 MED ORDER — HYDROMORPHONE HCL PF 1 MG/ML IJ SOLN
0.5000 mg | Freq: Once | INTRAMUSCULAR | Status: AC
  Administered 2012-01-11: 0.5 mg via INTRAVENOUS

## 2012-01-11 MED ORDER — HYDROCODONE-ACETAMINOPHEN 5-325 MG PO TABS: 1.0000 | ORAL_TABLET | ORAL | Status: DC | PRN

## 2012-01-11 MED ORDER — HYDROMORPHONE HCL PF 1 MG/ML IJ SOLN
INTRAMUSCULAR | Status: AC
  Filled 2012-01-11: qty 1

## 2012-01-11 NOTE — Progress Notes (Signed)
Discharge instructions/Med Rec Sheet reviewed w/ pt. Pt expressed understanding and copies given w/ prescriptions. Pt d/c'd in stable condition via w/c, accompanied by NT  

## 2012-01-11 NOTE — Progress Notes (Signed)
Patient ID: Kendra Gonzalez, female   DOB: 1971-01-25, 41 y.o.   MRN: [id number] Subjective: Patient states doing well.  Complains of minimal pain today that is controlled by vicodin.  Tried to advance diet yesterday and patient had increased abdominal pain following eating.  Received on dose of Dilaudid IV with improvement in pain. No issues with pain since yesterday.  Objective: Vital signs in last 24 hours: Temp:  [98.2 F (36.8 C)-98.4 F (36.9 C)] 98.4 F (36.9 C) (09/16 0551) Pulse Rate:  [72-93] 72  (09/16 0551) Resp:  [15-18] 15  (09/16 0551) BP: (122-140)/(65-81) 122/65 mmHg (09/16 0551) SpO2:  [99 %-100 %] 99 % (09/16 0551)  Intake/Output from previous day: 09/15 0701 - 09/16 0700 In: 3528 [I.V.:3528] Out: -  Intake/Output this shift:   General appearance: alert, cooperative and no distress Lungs: clear to auscultation bilaterally Heart: regular rate and rhythm, S1, S2 normal, no murmur, click, rub or gallop Abdomen: soft, non-tender; bowel sounds decreased; no masses,  no organomegaly Ext: no calf swelling or edema.   Pertinent Labs:  WBC 8.3 AST 22 >1199>1100>214 ALT 81>619>872>496 Lipase 23>1363>290>44 T Bili  0.4>1.5 Alk Phos 88 >156>184  Studies/Results: Dg Ercp With Sphincterotomy  01/08/2012 IMPRESSION: Borderline common and intrahepatic ductal dilatation.  No focal filling defect identified.  These images were submitted for radiologic interpretation only. Please see the procedural report for the amount of contrast and the fluoroscopy time utilized.      Medications: reviewed.   Assessment/Plan: [name] admitted with worsening epigastric pain subsequently developed mild pancreatitis following ERCP with sphincterotomy.   1. Epigastric pain now secondary to pancreatitis after ERCP the initial etiology of the pain is still unknown but passed common bile duct stone is most likely. Pain improved this morning.  -IVF 150/hr.  -trend LFTs, CBC and lipase. All  trending down.  -discontinued dilaudid and transitioned to vicodin PO for pain control.  -advanced diet to bland yesterday-patient did not tolerate this yesterday and switched back to clear liquids-will advance diet as tolerated today -continue zofran prn nausea.  -continue MOM and senokot-S to maintain normal bowels.   2. Transient HTN: attribute to pain. Now resolved.   3. FEN/GI: NS 150/hr  Clear liquid diet. Advance diet as tolerated.   4. DVT PPx: heparin.   5. Code: Full.  6. Dispo: Clinically improved.  Possibly home later today if pain controlled on PO medications and tolerates diet.   LOS: 4 days   Marikay Alar 7:16 AM, 01/11/12 Pager [phone]

## 2012-01-11 NOTE — Progress Notes (Signed)
I have seen and examined this patient. I have discussed with Dr Birdie Sons.  I agree with their findings and plans as documented in their progress note.   Kendra Gonzalez is feeling somewhat better.  Tolerated clear diet this afternoon.  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

## 2012-01-13 NOTE — Discharge Summary (Signed)
I discussed with Dr Sonnenberg.  I agree with their plans documented in their progress note for today.  

## 2012-01-18 ENCOUNTER — Other Ambulatory Visit: Payer: Self-pay | Admitting: Family Medicine

## 2012-01-18 DIAGNOSIS — Z1231 Encounter for screening mammogram for malignant neoplasm of breast: Secondary | ICD-10-CM

## 2012-02-25 ENCOUNTER — Ambulatory Visit
Admission: RE | Admit: 2012-02-25 | Discharge: 2012-02-25 | Disposition: A | Discharge status: Home / Self Care | Payer: 59 | Source: Ambulatory Visit | Attending: Family Medicine | Admitting: Family Medicine

## 2012-02-25 DIAGNOSIS — Z1231 Encounter for screening mammogram for malignant neoplasm of breast: Secondary | ICD-10-CM

## 2012-02-26 ENCOUNTER — Other Ambulatory Visit: Payer: Self-pay | Admitting: Family Medicine

## 2012-02-26 ENCOUNTER — Telehealth: Payer: Self-pay | Admitting: *Deleted

## 2012-02-26 DIAGNOSIS — R928 Other abnormal and inconclusive findings on diagnostic imaging of breast: Secondary | ICD-10-CM

## 2012-02-26 DIAGNOSIS — N644 Mastodynia: Secondary | ICD-10-CM

## 2012-02-26 NOTE — Telephone Encounter (Signed)
Per Dr. Patsy Lager: I called Ms.Warning to follow up regarding her mammogram. The mammogram indicated a possible mass in the left breast and further testing is needed, possibly an ultrasound. Ms.Renville indicated that she has the mammogram yesterday and had not received the results at this time. Because she was unaware of the results, I just let her know that if she had any questions or concerns that Dr.Copland could assist her with, to please call the office.

## 2012-02-28 ENCOUNTER — Telehealth: Payer: Self-pay

## 2012-02-28 NOTE — Telephone Encounter (Signed)
Pt is experiencing sharp pain in her breast she has an appt thurs with the breast center and would like to be advised on if she should come in or wait til her appt Thursday.

## 2012-02-28 NOTE — Telephone Encounter (Signed)
patient notified to return to clinic.

## 2012-02-29 ENCOUNTER — Ambulatory Visit (INDEPENDENT_AMBULATORY_CARE_PROVIDER_SITE_OTHER): Payer: 59 | Admitting: Family Medicine

## 2012-02-29 ENCOUNTER — Ambulatory Visit: Payer: 59

## 2012-02-29 VITALS — BP 120/84 | HR 86 | Temp 97.9°F | Resp 18 | Ht 64.5 in | Wt 189.2 lb

## 2012-02-29 DIAGNOSIS — N644 Mastodynia: Secondary | ICD-10-CM

## 2012-02-29 MED ORDER — TRAMADOL HCL 50 MG PO TABS: 50.0000 mg | ORAL_TABLET | Freq: Three times a day (TID) | ORAL | Status: DC | PRN

## 2012-02-29 NOTE — Progress Notes (Signed)
[facility] [address], [place] [address] [phone]  Date:  02/29/2012   Name:  Kendra Gonzalez   DOB:  Nov 30, 1970   MRN:  [id number]  PCP:  Virgilio Belling    Chief Complaint: Breast Pain   History of Present Illness:  Kendra Gonzalez is a 41 y.o. very pleasant female patient who presents with the following:  She is here today to evaluate a breast problem- she has noted pain in her RIGHT breast.  Her mammogram last week showed the following:  DIGITAL BILATERAL SCREENING MAMMOGRAM WITH CAD  Comparison: Previous exams.  Findings: Two views of each breast demonstrate scattered  fibroglandular densities. In the left breast, a possible mass  warrants further evaluation with spot compression views and  possibly ultrasound. In the right breast, no masses or malignant  type calcifications are identified.  Images were processed with CAD.  IMPRESSION:  Further evaluation is suggested for possible mass in the left  breast.  RECOMMENDATION:  Diagnostic mammogram and possibly ultrasound of the left breast.  (Code:FI-L-46M)  BI-RADS CATEGORY 0: Incomplete. Need additional imaging  evaluation and/or prior mammograms for comparison.  She has an appt this Thursday to follow- up this issue in her LEFT breast.  She has not noted any symptoms in her left breast, but in the right she has noted pains for about a week- she notes a sharp pain which has been more frequent for the last couple of days. She had noted occasional pains for a couple of weeks.  Her symptoms got worse after her mammogram last week.   The pain is non- exertional.  It can last for 5 minutes or so.  She notes this "on and off all day" for the last 2 days.  No nipple discharge, no skin puckering, no injury.   No cough, no fever, no night sweats.  She has maybe lost a few lbs but she has been trying to lose weight.  History of partial hysterectomy- she still has one ovary.  She has not had  any SOB, does not take hormones, and does not smoke.  She has no history of heart problems.   She also had some health problems this last September- she had a CBD stone and then had an ERCP which led to pancreatitis.  She is recovering from this problem.    Patient Active Problem List  Diagnosis  . CANDIDIASIS, VULVA/VAGINA  . HYPERLIPIDEMIA  . ALLERGIC RHINITIS  . GERD  . GLUCOSE INTOLERANCE, HX OF  . Obesity (BMI 30.0-34.9)  . Abdominal pain, acute    Past Medical History  Diagnosis Date  . Endometriosis   . Hyperlipidemia   . Kidney stones   . Asthma     Past Surgical History  Procedure Date  . Cholecystectomy   . Abdominal hysterectomy   . Oophrectomy 2011    secondary to cysts  . Carpel tunnel   . Arthroscopic knee surgery    . Hip surgery   . Ercp 01/08/2012    Procedure: ENDOSCOPIC RETROGRADE CHOLANGIOPANCREATOGRAPHY (ERCP);  Surgeon: Theda Belfast, MD;  Location: [facility] ENDOSCOPY;  Service: Endoscopy;  Laterality: N/A;    History  Substance Use Topics  . Smoking status: Never Smoker   . Smokeless tobacco: Never Used  . Alcohol Use: No    Family History  Problem Relation Age of Onset  . Hypertension Other     No Known Allergies  Medication list has been reviewed and updated.  Current Outpatient Prescriptions on File Prior to Visit  Medication Sig Dispense Refill  . albuterol (PROVENTIL HFA;VENTOLIN HFA) 108 (90 BASE) MCG/ACT inhaler Inhale 2 puffs into the lungs every 6 (six) hours as needed. For shortness of breath or coughing      . diclofenac (VOLTAREN) 75 MG EC tablet Take 75 mg by mouth at bedtime.      . Fluticasone-Salmeterol (ADVAIR) 250-50 MCG/DOSE AEPB Inhale 2 puffs into the lungs every 12 (twelve) hours.      Marland Kitchen HYDROcodone-acetaminophen (NORCO/VICODIN) 5-325 MG per tablet Take 1 tablet by mouth every 4 (four) hours as needed.  30 tablet  0  . levocetirizine (XYZAL) 5 MG tablet Take 5 mg by mouth every evening.      . magnesium gluconate  (MAGONATE) 500 MG tablet Take 500 mg by mouth at bedtime.      . pantoprazole (PROTONIX) 40 MG tablet Take 1 tablet (40 mg total) by mouth daily at 12 noon.  30 tablet  0  . polyethylene glycol (MIRALAX / GLYCOLAX) packet Take 17 g by mouth daily.  14 each  0  . PRESCRIPTION MEDICATION once a week. Allergy shots at doctor's office once weekly        Review of Systems:  As per HPI- otherwise negative.   Physical Examination: Filed Vitals:   02/29/12 0750  BP: 120/84  Pulse: 86  Temp: 97.9 F (36.6 C)  Resp: 18   Filed Vitals:   02/29/12 0750  Height: 5' 4.5" (1.638 m)  Weight: 189 lb 3.2 oz (85.821 kg)   Body mass index is 31.97 kg/(m^2). Ideal Body Weight: Weight in (lb) to have BMI = 25: 147.6   GEN: WDWN, NAD, Non-toxic, A & O x 3, overweight HEENT: Atraumatic, Normocephalic. Neck supple. No masses, No LAD. Ears and Nose: No external deformity. CV: RRR, No M/G/R. No JVD. No thrill. No extra heart sounds. PULM: CTA B, no wheezes, crackles, rhonchi. No retractions. No resp. distress. No accessory muscle use. ABD: S, NT, ND, +BS. No rebound. No HSM. EXTR: No c/c/e NEURO Normal gait.  PSYCH: Normally interactive. Conversant. Not depressed or anxious appearing.  Calm demeanor.  Breast: I am able to reproduce her tenderness by pressing on her right breast at 7- 8:00.  No masses, discharge or dimpling.  Left breast is normal to my exam There is no apparent tenderness of her ribs or scapula on the right side.  No pain with manipulation/ movement of the right shoulder  EKG: NST, no ST elevation or depression.  Done on a different machine from her last EKG  [facility] reading (PRIMARY) by  Dr. Patsy Lager.  CXR: minimal degenerative change in T spine, otherwise normal. CHEST - 2 VIEW  Comparison: 07/13/2011 and preliminary reading of Dr. Patsy Lager  Findings: Cardiomediastinal silhouette is unremarkable. No acute infiltrate or pleural effusion. No pulmonary edema. Bony thorax is  unremarkable. Post cholecystectomy surgical clips are noted. Minimal degenerative changes lower thoracic spine.  IMPRESSION: No active disease.  Assessment and Plan: 1. Breast pain  DG Chest 2 View, EKG 12-Lead, traMADol (ULTRAM) 50 MG tablet   Called the breast center of GI and added a diagnostic mammogram to her right breast to the left breast study already planned for this Thursday.  Her pain does seem to be in her breast and is non- reproducible.  Her EKG and CXR as well as her history and VS are reassuring that there is no cardiopulmonary etiology.    She may use ultram  and hot compresses for the time being.  If not better or if getting worse please call me or RTC.  Otherwise await her mammogram scheduled for this Thursday.   Abbe Amsterdam, MD

## 2012-03-03 ENCOUNTER — Ambulatory Visit
Admission: RE | Admit: 2012-03-03 | Discharge: 2012-03-03 | Disposition: A | Discharge status: Home / Self Care | Payer: 59 | Source: Ambulatory Visit | Attending: Family Medicine | Admitting: Family Medicine

## 2012-03-03 ENCOUNTER — Encounter: Payer: Self-pay | Admitting: Internal Medicine

## 2012-03-03 ENCOUNTER — Ambulatory Visit: Payer: 59

## 2012-03-03 ENCOUNTER — Telehealth: Payer: Self-pay

## 2012-03-03 ENCOUNTER — Ambulatory Visit (INDEPENDENT_AMBULATORY_CARE_PROVIDER_SITE_OTHER): Payer: 59 | Admitting: Internal Medicine

## 2012-03-03 ENCOUNTER — Other Ambulatory Visit: Payer: Self-pay | Admitting: Family Medicine

## 2012-03-03 VITALS — BP 139/87 | HR 99 | Temp 98.2°F | Resp 16 | Ht 64.0 in | Wt 191.2 lb

## 2012-03-03 DIAGNOSIS — N644 Mastodynia: Secondary | ICD-10-CM

## 2012-03-03 DIAGNOSIS — R1013 Epigastric pain: Secondary | ICD-10-CM

## 2012-03-03 DIAGNOSIS — R928 Other abnormal and inconclusive findings on diagnostic imaging of breast: Secondary | ICD-10-CM

## 2012-03-03 DIAGNOSIS — R110 Nausea: Secondary | ICD-10-CM

## 2012-03-03 DIAGNOSIS — E739 Lactose intolerance, unspecified: Secondary | ICD-10-CM

## 2012-03-03 DIAGNOSIS — K859 Acute pancreatitis without necrosis or infection, unspecified: Secondary | ICD-10-CM

## 2012-03-03 DIAGNOSIS — E7439 Other disorders of intestinal carbohydrate absorption: Secondary | ICD-10-CM

## 2012-03-03 LAB — COMPREHENSIVE METABOLIC PANEL
ALT: 29 U/L (ref 0–35)
Albumin: 4.3 g/dL (ref 3.5–5.2)
CO2: 28 mEq/L (ref 19–32)
Calcium: 10 mg/dL (ref 8.4–10.5)
Chloride: 104 mEq/L (ref 96–112)
Glucose, Bld: 88 mg/dL (ref 70–99)
Potassium: 4.5 mEq/L (ref 3.5–5.3)
Sodium: 138 mEq/L (ref 135–145)
Total Protein: 7.4 g/dL (ref 6.0–8.3)

## 2012-03-03 LAB — POCT UA - MICROSCOPIC ONLY
RBC, urine, microscopic: NEGATIVE
WBC, Ur, HPF, POC: NEGATIVE
Yeast, UA: NEGATIVE

## 2012-03-03 LAB — POCT URINALYSIS DIPSTICK
Glucose, UA: NEGATIVE
Ketones, UA: NEGATIVE
Leukocytes, UA: NEGATIVE
Protein, UA: NEGATIVE
Spec Grav, UA: 1.01
Urobilinogen, UA: 0.2

## 2012-03-03 LAB — LIPASE: Lipase: 11 U/L (ref 0–75)

## 2012-03-03 LAB — POCT CBC
Granulocyte percent: 51.1 %G (ref 37–80)
Hemoglobin: 13 g/dL (ref 12.2–16.2)
MCH, POC: 28.8 pg (ref 27–31.2)
MCV: 92 fL (ref 80–97)
MID (cbc): 0.5 (ref 0–0.9)
MPV: 8.3 fL (ref 0–99.8)
Platelet Count, POC: 348 10*3/uL (ref 142–424)
RBC: 4.52 M/uL (ref 4.04–5.48)
WBC: 6.2 10*3/uL (ref 4.6–10.2)

## 2012-03-03 MED ORDER — OXYCODONE-ACETAMINOPHEN 5-325 MG PO TABS: 1.0000 | ORAL_TABLET | Freq: Three times a day (TID) | ORAL | Status: DC | PRN

## 2012-03-03 MED ORDER — PROMETHAZINE HCL 25 MG/ML IJ SOLN
50.0000 mg | Freq: Once | INTRAMUSCULAR | Status: AC
  Administered 2012-03-03: 50 mg via INTRAMUSCULAR

## 2012-03-03 MED ORDER — ONDANSETRON 4 MG PO TBDP
4.0000 mg | ORAL_TABLET | Freq: Once | ORAL | Status: AC
  Administered 2012-03-03: 8 mg via ORAL

## 2012-03-03 MED ORDER — HYDROCODONE-ACETAMINOPHEN 7.5-500 MG/15ML PO SOLN
10.0000 mL | Freq: Once | ORAL | Status: AC
  Administered 2012-03-03: 10 mL via ORAL

## 2012-03-03 NOTE — Patient Instructions (Signed)
Acute Pancreatitis  Acute pancreatitis is a disease in which the pancreas becomes suddenly inflamed. The pancreas is a large gland located behind your stomach. The pancreas produces enzymes that help digest food. The pancreas also releases the hormones glucagon and insulin that help regulate blood sugar. Damage to the pancreas occurs when the digestive enzymes from the pancreas are activated and begin attacking the pancreas before being released into the intestine. Most acute attacks last a couple of days and can cause serious complications. Some people become dehydrated and develop low blood pressure. In severe cases, bleeding into the pancreas can lead to shock and can be life-threatening. The lungs, heart, and kidneys may fail.  CAUSES   Pancreatitis can happen to anyone. In some cases, the cause is unknown. Most cases are caused by:   Alcohol abuse.   Gallstones.  Other less common causes are:   Certain medicines.   Exposure to certain chemicals.   Infection.   Damage caused by an accident (trauma).   Abdominal surgery.  SYMPTOMS    Pain in the upper abdomen that may radiate to the back.   Tenderness and swelling of the abdomen.   Nausea and vomiting.  DIAGNOSIS   Your caregiver will perform a physical exam. Blood and stool tests may be done to confirm the diagnosis. Imaging tests may also be done, such as X-rays, CT scans, or an ultrasound of the abdomen.  TREATMENT   Treatment usually requires a stay in the hospital. Treatment may include:   Pain medicine.   Fluid replacement through an intravenous line (IV).   Placing a tube in the stomach to remove stomach contents and control vomiting.   Not eating for 3 or 4 days. This gives your pancreas a rest, because enzymes are not being produced that can cause further damage.   Antibiotic medicines if your condition is caused by an infection.   Surgery of the pancreas or gallbladder.  HOME CARE INSTRUCTIONS    Follow the diet advised by your  caregiver. This may involve avoiding alcohol and decreasing the amount of fat in your diet.   Eat smaller, more frequent meals. This reduces the amount of digestive juices the pancreas produces.   Drink enough fluids to keep your urine clear or pale yellow.   Only take over-the-counter or prescription medicines as directed by your caregiver.   Avoid drinking alcohol if it caused your condition.   Do not smoke.   Get plenty of rest.   Check your blood sugar at home as directed by your caregiver.   Keep all follow-up appointments as directed by your caregiver.  SEEK MEDICAL CARE IF:    You do not recover as quickly as expected.   You develop new or worsening symptoms.   You have persistent pain, weakness, or nausea.   You recover and then have another episode of pain.  SEEK IMMEDIATE MEDICAL CARE IF:    You are unable to eat or keep fluids down.   Your pain becomes severe.   You have a fever or persistent symptoms for more than 2 to 3 days.   You have a fever and your symptoms suddenly get worse.   Your skin or the white part of your eyes turn yellow (jaundice).   You develop vomiting.   You feel dizzy, or you faint.   Your blood sugar is high (over 300 mg/dL).  MAKE SURE YOU:    Understand these instructions.   Will watch your condition.   

## 2012-03-03 NOTE — Progress Notes (Signed)
Subjective:    Patient ID: Kendra Gonzalez, female    DOB: 28-Sep-1970, 41 y.o.   MRN: [id number]  HPI Having chronic recurrent mid and left abdominal pain. Saw her GI doc ,Loreta Ave 2d ago. Today had acute onset of same pain, nausea, waxes and wains.Had neg ercp, ct pelvis and abd, [place]. Did have abnormal lfts. Had lipase over 1300 in September. Appears to be one attack of pancreatitis in past.   Review of Systems     Objective:   Physical Exam  Constitutional: She is oriented to person, place, and time. She appears well-nourished. She appears distressed.  HENT:  Right Ear: External ear normal.  Left Ear: External ear normal.  Nose: Nose normal.  Mouth/Throat: Oropharynx is clear and moist.  Eyes: EOM are normal. Pupils are equal, round, and reactive to light. No scleral icterus.  Neck: Normal range of motion. Neck supple.  Cardiovascular: Normal heart sounds.  A regularly irregular rhythm present. Tachycardia present.   Pulmonary/Chest: Breath sounds normal. Tachypnea noted. No respiratory distress.  Abdominal: Soft. She exhibits no distension, no ascites and no mass. Bowel sounds are decreased. There is no hepatosplenomegaly. There is tenderness. There is guarding and CVA tenderness. There is no rebound.    Neurological: She is alert and oriented to person, place, and time.  Skin: Skin is warm and dry.  Psychiatric: She has a normal mood and affect.   In acute pain, shaking, no fever  [facility] reading (PRIMARY) by  Dr.Camiyah Friberg.not sure what is wrong with her, see previous tests, ileus versus obstruction.  Zofran 8mg /lortab 2 tsp po now     Results for orders placed in visit on 03/03/12  POCT CBC      Component Value Range   WBC 6.2  4.6 - 10.2 K/uL   Lymph, poc 2.6  0.6 - 3.4   POC LYMPH PERCENT 41.5  10 - 50 %L   MID (cbc) 0.5  0 - 0.9   POC MID % 7.4  0 - 12 %M   POC Granulocyte 3.2  2 - 6.9   Granulocyte percent 51.1  37 - 80 %G   RBC 4.52  4.04 - 5.48 M/uL   Hemoglobin  13.0  12.2 - 16.2 g/dL   HCT, POC 40.9  81.1 - 47.9 %   MCV 92.0  80 - 97 fL   MCH, POC 28.8  27 - 31.2 pg   MCHC 31.3 (*) 31.8 - 35.4 g/dL   RDW, POC 91.4     Platelet Count, POC 348  142 - 424 K/uL   MPV 8.3  0 - 99.8 fL  GLUCOSE, POCT (MANUAL RESULT ENTRY)      Component Value Range   POC Glucose 80  70 - 99 mg/dl  POCT URINALYSIS DIPSTICK      Component Value Range   Color, UA yellow     Clarity, UA clear     Glucose, UA neg     Bilirubin, UA neg     Ketones, UA neg     Spec Grav, UA 1.010     Blood, UA neg     pH, UA 7.5     Protein, UA neg     Urobilinogen, UA 0.2     Nitrite, UA neg     Leukocytes, UA Negative    POCT UA - MICROSCOPIC ONLY      Component Value Range   WBC, Ur, HPF, POC neg     RBC, urine, microscopic neg  Bacteria, U Microscopic neg     Mucus, UA neg     Epithelial cells, urine per micros rare     Crystals, Ur, HPF, POC neg     Casts, Ur, LPF, POC neg     Yeast, UA neg    all labs nl, chemistries pending  Assessment & Plan:  Probable pancreatitis F/up Dr. Loreta Ave today Zofran/hc prn IM phenergan 50mg  now Oxycodone 5/325 po prn

## 2012-03-03 NOTE — Telephone Encounter (Signed)
Pt in office today and was referred to [facility] Dr. Logan Bores [facility], [facility] is requesting a verbal auth for patient to be seen in office due to severe pain, pt states she has an appt at later date. Pt seen by Dr. Perrin Maltese [facility] is [phone]. Pt can be reached at [phone].

## 2012-03-03 NOTE — Telephone Encounter (Signed)
patient notified and voiced understanding. States she is on the way to ER at [facility] now.

## 2012-03-03 NOTE — Telephone Encounter (Signed)
The patient called again to state that she was going to go to the hospital due to her pain level.  The patient stated that she is going to [facility] to be evaluated.  Please call the patient at [phone].

## 2012-03-03 NOTE — Telephone Encounter (Signed)
Spoke with Dr. Logan Bores office and they needed records sent for review because there next appt is not until January. Spoke with Dr. Perrin Maltese and said send records and he advised patient to go to ER if getting worse. Called patient and left message for her to return call.

## 2012-03-04 ENCOUNTER — Telehealth: Payer: Self-pay

## 2012-03-04 NOTE — Telephone Encounter (Signed)
Abdominal pain - wondering about IBS Test.  Has to take a laxative to use the bathroom.   [phone]

## 2012-03-05 ENCOUNTER — Telehealth: Payer: Self-pay

## 2012-03-05 NOTE — Telephone Encounter (Signed)
PATIENT STATES SHE WAS IN THE OFFICE ON Thursday TO SEE DR. Perrin Maltese FOR ABDOMINAL PAIN. HE GAVE HER OXYCODONE WHICH IS NOT HELPING HER PAIN AT ALL. SHE WANTS TO KNOW IF SHE SHOULD RETURN TO THE CENTER? SHE SAID SHE DOES NOT WANT ANY MORE PAIN MEDICATION. BEST PHONE [phone] (HOME)   PHARMACY CHOICE IS [facility] ON [address].  MBC

## 2012-03-05 NOTE — Telephone Encounter (Signed)
lmom that pt come in to be seen and that if we are closed when she calls back and she is in pain and cannot wait to come in until in the morning that she should go to the ER.

## 2012-03-06 ENCOUNTER — Ambulatory Visit (INDEPENDENT_AMBULATORY_CARE_PROVIDER_SITE_OTHER): Payer: 59 | Admitting: Internal Medicine

## 2012-03-06 VITALS — BP 115/72 | HR 91 | Temp 98.1°F | Resp 16 | Ht 64.5 in | Wt 195.0 lb

## 2012-03-06 DIAGNOSIS — R109 Unspecified abdominal pain: Secondary | ICD-10-CM

## 2012-03-06 LAB — POCT CBC
Granulocyte percent: 43.6 %G (ref 37–80)
HCT, POC: 39.4 % (ref 37.7–47.9)
Hemoglobin: 12.3 g/dL (ref 12.2–16.2)
POC Granulocyte: 2.4 (ref 2–6.9)
POC LYMPH PERCENT: 48.2 %L (ref 10–50)
RBC: 4.27 M/uL (ref 4.04–5.48)
RDW, POC: 13.9 %

## 2012-03-06 LAB — POCT SEDIMENTATION RATE: POCT SED RATE: 21 mm/hr (ref 0–22)

## 2012-03-06 NOTE — Patient Instructions (Addendum)
Irritable Bowel Syndrome  Irritable Bowel Syndrome (IBS) is caused by a disturbance of normal bowel function. Other terms used are spastic colon, mucous colitis, and irritable colon. It does not require surgery, nor does it lead to cancer. There is no cure for IBS. But with proper diet, stress reduction, and medication, you will find that your problems (symptoms) will gradually disappear or improve. IBS is a common digestive disorder. It usually appears in late adolescence or early adulthood. Women develop it twice as often as men.  CAUSES   After food has been digested and absorbed in the small intestine, waste material is moved into the colon (large intestine). In the colon, water and salts are absorbed from the undigested products coming from the small intestine. The remaining residue, or fecal material, is held for elimination. Under normal circumstances, gentle, rhythmic contractions on the bowel walls push the fecal material along the colon towards the rectum. In IBS, however, these contractions are irregular and poorly coordinated. The fecal material is either retained too long, resulting in constipation, or expelled too soon, producing diarrhea.  SYMPTOMS   The most common symptom of IBS is pain. It is typically in the lower left side of the belly (abdomen). But it may occur anywhere in the abdomen. It can be felt as heartburn, backache, or even as a dull pain in the arms or shoulders. The pain comes from excessive bowel-muscle spasms and from the buildup of gas and fecal material in the colon. This pain:   Can range from sharp belly (abdominal) cramps to a dull, continuous ache.   Usually worsens soon after eating.   Is typically relieved by having a bowel movement or passing gas.  Abdominal pain is usually accompanied by constipation. But it may also produce diarrhea. The diarrhea typically occurs right after a meal or upon arising in the morning. The stools are typically soft and watery. They are often  flecked with secretions (mucus).  Other symptoms of IBS include:   Bloating.   Loss of appetite.   Heartburn.   Feeling sick to your stomach (nausea).   Belching   Vomiting   Gas.  IBS may also cause a number of symptoms that are unrelated to the digestive system:   Fatigue.   Headaches.   Anxiety   Shortness of breath   Difficulty in concentrating.   Dizziness.  These symptoms tend to come and go.  DIAGNOSIS   The symptoms of IBS closely mimic the symptoms of other, more serious digestive disorders. So your caregiver may wish to perform a variety of additional tests to exclude these disorders. He/she wants to be certain of learning what is wrong (diagnosis). The nature and purpose of each test will be explained to you.  TREATMENT  A number of medications are available to help correct bowel function and/or relieve bowel spasms and abdominal pain. Among the drugs available are:   Mild, non-irritating laxatives for severe constipation and to help restore normal bowel habits.   Specific anti-diarrheal medications to treat severe or prolonged diarrhea.   Anti-spasmodic agents to relieve intestinal cramps.   Your caregiver may also decide to treat you with a mild tranquilizer or sedative during unusually stressful periods in your life.  The important thing to remember is that if any drug is prescribed for you, make sure that you take it exactly as directed. Make sure that your caregiver knows how well it worked for you.  HOME CARE INSTRUCTIONS    Avoid foods that   are high in fat or oils. Some examples are:heavy cream, butter, frankfurters, sausage, and other fatty meats.   Avoid foods that have a laxative effect, such as fruit, fruit juice, and dairy products.   Cut out carbonated drinks, chewing gum, and "gassy" foods, such as beans and cabbage. This may help relieve bloating and belching.   Bran taken with plenty of liquids may help relieve constipation.   Keep track of what foods seem to trigger  your symptoms.   Avoid emotionally charged situations or circumstances that produce anxiety.   Start or continue exercising.   Get plenty of rest and sleep.  MAKE SURE YOU:    Understand these instructions.   Will watch your condition.   Will get help right away if you are not doing well or get worse.  Document Released: 04/13/2005 Document Revised: 07/06/2011 Document Reviewed: 12/02/2007  Exit[employer] Patient Information 2013 [employer].

## 2012-03-06 NOTE — Progress Notes (Signed)
  Subjective:    Patient ID: Kendra Gonzalez, female    DOB: 26-Jul-1970, 41 y.o.   MRN: [id number]  HPI Sub acute abd pain persisting. Today better but only if takes pain meds. No anorexia, fever, nausea, diarrhea [facility] ER did full eval, testing, ct all neg 2d ago Review of Systems     Objective:   Physical Exam  Constitutional: She is oriented to person, place, and time. She appears well-developed and well-nourished. No distress.  HENT:  Nose: Nose normal.  Mouth/Throat: Oropharynx is clear and moist.  Eyes: EOM are normal. No scleral icterus.  Cardiovascular: Normal rate, regular rhythm and normal heart sounds.   Pulmonary/Chest: Effort normal and breath sounds normal.  Abdominal: Normal appearance. She exhibits no distension and no mass. Bowel sounds are increased. There is no hepatosplenomegaly. There is tenderness. There is no rigidity, no rebound, no guarding and no CVA tenderness.  Musculoskeletal: Normal range of motion.  Neurological: She is alert and oriented to person, place, and time.  Skin: Skin is warm and dry.  Psychiatric: She has a normal mood and affect.     Results for orders placed in visit on 03/06/12  POCT CBC      Component Value Range   WBC 5.4  4.6 - 10.2 K/uL   Lymph, poc 2.6  0.6 - 3.4   POC LYMPH PERCENT 48.2  10 - 50 %L   MID (cbc) 0.4  0 - 0.9   POC MID % 8.2  0 - 12 %M   POC Granulocyte 2.4  2 - 6.9   Granulocyte percent 43.6  37 - 80 %G   RBC 4.27  4.04 - 5.48 M/uL   Hemoglobin 12.3  12.2 - 16.2 g/dL   HCT, POC 13.2  44.0 - 47.9 %   MCV 92.2  80 - 97 fL   MCH, POC 28.8  27 - 31.2 pg   MCHC 31.2 (*) 31.8 - 35.4 g/dL   RDW, POC 10.2     Platelet Count, POC 347  142 - 424 K/uL   MPV 8.5  0 - 99.8 fL   Sed rate     Assessment & Plan:  IBS w/up with Dr. Loreta Ave.. Miralax/hc

## 2012-03-06 NOTE — Telephone Encounter (Signed)
PT WILL BE COMING INTO OFFICE TODAY

## 2012-03-27 ENCOUNTER — Telehealth: Payer: Self-pay

## 2012-03-27 NOTE — Telephone Encounter (Signed)
PT IS RETURNING OUR CALL

## 2012-03-27 NOTE — Telephone Encounter (Signed)
Advised pt that there were no message that were current.  She think that the message was old.

## 2012-05-20 ENCOUNTER — Telehealth: Payer: Self-pay | Admitting: *Deleted

## 2012-05-20 NOTE — Telephone Encounter (Signed)
Pharmacy requesting refill on boric acid suppositories.  Last fill on 02/16/12

## 2012-05-23 NOTE — Telephone Encounter (Signed)
Pt stated that the boric acid is working well for her. She does use it at least once a month because if she doesn't, she feels the Sxs starting again. As per OV notes, she also sometimes uses after sexual intercourse as well.  Can we RF this?

## 2012-05-23 NOTE — Telephone Encounter (Signed)
How frequently is pt using?  Last note about this is Feb 2013 indicated pt to use once monthly.  Want to make sure this is how she is using it.

## 2012-05-24 MED ORDER — AMBULATORY NON FORMULARY MEDICATION: Status: DC

## 2012-05-24 NOTE — Telephone Encounter (Signed)
I have refilled, but pt needs OV when this runs out.  Looks like this problem was last discussed Feb 2013

## 2012-05-24 NOTE — Telephone Encounter (Signed)
Notified pt of RF and need for OV before she needs more. Pt agreed.

## 2012-05-30 ENCOUNTER — Ambulatory Visit (INDEPENDENT_AMBULATORY_CARE_PROVIDER_SITE_OTHER): Payer: 59 | Admitting: Family Medicine

## 2012-05-30 ENCOUNTER — Ambulatory Visit: Payer: 59

## 2012-05-30 VITALS — BP 133/84 | HR 88 | Temp 98.0°F | Resp 16 | Ht 64.0 in | Wt 199.4 lb

## 2012-05-30 DIAGNOSIS — M549 Dorsalgia, unspecified: Secondary | ICD-10-CM

## 2012-05-30 LAB — POCT URINALYSIS DIPSTICK
Bilirubin, UA: NEGATIVE
Blood, UA: NEGATIVE
Glucose, UA: NEGATIVE
Leukocytes, UA: NEGATIVE
Nitrite, UA: NEGATIVE
Protein, UA: NEGATIVE
Spec Grav, UA: 1.025
Urobilinogen, UA: 0.2
pH, UA: 6.5

## 2012-05-30 LAB — POCT UA - MICROSCOPIC ONLY
Casts, Ur, LPF, POC: NEGATIVE
Crystals, Ur, HPF, POC: NEGATIVE
Yeast, UA: NEGATIVE

## 2012-05-30 MED ORDER — CYCLOBENZAPRINE HCL 10 MG PO TABS: 10.0000 mg | ORAL_TABLET | Freq: Every evening | ORAL | Status: DC | PRN

## 2012-05-30 MED ORDER — OXYCODONE-ACETAMINOPHEN 5-325 MG PO TABS: 1.0000 | ORAL_TABLET | Freq: Three times a day (TID) | ORAL | Status: DC | PRN

## 2012-05-30 MED ORDER — PREDNISONE 20 MG PO TABS: ORAL_TABLET | ORAL | Status: DC

## 2012-05-30 NOTE — Progress Notes (Signed)
[facility] [address] [place] [address] [phone]  Date:  05/30/2012   Name:  Kendra Gonzalez   DOB:  Jun 20, 1970   MRN:  [id number]  PCP:  Virgilio Belling    Chief Complaint: Back Pain   History of Present Illness:  Kendra Gonzalez is a 42 y.o. very pleasant female patient who presents with the following:  She has noted intermittent back pain for about one week.  It now hurts the most on the right side.   She is not aware of any particular injury.  She has been told that she had kidney stones- discovered incidentally in the past- but she has never had any stone symptoms.   She notes a "dull, sharp pain" in the right lower back and middle of her back.  No radiation down her right leg.  She has noted some numbness and weakness down her left leg- she has a known bulging disc and has had these symptoms for about 2 years.  This is being observed by her surgeon.   She has tried some oxycodone that she had at home for her pain.  She also had some flexeril left over- neither of these seemed to help much- "would just take the edge off" .  The flexeril does help her to sleep at night.  She has used this at night and oxycodone during the day  No incontinence  Patient Active Problem List  Diagnosis  . CANDIDIASIS, VULVA/VAGINA  . HYPERLIPIDEMIA  . ALLERGIC RHINITIS  . GERD  . GLUCOSE INTOLERANCE, HX OF  . Obesity (BMI 30.0-34.9)  . Abdominal pain, acute    Past Medical History  Diagnosis Date  . Endometriosis   . Hyperlipidemia   . Kidney stones   . Asthma   . Allergy     Past Surgical History  Procedure Date  . Cholecystectomy   . Abdominal hysterectomy   . Oophrectomy 2011    secondary to cysts  . Carpel tunnel   . Arthroscopic knee surgery    . Hip surgery   . Ercp 01/08/2012    Procedure: ENDOSCOPIC RETROGRADE CHOLANGIOPANCREATOGRAPHY (ERCP);  Surgeon: Theda Belfast, MD;  Location: [facility] ENDOSCOPY;  Service: Endoscopy;  Laterality:  N/A;    History  Substance Use Topics  . Smoking status: Never Smoker   . Smokeless tobacco: Never Used  . Alcohol Use: No    Family History  Problem Relation Age of Onset  . Hypertension Other   . Heart disease Daughter     No Known Allergies  Medication list has been reviewed and updated.  Current Outpatient Prescriptions on File Prior to Visit  Medication Sig Dispense Refill  . albuterol (PROVENTIL HFA;VENTOLIN HFA) 108 (90 BASE) MCG/ACT inhaler Inhale 2 puffs into the lungs every 6 (six) hours as needed. For shortness of breath or coughing      . AMBULATORY NON FORMULARY MEDICATION Boric Acid Suppository 600 mg Insert 1 PV twice weekly to maintain vaginal flora  15 suppository  0  . Fluticasone-Salmeterol (ADVAIR) 250-50 MCG/DOSE AEPB Inhale 2 puffs into the lungs every 12 (twelve) hours.      Marland Kitchen levocetirizine (XYZAL) 5 MG tablet Take 5 mg by mouth every evening.      . magnesium gluconate (MAGONATE) 500 MG tablet Take 500 mg by mouth at bedtime.      Marland Kitchen oxyCODONE-acetaminophen (ROXICET) 5-325 MG per tablet Take 1 tablet by mouth every 8 (eight) hours as needed for pain.  20 tablet  0  . pantoprazole (PROTONIX) 40 MG tablet Take 1 tablet (40 mg total) by mouth daily at 12 noon.  30 tablet  0  . PRESCRIPTION MEDICATION once a week. Allergy shots at doctor's office once weekly      . traMADol (ULTRAM) 50 MG tablet Take 1 tablet (50 mg total) by mouth every 8 (eight) hours as needed for pain.  30 tablet  0    Review of Systems:  As per HPI- otherwise negative.  Physical Examination: Filed Vitals:   05/30/12 1205  BP: 133/84  Pulse: 88  Temp: 98 F (36.7 C)  Resp: 16   Filed Vitals:   05/30/12 1205  Height: 5\' 4"  (1.626 m)  Weight: 199 lb 6.4 oz (90.447 kg)   Body mass index is 34.23 kg/(m^2). Ideal Body Weight: Weight in (lb) to have BMI = 25: 145.3   GEN: WDWN, NAD, Non-toxic, A & O x 3, obese HEENT: Atraumatic, Normocephalic. Neck supple. No masses, No  LAD. Ears and Nose: No external deformity. CV: RRR, No M/G/R. No JVD. No thrill. No extra heart sounds. PULM: CTA B, no wheezes, crackles, rhonchi. No retractions. No resp. distress. No accessory muscle use. EXTR: No c/c/e NEURO Normal gait.  PSYCH: Normally interactive. Conversant. Not depressed or anxious appearing.  Calm demeanor.  Back; she has tenderness over the thoracic muscles and lumbar muscles on the right.  Normal strength and DTR bilaterally, slight weakness on the LEFT (which is stable per her report).  She does have some pain with SLR on the right.  No saddle anesthesia.   [facility] reading (PRIMARY) by  Dr. Patsy Lager. T spine: negative L spine:  Negative  LUMBAR SPINE - 2-3 VIEW  Comparison: None.  Findings: Normal alignment of the lumbar vertebral bodies. No loss of vertebral body height or disc height. No subluxation. No pars fracture. Prior cholecystectomy.  IMPRESSION: No acute findings the lumbar spine. No significant degenerative change.  THORACIC SPINE - 2 VIEW  Comparison: Lumbar spine same day  Findings: Normal alignment of the thoracic vertebral bodies. No loss of vertebral body height or disc height. No subluxation. Normal paraspinal lines.  IMPRESSION: Normal thoracic spine.   Results for orders placed in visit on 05/30/12  POCT URINALYSIS DIPSTICK      Component Value Range   Color, UA yellow     Clarity, UA clear     Glucose, UA neg     Bilirubin, UA neg     Ketones, UA trace     Spec Grav, UA 1.025     Blood, UA neg     pH, UA 6.5     Protein, UA neg     Urobilinogen, UA 0.2     Nitrite, UA neg     Leukocytes, UA Negative    POCT UA - MICROSCOPIC ONLY      Component Value Range   WBC, Ur, HPF, POC 2-4     RBC, urine, microscopic 1-3     Bacteria, U Microscopic trace     Mucus, UA trace     Epithelial cells, urine per micros 2-4     Crystals, Ur, HPF, POC neg     Casts, Ur, LPF, POC neg     Yeast, UA neg      Assessment and  Plan: 1. Back pain  POCT urinalysis dipstick, POCT UA - Microscopic Only, DG Lumbar Spine 2-3 Views, DG Thoracic Spine 2 View, oxyCODONE-acetaminophen (ROXICET) 5-325 MG per tablet, cyclobenzaprine (  FLEXERIL) 10 MG tablet, predniSONE (DELTASONE) 20 MG tablet   Back pain with possible nerve impingement.  Treat with a short course of prednisone, and percocet/ flexeril as needed. Avoid using both medications at once due to possible excessive sedation.  Her UA does not suggest a stone, but did recommend that she have it rechecked in a month or so to be sure it is completely cleared up.   Meds ordered this encounter  Medications  . oxyCODONE-acetaminophen (ROXICET) 5-325 MG per tablet    Sig: Take 1 tablet by mouth every 8 (eight) hours as needed for pain.    Dispense:  20 tablet    Refill:  0  . cyclobenzaprine (FLEXERIL) 10 MG tablet    Sig: Take 1 tablet (10 mg total) by mouth at bedtime as needed for muscle spasms.    Dispense:  20 tablet    Refill:  0  . predniSONE (DELTASONE) 20 MG tablet    Sig: Take 2 pills for 3 days, then 1 pill for 3 days    Dispense:  9 tablet    Refill:  0  . PATADAY 0.2 % SOLN    Sig:   . NASONEX 50 MCG/ACT nasal spray    Sig:      COPLAND,JESSICA, MD

## 2012-05-30 NOTE — Patient Instructions (Addendum)
Use the oxycodone as needed, and the flexeril at night as needed for pain.    Use the prednisone as well for 6 days- avoid using any NSAIDs while on this medication.  Let me know if you are not better in the next couple of days- Sooner if worse.

## 2012-06-05 ENCOUNTER — Ambulatory Visit (INDEPENDENT_AMBULATORY_CARE_PROVIDER_SITE_OTHER): Payer: 59 | Admitting: Family Medicine

## 2012-06-05 ENCOUNTER — Emergency Department (HOSPITAL_COMMUNITY)
Admission: EM | Admit: 2012-06-05 | Discharge: 2012-06-05 | Disposition: A | Discharge status: Home / Self Care | Payer: 59 | Attending: Emergency Medicine | Admitting: Emergency Medicine

## 2012-06-05 ENCOUNTER — Encounter (HOSPITAL_COMMUNITY): Payer: Self-pay | Admitting: Emergency Medicine

## 2012-06-05 VITALS — BP 127/85 | HR 101 | Temp 97.8°F | Resp 16 | Ht 65.5 in | Wt 194.2 lb

## 2012-06-05 DIAGNOSIS — Z862 Personal history of diseases of the blood and blood-forming organs and certain disorders involving the immune mechanism: Secondary | ICD-10-CM | POA: Insufficient documentation

## 2012-06-05 DIAGNOSIS — R109 Unspecified abdominal pain: Secondary | ICD-10-CM

## 2012-06-05 DIAGNOSIS — Z8719 Personal history of other diseases of the digestive system: Secondary | ICD-10-CM | POA: Insufficient documentation

## 2012-06-05 DIAGNOSIS — J45909 Unspecified asthma, uncomplicated: Secondary | ICD-10-CM | POA: Insufficient documentation

## 2012-06-05 DIAGNOSIS — Z87442 Personal history of urinary calculi: Secondary | ICD-10-CM | POA: Insufficient documentation

## 2012-06-05 DIAGNOSIS — Z9089 Acquired absence of other organs: Secondary | ICD-10-CM | POA: Insufficient documentation

## 2012-06-05 DIAGNOSIS — K859 Acute pancreatitis without necrosis or infection, unspecified: Secondary | ICD-10-CM

## 2012-06-05 DIAGNOSIS — Z79899 Other long term (current) drug therapy: Secondary | ICD-10-CM | POA: Insufficient documentation

## 2012-06-05 DIAGNOSIS — R1013 Epigastric pain: Secondary | ICD-10-CM | POA: Insufficient documentation

## 2012-06-05 DIAGNOSIS — Z8742 Personal history of other diseases of the female genital tract: Secondary | ICD-10-CM | POA: Insufficient documentation

## 2012-06-05 DIAGNOSIS — Z8639 Personal history of other endocrine, nutritional and metabolic disease: Secondary | ICD-10-CM | POA: Insufficient documentation

## 2012-06-05 HISTORY — DX: Acute pancreatitis without necrosis or infection, unspecified: K85.90

## 2012-06-05 LAB — COMPREHENSIVE METABOLIC PANEL
ALT: 12 U/L (ref 0–35)
AST: 20 U/L (ref 0–37)
CO2: 23 mEq/L (ref 19–32)
Chloride: 101 mEq/L (ref 96–112)
GFR calc non Af Amer: 89 mL/min — ABNORMAL LOW (ref >90)
Sodium: 135 mEq/L (ref 135–145)
Total Bilirubin: 0.5 mg/dL (ref 0.3–1.2)

## 2012-06-05 LAB — CBC WITH DIFFERENTIAL/PLATELET
HCT: 40.3 % (ref 36.0–46.0)
Hemoglobin: 14.3 g/dL (ref 12.0–15.0)
Lymphocytes Relative: 37 % (ref 12–46)
MCHC: 35.5 g/dL (ref 30.0–36.0)
Monocytes Absolute: 0.5 10*3/uL (ref 0.1–1.0)
Monocytes Relative: 7 % (ref 3–12)
Neutro Abs: 4.4 10*3/uL (ref 1.7–7.7)
WBC: 8 10*3/uL (ref 4.0–10.5)

## 2012-06-05 LAB — URINALYSIS, MICROSCOPIC ONLY
Glucose, UA: NEGATIVE mg/dL
Hgb urine dipstick: NEGATIVE
Protein, ur: NEGATIVE mg/dL

## 2012-06-05 LAB — POCT I-STAT TROPONIN I: Troponin i, poc: 0 ng/mL (ref 0.00–0.08)

## 2012-06-05 MED ORDER — PROMETHAZINE HCL 25 MG/ML IJ SOLN
25.0000 mg | Freq: Once | INTRAMUSCULAR | Status: AC
  Administered 2012-06-05: 25 mg via INTRAVENOUS
  Filled 2012-06-05: qty 1

## 2012-06-05 MED ORDER — FENTANYL CITRATE 0.05 MG/ML IJ SOLN
50.0000 ug | Freq: Once | INTRAMUSCULAR | Status: AC
  Administered 2012-06-05: 50 ug via INTRAVENOUS
  Filled 2012-06-05: qty 2

## 2012-06-05 MED ORDER — ONDANSETRON 4 MG PO TBDP: 4.0000 mg | ORAL_TABLET | Freq: Three times a day (TID) | ORAL | Status: DC | PRN

## 2012-06-05 MED ORDER — DICYCLOMINE HCL 20 MG PO TABS: 20.0000 mg | ORAL_TABLET | Freq: Two times a day (BID) | ORAL | Status: DC

## 2012-06-05 NOTE — Patient Instructions (Addendum)
Acute Pancreatitis  Acute pancreatitis is a disease in which the pancreas becomes suddenly inflamed. The pancreas is a large gland located behind your stomach. The pancreas produces enzymes that help digest food. The pancreas also releases the hormones glucagon and insulin that help regulate blood sugar. Damage to the pancreas occurs when the digestive enzymes from the pancreas are activated and begin attacking the pancreas before being released into the intestine. Most acute attacks last a couple of days and can cause serious complications. Some people become dehydrated and develop low blood pressure. In severe cases, bleeding into the pancreas can lead to shock and can be life-threatening. The lungs, heart, and kidneys may fail.  CAUSES   Pancreatitis can happen to anyone. In some cases, the cause is unknown. Most cases are caused by:   Alcohol abuse.   Gallstones.  Other less common causes are:   Certain medicines.   Exposure to certain chemicals.   Infection.   Damage caused by an accident (trauma).   Abdominal surgery.  SYMPTOMS    Pain in the upper abdomen that may radiate to the back.   Tenderness and swelling of the abdomen.   Nausea and vomiting.  DIAGNOSIS   Your caregiver will perform a physical exam. Blood and stool tests may be done to confirm the diagnosis. Imaging tests may also be done, such as X-rays, CT scans, or an ultrasound of the abdomen.  TREATMENT   Treatment usually requires a stay in the hospital. Treatment may include:   Pain medicine.   Fluid replacement through an intravenous line (IV).   Placing a tube in the stomach to remove stomach contents and control vomiting.   Not eating for 3 or 4 days. This gives your pancreas a rest, because enzymes are not being produced that can cause further damage.   Antibiotic medicines if your condition is caused by an infection.   Surgery of the pancreas or gallbladder.  HOME CARE INSTRUCTIONS    Follow the diet advised by your  caregiver. This may involve avoiding alcohol and decreasing the amount of fat in your diet.   Eat smaller, more frequent meals. This reduces the amount of digestive juices the pancreas produces.   Drink enough fluids to keep your urine clear or pale yellow.   Only take over-the-counter or prescription medicines as directed by your caregiver.   Avoid drinking alcohol if it caused your condition.   Do not smoke.   Get plenty of rest.   Check your blood sugar at home as directed by your caregiver.   Keep all follow-up appointments as directed by your caregiver.  SEEK MEDICAL CARE IF:    You do not recover as quickly as expected.   You develop new or worsening symptoms.   You have persistent pain, weakness, or nausea.   You recover and then have another episode of pain.  SEEK IMMEDIATE MEDICAL CARE IF:    You are unable to eat or keep fluids down.   Your pain becomes severe.   You have a fever or persistent symptoms for more than 2 to 3 days.   You have a fever and your symptoms suddenly get worse.   Your skin or the white part of your eyes turn yellow (jaundice).   You develop vomiting.   You feel dizzy, or you faint.   Your blood sugar is high (over 300 mg/dL).  MAKE SURE YOU:    Understand these instructions.   Will watch your condition.   

## 2012-06-05 NOTE — Progress Notes (Signed)
Subjective:    Patient ID: Kendra Gonzalez, female    DOB: 1970-09-08, 42 y.o.   MRN: [id number] Chief Complaint  Patient presents with  . Abdominal Pain    last pm started  . Nausea   Chief Complaint  Patient presents with  . Abdominal Pain    last pm started  . Nausea    HPI  Epigastric pain started mid-morning, did try some hydrocodone which didn't help.  She has been out of her protonix since September - it didn't help.  This is similar to the pain she had in September - after which she was direct admitted to the hosp and hospitalized for over a wk for pancreatitis.  It was thought to be due to ERCP which did not show a retain gallstone. She has been nauseas today but no vomiting or diarrhea.  Has not eaten anything all day - has only drank water. No alcohol.  Does not remember every having cholesterol checked.  Past Medical History  Diagnosis Date  . Endometriosis   . Hyperlipidemia   . Kidney stones   . Asthma   . Allergy    Current Outpatient Prescriptions on File Prior to Visit  Medication Sig Dispense Refill  . albuterol (PROVENTIL HFA;VENTOLIN HFA) 108 (90 BASE) MCG/ACT inhaler Inhale 2 puffs into the lungs every 6 (six) hours as needed. For shortness of breath or coughing      . AMBULATORY NON FORMULARY MEDICATION Boric Acid Suppository 600 mg Insert 1 PV twice weekly to maintain vaginal flora  15 suppository  0  . cyclobenzaprine (FLEXERIL) 10 MG tablet Take 1 tablet (10 mg total) by mouth at bedtime as needed for muscle spasms.  20 tablet  0  . Fluticasone-Salmeterol (ADVAIR) 250-50 MCG/DOSE AEPB Inhale 2 puffs into the lungs every 12 (twelve) hours.      Marland Kitchen levocetirizine (XYZAL) 5 MG tablet Take 5 mg by mouth every evening.      . magnesium gluconate (MAGONATE) 500 MG tablet Take 500 mg by mouth at bedtime.      Marland Kitchen NASONEX 50 MCG/ACT nasal spray       . oxyCODONE-acetaminophen (ROXICET) 5-325 MG per tablet Take 1 tablet by mouth every 8 (eight) hours as needed  for pain.  20 tablet  0  . PRESCRIPTION MEDICATION once a week. Allergy shots at doctor's office once weekly      . traMADol (ULTRAM) 50 MG tablet Take 1 tablet (50 mg total) by mouth every 8 (eight) hours as needed for pain.  30 tablet  0  . pantoprazole (PROTONIX) 40 MG tablet Take 1 tablet (40 mg total) by mouth daily at 12 noon.  30 tablet  0  . PATADAY 0.2 % SOLN       . predniSONE (DELTASONE) 20 MG tablet Take 2 pills for 3 days, then 1 pill for 3 days  9 tablet  0   No current facility-administered medications on file prior to visit.   No Known Allergies   Review of Systems  Constitutional: Positive for activity change, appetite change and fatigue. Negative for fever, chills and unexpected weight change.  Respiratory: Negative for shortness of breath.   Cardiovascular: Negative for chest pain and leg swelling.  Gastrointestinal: Positive for nausea and abdominal pain. Negative for vomiting, diarrhea, constipation, blood in stool, abdominal distention and anal bleeding.  Genitourinary: Negative for dysuria, decreased urine volume and difficulty urinating.  Musculoskeletal: Negative for gait problem.  Skin: Negative for rash.  Hematological: Negative  for adenopathy.      BP 127/85  Pulse 101  Temp(Src) 97.8 F (36.6 C) (Oral)  Resp 16  Ht 5' 5.5" (1.664 m)  Wt 194 lb 3.2 oz (88.089 kg)  BMI 31.81 kg/m2  SpO2 97%  LMP 06/06/1996 Objective:   Physical Exam  Constitutional: She is oriented to person, place, and time. She appears well-developed and well-nourished. She appears ill. No distress.  Pt on the bed clutching her stomach - rolling back and forth constantly.  HENT:  Head: Normocephalic and atraumatic.  Neck: Normal range of motion. Neck supple. No thyromegaly present.  Cardiovascular: Normal rate, regular rhythm, normal heart sounds and intact distal pulses.   Pulmonary/Chest: Effort normal and breath sounds normal. No respiratory distress.  Abdominal: Soft. She  exhibits no mass. There is tenderness in the epigastric area. There is no rigidity, no rebound, no guarding, no CVA tenderness, no tenderness at McBurney's point and negative Murphy's sign.  Musculoskeletal: She exhibits no edema.  Lymphadenopathy:    She has no cervical adenopathy.  Neurological: She is alert and oriented to person, place, and time.  Skin: Skin is warm and dry. She is not diaphoretic. No erythema.  Psychiatric: She has a normal mood and affect. Her behavior is normal.      Assessment & Plan:  1. Acute pancreatitis - exact same sxs as initial episode 4 mos prev and severe epigastric pain on exam. Husband to take pt to ER immed for further eval. Charge nurse at [facility] noticed.  Unsure of cause - pt on many meds and lipids unknown but needs acute eval tonight.

## 2012-06-05 NOTE — ED Notes (Signed)
Patient complaining of intermittent abdominal pain that started yesterday.  Patient reports nausea; denies vomiting and diarrhea. Patient was seen at [facility] today; was referred here for possible pancreatitis.  Patient reports history of pancreatitis since September of last year.  Patient alert and oriented x4; PERRL present.  Upon arrival to room, patient changed into gown.  Will continue to monitor.

## 2012-06-05 NOTE — ED Provider Notes (Signed)
History     CSN: [id number]  Arrival date & time 06/05/12  [id number]   First MD Initiated Contact with Patient 06/05/12 1958      Chief Complaint  Patient presents with  . Pancreatitis    (Consider location/radiation/quality/duration/timing/severity/associated sxs/prior treatment) HPI Comments: Pt presents today for abdominal pain x 2 days.  Symptoms woke patient from sleep at approximately 2am on 06/04/12.  Pain described as sharp, constant, non-radiating and epigastric in location.  Pain 10/10.  Prior pancreatitis in September 2013.  No recent changes in diet or medications.  Has tried OTC pain meds without relief.  Prior cholecystectomy.  Denies any recent fever, nausea, vomiting, diarrhea, dysuria, chest pain, or SOB.  The history is provided by the patient.    Past Medical History  Diagnosis Date  . Endometriosis   . Hyperlipidemia   . Kidney stones   . Asthma   . Allergy   . Pancreatitis     Past Surgical History  Procedure Laterality Date  . Cholecystectomy    . Abdominal hysterectomy    . Oophrectomy  2011    secondary to cysts  . Carpel tunnel    . Arthroscopic knee surgery     . Hip surgery    . Ercp  01/08/2012    Procedure: ENDOSCOPIC RETROGRADE CHOLANGIOPANCREATOGRAPHY (ERCP);  Surgeon: Theda Belfast, MD;  Location[facility] ENDOSCOPY;  Service: Endoscopy;  Laterality: N/A;    Family History  Problem Relation Age of Onset  . Hypertension Other   . Heart disease Daughter     History  Substance Use Topics  . Smoking status: Never Smoker   . Smokeless tobacco: Never Used  . Alcohol Use: No    OB History   Grav Para Term Preterm Abortions TAB SAB Ect Mult Living                  Review of Systems  Gastrointestinal: Positive for abdominal pain.  All other systems reviewed and are negative.    Allergies  Review of patient's allergies indicates no known allergies.  Home Medications   Current Outpatient Rx  Name  Route  Sig  Dispense  Refill  .  albuterol (PROVENTIL HFA;VENTOLIN HFA) 108 (90 BASE) MCG/ACT inhaler   Inhalation   Inhale 2 puffs into the lungs every 6 (six) hours as needed. For shortness of breath or coughing         . AMBULATORY NON FORMULARY MEDICATION      Boric Acid Suppository 600 mg Insert 1 PV twice weekly to maintain vaginal flora   15 suppository   0   . cyclobenzaprine (FLEXERIL) 10 MG tablet   Oral   Take 1 tablet (10 mg total) by mouth at bedtime as needed for muscle spasms.   20 tablet   0   . Fluticasone-Salmeterol (ADVAIR) 250-50 MCG/DOSE AEPB   Inhalation   Inhale 2 puffs into the lungs every 12 (twelve) hours.         Marland Kitchen levocetirizine (XYZAL) 5 MG tablet   Oral   Take 5 mg by mouth every evening.         . magnesium gluconate (MAGONATE) 500 MG tablet   Oral   Take 500 mg by mouth at bedtime.         Marland Kitchen NASONEX 50 MCG/ACT nasal spray   Nasal   Place 2 sprays into the nose daily.          Marland Kitchen oxyCODONE-acetaminophen (ROXICET) 5-325 MG per tablet  Oral   Take 1 tablet by mouth every 8 (eight) hours as needed for pain.   20 tablet   0   . PATADAY 0.2 % SOLN   Both Eyes   Place 1 drop into both eyes daily as needed. For allergy         . traMADol (ULTRAM) 50 MG tablet   Oral   Take 1 tablet (50 mg total) by mouth every 8 (eight) hours as needed for pain.   30 tablet   0     BP 131/91  Pulse 112  Temp(Src) 98.4 F (36.9 C) (Oral)  Resp 20  SpO2 100%  LMP 06/06/1996  Physical Exam  Nursing note and vitals reviewed. Constitutional: She is oriented to person, place, and time. She appears well-developed and well-nourished.  Pt looks uncomfortable but not distressed.  HENT:  Head: Normocephalic and atraumatic.  Mouth/Throat: Oropharynx is clear and moist.  Eyes: Conjunctivae and EOM are normal. Pupils are equal, round, and reactive to light. No scleral icterus.  Neck: Normal range of motion. Neck supple.  Cardiovascular: Regular rhythm and normal heart sounds.   Tachycardia present.   Pulmonary/Chest: Breath sounds normal. No respiratory distress. She has no wheezes.  Abdominal: Soft. Bowel sounds are decreased. There is tenderness in the epigastric area. There is guarding. There is no CVA tenderness, no tenderness at McBurney's point and negative Murphy's sign (cholecystectomy).  Neurological: She is alert and oriented to person, place, and time.  Skin: Skin is warm and dry.  Psychiatric: She has a normal mood and affect.    ED Course  Procedures (including critical care time)   Date: 06/05/2012  Rate: 102  Rhythm: sinus tachycardia  QRS Axis: normal  Intervals: normal  ST/T Wave abnormalities: normal  Conduction Disutrbances:none  Narrative Interpretation: sinus tachycardia  Old EKG Reviewed: unchanged    Labs Reviewed  COMPREHENSIVE METABOLIC PANEL - Abnormal; Notable for the following:    GFR calc non Af Amer 89 (*)    All other components within normal limits  URINALYSIS, MICROSCOPIC ONLY - Abnormal; Notable for the following:    Ketones, ur 40 (*)    Leukocytes, UA TRACE (*)    Squamous Epithelial / LPF FEW (*)    All other components within normal limits  CBC WITH DIFFERENTIAL  LIPASE, BLOOD  POCT I-STAT TROPONIN I   No results found.   1. Abdominal pain       MDM  8:35 PM Pt evaluated.  Non-radiating epigastric pain noted.  Pt had pancreatitis in 12/2011 and states this feels similar.  Labs largely unremarkable at this time.  Troponin and EKG pending.   9:19 PM EKG sinus tach.  Trop 0. Pain has improved to 5/10.  Now has some nausea.   10:32 PM Pain and VS stable.  Pt ok for discharge.  Pain and nausea meds given.  Encouraged to follow-up with her gastroenterologist.  Return to ED for new or worsening symptoms.    Garlon Hatchet, PA-C 06/05/12 2304

## 2012-06-05 NOTE — ED Notes (Signed)
Patient currently asleep in bed; no respiratory or acute distress noted.  Patient updated on plan of care; informed patient that we are currently waiting on lab results.  Patient requesting ice chips; EDP notified.  Patient denies any other needs at this time.  Will continue to monitor.

## 2012-06-05 NOTE — ED Notes (Addendum)
[facility].  Pt reports RUQ pain since 2am with nausea.

## 2012-06-05 NOTE — ED Notes (Signed)
Patient currently resting quietly in bed; no respiratory or acute distress noted.  Patient updated on plan of care; informed patient that we are currently waiting on further orders from EDP.  Denies any needs at this time; will continue to monitor.

## 2012-06-05 NOTE — ED Notes (Signed)
Patient given copy of discharge paperwork; went over discharge instructions with patient.  Patient instructed to take prescriptions as directed, to follow up with gastroenterologist first thing Monday morning, and to return to the ED for new, worsening, or concerning symptoms.

## 2012-06-06 NOTE — ED Provider Notes (Signed)
Medical screening examination/treatment/procedure(s) were performed by non-physician practitioner and as supervising physician I was immediately available for consultation/collaboration.   Gwyneth Sprout, MD 06/06/12 [phone]

## 2012-06-23 ENCOUNTER — Ambulatory Visit (INDEPENDENT_AMBULATORY_CARE_PROVIDER_SITE_OTHER): Payer: 59 | Admitting: Internal Medicine

## 2012-06-23 VITALS — BP 124/80 | HR 88 | Temp 98.0°F | Resp 16 | Ht 64.5 in | Wt 194.4 lb

## 2012-06-23 DIAGNOSIS — R1011 Right upper quadrant pain: Secondary | ICD-10-CM

## 2012-06-23 DIAGNOSIS — G8929 Other chronic pain: Secondary | ICD-10-CM | POA: Insufficient documentation

## 2012-06-23 MED ORDER — CILIDINIUM-CHLORDIAZEPOXIDE 2.5-5 MG PO CAPS: 1.0000 | ORAL_CAPSULE | Freq: Three times a day (TID) | ORAL | Status: DC | PRN

## 2012-06-23 MED ORDER — ONDANSETRON HCL 8 MG PO TABS: 8.0000 mg | ORAL_TABLET | Freq: Three times a day (TID) | ORAL | Status: DC | PRN

## 2012-06-23 NOTE — Progress Notes (Signed)
  Subjective:    Patient ID: Kendra Gonzalez, female    DOB: Mar 11, 1971, 42 y.o.   MRN: [id number]  HPI Has chronic abdominal pain with full w/up by 2 different GI groups. Goes to [facility] now. No diarrhea and bms normal, no blood. No fever and no urinary sxs.   Review of Systems     Objective:   Physical Exam  Vitals reviewed. Constitutional: She is oriented to person, place, and time. She appears well-nourished.  Eyes: EOM are normal. No scleral icterus.  Cardiovascular: Normal rate, regular rhythm and normal heart sounds.   Pulmonary/Chest: Effort normal and breath sounds normal.  Abdominal: Soft. Bowel sounds are normal. She exhibits no distension and no mass. There is no rebound and no guarding.  Neurological: She is alert and oriented to person, place, and time. Coordination normal.  Skin: Skin is warm and dry.  Psychiatric: She has a normal mood and affect.   Appears well BS normal       Assessment & Plan:  Chronic abdominal pain Nausea Zofran/librax trial

## 2012-06-23 NOTE — Patient Instructions (Addendum)
Abdominal Pain (Nonspecific)  Your exam might not show the exact reason you have abdominal pain. Since there are many different causes of abdominal pain, another checkup and more tests may be needed. It is very important to follow up for lasting (persistent) or worsening symptoms. A possible cause of abdominal pain in any person who still has his or her appendix is acute appendicitis. Appendicitis is often hard to diagnose. Normal blood tests, urine tests, ultrasound, and CT scans do not completely rule out early appendicitis or other causes of abdominal pain. Sometimes, only the changes that happen over time will allow appendicitis and other causes of abdominal pain to be determined. Other potential problems that may require surgery may also take time to become more apparent. Because of this, it is important that you follow all of the instructions below.  HOME CARE INSTRUCTIONS    Rest as much as possible.   Do not eat solid food until your pain is gone.   While adults or children have pain: A diet of water, weak decaffeinated tea, broth or bouillon, gelatin, oral rehydration solutions (ORS), frozen ice pops, or ice chips may be helpful.   When pain is gone in adults or children: Start a light diet (dry toast, crackers, applesauce, or white rice). Increase the diet slowly as long as it does not bother you. Eat no dairy products (including cheese and eggs) and no spicy, fatty, fried, or high-fiber foods.   Use no alcohol, caffeine, or cigarettes.   Take your regular medicines unless your caregiver told you not to.   Take any prescribed medicine as directed.   Only take over-the-counter or prescription medicines for pain, discomfort, or fever as directed by your caregiver. Do not give aspirin to children.  If your caregiver has given you a follow-up appointment, it is very important to keep that appointment. Not keeping the appointment could result in a permanent injury and/or lasting (chronic) pain and/or  disability. If there is any problem keeping the appointment, you must call to reschedule.   SEEK IMMEDIATE MEDICAL CARE IF:    Your pain is not gone in 24 hours.   Your pain becomes worse, changes location, or feels different.   You or your child has an oral temperature above 102 F (38.9 C), not controlled by medicine.   Your baby is older than 3 months with a rectal temperature of 102 F (38.9 C) or higher.   Your baby is 3 months old or younger with a rectal temperature of 100.4 F (38 C) or higher.   You have shaking chills.   You keep throwing up (vomiting) or cannot drink liquids.   There is blood in your vomit or you see blood in your bowel movements.   Your bowel movements become dark or black.   You have frequent bowel movements.   Your bowel movements stop (become blocked) or you cannot pass gas.   You have bloody, frequent, or painful urination.   You have yellow discoloration in the skin or whites of the eyes.   Your stomach becomes bloated or bigger.   You have dizziness or fainting.   You have chest or back pain.  MAKE SURE YOU:    Understand these instructions.   Will watch your condition.   Will get help right away if you are not doing well or get worse.  Document Released: 04/13/2005 Document Revised: 07/06/2011 Document Reviewed: 03/11/2009  [employer] Patient Information 2013 [employer].

## 2012-07-09 ENCOUNTER — Ambulatory Visit (INDEPENDENT_AMBULATORY_CARE_PROVIDER_SITE_OTHER): Payer: 59 | Admitting: Emergency Medicine

## 2012-07-09 ENCOUNTER — Ambulatory Visit: Payer: 59

## 2012-07-09 VITALS — BP 110/74 | HR 94 | Temp 98.4°F | Resp 16 | Ht 64.5 in | Wt 193.2 lb

## 2012-07-09 DIAGNOSIS — M25532 Pain in left wrist: Secondary | ICD-10-CM

## 2012-07-09 DIAGNOSIS — M25539 Pain in unspecified wrist: Secondary | ICD-10-CM

## 2012-07-09 DIAGNOSIS — N760 Acute vaginitis: Secondary | ICD-10-CM

## 2012-07-09 LAB — POCT WET PREP WITH KOH
KOH Prep POC: NEGATIVE
Trichomonas, UA: NEGATIVE
Yeast Wet Prep HPF POC: NEGATIVE

## 2012-07-09 MED ORDER — FLUCONAZOLE 150 MG PO TABS: 150.0000 mg | ORAL_TABLET | Freq: Once | ORAL | Status: DC

## 2012-07-09 NOTE — Progress Notes (Signed)
Lt forearm thumb spica fit and trained along with 3" ace rap to the Lt arm. Eileen Stanford

## 2012-07-09 NOTE — Progress Notes (Signed)
  Subjective:    Patient ID: Kendra Gonzalez, female    DOB: 1970/11/24, 41 y.o.   MRN: [id number]  HPI #1 his pain and swelling in the left wrist. Patient has pain with movement of the wrist. 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 She denies any swelling or redness of the wrist. Problem #2 is a vaginal discharge. 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 She denies any new sexual partners.    Review of Systems     Objective:   Physical Exam there is mild tenderness over the radial styloid. There is pain with full flexion and full extension. Finkelstein test was negative.  [facility] reading (PRIMARY) by  Dr.Daub no fractures  Results for orders placed in visit on 07/09/12  POCT WET PREP WITH KOH      Result Value Range   Trichomonas, UA Negative     Clue Cells Wet Prep HPF POC neg     Epithelial Wet Prep HPF POC 4-8     Yeast Wet Prep HPF POC neg     Bacteria Wet Prep HPF POC neg     RBC Wet Prep HPF POC neg     WBC Wet Prep HPF POC 0-2     KOH Prep POC Negative          Assessment & Plan:  The self wet prep to be done. X-rays look normal. She's placed in a thumb spica splint. She will take Advil for pain. She was given a prescription for Diflucan to take if her discharge persists

## 2012-08-05 ENCOUNTER — Other Ambulatory Visit: Payer: Self-pay | Admitting: Family Medicine

## 2012-08-05 DIAGNOSIS — N63 Unspecified lump in unspecified breast: Secondary | ICD-10-CM

## 2012-09-08 ENCOUNTER — Ambulatory Visit
Admission: RE | Admit: 2012-09-08 | Discharge: 2012-09-08 | Disposition: A | Discharge status: Home / Self Care | Payer: 59 | Source: Ambulatory Visit | Attending: Family Medicine | Admitting: Family Medicine

## 2012-09-08 DIAGNOSIS — N63 Unspecified lump in unspecified breast: Secondary | ICD-10-CM

## 2012-09-20 ENCOUNTER — Telehealth: Payer: Self-pay

## 2012-09-20 ENCOUNTER — Other Ambulatory Visit: Payer: Self-pay | Admitting: Radiology

## 2012-09-20 ENCOUNTER — Other Ambulatory Visit: Payer: Self-pay | Admitting: Family Medicine

## 2012-09-20 DIAGNOSIS — N649 Disorder of breast, unspecified: Secondary | ICD-10-CM

## 2012-09-20 DIAGNOSIS — Z87898 Personal history of other specified conditions: Secondary | ICD-10-CM

## 2012-09-20 NOTE — Telephone Encounter (Signed)
PT STATES SHE HAD A MAMMOGRAM AND ULTRA SOUND DONE ON HER LEFT BREAST AND WOULD LIKE TO DISCUSS THE RESULTS WITH SOMEONE. PLEASE CALL [phone]

## 2012-09-20 NOTE — Telephone Encounter (Signed)
Ultrasound is performed, showing oval hypoechoic lesion at the left  breast seven o'clock 4 cm from nipple measuring 0.55 x 0.29 x 0.46  cm. This is smaller compared to the previous ultrasound. This is  probably a cyst.  IMPRESSION:  Probable benign findings.  RECOMMENDATION:  19-month follow-up ultrasound left breast.  I have discussed the findings and recommendations with the patient.  Results were also provided in writing at the conclusion of the  visit. If applicable, a reminder letter will be sent to the  patient regarding the next appointment.  BI-RADS CATEGORY 3: Probably benign finding(s) - short interval  follow-up suggested.  Called her, left message for her to call me back.

## 2012-09-20 NOTE — Telephone Encounter (Signed)
I have advised patient of her [place] / Mammogram results and have put in the order for her to have the repeat US in 6months. To you FYI

## 2012-09-30 ENCOUNTER — Encounter (INDEPENDENT_AMBULATORY_CARE_PROVIDER_SITE_OTHER): Payer: Self-pay | Admitting: Surgery

## 2012-09-30 ENCOUNTER — Ambulatory Visit (INDEPENDENT_AMBULATORY_CARE_PROVIDER_SITE_OTHER): Payer: 59 | Admitting: Surgery

## 2012-09-30 VITALS — BP 120/80 | HR 78 | Temp 98.2°F | Resp 18 | Ht 64.0 in | Wt 192.0 lb

## 2012-09-30 DIAGNOSIS — N6019 Diffuse cystic mastopathy of unspecified breast: Secondary | ICD-10-CM

## 2012-09-30 NOTE — Patient Instructions (Signed)
Continue to have annual mammograms and annual followups with your primary care physician and gynecologist. You do have dense breast tissue which makes examination and mammograms a [place] bit more difficult to evaluate.

## 2012-09-30 NOTE — Progress Notes (Signed)
Patient ID: Kendra Gonzalez, female   DOB: 1970/12/25, 42 y.o.   MRN: [id number]  Chief Complaint  Patient presents with  . Breast Problem    2nd opinion of breast cysts/ dense tissue    HPI Kendra Gonzalez is a 42 y.o. female.  She has been told on mammogram that she has dense breasts and a cyst is also been noted. She requested surgical evaluation to be sure nothing else is being overlooked. She has no history of breast problems in the past. Her family history is negative for breast and ovarian cancer. She has had children but has not breast fed. She had a mammogram 6 months ago with a followup more recently.  HPI  Past Medical History  Diagnosis Date  . Endometriosis   . Hyperlipidemia   . Kidney stones   . Asthma   . Allergy   . Pancreatitis     Past Surgical History  Procedure Laterality Date  . Cholecystectomy    . Abdominal hysterectomy    . Oophrectomy  2011    secondary to cysts  . Carpel tunnel    . Arthroscopic knee surgery     . Hip surgery    . Ercp  01/08/2012    Procedure: ENDOSCOPIC RETROGRADE CHOLANGIOPANCREATOGRAPHY (ERCP);  Surgeon: Theda Belfast, MD;  Location: [facility] ENDOSCOPY;  Service: Endoscopy;  Laterality: N/A;  . Tubal ligation      Family History  Problem Relation Age of Onset  . Hypertension Other   . Heart disease Daughter     Social History History  Substance Use Topics  . Smoking status: Never Smoker   . Smokeless tobacco: Never Used  . Alcohol Use: No    No Known Allergies  Current Outpatient Prescriptions  Medication Sig Dispense Refill  . albuterol (PROVENTIL HFA;VENTOLIN HFA) 108 (90 BASE) MCG/ACT inhaler Inhale 2 puffs into the lungs every 6 (six) hours as needed. For shortness of breath or coughing      . Cyanocobalamin (B-12 PO) Take by mouth.      . cyclobenzaprine (FLEXERIL) 10 MG tablet Take 1 tablet (10 mg total) by mouth at bedtime as needed for muscle spasms.  20 tablet  0  . Fluticasone-Salmeterol (ADVAIR) 250-50  MCG/DOSE AEPB Inhale 2 puffs into the lungs every 12 (twelve) hours.      Marland Kitchen FOLIC ACID PO Take by mouth.      . levocetirizine (XYZAL) 5 MG tablet Take 5 mg by mouth every evening.      . magnesium gluconate (MAGONATE) 500 MG tablet Take 500 mg by mouth at bedtime.      . traMADol (ULTRAM) 50 MG tablet Take 1 tablet (50 mg total) by mouth every 8 (eight) hours as needed for pain.  30 tablet  0   No current facility-administered medications for this visit.    Review of Systems Review of Systems  Constitutional: Negative for fever, chills and unexpected weight change.  HENT: Negative for hearing loss, congestion, sore throat, trouble swallowing and voice change.   Eyes: Negative for visual disturbance.  Respiratory: Negative for cough and wheezing.   Cardiovascular: Negative for chest pain, palpitations and leg swelling.  Gastrointestinal: Negative for nausea, vomiting, abdominal pain, diarrhea, constipation, blood in stool, abdominal distention and anal bleeding.  Genitourinary: Negative for hematuria, vaginal bleeding and difficulty urinating.  Musculoskeletal: Negative for arthralgias.  Skin: Negative for rash and wound.  Neurological: Negative for seizures, syncope and headaches.  Hematological: Negative for adenopathy. Does not  bruise/bleed easily.  Psychiatric/Behavioral: Negative for confusion.    Blood pressure 120/80, pulse 78, temperature 98.2 F (36.8 C), temperature source Oral, resp. rate 18, height 5\' 4"  (1.626 m), weight 192 lb (87.091 kg), last menstrual period 06/06/1996.  Physical Exam Physical Exam  Constitutional: She appears well-developed and well-nourished.  HENT:  Head: Normocephalic.  Eyes: Pupils are equal, round, and reactive to light.  Pulmonary/Chest: Effort normal. Right breast exhibits no inverted nipple, no mass, no nipple discharge, no skin change and no tenderness. Left breast exhibits no inverted nipple, no mass, no nipple discharge, no skin change  and no tenderness. Breasts are symmetrical.  Her breasts are somewhat dense bilaterally but symmetric. There is some mild fibrocystic irregularity in the upper outer quadrants bilaterally.  Lymphadenopathy:    She has no axillary adenopathy.       Right: No supraclavicular adenopathy present.       Left: No supraclavicular adenopathy present.    Data Reviewed I have reviewed her mammograms and ultrasound reports, as well as the notes in the electronic medical record  Assessment    Bilateral dense breast tissue with mild fibrocystic changes     Plan    I reviewed with her the fact that his breasts make mammography somewhat less accurate and the need for annual mammograms and physical examinations. I didn't think there is other indications for diagnostic tests at this point in time.        Halden Phegley J 09/30/2012, 10:15 AM

## 2012-10-19 ENCOUNTER — Ambulatory Visit (INDEPENDENT_AMBULATORY_CARE_PROVIDER_SITE_OTHER): Payer: 59 | Admitting: Family Medicine

## 2012-10-19 VITALS — BP 120/86 | HR 78 | Temp 98.0°F | Resp 16 | Ht 64.0 in | Wt 191.0 lb

## 2012-10-19 DIAGNOSIS — R109 Unspecified abdominal pain: Secondary | ICD-10-CM

## 2012-10-19 DIAGNOSIS — R1030 Lower abdominal pain, unspecified: Secondary | ICD-10-CM

## 2012-10-19 DIAGNOSIS — B001 Herpesviral vesicular dermatitis: Secondary | ICD-10-CM

## 2012-10-19 DIAGNOSIS — B009 Herpesviral infection, unspecified: Secondary | ICD-10-CM

## 2012-10-19 LAB — POCT CBC
Granulocyte percent: 58.8 %G (ref 37–80)
Lymph, poc: 2.4 (ref 0.6–3.4)
MCH, POC: 30.2 pg (ref 27–31.2)
MCHC: 31.8 g/dL (ref 31.8–35.4)
MCV: 94.8 fL (ref 80–97)
MID (cbc): 0.5 (ref 0–0.9)
POC LYMPH PERCENT: 33.7 %L (ref 10–50)
Platelet Count, POC: 293 10*3/uL (ref 142–424)
RDW, POC: 13.3 %
WBC: 7.2 10*3/uL (ref 4.6–10.2)

## 2012-10-19 MED ORDER — NAPROXEN 500 MG PO TABS: 500.0000 mg | ORAL_TABLET | Freq: Two times a day (BID) | ORAL | Status: DC

## 2012-10-19 MED ORDER — VALACYCLOVIR HCL 1 G PO TABS: ORAL_TABLET | ORAL | Status: DC

## 2012-10-19 NOTE — Patient Instructions (Signed)
Take naproxen 500 mg one twice daily for inflammation and pain  If not improving over the next couple of weeks, or if worse at anytime, may need to reassess.

## 2012-10-19 NOTE — Progress Notes (Signed)
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 She has been having problems with pain in her left groin over the past month. Knows of no injury or inciting event. She has had surgery ureter ago on her left trochanteric bursa, and wondered whether that could be related. She hurts in she's bending but it also can hurt when she is sitting or laying down. Last night it was hurting laying down.  She also has a large fever blister coming on, and wants something for that.  Objective: Fever blister upper lip Abdomen soft with normal bowel sounds. No lymphadenopathy can become. Groin is very tender just along the groin crease. No hernia could be felt in the lying or standing. Leg appears normal. Has had a hysterectomy.  Assessment: Left groin pain HSV 1  Plan: Valtrex Check a CBC. Assuming it is normal will treat with anti-inflammatory medications and continue to give it some time.  Results for orders placed in visit on 10/19/12  POCT CBC      Result Value Range   WBC 7.2  4.6 - 10.2 K/uL   Lymph, poc 2.4  0.6 - 3.4   POC LYMPH PERCENT 33.7  10 - 50 %L   MID (cbc) 0.5  0 - 0.9   POC MID % 7.5  0 - 12 %M   POC Granulocyte 4.2  2 - 6.9   Granulocyte percent 58.8  37 - 80 %G   RBC 4.61  4.04 - 5.48 M/uL   Hemoglobin 13.9  12.2 - 16.2 g/dL   HCT, POC 40.9  81.1 - 47.9 %   MCV 94.8  80 - 97 fL   MCH, POC 30.2  27 - 31.2 pg   MCHC 31.8  31.8 - 35.4 g/dL   RDW, POC 91.4     Platelet Count, POC 293  142 - 424 K/uL   MPV 8.8  0 - 99.8 fL

## 2012-12-12 ENCOUNTER — Ambulatory Visit (INDEPENDENT_AMBULATORY_CARE_PROVIDER_SITE_OTHER): Payer: 59 | Admitting: Emergency Medicine

## 2012-12-12 VITALS — BP 112/70 | HR 75 | Temp 98.2°F | Resp 18 | Ht 65.5 in | Wt 192.0 lb

## 2012-12-12 DIAGNOSIS — J209 Acute bronchitis, unspecified: Secondary | ICD-10-CM

## 2012-12-12 MED ORDER — AZITHROMYCIN 250 MG PO TABS: ORAL_TABLET | ORAL | Status: DC

## 2012-12-12 MED ORDER — HYDROCOD POLST-CHLORPHEN POLST 10-8 MG/5ML PO LQCR: 5.0000 mL | Freq: Two times a day (BID) | ORAL | Status: DC | PRN

## 2012-12-12 NOTE — Progress Notes (Signed)
[facility] [address] [place] [address] [phone]  Date:  12/12/2012   Name:  Kendra Gonzalez   DOB:  1970/06/25   MRN:  [id number]  PCP:  Virgilio Belling    Chief Complaint: cough, sore throat, earache, body aches   History of Present Illness:  Kendra Gonzalez is a 42 y.o. very pleasant female patient who presents with the following:  Ill since Thursday with sore throat, non productive cough and pain in left.  No wheezing or shortness breath.  No coryza.  No nausea or vomiting.  No fever or chills, rash or stool change.   No post nasal drip.  No improvement with over the counter medications or other home remedies. Denies other complaint or health concern today.   Patient Active Problem List   Diagnosis Date Noted  . Abdominal pain, chronic, right upper quadrant 06/23/2012  . Obesity (BMI 30.0-34.9) 10/12/2011  . CANDIDIASIS, VULVA/VAGINA 02/16/2007  . HYPERLIPIDEMIA 02/16/2007  . ALLERGIC RHINITIS 02/16/2007  . GERD 02/16/2007  . GLUCOSE INTOLERANCE, HX OF 02/16/2007    Past Medical History  Diagnosis Date  . Endometriosis   . Hyperlipidemia   . Kidney stones   . Asthma   . Allergy   . Pancreatitis     Past Surgical History  Procedure Laterality Date  . Cholecystectomy    . Abdominal hysterectomy    . Oophrectomy  2011    secondary to cysts  . Carpel tunnel    . Arthroscopic knee surgery     . Hip surgery    . Ercp  01/08/2012    Procedure: ENDOSCOPIC RETROGRADE CHOLANGIOPANCREATOGRAPHY (ERCP);  Surgeon: Theda Belfast, MD;  Location: [facility] ENDOSCOPY;  Service: Endoscopy;  Laterality: N/A;  . Tubal ligation      History  Substance Use Topics  . Smoking status: Never Smoker   . Smokeless tobacco: Never Used  . Alcohol Use: No    Family History  Problem Relation Age of Onset  . Hypertension Other   . Heart disease Daughter     No Known Allergies  Medication list has been reviewed and updated.  Current Outpatient  Prescriptions on File Prior to Visit  Medication Sig Dispense Refill  . albuterol (PROVENTIL HFA;VENTOLIN HFA) 108 (90 BASE) MCG/ACT inhaler Inhale 2 puffs into the lungs every 6 (six) hours as needed. For shortness of breath or coughing      . Cyanocobalamin (B-12 PO) Take by mouth.      . Fluticasone-Salmeterol (ADVAIR) 250-50 MCG/DOSE AEPB Inhale 2 puffs into the lungs every 12 (twelve) hours.      Marland Kitchen FOLIC ACID PO Take by mouth.      . levocetirizine (XYZAL) 5 MG tablet Take 5 mg by mouth every evening.      . magnesium gluconate (MAGONATE) 500 MG tablet Take 500 mg by mouth at bedtime.      . traMADol (ULTRAM) 50 MG tablet Take 1 tablet (50 mg total) by mouth every 8 (eight) hours as needed for pain.  30 tablet  0  . cyclobenzaprine (FLEXERIL) 10 MG tablet Take 1 tablet (10 mg total) by mouth at bedtime as needed for muscle spasms.  20 tablet  0  . naproxen (NAPROSYN) 500 MG tablet Take 1 tablet (500 mg total) by mouth 2 (two) times daily with a meal.  30 tablet  1  . valACYclovir (VALTREX) 1000 MG tablet Take 2 pills each dose 2 doses 12 hours apart for  fever blisters  12 tablet  0   No current facility-administered medications on file prior to visit.    Review of Systems:  As per HPI, otherwise negative.    Physical Examination: Filed Vitals:   12/12/12 1032  BP: 112/70  Pulse: 75  Temp: 98.2 F (36.8 C)  Resp: 18   Filed Vitals:   12/12/12 1032  Height: 5' 5.5" (1.664 m)  Weight: 192 lb (87.091 kg)   Body mass index is 31.45 kg/(m^2). Ideal Body Weight: Weight in (lb) to have BMI = 25: 152.2  GEN: WDWN, NAD, Non-toxic, A & O x 3 HEENT: Atraumatic, Normocephalic. Neck supple. No masses, No LAD. Ears and Nose: No external deformity. CV: RRR, No M/G/R. No JVD. No thrill. No extra heart sounds. PULM: CTA B, no wheezes, crackles, rhonchi. No retractions. No resp. distress. No accessory muscle use. ABD: S, NT, ND, +BS. No rebound. No HSM. EXTR: No c/c/e NEURO Normal  gait.  PSYCH: Normally interactive. Conversant. Not depressed or anxious appearing.  Calm demeanor.    Assessment and Plan: bronchritis zpak tussionex   Signed,  Phillips Odor, MD

## 2012-12-12 NOTE — Patient Instructions (Signed)

## 2012-12-14 ENCOUNTER — Emergency Department (HOSPITAL_COMMUNITY)
Admission: EM | Admit: 2012-12-14 | Discharge: 2012-12-14 | Disposition: A | Discharge status: Home / Self Care | Payer: 59 | Attending: Emergency Medicine | Admitting: Emergency Medicine

## 2012-12-14 ENCOUNTER — Encounter (HOSPITAL_COMMUNITY): Payer: Self-pay | Admitting: Emergency Medicine

## 2012-12-14 DIAGNOSIS — R109 Unspecified abdominal pain: Secondary | ICD-10-CM

## 2012-12-14 DIAGNOSIS — Z8742 Personal history of other diseases of the female genital tract: Secondary | ICD-10-CM | POA: Insufficient documentation

## 2012-12-14 DIAGNOSIS — Z9089 Acquired absence of other organs: Secondary | ICD-10-CM | POA: Insufficient documentation

## 2012-12-14 DIAGNOSIS — Z90710 Acquired absence of both cervix and uterus: Secondary | ICD-10-CM | POA: Insufficient documentation

## 2012-12-14 DIAGNOSIS — J45909 Unspecified asthma, uncomplicated: Secondary | ICD-10-CM | POA: Insufficient documentation

## 2012-12-14 DIAGNOSIS — Z87442 Personal history of urinary calculi: Secondary | ICD-10-CM | POA: Insufficient documentation

## 2012-12-14 DIAGNOSIS — E785 Hyperlipidemia, unspecified: Secondary | ICD-10-CM | POA: Insufficient documentation

## 2012-12-14 DIAGNOSIS — Z9889 Other specified postprocedural states: Secondary | ICD-10-CM | POA: Insufficient documentation

## 2012-12-14 DIAGNOSIS — R1013 Epigastric pain: Secondary | ICD-10-CM | POA: Insufficient documentation

## 2012-12-14 DIAGNOSIS — R112 Nausea with vomiting, unspecified: Secondary | ICD-10-CM | POA: Insufficient documentation

## 2012-12-14 DIAGNOSIS — Z9851 Tubal ligation status: Secondary | ICD-10-CM | POA: Insufficient documentation

## 2012-12-14 DIAGNOSIS — Z8719 Personal history of other diseases of the digestive system: Secondary | ICD-10-CM | POA: Insufficient documentation

## 2012-12-14 DIAGNOSIS — Z79899 Other long term (current) drug therapy: Secondary | ICD-10-CM | POA: Insufficient documentation

## 2012-12-14 LAB — CBC WITH DIFFERENTIAL/PLATELET
Basophils Absolute: 0.1 10*3/uL (ref 0.0–0.1)
Eosinophils Relative: 1 % (ref 0–5)
Lymphocytes Relative: 26 % (ref 12–46)
Neutro Abs: 5.7 10*3/uL (ref 1.7–7.7)
Platelets: 260 10*3/uL (ref 150–400)
RDW: 13.2 % (ref 11.5–15.5)
WBC: 8.5 10*3/uL (ref 4.0–10.5)

## 2012-12-14 LAB — COMPREHENSIVE METABOLIC PANEL
Alkaline Phosphatase: 71 U/L (ref 39–117)
BUN: 9 mg/dL (ref 6–23)
Chloride: 100 mEq/L (ref 96–112)
GFR calc Af Amer: >90 mL/min (ref >90)
Glucose, Bld: 133 mg/dL — ABNORMAL HIGH (ref 70–99)
Potassium: 4 mEq/L (ref 3.5–5.1)
Total Bilirubin: 0.5 mg/dL (ref 0.3–1.2)

## 2012-12-14 MED ORDER — SODIUM CHLORIDE 0.9 % IV BOLUS (SEPSIS)
1000.0000 mL | Freq: Once | INTRAVENOUS | Status: AC
  Administered 2012-12-14: 1000 mL via INTRAVENOUS

## 2012-12-14 MED ORDER — HYDROMORPHONE HCL PF 1 MG/ML IJ SOLN
1.0000 mg | Freq: Once | INTRAMUSCULAR | Status: AC
  Administered 2012-12-14: 1 mg via INTRAVENOUS
  Filled 2012-12-14: qty 1

## 2012-12-14 MED ORDER — ONDANSETRON HCL 4 MG PO TABS: 4.0000 mg | ORAL_TABLET | Freq: Four times a day (QID) | ORAL | Status: DC

## 2012-12-14 MED ORDER — ONDANSETRON HCL 4 MG/2ML IJ SOLN
4.0000 mg | Freq: Once | INTRAMUSCULAR | Status: AC
  Administered 2012-12-14: 4 mg via INTRAVENOUS
  Filled 2012-12-14: qty 2

## 2012-12-14 MED ORDER — METOCLOPRAMIDE HCL 5 MG/ML IJ SOLN
10.0000 mg | Freq: Once | INTRAMUSCULAR | Status: AC
  Administered 2012-12-14: 10 mg via INTRAVENOUS
  Filled 2012-12-14: qty 2

## 2012-12-14 MED ORDER — HYDROCODONE-ACETAMINOPHEN 5-325 MG PO TABS: 1.0000 | ORAL_TABLET | ORAL | Status: DC | PRN

## 2012-12-14 MED ORDER — MORPHINE SULFATE 4 MG/ML IJ SOLN
4.0000 mg | Freq: Once | INTRAMUSCULAR | Status: AC
Start: 2012-12-14 — End: 2012-12-14
  Administered 2012-12-14: 4 mg via INTRAVENOUS
  Filled 2012-12-14: qty 1

## 2012-12-14 NOTE — ED Notes (Signed)
Pt c/o centralized abd pain, NV since 6 am.

## 2012-12-14 NOTE — ED Provider Notes (Signed)
Medical screening examination/treatment/procedure(s) were performed by non-physician practitioner and as supervising physician I was immediately available for consultation/collaboration.  Lyanne Co, MD 12/14/12 [phone]

## 2012-12-14 NOTE — ED Provider Notes (Signed)
Date: 12/14/2012  Rate: 93  Rhythm: normal sinus rhythm  QRS Axis: normal  Intervals: normal  ST/T Wave abnormalities: normal  Conduction Disutrbances: none  Narrative Interpretation:   Old EKG Reviewed: No significant changes noted     Lyanne Co, MD 12/14/12 [phone]

## 2012-12-14 NOTE — Discharge Instructions (Signed)

## 2012-12-14 NOTE — ED Provider Notes (Signed)
CSN: [id number]     Arrival date & time 12/14/12  0756 History     First MD Initiated Contact with Patient 12/14/12 0759     Chief Complaint  Patient presents with  . Abdominal Pain  . Nausea  . Emesis   (Consider location/radiation/quality/duration/timing/severity/associated sxs/prior Treatment) HPI  Kendra Gonzalez is a 42 y.o.female with a significant PMH of endometriosis, hyperlipidemia, kidney stone, asthma, allergies, and pancreatitis presents to the ER with complaints of acute abdominal pain that started at 6 am this morning. She has also had some nausea with it but no vomiting. She appears very uncomfortable. She has a hx of pancreatitis and feels that this is the same problem. Unknown cause for pancreatitis. Hx of cholecystectomy and  hysteromyotomy. Denies diarrhea, fevers, weakness, chest pain, SOB. Pt felt no symptoms yesterday.   Past Medical History  Diagnosis Date  . Endometriosis   . Hyperlipidemia   . Kidney stones   . Asthma   . Allergy   . Pancreatitis    Past Surgical History  Procedure Laterality Date  . Cholecystectomy    . Abdominal hysterectomy    . Oophrectomy  2011    secondary to cysts  . Carpel tunnel    . Arthroscopic knee surgery     . Hip surgery    . Ercp  01/08/2012    Procedure: ENDOSCOPIC RETROGRADE CHOLANGIOPANCREATOGRAPHY (ERCP);  Surgeon: Theda Belfast, MD;  Location: [facility] ENDOSCOPY;  Service: Endoscopy;  Laterality: N/A;  . Tubal ligation     Family History  Problem Relation Age of Onset  . Hypertension Other   . Heart disease Daughter    History  Substance Use Topics  . Smoking status: Never Smoker   . Smokeless tobacco: Never Used  . Alcohol Use: No   OB History   Grav Para Term Preterm Abortions TAB SAB Ect Mult Living                 Review of Systems ROS is negative unless otherwise stated in the HPI  Allergies  Review of patient's allergies indicates no known allergies.  Home Medications   Current Outpatient  Rx  Name  Route  Sig  Dispense  Refill  . albuterol (PROVENTIL HFA;VENTOLIN HFA) 108 (90 BASE) MCG/ACT inhaler   Inhalation   Inhale 2 puffs into the lungs every 6 (six) hours as needed for wheezing or shortness of breath.          . chlorpheniramine-HYDROcodone (TUSSIONEX) 10-8 MG/5ML LQCR   Oral   Take 5 mL by mouth every 12 (twelve) hours as needed (for cough).         . Fluticasone-Salmeterol (ADVAIR) 250-50 MCG/DOSE AEPB   Inhalation   Inhale 2 puffs into the lungs every 12 (twelve) hours.         Marland Kitchen levocetirizine (XYZAL) 5 MG tablet   Oral   Take 5 mg by mouth every evening.         . magnesium gluconate (MAGONATE) 500 MG tablet   Oral   Take 500 mg by mouth at bedtime.         . traMADol (ULTRAM) 50 MG tablet   Oral   Take 1 tablet (50 mg total) by mouth every 8 (eight) hours as needed for pain.   30 tablet   0   . HYDROcodone-acetaminophen (NORCO/VICODIN) 5-325 MG per tablet   Oral   Take 1-2 tablets by mouth every 4 (four) hours as needed for pain.  15 tablet   0   . ondansetron (ZOFRAN) 4 MG tablet   Oral   Take 1 tablet (4 mg total) by mouth every 6 (six) hours.   12 tablet   0    BP 154/87  Temp(Src) 97.1 F (36.2 C) (Oral)  Resp 17  SpO2 100%  LMP 06/06/1996 Physical Exam  Nursing note and vitals reviewed. Constitutional: She appears well-developed and well-nourished. She appears distressed.  HENT:  Head: Normocephalic and atraumatic.     Eyes: Pupils are equal, round, and reactive to light.  Neck: Normal range of motion. Neck supple.  Cardiovascular: Normal rate and regular rhythm.   Pulmonary/Chest: Effort normal.  Abdominal: Soft. There is tenderness in the epigastric area. There is guarding. There is no rigidity, no rebound, no CVA tenderness, no tenderness at McBurney's point and negative Murphy's sign.    Neurological: She is alert.  Skin: Skin is warm and dry.    ED Course   Procedures (including critical care  time)  Labs Reviewed  COMPREHENSIVE METABOLIC PANEL - Abnormal; Notable for the following:    Glucose, Bld 133 (*)    All other components within normal limits  LIPASE, BLOOD  CBC WITH DIFFERENTIAL   No results found. 1. Abdominal pain     MDM  Patient given 2 doses of IV Dilaudid and monitored for 2 hours afterwards, she continues to be pain free and would like to go home. She does not have a gall bladder and her labs are unremarkable. Imaging not required at this time. Will discharge with nausea and pain medication but she has been instructed to come back to the ED if pain returns. Unsure of etiology of her pain but suspect possible peptic ulcer or pancreatitis although lipase is normal.  41 y.o.Kendra Gonzalez's evaluation in the Emergency Department is complete. It has been determined that no acute conditions requiring further emergency intervention are present at this time. The patient/guardian have been advised of the diagnosis and plan. We have discussed signs and symptoms that warrant return to the ED, such as changes or worsening in symptoms.  Vital signs are stable at discharge. Filed Vitals:   12/14/12 1018  BP: 154/87  Temp: 97.1 F (36.2 C)  Resp: 17    Patient/guardian has voiced understanding and agreed to follow-up with the PCP or specialist.   Dorthula Matas, PA-C 12/14/12 1147

## 2012-12-20 ENCOUNTER — Telehealth: Payer: Self-pay

## 2012-12-20 NOTE — Telephone Encounter (Signed)
PT WOULD LIKE TO KNOW IF WE WOULD WRITE A MEDICAL CLEARANCE FOR HER TO HAVE SURGERY WITHOUT COMING BACK IN [place] CALL [phone]

## 2012-12-20 NOTE — Telephone Encounter (Signed)
No, she needs an office visit. Patient advised

## 2012-12-24 ENCOUNTER — Ambulatory Visit (INDEPENDENT_AMBULATORY_CARE_PROVIDER_SITE_OTHER): Payer: 59 | Admitting: Emergency Medicine

## 2012-12-24 VITALS — BP 128/78 | HR 85 | Temp 98.0°F | Resp 16 | Ht 64.5 in | Wt 194.0 lb

## 2012-12-24 DIAGNOSIS — Z01818 Encounter for other preprocedural examination: Secondary | ICD-10-CM

## 2012-12-24 DIAGNOSIS — N760 Acute vaginitis: Secondary | ICD-10-CM

## 2012-12-24 LAB — POCT URINALYSIS DIPSTICK
Blood, UA: NEGATIVE
Ketones, UA: NEGATIVE
Protein, UA: NEGATIVE
Spec Grav, UA: 1.02
pH, UA: 7.5

## 2012-12-24 LAB — COMPREHENSIVE METABOLIC PANEL
BUN: 11 mg/dL (ref 6–23)
CO2: 29 mEq/L (ref 19–32)
Calcium: 9.9 mg/dL (ref 8.4–10.5)
Creat: 0.82 mg/dL (ref 0.50–1.10)
Glucose, Bld: 87 mg/dL (ref 70–99)
Total Bilirubin: 0.8 mg/dL (ref 0.3–1.2)

## 2012-12-24 LAB — POCT WET PREP WITH KOH
Clue Cells Wet Prep HPF POC: NEGATIVE
RBC Wet Prep HPF POC: NEGATIVE
Yeast Wet Prep HPF POC: NEGATIVE

## 2012-12-24 LAB — POCT CBC
MCH, POC: 30.3 pg (ref 27–31.2)
MCV: 93.5 fL (ref 80–97)
MID (cbc): 0.4 (ref 0–0.9)
POC LYMPH PERCENT: 38.4 %L (ref 10–50)
Platelet Count, POC: 297 10*3/uL (ref 142–424)
RBC: 4.26 M/uL (ref 4.04–5.48)
WBC: 6.2 10*3/uL (ref 4.6–10.2)

## 2012-12-24 NOTE — Progress Notes (Signed)
  Subjective:    Patient ID: Kendra Gonzalez, female    DOB: 03-17-1971, 42 y.o.   MRN: [id number]  HPI  Wellness check for 42 yo AA female for clearance for left hip surgery at [facility].  Pt states she has a labral tear of the Left hip over the last 6 months.  Non smoker, non drinker,  Exercises at the gym regularly.       Review of Systems     Objective:   Physical Exam Thyroid normal Chest is clear to auscultation and percussion. Heart regular rate no murmurs rubs adopt. Abdomen soft liver and spleen not enlarged. Extremities are without edema.  Results for orders placed in visit on 12/24/12  POCT CBC      Result Value Range   WBC 6.2  4.6 - 10.2 K/uL   Lymph, poc 2.4  0.6 - 3.4   POC LYMPH PERCENT 38.4  10 - 50 %L   MID (cbc) 0.4  0 - 0.9   POC MID % 6.6  0 - 12 %M   POC Granulocyte 3.4  2 - 6.9   Granulocyte percent 55.0  37 - 80 %G   RBC 4.26  4.04 - 5.48 M/uL   Hemoglobin 12.9  12.2 - 16.2 g/dL   HCT, POC 14.7  82.9 - 47.9 %   MCV 93.5  80 - 97 fL   MCH, POC 30.3  27 - 31.2 pg   MCHC 32.4  31.8 - 35.4 g/dL   RDW, POC 56.2     Platelet Count, POC 297  142 - 424 K/uL   MPV 8.9  0 - 99.8 fL  GLUCOSE, POCT (MANUAL RESULT ENTRY)      Result Value Range   POC Glucose 86  70 - 99 mg/dl  POCT WET PREP WITH KOH      Result Value Range   Trichomonas, UA Negative     Clue Cells Wet Prep HPF POC neg     Epithelial Wet Prep HPF POC 3-8     Yeast Wet Prep HPF POC neg     Bacteria Wet Prep HPF POC 1+     RBC Wet Prep HPF POC neg     WBC Wet Prep HPF POC 0-1     KOH Prep POC Negative    POCT URINALYSIS DIPSTICK      Result Value Range   Color, UA yellow     Clarity, UA clear     Glucose, UA neg     Bilirubin, UA neg     Ketones, UA neg     Spec Grav, UA 1.020     Blood, UA neg     pH, UA 7.5     Protein, UA neg     Urobilinogen, UA 0.2     Nitrite, UA neg     Leukocytes, UA Negative      EKG shows benign early repolarization no acute changes     Assessment & Plan:  Patient is at average risk for surgery at her age. She has no ongoing medical problems identified. Her EKG shows benign early repolarization. Wet prep and urine testing were normal.

## 2012-12-29 ENCOUNTER — Telehealth: Payer: Self-pay

## 2012-12-29 NOTE — Telephone Encounter (Signed)
Patient states that she needs documentation in writing that she is cleared to have surgery. States she was given surgical clearance on 12/24/2012. Patient would like this faxed to: [facility]ention [facility]); fax number [phone]. Further questions please call patient at [phone].

## 2012-12-29 NOTE — Telephone Encounter (Signed)
done

## 2013-01-04 DIAGNOSIS — J45909 Unspecified asthma, uncomplicated: Secondary | ICD-10-CM | POA: Insufficient documentation

## 2013-01-11 ENCOUNTER — Ambulatory Visit (INDEPENDENT_AMBULATORY_CARE_PROVIDER_SITE_OTHER): Payer: 59 | Admitting: Physician Assistant

## 2013-01-11 VITALS — BP 116/80 | HR 100 | Temp 98.9°F | Resp 18 | Ht 65.25 in | Wt 186.2 lb

## 2013-01-11 DIAGNOSIS — R9389 Abnormal findings on diagnostic imaging of other specified body structures: Secondary | ICD-10-CM

## 2013-01-11 NOTE — Progress Notes (Signed)
   [address] [place] [address]   Phone [phone]  Subjective:    Patient ID: Kendra Gonzalez, female    DOB: November 02, 1970, 42 y.o.   MRN: [id number]  HPI Pt presents today to see if she needs to have a repeat CT on her chest.  She was going through her medical records online and noticed that in Nov when she had an abd ct that they saw a nodule in her lungs when she went to the ED and the report stated that she needed a repeat CT and she wants to know if I can order it for her.  She is having no chest/lung symptoms.  She is not a smoker.   Review of Systems  Respiratory: Negative for cough and shortness of breath.   Cardiovascular: Negative for chest pain.       Objective:   Physical Exam  Vitals reviewed. Constitutional: She is oriented to person, place, and time. She appears well-developed and well-nourished.  HENT:  Head: Normocephalic and atraumatic.  Right Ear: External ear normal.  Left Ear: External ear normal.  Eyes: Conjunctivae are normal.  Neck: Normal range of motion.  Cardiovascular: Normal rate, regular rhythm and normal heart sounds.   Pulmonary/Chest: Effort normal and breath sounds normal.  Neurological: She is alert and oriented to person, place, and time.  Skin: Skin is warm and dry.  Psychiatric: She has a normal mood and affect. Her behavior is normal. Judgment and thought content normal.       Assessment & Plan:  Abnormal CT - report from [facility] states repeat CT for lungs in 12 months due to subpleural 6mm nodule.  I will call and make sure they just want aCT chest without contrast and then we will repeat in November at [facility] for a good comparison.  Benny Lennert PA-C 01/11/2013 9:47 PM

## 2013-01-17 ENCOUNTER — Other Ambulatory Visit: Payer: Self-pay | Admitting: Physician Assistant

## 2013-01-17 DIAGNOSIS — R9389 Abnormal findings on diagnostic imaging of other specified body structures: Secondary | ICD-10-CM

## 2013-01-20 ENCOUNTER — Ambulatory Visit
Admission: RE | Admit: 2013-01-20 | Discharge: 2013-01-20 | Disposition: A | Discharge status: Home / Self Care | Payer: 59 | Source: Ambulatory Visit | Attending: Physician Assistant | Admitting: Physician Assistant

## 2013-01-20 DIAGNOSIS — R9389 Abnormal findings on diagnostic imaging of other specified body structures: Secondary | ICD-10-CM

## 2013-02-27 ENCOUNTER — Ambulatory Visit
Admission: RE | Admit: 2013-02-27 | Discharge: 2013-02-27 | Disposition: A | Discharge status: Home / Self Care | Payer: 59 | Source: Ambulatory Visit | Attending: Family Medicine | Admitting: Family Medicine

## 2013-02-27 ENCOUNTER — Other Ambulatory Visit: Payer: 59

## 2013-02-27 DIAGNOSIS — Z87898 Personal history of other specified conditions: Secondary | ICD-10-CM

## 2013-02-27 DIAGNOSIS — N649 Disorder of breast, unspecified: Secondary | ICD-10-CM

## 2013-04-06 ENCOUNTER — Ambulatory Visit (INDEPENDENT_AMBULATORY_CARE_PROVIDER_SITE_OTHER): Payer: 59 | Admitting: Physician Assistant

## 2013-04-06 VITALS — BP 118/64 | HR 97 | Temp 98.3°F | Resp 18 | Ht 64.5 in | Wt 192.0 lb

## 2013-04-06 DIAGNOSIS — N6009 Solitary cyst of unspecified breast: Secondary | ICD-10-CM

## 2013-04-06 DIAGNOSIS — N898 Other specified noninflammatory disorders of vagina: Secondary | ICD-10-CM

## 2013-04-06 DIAGNOSIS — R319 Hematuria, unspecified: Secondary | ICD-10-CM

## 2013-04-06 DIAGNOSIS — R350 Frequency of micturition: Secondary | ICD-10-CM

## 2013-04-06 DIAGNOSIS — N390 Urinary tract infection, site not specified: Secondary | ICD-10-CM

## 2013-04-06 DIAGNOSIS — B9689 Other specified bacterial agents as the cause of diseases classified elsewhere: Secondary | ICD-10-CM

## 2013-04-06 LAB — POCT UA - MICROSCOPIC ONLY
Mucus, UA: POSITIVE
Yeast, UA: NEGATIVE

## 2013-04-06 LAB — POCT WET PREP WITH KOH: Yeast Wet Prep HPF POC: NEGATIVE

## 2013-04-06 LAB — POCT URINALYSIS DIPSTICK
Bilirubin, UA: NEGATIVE
Nitrite, UA: NEGATIVE

## 2013-04-06 MED ORDER — AMBULATORY NON FORMULARY MEDICATION: 1.0000 | Status: DC

## 2013-04-06 MED ORDER — NITROFURANTOIN MONOHYD MACRO 100 MG PO CAPS: 100.0000 mg | ORAL_CAPSULE | Freq: Two times a day (BID) | ORAL | Status: AC

## 2013-04-06 NOTE — Progress Notes (Signed)
   Subjective:    Patient ID: Kendra Gonzalez, female    DOB: 1971-01-29, 42 y.o.   MRN: [id number]  HPI Pt presents to clinic with vaginal discharge and irritation with urinary frequency and urgency with bladder pressure.  She has had no new sexual partners.   Review of Systems  Constitutional: Negative for fever and chills.  Gastrointestinal: Negative for nausea and abdominal pain.  Genitourinary: Positive for dysuria, frequency, vaginal bleeding and vaginal discharge. Negative for hematuria.  Musculoskeletal: Positive for back pain.       Objective:   Physical Exam  Vitals reviewed. Constitutional: She appears well-developed and well-nourished.  HENT:  Head: Normocephalic and atraumatic.  Right Ear: External ear normal.  Left Ear: External ear normal.  Eyes: Conjunctivae are normal.  Neck: Normal range of motion.  Pulmonary/Chest: Effort normal.  Abdominal: Soft. Bowel sounds are normal. There is no tenderness. There is no CVA tenderness.  Skin: Skin is warm and dry.  Psychiatric: She has a normal mood and affect. Her behavior is normal. Judgment and thought content normal.   Results for orders placed in visit on 04/06/13  POCT URINALYSIS DIPSTICK      Result Value Range   Color, UA yellow     Clarity, UA slightly cloudy     Glucose, UA neg     Bilirubin, UA neg     Ketones, UA trace     Spec Grav, UA 1.025     Blood, UA neg     pH, UA 5.5     Protein, UA neg     Urobilinogen, UA 0.2     Nitrite, UA neg     Leukocytes, UA Trace    POCT UA - MICROSCOPIC ONLY      Result Value Range   WBC, Ur, HPF, POC 2-6     RBC, urine, microscopic 0-2     Bacteria, U Microscopic 1+     Mucus, UA pos     Epithelial cells, urine per micros 0-4     Crystals, Ur, HPF, POC neg     Casts, Ur, LPF, POC neg     Yeast, UA neg    POCT WET PREP WITH KOH      Result Value Range   Trichomonas, UA Negative     Clue Cells Wet Prep HPF POC 5-10     Epithelial Wet Prep HPF POC 0-4       Yeast Wet Prep HPF POC neg     Bacteria Wet Prep HPF POC 4+     RBC Wet Prep HPF POC 0-4     WBC Wet Prep HPF POC 0-3     KOH Prep POC Negative         Assessment & Plan:  Vaginal discharge - Plan: POCT Wet Prep with KOH  Blood in urine - Plan: POCT urinalysis dipstick, POCT UA - Microscopic Only  Frequent urination - Plan: POCT urinalysis dipstick, POCT UA - Microscopic Only  UTI (urinary tract infection) - Plan: nitrofurantoin, macrocrystal-monohydrate, (MACROBID) 100 MG capsule, Urine culture  BV (bacterial vaginosis) - Plan:Boric acid  Benny Lennert PA-C 04/06/2013 3:47 PM

## 2013-04-08 ENCOUNTER — Ambulatory Visit: Payer: 59

## 2013-04-08 ENCOUNTER — Ambulatory Visit (INDEPENDENT_AMBULATORY_CARE_PROVIDER_SITE_OTHER): Payer: 59 | Admitting: Family Medicine

## 2013-04-08 VITALS — BP 122/72 | HR 77 | Temp 98.2°F | Resp 17 | Ht 64.5 in | Wt 190.0 lb

## 2013-04-08 DIAGNOSIS — S80212A Abrasion, left knee, initial encounter: Secondary | ICD-10-CM

## 2013-04-08 DIAGNOSIS — M79641 Pain in right hand: Secondary | ICD-10-CM

## 2013-04-08 DIAGNOSIS — M25562 Pain in left knee: Secondary | ICD-10-CM

## 2013-04-08 DIAGNOSIS — M79609 Pain in unspecified limb: Secondary | ICD-10-CM

## 2013-04-08 DIAGNOSIS — IMO0002 Reserved for concepts with insufficient information to code with codable children: Secondary | ICD-10-CM

## 2013-04-08 DIAGNOSIS — M25519 Pain in unspecified shoulder: Secondary | ICD-10-CM

## 2013-04-08 MED ORDER — TRAMADOL HCL 50 MG PO TABS: 50.0000 mg | ORAL_TABLET | Freq: Three times a day (TID) | ORAL | Status: DC | PRN

## 2013-04-08 NOTE — Progress Notes (Signed)
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 She was just here 2 days ago for a urinary tract infection which is doing better. She got out of her vehicle did not see a wire and a black disc of some sort on the grounds that she tripped on and landed on the sidewalk, getting an abrasion of her left knee and hurting her right hand. Knee and hand were okay prior to this.  Objective:  Right TM is tender in the lateral metacarpals, primarily the fifth. Motion of fingers is good. It hurts her current spread her fingers. Wrist shoulder and elbow all seemed fine.  The left knee has a couple of 3 x 2 cm. Is just over the base of the patella down toward the tibial tuberosity. She just scraped off the superficial layer of skin. There is a surrounding border of 1-2 cm of minimal abrasion also. This whole area is very tender. Especially the patella and tibial tuberosities. The major bones do not seem to be tender.  Assessment: Knee contusion and abrasion Hand contusion and pain  Plan: X-ray hand and knee  [facility] reading (PRIMARY) by  Dr. Alwyn Ren 5th metatarsal has a small line which may represent a nondisplaced fx vs a penetrating vein.  Will get overread stat.  Knee has old medial distal femur avulsion fx vs atypical bony formation.  No patellar or other anterior bony abnormality.  Good joint space..  Just does not see a fracture line, though she has possibly had some old trauma to that fifth metacarpal  Treat symptomatically with Ace wrap and tramadol and OTC ibuprofen

## 2013-04-08 NOTE — Patient Instructions (Addendum)
Apply ice to hand and knee for about 15 minutes for 4-5 times daily for the next couple of days  Take ibuprofen 800 mg 3 times daily as needed for pain (4 of the over-the-counter 200 mg pills = 800 milligrams)  Tramadol one every 6 hours for pain  Return if not improving

## 2013-04-09 LAB — URINE CULTURE: Colony Count: 95000

## 2013-04-18 ENCOUNTER — Ambulatory Visit (INDEPENDENT_AMBULATORY_CARE_PROVIDER_SITE_OTHER): Payer: 59 | Admitting: Family Medicine

## 2013-04-18 ENCOUNTER — Other Ambulatory Visit: Payer: Self-pay | Admitting: *Deleted

## 2013-04-18 VITALS — BP 130/78 | HR 100 | Temp 99.5°F | Resp 16 | Ht 64.0 in | Wt 191.2 lb

## 2013-04-18 DIAGNOSIS — J111 Influenza due to unidentified influenza virus with other respiratory manifestations: Secondary | ICD-10-CM

## 2013-04-18 LAB — POCT INFLUENZA A/B
Influenza A, POC: NEGATIVE
Influenza B, POC: NEGATIVE

## 2013-04-18 MED ORDER — AMANTADINE HCL 100 MG PO CAPS: 100.0000 mg | ORAL_CAPSULE | Freq: Two times a day (BID) | ORAL | Status: DC

## 2013-04-18 MED ORDER — HYDROCODONE-HOMATROPINE 5-1.5 MG/5ML PO SYRP: 5.0000 mL | ORAL_SOLUTION | Freq: Three times a day (TID) | ORAL | Status: DC | PRN

## 2013-04-18 MED ORDER — OSELTAMIVIR PHOSPHATE 75 MG PO CAPS: 75.0000 mg | ORAL_CAPSULE | Freq: Two times a day (BID) | ORAL | Status: DC

## 2013-04-18 NOTE — Progress Notes (Signed)
Patient ID: NAVEYAH IACOVELLI MRN: [id number], DOB: 22-Jan-1971, 42 y.o. Date of Encounter: 04/18/2013, 4:37 PM  Primary Physician: Virgilio Belling  Chief Complaint:  Chief Complaint  Patient presents with  . Cough    X Sunday  . Generalized Body Aches    X Sunday  . Headache    X Sunday    HPI: 42 y.o. year old female presents with a 2 day history of nasal congestion, post nasal drip, sore throat, and cough. Mild sinus pressure. Afebrile. No chills. Nasal congestion thick and green/yellow. Cough is productive of green/yellow sputum and not associated with time of day. Ears feel full, leading to sensation of muffled hearing. Has tried OTC cold preps without success. No GI complaints. Appetite poor  No sick contacts, recent antibiotics, or recent travels.   No leg trauma, sedentary periods, h/o cancer, or tobacco use.  Past Medical History  Diagnosis Date  . Endometriosis   . Hyperlipidemia   . Kidney stones   . Asthma   . Allergy   . Pancreatitis      Home Meds: Prior to Admission medications   Medication Sig Start Date End Date Taking? Authorizing Provider  albuterol (PROVENTIL HFA;VENTOLIN HFA) 108 (90 BASE) MCG/ACT inhaler Inhale 2 puffs into the lungs every 6 (six) hours as needed for wheezing or shortness of breath.    Yes Historical Provider, MD  AMBULATORY NON FORMULARY MEDICATION Place 1 capsule vaginally as directed. Boric Acid - 1 PV qhs every other night for 4 doses - 04/06/13  Yes Morrell Riddle, PA-C  Fluticasone-Salmeterol (ADVAIR) 250-50 MCG/DOSE AEPB Inhale 2 puffs into the lungs every 12 (twelve) hours.   Yes Historical Provider, MD  levocetirizine (XYZAL) 5 MG tablet Take 5 mg by mouth every evening.   Yes Historical Provider, MD  magnesium gluconate (MAGONATE) 500 MG tablet Take 500 mg by mouth at bedtime.   Yes Historical Provider, MD  pantoprazole (PROTONIX) 40 MG tablet Take 40 mg by mouth daily.   Yes Historical Provider, MD  traMADol (ULTRAM) 50  MG tablet Take 1 tablet (50 mg total) by mouth every 8 (eight) hours as needed. 04/08/13   Peyton Najjar, MD    Allergies: No Known Allergies  History   Social History  . Marital Status: Married    Spouse Name: N/A    Number of Children: N/A  . Years of Education: N/A   Occupational History  . Not on file.   Social History Main Topics  . Smoking status: Never Smoker   . Smokeless tobacco: Never Used  . Alcohol Use: No  . Drug Use: No  . Sexual Activity: Yes    Birth Control/ Protection: None   Other Topics Concern  . Not on file   Social History Narrative   Works as a Radiation protection practitioner.  Married with 60 child, 55 year old son.     Review of Systems: Constitutional: positive for chills, fever, night sweats  Negative for weight changes Cardiovascular: negative for chest pain or palpitations Respiratory: negative for hemoptysis, wheezing, or shortness of breath Abdominal: negative for abdominal pain, nausea, vomiting or diarrhea Dermatological: negative for rash Neurologic: negative for headache   Physical Exam: Blood pressure 130/78, pulse 100, temperature 99.5 F (37.5 C), temperature source Oral, resp. rate 16, height 5\' 4"  (1.626 m), weight 191 lb 3.2 oz (86.728 kg), last menstrual period 06/06/1996, SpO2 100.00%., Body mass index is 32.8 kg/(m^2). General: Well developed, well nourished, in no  acute distress. Head: Normocephalic, atraumatic, eyes without discharge, sclera non-icteric, nares are congested. Bilateral auditory canals clear, TM's are without perforation, pearly grey with reflective cone of light bilaterally. No sinus TTP. Oral cavity moist, dentition normal. Posterior pharynx with post nasal drip and mild erythema. No peritonsillar abscess or tonsillar exudate. Neck: Supple. No thyromegaly. Full ROM. No lymphadenopathy. Lungs: Coarse breath sounds bilaterally without wheezes, rales, or rhonchi. Breathing is unlabored.  Heart: RRR with S1 S2. No  murmurs, rubs, or gallops appreciated. Msk:  Strength and tone normal for age. Extremities: No clubbing or cyanosis. No edema. Neuro: Alert and oriented X 3. Moves all extremities spontaneously. CNII-XII grossly in tact. Psych:  Responds to questions appropriately with a normal affect.   Results for orders placed in visit on 04/18/13  POCT INFLUENZA A/B      Result Value Range   Influenza A, POC Negative     Influenza B, POC Negative       ASSESSMENT AND PLAN:  42 y.o. year old female with influenza Influenza - Plan: POCT Influenza A/B, oseltamivir (TAMIFLU) 75 MG capsule, HYDROcodone-homatropine (HYCODAN) 5-1.5 MG/5ML syrup   - -Mucinex -Tylenol/Motrin prn -Rest/fluids -RTC precautions -RTC 3-5 days if no improvement  Signed, Elvina Sidle, MD 04/18/2013 4:37 PM

## 2013-04-18 NOTE — Patient Instructions (Signed)

## 2013-08-16 ENCOUNTER — Ambulatory Visit (INDEPENDENT_AMBULATORY_CARE_PROVIDER_SITE_OTHER): Payer: 59 | Admitting: Family Medicine

## 2013-08-16 VITALS — BP 102/86 | HR 70 | Temp 98.2°F | Ht 62.0 in | Wt 195.0 lb

## 2013-08-16 DIAGNOSIS — M25512 Pain in left shoulder: Secondary | ICD-10-CM

## 2013-08-16 DIAGNOSIS — M25519 Pain in unspecified shoulder: Secondary | ICD-10-CM

## 2013-08-16 DIAGNOSIS — M25532 Pain in left wrist: Secondary | ICD-10-CM

## 2013-08-16 DIAGNOSIS — G5600 Carpal tunnel syndrome, unspecified upper limb: Secondary | ICD-10-CM

## 2013-08-16 DIAGNOSIS — M25539 Pain in unspecified wrist: Secondary | ICD-10-CM

## 2013-08-16 NOTE — Patient Instructions (Signed)
You likely have a flair of carpaI tunnel syndrome, but may have a component of "Dequervain's Tenosynovitis " - see the information below.  Ice to area ok if needed, tylenol if needed (avoid nsaids with history of ulcer), but wear splint until soreness improves, then as needed to lessen use of that wrist. If not improving in next 2 weeks - recheck. Return to the clinic or go to the nearest emergency room if any of your symptoms worsen or new symptoms occur.   Carpal Tunnel Syndrome The carpal tunnel is a narrow area located on the palm side of your wrist. The tunnel is formed by the wrist bones and ligaments. Nerves, blood vessels, and tendons pass through the carpal tunnel. Repeated wrist motion or certain diseases may cause swelling within the tunnel. This swelling pinches the main nerve in the wrist (median nerve) and causes the painful hand and arm condition called carpal tunnel syndrome. CAUSES   Repeated wrist motions.  Wrist injuries.  Certain diseases like arthritis, diabetes, alcoholism, hyperthyroidism, and kidney failure.  Obesity.  Pregnancy. SYMPTOMS   A "pins and needles" feeling in your fingers or hand.  Tingling or numbness in your fingers or hand.  An aching feeling in your entire arm.  Wrist pain that goes up your arm to your shoulder.  Pain that goes down into your palm or fingers.  A weak feeling in your hands. DIAGNOSIS  Your caregiver will take your history and perform a physical exam. An electromyography test may be needed. This test measures electrical signals sent out by the muscles. The electrical signals are usually slowed by carpal tunnel syndrome. You may also need X-rays. TREATMENT  Carpal tunnel syndrome may clear up by itself. Your caregiver may recommend a wrist splint or medicine such as a nonsteroidal anti-inflammatory medicine. Cortisone injections may help. Sometimes, surgery may be needed to free the pinched nerve.  HOME CARE INSTRUCTIONS   Take  all medicine as directed by your caregiver. Only take over-the-counter or prescription medicines for pain, discomfort, or fever as directed by your caregiver.  If you were given a splint to keep your wrist from bending, wear it as directed. It is important to wear the splint at night. Wear the splint for as long as you have pain or numbness in your hand, arm, or wrist. This may take 1 to 2 months.  Rest your wrist from any activity that may be causing your pain. If your symptoms are work-related, you may need to talk to your employer about changing to a job that does not require using your wrist.  Put ice on your wrist after long periods of wrist activity.  Put ice in a plastic bag.  Place a towel between your skin and the bag.  Leave the ice on for 15-20 minutes, 03-04 times a day.  Keep all follow-up visits as directed by your caregiver. This includes any orthopedic referrals, physical therapy, and rehabilitation. Any delay in getting necessary care could result in a delay or failure of your condition to heal. SEEK IMMEDIATE MEDICAL CARE IF:   You have new, unexplained symptoms.  Your symptoms get worse and are not helped or controlled with medicines. MAKE SURE YOU:   Understand these instructions.  Will watch your condition.  Will get help right away if you are not doing well or get worse. Document Released: 04/10/2000 Document Revised: 07/06/2011 Document Reviewed: 02/27/2011 [facility] Patient Information 2014 [place], [place].  De Quervain's Tenosynovitis De Quervain's tenosynovitis involves inflammation of  one or two tendon linings (sheaths) or strain of one or two tendons to the thumb: extensor pollicis brevis (EPB), or abductor pollicis longus (APL). This causes pain on the side of the wrist and base of the thumb. Tendon sheaths secrete a fluid that lubricates the tendon, allowing the tendon to move smoothly. When the sheath becomes inflamed, the tendon cannot move freely in the  sheath. Both the EPB and APL tendons are important for proper use of the hand. The EPB tendon is important for straightening the thumb. The APL tendon is important for moving the thumb away from the index finger (abducting). The two tendons pass through a small tube (canal) in the wrist, near the base of the thumb. When the tendons become inflamed, pain is usually felt in this area. SYMPTOMS   Pain, tenderness, swelling, warmth, or redness over the base of the thumb and thumb side of the wrist.  Pain that gets worse when straightening the thumb.  Pain that gets worse when moving the thumb away from the index finger, against resistance.  Pain with pinching or gripping.  Locking or catching of the thumb.  Limited motion of the thumb.  Crackling sound (crepitation) when the tendon or thumb is moved or touched.  Fluid-filled cyst in the area of the base of the thumb. CAUSES   Tenosynovitis is often linked with overuse of the wrist.  Tenosynovitis may be caused by repeated injury to the thumb muscle and tendon units, and with repeated motions of the hand and wrist, due to friction of the tendon within the lining (sheath).  Tenosynovitis may also be due to a sudden increase in activity or change in activity. RISK INCREASES WITH:  Sports that involve repeated hand and wrist motions (golf, bowling, tennis, squash, racquetball).  Heavy labor.  Poor physical wrist strength and flexibility.  Failure to warm up properly before practice or play.  Female gender.  New mothers who hold their baby's head for long periods or lift infants with thumbs in the infant's armpit (axilla). PREVENTION  Warm up and stretch properly before practice or competition.  Allow enough time for rest and recovery between practices and competition.  Maintain appropriate conditioning:  Cardiovascular fitness.  Forearm, wrist, and hand flexibility.  Muscle strength and endurance.  Use proper exercise  technique. PROGNOSIS  This condition is usually curable within 6 weeks, if treated properly with non-surgical treatment and resting of the affected area.  RELATED COMPLICATIONS   Longer healing time if not properly treated or if not given enough time to heal.  Chronic inflammation, causing recurring symptoms of tenosynovitis. Permanent pain or restriction of movement.  Risks of surgery: infection, bleeding, injury to nerves (numbness of the thumb), continued pain, incomplete release of the tendon sheath, recurring symptoms, cutting of the tendons, tendons sliding out of position, weakness of the thumb, thumb stiffness. TREATMENT  First, treatment involves the use of medicine and ice, to reduce pain and inflammation. Patients are encouraged to stop or modify activities that aggravate the injury. Stretching and strengthening exercises may be advised. Exercises may be completed at home or with a therapist. You may be fitted with a brace or splint, to limit motion and allow the injury to heal. Your caregiver may also choose to give you a corticosteroid injection, to reduce the pain and inflammation. If non-surgical treatment is not successful, surgery may be needed. Most tenosynovitis surgeries are done as outpatient procedures (you go home the same day). Surgery may involve local, regional (whole  arm), or general anesthesia.  MEDICATION   If pain medicine is needed, nonsteroidal anti-inflammatory medicines (aspirin and ibuprofen), or other minor pain relievers (acetaminophen), are often advised.  Do not take pain medicine for 7 days before surgery.  Prescription pain relievers are often prescribed only after surgery. Use only as directed and only as much as you need.  Corticosteroid injections may be given if your caregiver thinks they are needed. There is a limited number of times these injections may be given. COLD THERAPY   Cold treatment (icing) should be applied for 10 to 15 minutes every 2  to 3 hours for inflammation and pain, and immediately after activity that aggravates your symptoms. Use ice packs or an ice massage. SEEK MEDICAL CARE IF:   Symptoms get worse or do not improve in 2 to 4 weeks, despite treatment.  You experience pain, numbness, or coldness in the hand.  Blue, gray, or dark color appears in the fingernails.  Any of the following occur after surgery: increased pain, swelling, redness, drainage of fluids, bleeding in the affected area, or signs of infection.  New, unexplained symptoms develop. (Drugs used in treatment may produce side effects.) Document Released: 04/13/2005 Document Revised: 07/06/2011 Document Reviewed: 07/26/2008 [facility] Patient Information 2014 [place], [place].

## 2013-08-16 NOTE — Progress Notes (Addendum)
Subjective:   This chart was scribed for Kendra Ray, MD by Forrestine Him, [facility] Scribe. This patient was seen in room 14 and the patient's care was started 5:24 PM.      Patient ID: Kendra Gonzalez, female    DOB: Jun 03, 1970, 43 y.o.   MRN: [id number]  HPI  HPI Comments: Kendra Gonzalez  is a dominant R handed 43 y.o. female with a PMHx of hyperlipidemia, GERD, obesity, and chronic abdominal pain who presents to [facility] complaining of sudden onset, constant, moderate L shoulder and L wrist pain x 1 day that is unchanged. Pain is worse in her wrist at this time. She states the pain was intense enough to wake her from sleep this morning around 5 AM. She has tried 2 tabs of Ibuprofen with mild temporary improvement. At this time she denies any neck pain, numbness, paresthesia, chest pain, chest pressure, rashes, skin changes, nausea, vomiting, or SOB.  She reports a bilateral carpal repair in 1999 with release. 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 No new injury or trauma. No family or personal history of heart disease or heart issues. No history of HTN or diabetes. No other concerns this visit.  Diagnosed with an abdominal ulcer in 2013. Currently she is on medication to manage symptoms.  Patient Active Problem List   Diagnosis Date Noted  . Abdominal pain, chronic, right upper quadrant 06/23/2012  . Obesity (BMI 30.0-34.9) 10/12/2011  . CANDIDIASIS, VULVA/VAGINA 02/16/2007  . HYPERLIPIDEMIA 02/16/2007  . ALLERGIC RHINITIS 02/16/2007  . GERD 02/16/2007  . GLUCOSE INTOLERANCE, HX OF 02/16/2007   Past Medical History  Diagnosis Date  . Endometriosis   . Hyperlipidemia   . Kidney stones   . Asthma   . Allergy   . Pancreatitis    Past Surgical History  Procedure Laterality Date  . Cholecystectomy    . Abdominal hysterectomy    . Oophrectomy  2011    secondary to cysts  . Carpel tunnel    .  Arthroscopic knee surgery     . Hip surgery    . Ercp  01/08/2012    Procedure: ENDOSCOPIC RETROGRADE CHOLANGIOPANCREATOGRAPHY (ERCP);  Surgeon: Beryle Beams, MD;  Location: [facility] ENDOSCOPY;  Service: Endoscopy;  Laterality: N/A;  . Tubal ligation     No Known Allergies Prior to Admission medications   Medication Sig Start Date End Date Taking? Authorizing Provider  albuterol (PROVENTIL HFA;VENTOLIN HFA) 108 (90 BASE) MCG/ACT inhaler Inhale 2 puffs into the lungs every 6 (six) hours as needed for wheezing or shortness of breath.    Yes Historical Provider, MD  Fluticasone-Salmeterol (ADVAIR) 250-50 MCG/DOSE AEPB Inhale 2 puffs into the lungs every 12 (twelve) hours.   Yes Historical Provider, MD  magnesium gluconate (MAGONATE) 500 MG tablet Take 500 mg by mouth at bedtime.   Yes Historical Provider, MD  pantoprazole (PROTONIX) 40 MG tablet Take 40 mg by mouth daily.   Yes Historical Provider, MD  amantadine (SYMMETREL) 100 MG capsule Take 1 capsule (100 mg total) by mouth 2 (two) times daily. 04/18/13   Robyn Haber, MD  AMBULATORY NON FORMULARY MEDICATION Place 1 capsule vaginally as directed. Boric Acid - 1 PV qhs every other night for 4 doses - 04/06/13   Mancel Bale, PA-C  HYDROcodone-homatropine [facility]) 5-1.5 MG/5ML syrup Take 5 mLs by mouth every 8 (eight) hours as needed for cough. 04/18/13   Robyn Haber,  MD  levocetirizine (XYZAL) 5 MG tablet Take 5 mg by mouth every evening.    Historical Provider, MD  traMADol (ULTRAM) 50 MG tablet Take 1 tablet (50 mg total) by mouth every 8 (eight) hours as needed. 04/08/13   Posey Boyer, MD   History   Social History  . Marital Status: Married    Spouse Name: N/A    Number of Children: N/A  . Years of Education: N/A   Occupational History  . Not on file.   Social History Main Topics  . Smoking status: Never Smoker   . Smokeless tobacco: Never Used  . Alcohol Use: No  . Drug Use: No  . Sexual Activity: Yes    Birth Control/  Protection: None   Other Topics Concern  . Not on file   Social History Narrative   Works as a Education officer, environmental.  Married with 22 child, 32 year old son.     Review of Systems  Constitutional: Negative for chills.  HENT: Negative for congestion.   Eyes: Negative for redness.  Respiratory: Negative for cough and shortness of breath.   Cardiovascular: Negative for chest pain.  Gastrointestinal: Negative for nausea and vomiting.  Musculoskeletal: Positive for arthralgias (L wrist and L shoulder). Negative for neck pain.  Skin: Negative for color change and rash.  Neurological: Negative for weakness and numbness.  Psychiatric/Behavioral: Negative for confusion.    Filed Vitals:   08/16/13 1618  BP: 102/86  Pulse: 70  Temp: 98.2 F (36.8 C)  TempSrc: Oral  Height: 5\' 2"  (1.575 m)  Weight: 195 lb (88.451 kg)  SpO2: 100%    Objective:  Physical Exam  Vitals reviewed. Constitutional: She is oriented to person, place, and time. She appears well-developed and well-nourished.  HENT:  Head: Normocephalic and atraumatic.  Eyes: Conjunctivae and EOM are normal. Pupils are equal, round, and reactive to light.  Neck: Carotid bruit is not present.  Cardiovascular: Normal rate, regular rhythm, normal heart sounds and intact distal pulses.   Pulmonary/Chest: Effort normal and breath sounds normal.  Abdominal: Soft. She exhibits no pulsatile midline mass. There is no tenderness.  Musculoskeletal: She exhibits tenderness. She exhibits no edema.  C spine ROM did not reproduce pain in L shoulder or wrist  L SHOULDER: [place] and AC joints both non tender No palpable tenderness along L shoulder FROM of L shoulder Full RTC ROM Full rotator cuff ROM  L ELBOW: FROM No palpable tenderness  L Wrist Finkelstien test was equivocal with slight tenderness at APL,EPB but also noted in volar wrist Positive Tinel and Phalen, with umbness or cold sensation to finger tips after phalen  test  NVI distally.   Neurological: She is alert and oriented to person, place, and time.  Skin: Skin is warm and dry.  Psychiatric: She has a normal mood and affect. Her behavior is normal.    Assessment & Plan:  Sophy Mesler Karras is a 43 y.o. female Wrist pain, left  CTS (carpal tunnel syndrome)  Left shoulder pain  Minimal L shoulder sx's, and NKI - xrays deferred. prior hx of CTS and appears to be early CTS sx's again. Avoiding NSIADs with hx of PUD, trial of wrist brace and activity modification - rtc precautions discussed. Sx care below.   No orders of the defined types were placed in this encounter.   Patient Instructions  You likely have a flair of carpaI tunnel syndrome, but may have a component of "Dequervain's Tenosynovitis " -  see the information below.  Ice to area ok if needed, tylenol if needed (avoid nsaids with history of ulcer), but wear splint until soreness improves, then as needed to lessen use of that wrist. If not improving in next 2 weeks - recheck. Return to the clinic or go to the nearest emergency room if any of your symptoms worsen or new symptoms occur.   Carpal Tunnel Syndrome The carpal tunnel is a narrow area located on the palm side of your wrist. The tunnel is formed by the wrist bones and ligaments. Nerves, blood vessels, and tendons pass through the carpal tunnel. Repeated wrist motion or certain diseases may cause swelling within the tunnel. This swelling pinches the main nerve in the wrist (median nerve) and causes the painful hand and arm condition called carpal tunnel syndrome. CAUSES   Repeated wrist motions.  Wrist injuries.  Certain diseases like arthritis, diabetes, alcoholism, hyperthyroidism, and kidney failure.  Obesity.  Pregnancy. SYMPTOMS   A "pins and needles" feeling in your fingers or hand.  Tingling or numbness in your fingers or hand.  An aching feeling in your entire arm.  Wrist pain that goes up your arm to your  shoulder.  Pain that goes down into your palm or fingers.  A weak feeling in your hands. DIAGNOSIS  Your caregiver will take your history and perform a physical exam. An electromyography test may be needed. This test measures electrical signals sent out by the muscles. The electrical signals are usually slowed by carpal tunnel syndrome. You may also need X-rays. TREATMENT  Carpal tunnel syndrome may clear up by itself. Your caregiver may recommend a wrist splint or medicine such as a nonsteroidal anti-inflammatory medicine. Cortisone injections may help. Sometimes, surgery may be needed to free the pinched nerve.  HOME CARE INSTRUCTIONS   Take all medicine as directed by your caregiver. Only take over-the-counter or prescription medicines for pain, discomfort, or fever as directed by your caregiver.  If you were given a splint to keep your wrist from bending, wear it as directed. It is important to wear the splint at night. Wear the splint for as long as you have pain or numbness in your hand, arm, or wrist. This may take 1 to 2 months.  Rest your wrist from any activity that may be causing your pain. If your symptoms are work-related, you may need to talk to your employer about changing to a job that does not require using your wrist.  Put ice on your wrist after long periods of wrist activity.  Put ice in a plastic bag.  Place a towel between your skin and the bag.  Leave the ice on for 15-20 minutes, 03-04 times a day.  Keep all follow-up visits as directed by your caregiver. This includes any orthopedic referrals, physical therapy, and rehabilitation. Any delay in getting necessary care could result in a delay or failure of your condition to heal. SEEK IMMEDIATE MEDICAL CARE IF:   You have new, unexplained symptoms.  Your symptoms get worse and are not helped or controlled with medicines. MAKE SURE YOU:   Understand these instructions.  Will watch your condition.  Will get  help right away if you are not doing well or get worse. Document Released: 04/10/2000 Document Revised: 07/06/2011 Document Reviewed: 02/27/2011 [facility] Patient Information 2014 [place], [place].  De Quervain's Tenosynovitis De Quervain's tenosynovitis involves inflammation of one or two tendon linings (sheaths) or strain of one or two tendons to the thumb: extensor pollicis  brevis (EPB), or abductor pollicis longus (APL). This causes pain on the side of the wrist and base of the thumb. Tendon sheaths secrete a fluid that lubricates the tendon, allowing the tendon to move smoothly. When the sheath becomes inflamed, the tendon cannot move freely in the sheath. Both the EPB and APL tendons are important for proper use of the hand. The EPB tendon is important for straightening the thumb. The APL tendon is important for moving the thumb away from the index finger (abducting). The two tendons pass through a small tube (canal) in the wrist, near the base of the thumb. When the tendons become inflamed, pain is usually felt in this area. SYMPTOMS   Pain, tenderness, swelling, warmth, or redness over the base of the thumb and thumb side of the wrist.  Pain that gets worse when straightening the thumb.  Pain that gets worse when moving the thumb away from the index finger, against resistance.  Pain with pinching or gripping.  Locking or catching of the thumb.  Limited motion of the thumb.  Crackling sound (crepitation) when the tendon or thumb is moved or touched.  Fluid-filled cyst in the area of the base of the thumb. CAUSES   Tenosynovitis is often linked with overuse of the wrist.  Tenosynovitis may be caused by repeated injury to the thumb muscle and tendon units, and with repeated motions of the hand and wrist, due to friction of the tendon within the lining (sheath).  Tenosynovitis may also be due to a sudden increase in activity or change in activity. RISK INCREASES WITH:  Sports that  involve repeated hand and wrist motions (golf, bowling, tennis, squash, racquetball).  Heavy labor.  Poor physical wrist strength and flexibility.  Failure to warm up properly before practice or play.  Female gender.  New mothers who hold their baby's head for long periods or lift infants with thumbs in the infant's armpit (axilla). PREVENTION  Warm up and stretch properly before practice or competition.  Allow enough time for rest and recovery between practices and competition.  Maintain appropriate conditioning:  Cardiovascular fitness.  Forearm, wrist, and hand flexibility.  Muscle strength and endurance.  Use proper exercise technique. PROGNOSIS  This condition is usually curable within 6 weeks, if treated properly with non-surgical treatment and resting of the affected area.  RELATED COMPLICATIONS   Longer healing time if not properly treated or if not given enough time to heal.  Chronic inflammation, causing recurring symptoms of tenosynovitis. Permanent pain or restriction of movement.  Risks of surgery: infection, bleeding, injury to nerves (numbness of the thumb), continued pain, incomplete release of the tendon sheath, recurring symptoms, cutting of the tendons, tendons sliding out of position, weakness of the thumb, thumb stiffness. TREATMENT  First, treatment involves the use of medicine and ice, to reduce pain and inflammation. Patients are encouraged to stop or modify activities that aggravate the injury. Stretching and strengthening exercises may be advised. Exercises may be completed at home or with a therapist. You may be fitted with a brace or splint, to limit motion and allow the injury to heal. Your caregiver may also choose to give you a corticosteroid injection, to reduce the pain and inflammation. If non-surgical treatment is not successful, surgery may be needed. Most tenosynovitis surgeries are done as outpatient procedures (you go home the same day).  Surgery may involve local, regional (whole arm), or general anesthesia.  MEDICATION   If pain medicine is needed, nonsteroidal anti-inflammatory medicines (aspirin and  ibuprofen), or other minor pain relievers (acetaminophen), are often advised.  Do not take pain medicine for 7 days before surgery.  Prescription pain relievers are often prescribed only after surgery. Use only as directed and only as much as you need.  Corticosteroid injections may be given if your caregiver thinks they are needed. There is a limited number of times these injections may be given. COLD THERAPY   Cold treatment (icing) should be applied for 10 to 15 minutes every 2 to 3 hours for inflammation and pain, and immediately after activity that aggravates your symptoms. Use ice packs or an ice massage. SEEK MEDICAL CARE IF:   Symptoms get worse or do not improve in 2 to 4 weeks, despite treatment.  You experience pain, numbness, or coldness in the hand.  Blue, gray, or dark color appears in the fingernails.  Any of the following occur after surgery: increased pain, swelling, redness, drainage of fluids, bleeding in the affected area, or signs of infection.  New, unexplained symptoms develop. (Drugs used in treatment may produce side effects.) Document Released: 04/13/2005 Document Revised: 07/06/2011 Document Reviewed: 07/26/2008 [facility] Patient Information 2014 [place], [place].      I personally performed the services described in this documentation, which was scribed in my presence. The recorded information has been reviewed and is accurate.

## 2013-10-08 ENCOUNTER — Ambulatory Visit (HOSPITAL_COMMUNITY)
Admission: RE | Admit: 2013-10-08 | Discharge: 2013-10-08 | Disposition: A | Discharge status: Home / Self Care | Payer: 59 | Source: Ambulatory Visit | Attending: Family Medicine | Admitting: Family Medicine

## 2013-10-08 ENCOUNTER — Ambulatory Visit (INDEPENDENT_AMBULATORY_CARE_PROVIDER_SITE_OTHER): Payer: 59 | Admitting: Family Medicine

## 2013-10-08 ENCOUNTER — Ambulatory Visit (INDEPENDENT_AMBULATORY_CARE_PROVIDER_SITE_OTHER): Payer: 59

## 2013-10-08 VITALS — BP 126/80 | HR 67 | Temp 98.0°F | Resp 16 | Ht 64.75 in | Wt 195.8 lb

## 2013-10-08 DIAGNOSIS — Z8719 Personal history of other diseases of the digestive system: Secondary | ICD-10-CM

## 2013-10-08 DIAGNOSIS — R110 Nausea: Secondary | ICD-10-CM

## 2013-10-08 DIAGNOSIS — Z87898 Personal history of other specified conditions: Secondary | ICD-10-CM

## 2013-10-08 DIAGNOSIS — R1013 Epigastric pain: Secondary | ICD-10-CM

## 2013-10-08 LAB — POCT CBC
Granulocyte percent: 51.5 % (ref 37–80)
HCT, POC: 41.7 % (ref 37.7–47.9)
Hemoglobin: 13.7 g/dL (ref 12.2–16.2)
Lymph, poc: 3.5 — AB (ref 0.6–3.4)
MCH, POC: 30.1 pg (ref 27–31.2)
MCHC: 32.9 g/dL (ref 31.8–35.4)
MCV: 91.7 fL (ref 80–97)
MID (cbc): 0.6 (ref 0–0.9)
MPV: 9.4 fL (ref 0–99.8)
POC Granulocyte: 4.3 (ref 2–6.9)
POC LYMPH PERCENT: 41.6 % (ref 10–50)
POC MID %: 6.9 %M (ref 0–12)
Platelet Count, POC: 343 10*3/uL (ref 142–424)
RBC: 4.55 M/uL (ref 4.04–5.48)
RDW, POC: 14.2 %
WBC: 8.4 10*3/uL (ref 4.6–10.2)

## 2013-10-08 LAB — POCT URINALYSIS DIPSTICK
Bilirubin, UA: NEGATIVE
Blood, UA: NEGATIVE
Glucose, UA: NEGATIVE
Ketones, UA: NEGATIVE
Leukocytes, UA: NEGATIVE
Nitrite, UA: NEGATIVE
Protein, UA: NEGATIVE
Spec Grav, UA: 1.015
Urobilinogen, UA: 0.2
pH, UA: 7.5

## 2013-10-08 LAB — POCT UA - MICROSCOPIC ONLY
Bacteria, U Microscopic: NEGATIVE
Casts, Ur, LPF, POC: NEGATIVE
Crystals, Ur, HPF, POC: NEGATIVE
Mucus, UA: NEGATIVE
Yeast, UA: NEGATIVE

## 2013-10-08 MED ORDER — ONDANSETRON 4 MG PO TBDP
4.0000 mg | ORAL_TABLET | Freq: Once | ORAL | Status: AC
Start: 2013-10-08 — End: 2013-10-08
  Administered 2013-10-08: 4 mg via ORAL

## 2013-10-08 MED ORDER — ACETAMINOPHEN-CODEINE 120-12 MG/5ML PO SOLN
5.0000 mL | Freq: Once | ORAL | Status: AC
  Administered 2013-10-08: 5 mL via ORAL

## 2013-10-08 MED ORDER — ONDANSETRON 4 MG PO TBDP: 4.0000 mg | ORAL_TABLET | Freq: Three times a day (TID) | ORAL | Status: DC | PRN

## 2013-10-08 MED ORDER — OXYCODONE-ACETAMINOPHEN 5-325 MG PO TABS: 1.0000 | ORAL_TABLET | Freq: Three times a day (TID) | ORAL | Status: DC | PRN

## 2013-10-08 NOTE — Patient Instructions (Signed)
Abdominal Pain, Adult °Many things can cause abdominal pain. Usually, abdominal pain is not caused by a disease and will improve without treatment. It can often be observed and treated at home. Your health care provider will do a physical exam and possibly order blood tests and X-rays to help determine the seriousness of your pain. However, in many cases, more time must pass before a clear cause of the pain can be found. Before that point, your health care provider may not know if you need more testing or further treatment. °HOME CARE INSTRUCTIONS  °Monitor your abdominal pain for any changes. The following actions may help to alleviate any discomfort you are experiencing: °· Only take over-the-counter or prescription medicines as directed by your health care provider. °· Do not take laxatives unless directed to do so by your health care provider. °· Try a clear liquid diet (broth, tea, or water) as directed by your health care provider. Slowly move to a bland diet as tolerated. °SEEK MEDICAL CARE IF: °· You have unexplained abdominal pain. °· You have abdominal pain associated with nausea or diarrhea. °· You have pain when you urinate or have a bowel movement. °· You experience abdominal pain that wakes you in the night. °· You have abdominal pain that is worsened or improved by eating food. °· You have abdominal pain that is worsened with eating fatty foods. °SEEK IMMEDIATE MEDICAL CARE IF:  °· Your pain does not go away within 2 hours. °· You have a fever. °· You keep throwing up (vomiting). °· Your pain is felt only in portions of the abdomen, such as the right side or the left lower portion of the abdomen. °· You pass bloody or black tarry stools. °MAKE SURE YOU: °· Understand these instructions.   °· Will watch your condition.   °· Will get help right away if you are not doing well or get worse.   °Document Released: 01/21/2005 Document Revised: 02/01/2013 Document Reviewed: 12/21/2012 °[employer]® Patient  Information ©2014 [employer]. ° °

## 2013-10-08 NOTE — Progress Notes (Signed)
 Chief Complaint:  Chief Complaint  Patient presents with  . Nausea    abd pain that started this morning at 8 am.  has taken tylenol today and this is not helping.  was told before that she has an ulcer.     HPI: Kendra Gonzalez is a 43 y.o. female who is here for  10/10 sharp pain that is intermittent since this morning, pain woke her up. She has had stomach ulcer in the past and feels like it but the ulcer never intermittent. She has had nausea, has not thrown up Pain does not feel like it is moving  She has had pancreatitis from ERCP, no prior hx except one time She has not had any diarrhea, just loose stool this morning.  No vaginal discahrge or sxs and no urinary sxs.  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   Past Medical History  Diagnosis Date  . Endometriosis   . Hyperlipidemia   . Kidney stones   . Asthma   . Allergy   . Pancreatitis    Past Surgical History  Procedure Laterality Date  . Cholecystectomy    . Abdominal hysterectomy    . Oophrectomy  2011    secondary to cysts  . Carpel tunnel    . Arthroscopic knee surgery     . Hip surgery    . Ercp  01/08/2012    Procedure: ENDOSCOPIC RETROGRADE CHOLANGIOPANCREATOGRAPHY (ERCP);  Surgeon: Beryle Beams, MD;  Location: [facility] ENDOSCOPY;  Service: Endoscopy;  Laterality: N/A;  . Tubal ligation     History   Social History  . Marital Status: Married    Spouse Name: N/A    Number of Children: N/A  . Years of Education: N/A   Social History Main Topics  . Smoking status: Never Smoker   . Smokeless tobacco: Never Used  . Alcohol Use: No  . Drug Use: No  . Sexual Activity: Yes    Birth Control/ Protection: None   Other Topics Concern  . 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   Family History  Problem Relation Age of  Onset  . Heart disease Daughter   . Hypertension Father   . Hypertension Sister    No Known Allergies Prior to Admission medications   Medication Sig Start Date End Date Taking? Authorizing Provider  albuterol (PROVENTIL HFA;VENTOLIN HFA) 108 (90 BASE) MCG/ACT inhaler Inhale 2 puffs into the lungs every 6 (six) hours as needed for wheezing or shortness of breath.    Yes Historical Provider, MD  AMBULATORY NON FORMULARY MEDICATION Place 1 capsule vaginally as directed. Boric Acid - 1 PV qhs every other night for 4 doses - 04/06/13  Yes Mancel Bale, PA-C  Fluticasone-Salmeterol (ADVAIR) 250-50 MCG/DOSE AEPB Inhale 2 puffs into the lungs every 12 (twelve) hours.   Yes Historical Provider, MD  levocetirizine (XYZAL) 5 MG tablet Take 5 mg by mouth every evening.   Yes Historical Provider, MD  magnesium gluconate (MAGONATE) 500 MG tablet Take 500 mg by mouth at bedtime.   Yes Historical Provider, MD  pantoprazole (PROTONIX) 40 MG tablet Take 40 mg by mouth daily.   Yes Historical Provider, MD     ROS: The patient denies fevers, chills, night sweats,  unintentional weight loss, chest pain, palpitations, wheezing, dyspnea on exertion, , vomiting, dysuria, hematuria, melena, numbness, weakness, or tingling.   All other systems have been reviewed and were otherwise negative with the exception of those mentioned in the HPI and as above.    PHYSICAL EXAM: Filed Vitals:   10/08/13 1456  BP: 126/80  Pulse: 67  Temp: 98 F (36.7 C)  Resp: 16   Filed Vitals:   10/08/13 1456  Height: 5' 4.75" (1.645 m)  Weight: 195 lb 12.8 oz (88.814 kg)   Body mass index is 32.82 kg/(m^2).  General: Alert, + moderate acute distress HEENT:  Normocephalic, atraumatic, oropharynx patent. EOMI, PERRLA Cardiovascular:  Regular rate and rhythm, no rubs murmurs or gallops.  No Carotid bruits, radial pulse intact. No pedal edema.  Respiratory: Clear to auscultation bilaterally.  No wheezes, rales, or rhonchi.  No  cyanosis, no use of accessory musculature GI: No organomegaly, abdomen is soft and + mid epigastric tender, positive bowel sounds.  No masses. No peritoneal signs Skin: No rashes. Neurologic: Facial musculature symmetric. Psychiatric: Patient is appropriate throughout our interaction. Lymphatic: No cervical lymphadenopathy Musculoskeletal: Gait intact.   LABS: Results for orders placed in visit on 10/08/13  POCT CBC      Result Value Ref Range   WBC 8.4  4.6 - 10.2 K/uL   Lymph, poc 3.5 (*) 0.6 - 3.4   POC LYMPH PERCENT 41.6  10 - 50 %L   MID (cbc) 0.6  0 - 0.9   POC MID % 6.9  0 - 12 %M   POC Granulocyte 4.3  2 - 6.9   Granulocyte percent 51.5  37 - 80 %G   RBC 4.55  4.04 - 5.48 M/uL   Hemoglobin 13.7  12.2 - 16.2 g/dL   HCT, POC 41.7  37.7 - 47.9 %   MCV 91.7  80 - 97 fL   MCH, POC 30.1  27 - 31.2 pg   MCHC 32.9  31.8 - 35.4 g/dL   RDW, POC 14.2     Platelet Count, POC 343  142 - 424 K/uL   MPV 9.4  0 - 99.8 fL  POCT UA - MICROSCOPIC ONLY      Result Value Ref Range   WBC, Ur, HPF, POC 1-2     RBC, urine, microscopic 1-2     Bacteria, U Microscopic neg     Mucus, UA neg     Epithelial cells, urine per micros 1-2     Crystals, Ur, HPF, POC neg     Casts, Ur, LPF, POC neg     Yeast, UA neg    POCT URINALYSIS DIPSTICK      Result Value Ref Range   Color, UA yellow     Clarity, UA slightly cloudy     Glucose, UA neg     Bilirubin, UA neg     Ketones, UA neg     Spec Grav, UA 1.015     Blood, UA neg     pH, UA 7.5     Protein, UA neg     Urobilinogen, UA 0.2     Nitrite, UA neg     Leukocytes, UA Negative       EKG/XRAY:   Primary read interpreted by Dr. Marin Comment at [facility]. Non obstructive, non specific gas patterns No obvious kidney stones or free air in epigastric area She has ? Stone on left side   ASSESSMENT/PLAN: Encounter Diagnoses  Name Primary?  Marland Kitchen  Abdominal pain, epigastric Yes  . Nausea alone   . History of pancreatitis   . History of ulcer disease      43 y/o female with 10/10 writhing epigastric abd pain with prior history of  Pancreatitis and ulcers  She is having 10/10 pain, she has not been able to eat; She would like a CT scan , we went through risks and benefits ( pre-auth declined, advise to return in AM for recheck to see if absolutely needed)  She received tylonel with codeine in office with some releif.  Rx Zofran and also Vicodin given, push fluids Precautions given to go to ER, currently no leukocytosis F/u in 1 day  Gross sideeffects, risk and benefits, and alternatives of medications d/w patient. Patient is aware that all medications have potential sideeffects and we are unable to predict every sideeffect or drug-drug interaction that may occur.  , [place], DO 10/08/2013 5:04 PM

## 2013-10-09 ENCOUNTER — Ambulatory Visit (INDEPENDENT_AMBULATORY_CARE_PROVIDER_SITE_OTHER): Payer: 59 | Admitting: Family Medicine

## 2013-10-09 VITALS — BP 110/78 | HR 60 | Temp 98.7°F | Resp 16 | Ht 64.75 in | Wt 196.2 lb

## 2013-10-09 DIAGNOSIS — R1013 Epigastric pain: Secondary | ICD-10-CM

## 2013-10-09 DIAGNOSIS — N200 Calculus of kidney: Secondary | ICD-10-CM

## 2013-10-09 DIAGNOSIS — R112 Nausea with vomiting, unspecified: Secondary | ICD-10-CM

## 2013-10-09 LAB — COMPLETE METABOLIC PANEL WITHOUT GFR
AST: 19 U/L (ref 0–37)
Albumin: 4.4 g/dL (ref 3.5–5.2)
Alkaline Phosphatase: 69 U/L (ref 39–117)
Potassium: 4.5 meq/L (ref 3.5–5.3)
Sodium: 140 meq/L (ref 135–145)
Total Protein: 7.6 g/dL (ref 6.0–8.3)

## 2013-10-09 LAB — COMPLETE METABOLIC PANEL WITH GFR
ALT: 12 U/L (ref 0–35)
BUN: 14 mg/dL (ref 6–23)
CO2: 27 mEq/L (ref 19–32)
Calcium: 10.1 mg/dL (ref 8.4–10.5)
Chloride: 105 mEq/L (ref 96–112)
Creat: 1.01 mg/dL (ref 0.50–1.10)
GFR, Est African American: 78 mL/min
GFR, Est Non African American: 68 mL/min
Glucose, Bld: 91 mg/dL (ref 70–99)
Total Bilirubin: 0.6 mg/dL (ref 0.2–1.2)

## 2013-10-09 LAB — LIPASE: Lipase: 18 U/L (ref 0–75)

## 2013-10-09 NOTE — Progress Notes (Signed)
Chief Complaint:  Chief Complaint  Patient presents with  . Follow-up    with Dr. Marin Comment    HPI: Kendra Gonzalez is a 43 y.o. female who is here for  Recheck for her abd pain She is slightly better, able to tolerate food and fluids after taking nausea meds and vicodin Taking her protonix BID Still has 5-6/10 pain Lipase normal, CMP normal, xray results below but she is not having any bilateral side pain, it is all midepigastric.   IMPRESSION:  Suspect bilateral nephrolithiasis.  No evidence of bowel obstruction or free air.  No acute cardiopulmonary disease.  Electronically Signed  By: Rolm Baptise M.D.  On: 10/08/2013 17:33    Past Medical History  Diagnosis Date  . Endometriosis   . Hyperlipidemia   . Kidney stones   . Asthma   . Allergy   . Pancreatitis    Past Surgical History  Procedure Laterality Date  . Cholecystectomy    . Abdominal hysterectomy    . Oophrectomy  2011    secondary to cysts  . Carpel tunnel    . Arthroscopic knee surgery     . Hip surgery    . Ercp  01/08/2012    Procedure: ENDOSCOPIC RETROGRADE CHOLANGIOPANCREATOGRAPHY (ERCP);  Surgeon: Beryle Beams, MD;  Location: [facility] ENDOSCOPY;  Service: Endoscopy;  Laterality: N/A;  . Tubal ligation     History   Social History  . Marital Status: Married    Spouse Name: N/A    Number of Children: N/A  . Years of Education: N/A   Social History Main Topics  . Smoking status: Never Smoker   . Smokeless tobacco: Never Used  . Alcohol Use: No  . Drug Use: No  . Sexual Activity: Yes    Birth Control/ Protection: None   Other Topics Concern  . None   Social History Narrative   Works as a Education officer, environmental.  Married with 53 child, 60 year old son.   Family History  Problem Relation Age of Onset  . Heart disease Daughter   . Hypertension Father   . Hypertension Sister    No Known Allergies Prior to Admission medications   Medication Sig Start Date End Date Taking? Authorizing  Provider  albuterol (PROVENTIL HFA;VENTOLIN HFA) 108 (90 BASE) MCG/ACT inhaler Inhale 2 puffs into the lungs every 6 (six) hours as needed for wheezing or shortness of breath.    Yes Historical Provider, MD  AMBULATORY NON FORMULARY MEDICATION Place 1 capsule vaginally as directed. Boric Acid - 1 PV qhs every other night for 4 doses - 04/06/13  Yes Mancel Bale, PA-C  Fluticasone-Salmeterol (ADVAIR) 250-50 MCG/DOSE AEPB Inhale 2 puffs into the lungs every 12 (twelve) hours.   Yes Historical Provider, MD  levocetirizine (XYZAL) 5 MG tablet Take 5 mg by mouth every evening.   Yes Historical Provider, MD  magnesium gluconate (MAGONATE) 500 MG tablet Take 500 mg by mouth at bedtime.   Yes Historical Provider, MD  ondansetron (ZOFRAN ODT) 4 MG disintegrating tablet Take 1 tablet (4 mg total) by mouth every 8 (eight) hours as needed for nausea or vomiting. 10/08/13  Yes Thao P Le, DO  oxyCODONE-acetaminophen (ROXICET) 5-325 MG per tablet Take 1 tablet by mouth every 8 (eight) hours as needed for severe pain. 10/08/13  Yes Thao P Le, DO  pantoprazole (PROTONIX) 40 MG tablet Take 40 mg by mouth daily.   Yes Historical Provider, MD  ROS: The patient denies fevers, chills, night sweats, unintentional weight loss, chest pain, palpitations, wheezing, dyspnea on exertion,  dysuria, hematuria, melena, numbness, weakness, or tingling.  All other systems have been reviewed and were otherwise negative with the exception of those mentioned in the HPI and as above.    PHYSICAL EXAM: Filed Vitals:   10/09/13 0846  BP: 110/78  Pulse: 60  Temp: 98.7 F (37.1 C)  Resp: 16   Filed Vitals:   10/09/13 0846  Height: 5' 4.75" (1.645 m)  Weight: 196 lb 3.2 oz (88.996 kg)   Body mass index is 32.89 kg/(m^2).  General: Alert, no acute distress HEENT:  Normocephalic, atraumatic, oropharynx patent. EOMI, PERRLA Cardiovascular:  Regular rate and rhythm, no rubs murmurs or gallops.  No Carotid bruits, radial pulse  intact. No pedal edema.  Respiratory: Clear to auscultation bilaterally.  No wheezes, rales, or rhonchi.  No cyanosis, no use of accessory musculature GI: No organomegaly, abdomen is soft and  + mid epigastric tenderness, positive bowel sounds.  No masses. Skin: No rashes. Neurologic: Facial musculature symmetric. Psychiatric: Patient is appropriate throughout our interaction. Lymphatic: No cervical lymphadenopathy Musculoskeletal: Gait intact.   LABS:    EKG/XRAY:   Primary read interpreted by Dr. Marin Comment at [facility].   ASSESSMENT/PLAN: Encounter Diagnoses  Name Primary?  . Midepigastric pain Yes  . Abdominal pain, epigastric   . Nausea with vomiting   . Renal stones    Awaiting for rest of labs specifically H pylori, suspect this is H pylori, she is feeling better today overall Will treat accordingly if +  Currently CMP and Lipase are WNL She does not have renal stones, although , it is evident on xray.   Gross sideeffects, risk and benefits, and alternatives of medications d/w patient. Patient is aware that all medications have potential sideeffects and we are unable to predict every sideeffect or drug-drug interaction that may occur.  LE, [place], DO 10/11/2013 11:15 AM

## 2013-10-10 LAB — HELICOBACTER PYLORI  ANTIBODY, IGM: Helicobacter pylori, IgM: 13.2 U/mL — ABNORMAL HIGH (ref <9.0)

## 2013-10-11 ENCOUNTER — Telehealth: Payer: Self-pay | Admitting: Family Medicine

## 2013-10-11 DIAGNOSIS — K279 Peptic ulcer, site unspecified, unspecified as acute or chronic, without hemorrhage or perforation: Principal | ICD-10-CM

## 2013-10-11 DIAGNOSIS — B9681 Helicobacter pylori [H. pylori] as the cause of diseases classified elsewhere: Secondary | ICD-10-CM

## 2013-10-11 MED ORDER — AMOXICILLIN 500 MG PO CAPS: ORAL_CAPSULE | ORAL | Status: DC

## 2013-10-11 MED ORDER — CLARITHROMYCIN 500 MG PO TABS: 500.0000 mg | ORAL_TABLET | Freq: Two times a day (BID) | ORAL | Status: DC

## 2013-10-11 MED ORDER — PANTOPRAZOLE SODIUM 40 MG PO TBEC: 40.0000 mg | DELAYED_RELEASE_TABLET | Freq: Two times a day (BID) | ORAL | Status: DC

## 2013-10-11 NOTE — Telephone Encounter (Signed)
Spoke to patient about labs, she is doing ok Has +  H pylori IgM

## 2013-12-30 ENCOUNTER — Ambulatory Visit (INDEPENDENT_AMBULATORY_CARE_PROVIDER_SITE_OTHER): Payer: 59 | Admitting: Emergency Medicine

## 2013-12-30 ENCOUNTER — Ambulatory Visit (INDEPENDENT_AMBULATORY_CARE_PROVIDER_SITE_OTHER): Payer: 59

## 2013-12-30 VITALS — BP 110/70 | HR 80 | Temp 98.2°F | Resp 16 | Ht 65.0 in | Wt 196.0 lb

## 2013-12-30 DIAGNOSIS — M79609 Pain in unspecified limb: Secondary | ICD-10-CM

## 2013-12-30 DIAGNOSIS — M79672 Pain in left foot: Secondary | ICD-10-CM

## 2013-12-30 NOTE — Progress Notes (Signed)
Subjective:    Patient ID: Kendra Gonzalez, female    DOB: 19-Aug-1970, 43 y.o.   MRN: [id number]  This chart was scribed for Arlyss Queen, MD by Erling Conte, Medical Scribe. This patient was seen in Room 11 and the patient's care was started at 8:29 AM.  Chief Complaint  Patient presents with  . Foot Pain    Left foot x 1 week      HPI HPI Comments: Kendra Gonzalez is a 43 y.o. female who presents to the [facility] complaining of moderate left foot pain for 1 week. She admits that the pain is mostly localized in her left heel. She denies any injury to the area. She states she has been exercising a lot lately but this is not new a exercise regime. She has been exercising every day with alternating walking and running. Pt admits that the pain is worse in the morning when she wakes up and she can barely walk on it. The pain is exacerbated by putting any pressure on the left foot. She denies any swelling, weakness, or numbness to the area.  Patient Active Problem List   Diagnosis Date Noted  . Abdominal pain, chronic, right upper quadrant 06/23/2012  . Obesity (BMI 30.0-34.9) 10/12/2011  . CANDIDIASIS, VULVA/VAGINA 02/16/2007  . HYPERLIPIDEMIA 02/16/2007  . ALLERGIC RHINITIS 02/16/2007  . GERD 02/16/2007  . GLUCOSE INTOLERANCE, HX OF 02/16/2007   Past Medical History  Diagnosis Date  . Endometriosis   . Hyperlipidemia   . Kidney stones   . Asthma   . Allergy   . Pancreatitis    Past Surgical History  Procedure Laterality Date  . Cholecystectomy    . Abdominal hysterectomy    . Oophrectomy  2011    secondary to cysts  . Carpel tunnel    . Arthroscopic knee surgery     . Hip surgery    . Ercp  01/08/2012    Procedure: ENDOSCOPIC RETROGRADE CHOLANGIOPANCREATOGRAPHY (ERCP);  Surgeon: Beryle Beams, MD;  Location: [facility] ENDOSCOPY;  Service: Endoscopy;  Laterality: N/A;  . Tubal ligation     No Known Allergies Prior to Admission medications     Medication Sig Start Date End Date Taking? Authorizing Provider  albuterol (PROVENTIL HFA;VENTOLIN HFA) 108 (90 BASE) MCG/ACT inhaler Inhale 2 puffs into the lungs every 6 (six) hours as needed for wheezing or shortness of breath.    Yes Historical Provider, MD  AMBULATORY NON FORMULARY MEDICATION Place 1 capsule vaginally as directed. Boric Acid - 1 PV qhs every other night for 4 doses - 04/06/13  Yes Mancel Bale, PA-C  Fluticasone-Salmeterol (ADVAIR) 250-50 MCG/DOSE AEPB Inhale 2 puffs into the lungs every 12 (twelve) hours.   Yes Historical Provider, MD  levocetirizine (XYZAL) 5 MG tablet Take 5 mg by mouth every evening.   Yes Historical Provider, MD  magnesium gluconate (MAGONATE) 500 MG tablet Take 500 mg by mouth at bedtime.   Yes Historical Provider, MD  pantoprazole (PROTONIX) 40 MG tablet Take 1 tablet (40 mg total) by mouth 2 (two) times daily. 10/11/13  Yes Thao P Le, DO  oxyCODONE-acetaminophen (ROXICET) 5-325 MG per tablet Take 1 tablet by mouth every 8 (eight) hours as needed for severe pain. 10/08/13   Thao P Le, DO   History   Social History  . Marital Status: Married    Spouse Name: N/A    Number of Children: N/A  . Years of Education: N/A   Occupational  History  . Not on file.   Social History Main Topics  . Smoking status: Never Smoker   . Smokeless tobacco: Never Used  . Alcohol Use: No  . Drug Use: No  . Sexual Activity: Yes    Birth Control/ Protection: None   Other Topics Concern  . 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       Review of Systems  Musculoskeletal: Positive for arthralgias (left foot pain) and gait problem (in the morning). Negative for joint swelling.  Neurological: Negative for weakness and numbness.       Objective:   Physical Exam CONSTITUTIONAL: Well developed/well nourished HEAD: Normocephalic/atraumatic EYES: EOMI/PERRL ENMT: Mucous membranes  moist NECK: supple no meningeal signs SPINE:entire spine nontender CV: S1/S2 noted, no murmurs/rubs/gallops noted LUNGS: Lungs are clear to auscultation bilaterally, no apparent distress ABDOMEN: soft, nontender, no rebound or guarding GU:no cva tenderness NEURO: Pt is awake/alert, moves all extremitiesx4 EXTREMITIES: pulses normal, full ROM. Right foot: normal exam no areas of tenderness or swelling.  Left foot: tenderness over the heel, no tenderness over foot or ankle. No swelling noted SKIN: warm, color normal PSYCH: no abnormalities of mood noted [facility] reading (PRIMARY) by  Dr.Kanai Hilger is an Achilles spur and a plantar fascia spur        Assessment & Plan:  Patient has a plantar spur. She was given instructions regarding this. She will do ice massage and get an insert for her shoe

## 2013-12-30 NOTE — Patient Instructions (Signed)
Heel Spur A heel spur is a hook of bone that can form on the calcaneus (the heel bone and the largest bone of the foot). Heel spurs are often associated with plantar fasciitis and usually come in people who have had the problem for an extended period of time. The cause of the relationship is unknown. The pain associated with them is thought to be caused by an inflammation (soreness and redness) of the plantar fascia rather than the spur itself. The plantar fascia is a thick fibrous like tissue that runs from the calcaneus (heel bone) to the ball of the foot. This strong, tight tissue helps maintain the arch of your foot. It helps distribute the weight across your foot as you walk or run. Stresses placed on the plantar fascia can be tremendous. When it is inflamed normal activities become painful. Pain is worse in the morning after sleeping. After sleeping the plantar fascia is tight. The first movements stretch the fascia and this causes pain. As the tendon loosens, the pain usually gets better. It often returns with too much standing or walking.  About 70% of patients with plantar fasciitis have a heel spur. About half of people without foot pain also have heel spurs. DIAGNOSIS  The diagnosis of a heel spur is made by X-ray. The X-ray shows a hook of bone protruding from the bottom of the calcaneus at the point where the plantar fascia is attached to the heel bone.  TREATMENT  It is necessary to find out what is causing the stretching of the plantar fascia. If the cause is over-pronation (flat feet), orthotics and proper foot ware may help.  Stretching exercises, losing weight, wearing shoes that have a cushioned heel that absorbs shock, and elevating the heel with the use of a heel cradle, heel cup, or orthotics may all help. Heel cradles and heel cups provide extra comfort and cushion to the heel, and reduce the amount of shock to the sore area. AVOIDING THE PAIN OF PLANTAR FASCIITIS AND HEEL  SPURS  Consult a sports medicine professional before beginning a new exercise program.  Walking programs offer a good workout. There is a lower chance of overuse injuries common to the runners. There is less impact and less jarring of the joints.  Begin all new exercise programs slowly. If problems or pains develop, decrease the amount of time or distance until you are at a comfortable level.  Wear good shoes and replace them regularly.  Stretch your foot and the heel cords at the back of the ankle (Achilles tendons) both before and after exercise.  Run or exercise on even surfaces that are not hard. For example, asphalt is better than pavement.  Do not run barefoot on hard surfaces.  If using a treadmill, vary the incline.  Do not continue to workout if you have foot or joint problems. Seek professional help if they do not improve. HOME CARE INSTRUCTIONS   Avoid activities that cause you pain until you recover.  Use ice or cold packs to the problem or painful areas after working out.  Only take over-the-counter or prescription medicines for pain, discomfort, or fever as directed by your caregiver.  Soft shoe inserts or athletic shoes with air or gel sole cushions may be helpful.  If problems continue or become more severe, consult a sports medicine caregiver. Cortisone is a potent anti-inflammatory medication that may be injected into the painful area. You can discuss this treatment with your caregiver. MAKE SURE YOU:  Understand these instructions.  Will watch your condition.  Will get help right away if you are not doing well or get worse. Document Released: 05/20/2005 Document Revised: 07/06/2011 Document Reviewed: 06/14/2013 Exit[employer] Patient Information 2015 [employer]. This information is not intended to replace advice given to you by your health care provider. Make sure you discuss any questions you have with your health care provider.  

## 2014-01-04 ENCOUNTER — Other Ambulatory Visit: Payer: Self-pay

## 2014-01-04 ENCOUNTER — Telehealth: Payer: Self-pay

## 2014-01-04 DIAGNOSIS — Z1231 Encounter for screening mammogram for malignant neoplasm of breast: Secondary | ICD-10-CM

## 2014-01-04 NOTE — Telephone Encounter (Signed)
Copy of message given to Xray. Pt notified will be ready for pick up after 5 pm today.

## 2014-01-04 NOTE — Telephone Encounter (Signed)
Patient had x-rays of her foot on Saturday and she needs a copy of the xrays for her referral.  [phone]

## 2014-01-10 ENCOUNTER — Ambulatory Visit (INDEPENDENT_AMBULATORY_CARE_PROVIDER_SITE_OTHER): Payer: 59 | Admitting: Family Medicine

## 2014-01-10 VITALS — BP 118/74 | HR 77 | Temp 98.1°F | Resp 16 | Ht 66.5 in | Wt 196.0 lb

## 2014-01-10 DIAGNOSIS — E049 Nontoxic goiter, unspecified: Secondary | ICD-10-CM

## 2014-01-10 DIAGNOSIS — E010 Iodine-deficiency related diffuse (endemic) goiter: Secondary | ICD-10-CM

## 2014-01-10 DIAGNOSIS — J069 Acute upper respiratory infection, unspecified: Secondary | ICD-10-CM

## 2014-01-10 MED ORDER — AZITHROMYCIN 250 MG PO TABS: ORAL_TABLET | ORAL | Status: DC

## 2014-01-10 NOTE — Progress Notes (Signed)
SUBJECTIVE:  Kendra Gonzalez is a 43 y.o. female who complains of coryza for 3 days. She denies a history of fatigue and cough and denies a history of asthma. Patient denies smoke cigarettes.   Her dentist said she felt a swollen thyroid and needs to have this checked. Patient has no symptoms of hyperthyroid such as hair loss, cold intolerance, constipation, chronic fatigue, or dry skin. OBJECTIVE: She appears well, vital signs are as noted. Ears normal.  Throat and pharynx normal.  Neck supple. No adenopathy in the neck. Nose is congested. Sinuses non tender. The chest is clear, without wheezes or rales.  Barely palpable thyroid, midline without nodularity.  ASSESSMENT:  viral upper respiratory illness and serous otitis, possible thyromegaly  PLAN: Symptomatic therapy suggested: push fluids, rest and return office visit prn if symptoms persist or worsen. Lack of antibiotic effectiveness discussed with her. Call or return to clinic prn if these symptoms worsen or fail to improve as anticipated. Thyromegaly - Plan: TSH  URI, acute - Plan: azithromycin (ZITHROMAX Z-PAK) 250 MG tablet  Signed, Robyn Haber, MD

## 2014-01-10 NOTE — Patient Instructions (Signed)
Hypothyroidism The thyroid is a large gland located in the lower front of your neck. The thyroid gland helps control metabolism. Metabolism is how your body handles food. It controls metabolism with the hormone thyroxine. When this gland is underactive (hypothyroid), it produces too Williamson hormone.  CAUSES These include:   Absence or destruction of thyroid tissue.  Goiter due to iodine deficiency.  Goiter due to medications.  Congenital defects (since birth).  Problems with the pituitary. This causes a lack of TSH (thyroid stimulating hormone). This hormone tells the thyroid to turn out more hormone. SYMPTOMS  Lethargy (feeling as though you have no energy)  Cold intolerance  Weight gain (in spite of normal food intake)  Dry skin  Coarse hair  Menstrual irregularity (if severe, may lead to infertility)  Slowing of thought processes Cardiac problems are also caused by insufficient amounts of thyroid hormone. Hypothyroidism in the newborn is cretinism, and is an extreme form. It is important that this form be treated adequately and immediately or it will lead rapidly to retarded physical and mental development. DIAGNOSIS  To prove hypothyroidism, your caregiver may do blood tests and ultrasound tests. Sometimes the signs are hidden. It may be necessary for your caregiver to watch this illness with blood tests either before or after diagnosis and treatment. TREATMENT  Low levels of thyroid hormone are increased by using synthetic thyroid hormone. This is a safe, effective treatment. It usually takes about four weeks to gain the full effects of the medication. After you have the full effect of the medication, it will generally take another four weeks for problems to leave. Your caregiver may start you on low doses. If you have had heart problems the dose may be gradually increased. It is generally not an emergency to get rapidly to normal. HOME CARE INSTRUCTIONS   Take your  medications as your caregiver suggests. Let your caregiver know of any medications you are taking or start taking. Your caregiver will help you with dosage schedules.  As your condition improves, your dosage needs may increase. It will be necessary to have continuing blood tests as suggested by your caregiver.  Report all suspected medication side effects to your caregiver. SEEK MEDICAL CARE IF: Seek medical care if you develop:  Sweating.  Tremulousness (tremors).  Anxiety.  Rapid weight loss.  Heat intolerance.  Emotional swings.  Diarrhea.  Weakness. SEEK IMMEDIATE MEDICAL CARE IF:  You develop chest pain, an irregular heart beat (palpitations), or a rapid heart beat. MAKE SURE YOU:   Understand these instructions.  Will watch your condition.  Will get help right away if you are not doing well or get worse. Document Released: 04/13/2005 Document Revised: 07/06/2011 Document Reviewed: 12/02/2007 Exit[employer] Patient Information 2015 [employer]. This information is not intended to replace advice given to you by your health care provider. Make sure you discuss any questions you have with your health care provider.  

## 2014-01-11 LAB — TSH: TSH: 0.282 u[IU]/mL — ABNORMAL LOW (ref 0.350–4.500)

## 2014-02-28 ENCOUNTER — Ambulatory Visit
Admission: RE | Admit: 2014-02-28 | Discharge: 2014-02-28 | Disposition: A | Discharge status: Home / Self Care | Payer: 59 | Source: Ambulatory Visit

## 2014-02-28 DIAGNOSIS — Z1231 Encounter for screening mammogram for malignant neoplasm of breast: Secondary | ICD-10-CM

## 2014-03-09 ENCOUNTER — Encounter: Payer: Self-pay | Admitting: Family Medicine

## 2014-03-09 ENCOUNTER — Telehealth: Payer: Self-pay

## 2014-03-09 ENCOUNTER — Ambulatory Visit (INDEPENDENT_AMBULATORY_CARE_PROVIDER_SITE_OTHER): Payer: 59 | Admitting: Family Medicine

## 2014-03-09 VITALS — BP 100/72 | HR 59 | Temp 97.9°F | Resp 16 | Ht 65.0 in | Wt 193.8 lb

## 2014-03-09 DIAGNOSIS — Z23 Encounter for immunization: Secondary | ICD-10-CM

## 2014-03-09 DIAGNOSIS — K219 Gastro-esophageal reflux disease without esophagitis: Secondary | ICD-10-CM

## 2014-03-09 DIAGNOSIS — E785 Hyperlipidemia, unspecified: Secondary | ICD-10-CM

## 2014-03-09 DIAGNOSIS — N760 Acute vaginitis: Secondary | ICD-10-CM

## 2014-03-09 DIAGNOSIS — Z130 Encounter for screening for diseases of the blood and blood-forming organs and certain disorders involving the immune mechanism: Secondary | ICD-10-CM

## 2014-03-09 DIAGNOSIS — J45909 Unspecified asthma, uncomplicated: Secondary | ICD-10-CM

## 2014-03-09 DIAGNOSIS — Z1329 Encounter for screening for other suspected endocrine disorder: Secondary | ICD-10-CM

## 2014-03-09 DIAGNOSIS — Z0000 Encounter for general adult medical examination without abnormal findings: Secondary | ICD-10-CM

## 2014-03-09 DIAGNOSIS — A499 Bacterial infection, unspecified: Secondary | ICD-10-CM

## 2014-03-09 DIAGNOSIS — B9689 Other specified bacterial agents as the cause of diseases classified elsewhere: Secondary | ICD-10-CM

## 2014-03-09 DIAGNOSIS — N898 Other specified noninflammatory disorders of vagina: Secondary | ICD-10-CM

## 2014-03-09 LAB — COMPLETE METABOLIC PANEL WITHOUT GFR
Albumin: 4.2 g/dL (ref 3.5–5.2)
Alkaline Phosphatase: 70 U/L (ref 39–117)
BUN: 10 mg/dL (ref 6–23)
Creat: 0.94 mg/dL (ref 0.50–1.10)
GFR, Est Non African American: 74 mL/min

## 2014-03-09 LAB — POCT URINALYSIS DIPSTICK
Bilirubin, UA: NEGATIVE
Blood, UA: NEGATIVE
Glucose, UA: NEGATIVE
Ketones, UA: NEGATIVE
Nitrite, UA: NEGATIVE
Protein, UA: NEGATIVE
Spec Grav, UA: 1.015
Urobilinogen, UA: 0.2
pH, UA: 7

## 2014-03-09 LAB — POCT WET PREP WITH KOH
KOH Prep POC: NEGATIVE
Trichomonas, UA: NEGATIVE
WBC Wet Prep HPF POC: NEGATIVE
Yeast Wet Prep HPF POC: NEGATIVE

## 2014-03-09 LAB — COMPLETE METABOLIC PANEL WITH GFR
ALT: 17 U/L (ref 0–35)
AST: 23 U/L (ref 0–37)
CO2: 26 mEq/L (ref 19–32)
Calcium: 9.5 mg/dL (ref 8.4–10.5)
Chloride: 106 mEq/L (ref 96–112)
GFR, Est African American: 85 mL/min
Glucose, Bld: 77 mg/dL (ref 70–99)
Potassium: 4.5 mEq/L (ref 3.5–5.3)
Sodium: 140 mEq/L (ref 135–145)
Total Bilirubin: 0.7 mg/dL (ref 0.2–1.2)
Total Protein: 7.2 g/dL (ref 6.0–8.3)

## 2014-03-09 LAB — CBC
HCT: 40.4 % (ref 36.0–46.0)
Hemoglobin: 13.8 g/dL (ref 12.0–15.0)
MCH: 30.6 pg (ref 26.0–34.0)
MCHC: 34.2 g/dL (ref 30.0–36.0)
MCV: 89.6 fL (ref 78.0–100.0)
Platelets: 277 10*3/uL (ref 150–400)
RBC: 4.51 MIL/uL (ref 3.87–5.11)
RDW: 13.7 % (ref 11.5–15.5)
WBC: 5.2 10*3/uL (ref 4.0–10.5)

## 2014-03-09 LAB — TSH: TSH: 0.64 u[IU]/mL (ref 0.350–4.500)

## 2014-03-09 LAB — LIPID PANEL
Cholesterol: 205 mg/dL — ABNORMAL HIGH (ref 0–200)
HDL: 70 mg/dL (ref >39)
LDL Cholesterol: 125 mg/dL — ABNORMAL HIGH (ref 0–99)
Total CHOL/HDL Ratio: 2.9 Ratio
Triglycerides: 49 mg/dL (ref <150)
VLDL: 10 mg/dL (ref 0–40)

## 2014-03-09 LAB — POCT URINE PREGNANCY: Preg Test, Ur: NEGATIVE

## 2014-03-09 MED ORDER — ALBUTEROL SULFATE HFA 108 (90 BASE) MCG/ACT IN AERS: 2.0000 | INHALATION_SPRAY | Freq: Four times a day (QID) | RESPIRATORY_TRACT | Status: DC | PRN

## 2014-03-09 MED ORDER — PANTOPRAZOLE SODIUM 40 MG PO TBEC: 40.0000 mg | DELAYED_RELEASE_TABLET | Freq: Two times a day (BID) | ORAL | Status: DC

## 2014-03-09 MED ORDER — BORIC ACID POWD: Status: DC

## 2014-03-09 MED ORDER — LEVOCETIRIZINE DIHYDROCHLORIDE 5 MG PO TABS: 5.0000 mg | ORAL_TABLET | Freq: Every evening | ORAL | Status: DC

## 2014-03-09 MED ORDER — FLUTICASONE-SALMETEROL 250-50 MCG/DOSE IN AEPB: 2.0000 | INHALATION_SPRAY | Freq: Two times a day (BID) | RESPIRATORY_TRACT | Status: DC

## 2014-03-09 NOTE — Progress Notes (Signed)
Chief Complaint:  Chief Complaint  Patient presents with  . Annual Exam  . Medication Refill    HPI: Kendra Gonzalez is a 43 y.o. female who is here for  An Annual visit LMP 1998 s/p abd hysterectomy, also has had 1 oophrectomy,  Denies hot flash sxs No family hx of osteoporosis, osteopenia Training with [employer]  3x times a week 4-5 miles S/p hysterectomy due to endometriosis no malignancy Mammogram UTD on 02/28/2014-was normal No family hx of breast, uterine, cervical cance Has vaginal dc, itchining, denies douching, no baths. 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  UTD on tetanus    Past Medical History  Diagnosis Date  . Endometriosis   . Hyperlipidemia   . Kidney stones   . Asthma   . Allergy   . Pancreatitis   . History of tetanus, diphtheria, and acellular pertussis booster vaccination (Tdap)     07/30/2010   Past Surgical History  Procedure Laterality Date  . Cholecystectomy    . Abdominal hysterectomy    . Oophrectomy  2011    secondary to cysts  . Carpel tunnel    . Arthroscopic knee surgery     . Hip surgery    . Ercp  01/08/2012    Procedure: ENDOSCOPIC RETROGRADE CHOLANGIOPANCREATOGRAPHY (ERCP);  Surgeon: Beryle Beams, MD;  Location: [facility] ENDOSCOPY;  Service: Endoscopy;  Laterality: N/A;  . Tubal ligation     History   Social History  . Marital Status: Married    Spouse Name: N/A    Number of Children: N/A  . Years of Education: N/A   Social History Main Topics  . Smoking status: Never Smoker   . Smokeless tobacco: Never Used  . Alcohol Use: No  . Drug Use: No  . Sexual Activity: Yes    Birth Control/ Protection: None   Other Topics Concern  . None   Social History Narrative   Works as a Education officer, environmental.  Married with 33 child, 24 year old son.   Family History  Problem Relation Age of Onset  . Heart disease Daughter   . Hypertension  Father   . Hypertension Sister    No Known Allergies Prior to Admission medications   Medication Sig Start Date End Date Taking? Authorizing Provider  albuterol (PROVENTIL HFA;VENTOLIN HFA) 108 (90 BASE) MCG/ACT inhaler Inhale 2 puffs into the lungs every 6 (six) hours as needed for wheezing or shortness of breath.    Yes Historical Provider, MD  Fluticasone-Salmeterol (ADVAIR) 250-50 MCG/DOSE AEPB Inhale 2 puffs into the lungs every 12 (twelve) hours.   Yes Historical Provider, MD  magnesium gluconate (MAGONATE) 500 MG tablet Take 500 mg by mouth at bedtime.   Yes Historical Provider, MD  pantoprazole (PROTONIX) 40 MG tablet Take 1 tablet (40 mg total) by mouth 2 (two) times daily. 10/11/13  Yes [name] P [name], DO  levocetirizine (XYZAL) 5 MG tablet Take 5 mg by mouth every evening.    Historical Provider, MD     ROS: The patient denies fevers, chills, night sweats, unintentional weight loss, chest pain, palpitations, wheezing, dyspnea on exertion, nausea, vomiting, abdominal pain, dysuria, hematuria, melena, numbness, weakness, or tingling.   All other systems have been reviewed and were otherwise negative with the exception of those mentioned in the HPI and as above.  PHYSICAL EXAM: Filed Vitals:   03/09/14 0815  BP: 100/72  Pulse: 59  Temp: 97.9 F (36.6 C)  Resp: 16   Filed Vitals:   03/09/14 0815  Height: 5\' 5"  (1.651 m)  Weight: 193 lb 12.8 oz (87.907 kg)   Body mass index is 32.25 kg/(m^2).  General: Alert, no acute distress HEENT:  Normocephalic, atraumatic, oropharynx patent. EOMI, PERRLA, tm nl, fundo nl, bilateral neck fullness Cardiovascular:  Regular rate and rhythm, no rubs murmurs or gallops.  No Carotid bruits, radial pulse intact. No pedal edema.  Respiratory: Clear to auscultation bilaterally.  No wheezes, rales, or rhonchi.  No cyanosis, no use of accessory musculature GI: No organomegaly, abdomen is soft and non-tender, positive bowel sounds.  No masses. Skin:  No rashes. Neurologic: Facial musculature symmetric. Psychiatric: Patient is appropriate throughout our interaction. Lymphatic: No cervical lymphadenopathy Musculoskeletal: Gait intact. BReast exam deferred-just had normal mamogram on 02/28/14 GU-no cervix, white dc, no masses or lesions  LABS: Results for orders placed or performed in visit on 03/09/14  COMPLETE METABOLIC PANEL WITH GFR  Result Value Ref Range   Sodium 140 135 - 145 mEq/L   Potassium 4.5 3.5 - 5.3 mEq/L   Chloride 106 96 - 112 mEq/L   CO2 26 19 - 32 mEq/L   Glucose, Bld 77 70 - 99 mg/dL   BUN 10 6 - 23 mg/dL   Creat 0.94 0.50 - 1.10 mg/dL   Total Bilirubin 0.7 0.2 - 1.2 mg/dL   Alkaline Phosphatase 70 39 - 117 U/L   AST 23 0 - 37 U/L   ALT 17 0 - 35 U/L   Total Protein 7.2 6.0 - 8.3 g/dL   Albumin 4.2 3.5 - 5.2 g/dL   Calcium 9.5 8.4 - 10.5 mg/dL   GFR, Est African American 85 mL/min   GFR, Est Non African American 74 mL/min  CBC  Result Value Ref Range   WBC 5.2 4.0 - 10.5 K/uL   RBC 4.51 3.87 - 5.11 MIL/uL   Hemoglobin 13.8 12.0 - 15.0 g/dL   HCT 40.4 36.0 - 46.0 %   MCV 89.6 78.0 - 100.0 fL   MCH 30.6 26.0 - 34.0 pg   MCHC 34.2 30.0 - 36.0 g/dL   RDW 13.7 11.5 - 15.5 %   Platelets 277 150 - 400 K/uL  Lipid panel  Result Value Ref Range   Cholesterol 205 (H) 0 - 200 mg/dL   Triglycerides 49 <150 mg/dL   HDL 70 >39 mg/dL   Total CHOL/HDL Ratio 2.9 Ratio   VLDL 10 0 - 40 mg/dL   LDL Cholesterol 125 (H) 0 - 99 mg/dL  TSH  Result Value Ref Range   TSH 0.640 0.350 - 4.500 uIU/mL  POCT Wet Prep with KOH  Result Value Ref Range   Trichomonas, UA Negative    Clue Cells Wet Prep HPF POC tntc    Epithelial Wet Prep HPF POC tntc    Yeast Wet Prep HPF POC neg    Bacteria Wet Prep HPF POC 5+    RBC Wet Prep HPF POC 0-1    WBC Wet Prep HPF POC neg    KOH Prep POC Negative   POCT urinalysis dipstick  Result Value Ref Range   Color, UA yellow    Clarity, UA clear    Glucose, UA neg    Bilirubin, UA  neg    Ketones, UA neg    Spec Grav, UA 1.015    Blood,  UA neg    pH, UA 7.0    Protein, UA neg    Urobilinogen, UA 0.2    Nitrite, UA neg    Leukocytes, UA Trace   POCT urine pregnancy  Result Value Ref Range   Preg Test, Ur Negative      EKG/XRAY:   Primary read interpreted by Dr. Marin Comment at [facility].   ASSESSMENT/PLAN: Encounter Diagnoses  Name Primary?  . Annual physical exam Yes  . Screening for deficiency anemia   . Screening for thyroid disorder   . Hyperlipidemia   . Vaginal discharge   . Flu vaccine need   . Asthma, chronic, unspecified asthma severity, uncomplicated   . Bacterial vaginosis   . Gastroesophageal reflux disease without esophagitis    Rx Boric acid 600 mg capsules weekly prn for BV , she states this is better fro her than flagyl sicne she gets them all the time Trace leuks in urine is probably a contaminant Refilled meds : albuterol, advair, zyxal, protonix Flu vaccine given UTD on mammogram and  tetanus Labs done today with intention: CBC, CMP Lipid TSH I did not mean to order the pregnancy test ( she has had a hysterectomy--asked billing manager to not bill her for this since my fault, checkboxes too close together!!)  Gross sideeffects, risk and benefits, and alternatives of medications d/w patient. Patient is aware that all medications have potential sideeffects and we are unable to predict every sideeffect or drug-drug interaction that may occur.  Darelle Kings, Goodman, DO 03/09/2014 3:43 PM

## 2014-03-09 NOTE — Telephone Encounter (Signed)
[facility]ladimir Faster called to let us know that they can not make the boric acid suppositories. Called pt and she said she normally gets these at [facility]. Recent to that pharm.

## 2014-03-09 NOTE — Addendum Note (Signed)
Addended by: Constance Goltz on: 03/09/2014 04:00 PM   Modules accepted: Orders

## 2014-03-13 ENCOUNTER — Telehealth: Payer: Self-pay

## 2014-03-13 NOTE — Telephone Encounter (Signed)
PA needed for Advair. Contacted pt and she reported that she has been using this for over 3 years with good effectiveness. She was on a different inhaler before, which she thinks was atrovent, that was not as effective. Completed form and faxed to [facility]. Pending.

## 2014-03-14 MED ORDER — FLUTICASONE-SALMETEROL 250-50 MCG/DOSE IN AEPB
1.0000 | INHALATION_SPRAY | Freq: Two times a day (BID) | RESPIRATORY_TRACT | Status: DC
Start: 2014-03-14 — End: 2014-03-18

## 2014-03-14 NOTE — Telephone Encounter (Signed)
Signed Rx. If her symptoms are not adequately controlled with 1 puff BID, then next step is to change to the 500/50 diskus, 1 puff BID.  Meds ordered this encounter  Medications  . Fluticasone-Salmeterol (ADVAIR) 250-50 MCG/DOSE AEPB    Sig: Inhale 1 puff into the lungs every 12 (twelve) hours.    Dispense:  60 each    Refill:  6

## 2014-03-14 NOTE — Telephone Encounter (Signed)
Got letter from [facility] stating that Advair if covered and it pharm has trouble getting it to go through, they should call pharm help desk. Faxed this letter pharm.

## 2014-03-14 NOTE — Telephone Encounter (Signed)
Pharmacist called back and advised that the problem w/ getting Advair to go through ins is that it requires a quantity override bc it is usually just dosed at 1 puff Q12 hrs, and pt's Rx is for 2 puffs Q12 hrs. OV notes state that Dr Marin Comment is just refilling pt's current meds. Called and LM for pt CB.  Pt CB and reported that Dr Otis Dials had started pt on dose of 2 puffs Q12 hrs bc her asthma gets so bad during pollen season. She never tried 1 puff Q12 hrs before. Pt stated that she is willing to try 1 puff dose and then will call us back if it is not effective at this dose. Can we send in a Rx for the 1 puff Q12 hrs for trial? Pended.

## 2014-03-14 NOTE — Addendum Note (Signed)
Addended by: Elwyn Reach A on: 03/14/2014 01:51 PM   Modules accepted: Orders

## 2014-03-14 NOTE — Addendum Note (Signed)
Addended by: Fara Chute on: 03/14/2014 03:07 PM   Modules accepted: Orders

## 2014-03-17 ENCOUNTER — Telehealth: Payer: Self-pay

## 2014-03-17 NOTE — Telephone Encounter (Signed)
Pt wants the advair HFA instead of the disket. Please return call and advise. CB # [phone]

## 2014-03-18 MED ORDER — FLUTICASONE-SALMETEROL 115-21 MCG/ACT IN AERO: 2.0000 | INHALATION_SPRAY | Freq: Two times a day (BID) | RESPIRATORY_TRACT | Status: DC

## 2014-03-18 MED ORDER — FLUTICASONE-SALMETEROL 230-21 MCG/ACT IN AERO: 2.0000 | INHALATION_SPRAY | Freq: Two times a day (BID) | RESPIRATORY_TRACT | Status: DC

## 2014-03-18 NOTE — Telephone Encounter (Signed)
Please call [facility] (as well as the patient for the message below).  For [facility] tell them I want her to get the Advair HFA 115.

## 2014-03-18 NOTE — Telephone Encounter (Signed)
I have changed her to the [facility] inhaler.  She stills need to be sure to rinse her mouth after use.

## 2014-03-18 NOTE — Telephone Encounter (Signed)
Called [facility] and cancelled the 254mcg and told them to fill the 115. LMOM letting pt know this was sent to the pharm

## 2014-03-19 DIAGNOSIS — G894 Chronic pain syndrome: Secondary | ICD-10-CM | POA: Insufficient documentation

## 2014-03-20 ENCOUNTER — Encounter: Payer: Self-pay | Admitting: Family Medicine

## 2014-04-23 DIAGNOSIS — M79672 Pain in left foot: Secondary | ICD-10-CM

## 2014-04-23 DIAGNOSIS — G8929 Other chronic pain: Secondary | ICD-10-CM | POA: Insufficient documentation

## 2014-04-27 HISTORY — PX: UPPER GASTROINTESTINAL ENDOSCOPY: SHX188

## 2014-05-02 DIAGNOSIS — M1610 Unilateral primary osteoarthritis, unspecified hip: Secondary | ICD-10-CM | POA: Insufficient documentation

## 2014-05-02 DIAGNOSIS — M1612 Unilateral primary osteoarthritis, left hip: Secondary | ICD-10-CM | POA: Insufficient documentation

## 2014-05-04 ENCOUNTER — Encounter (HOSPITAL_COMMUNITY): Payer: Self-pay | Admitting: Emergency Medicine

## 2014-05-04 ENCOUNTER — Emergency Department (HOSPITAL_COMMUNITY)
Admission: EM | Admit: 2014-05-04 | Discharge: 2014-05-04 | Disposition: A | Discharge status: Home / Self Care | Payer: 59 | Attending: Emergency Medicine | Admitting: Emergency Medicine

## 2014-05-04 DIAGNOSIS — Z8639 Personal history of other endocrine, nutritional and metabolic disease: Secondary | ICD-10-CM | POA: Diagnosis not present

## 2014-05-04 DIAGNOSIS — R1013 Epigastric pain: Secondary | ICD-10-CM | POA: Diagnosis present

## 2014-05-04 DIAGNOSIS — Z87442 Personal history of urinary calculi: Secondary | ICD-10-CM | POA: Insufficient documentation

## 2014-05-04 DIAGNOSIS — Z8742 Personal history of other diseases of the female genital tract: Secondary | ICD-10-CM | POA: Diagnosis not present

## 2014-05-04 DIAGNOSIS — J45909 Unspecified asthma, uncomplicated: Secondary | ICD-10-CM | POA: Insufficient documentation

## 2014-05-04 DIAGNOSIS — R112 Nausea with vomiting, unspecified: Secondary | ICD-10-CM | POA: Insufficient documentation

## 2014-05-04 DIAGNOSIS — Z79899 Other long term (current) drug therapy: Secondary | ICD-10-CM | POA: Diagnosis not present

## 2014-05-04 DIAGNOSIS — Z8719 Personal history of other diseases of the digestive system: Secondary | ICD-10-CM | POA: Diagnosis not present

## 2014-05-04 DIAGNOSIS — Z7951 Long term (current) use of inhaled steroids: Secondary | ICD-10-CM | POA: Insufficient documentation

## 2014-05-04 LAB — URINALYSIS, ROUTINE W REFLEX MICROSCOPIC
Bilirubin Urine: NEGATIVE
Glucose, UA: NEGATIVE mg/dL
Hgb urine dipstick: NEGATIVE
Ketones, ur: NEGATIVE mg/dL
Leukocytes, UA: NEGATIVE
NITRITE: NEGATIVE
PROTEIN: NEGATIVE mg/dL
SPECIFIC GRAVITY, URINE: 1.021 (ref 1.005–1.030)
Urobilinogen, UA: 0.2 mg/dL (ref 0.0–1.0)
pH: 6 (ref 5.0–8.0)

## 2014-05-04 LAB — COMPREHENSIVE METABOLIC PANEL
ALT: 27 U/L (ref 0–35)
AST: 29 U/L (ref 0–37)
Albumin: 4.8 g/dL (ref 3.5–5.2)
Alkaline Phosphatase: 70 U/L (ref 39–117)
Anion gap: 9 (ref 5–15)
BILIRUBIN TOTAL: 0.7 mg/dL (ref 0.3–1.2)
BUN: 22 mg/dL (ref 6–23)
CO2: 25 mmol/L (ref 19–32)
CREATININE: 1.05 mg/dL (ref 0.50–1.10)
Calcium: 9.8 mg/dL (ref 8.4–10.5)
Chloride: 103 mEq/L (ref 96–112)
GFR calc Af Amer: 73 mL/min — ABNORMAL LOW (ref >90)
GFR calc non Af Amer: 63 mL/min — ABNORMAL LOW (ref >90)
Glucose, Bld: 122 mg/dL — ABNORMAL HIGH (ref 70–99)
POTASSIUM: 4.9 mmol/L (ref 3.5–5.1)
Sodium: 137 mmol/L (ref 135–145)
Total Protein: 8.1 g/dL (ref 6.0–8.3)

## 2014-05-04 LAB — CBC WITH DIFFERENTIAL/PLATELET
BASOS ABS: 0 10*3/uL (ref 0.0–0.1)
Basophils Relative: 0 % (ref 0–1)
Eosinophils Absolute: 0 10*3/uL (ref 0.0–0.7)
Eosinophils Relative: 0 % (ref 0–5)
HEMATOCRIT: 42 % (ref 36.0–46.0)
Hemoglobin: 14.4 g/dL (ref 12.0–15.0)
LYMPHS ABS: 1.4 10*3/uL (ref 0.7–4.0)
Lymphocytes Relative: 13 % (ref 12–46)
MCH: 31.2 pg (ref 26.0–34.0)
MCHC: 34.3 g/dL (ref 30.0–36.0)
MCV: 90.9 fL (ref 78.0–100.0)
MONOS PCT: 1 % — AB (ref 3–12)
Monocytes Absolute: 0.1 10*3/uL (ref 0.1–1.0)
NEUTROS PCT: 86 % — AB (ref 43–77)
Neutro Abs: 9.3 10*3/uL — ABNORMAL HIGH (ref 1.7–7.7)
Platelets: 319 10*3/uL (ref 150–400)
RBC: 4.62 MIL/uL (ref 3.87–5.11)
RDW: 13.4 % (ref 11.5–15.5)
WBC: 10.7 10*3/uL — AB (ref 4.0–10.5)

## 2014-05-04 LAB — LIPASE, BLOOD: LIPASE: 22 U/L (ref 11–59)

## 2014-05-04 MED ORDER — METOCLOPRAMIDE HCL 5 MG/ML IJ SOLN
10.0000 mg | Freq: Once | INTRAMUSCULAR | Status: AC
  Administered 2014-05-04: 10 mg via INTRAVENOUS
  Filled 2014-05-04: qty 2

## 2014-05-04 MED ORDER — DIPHENHYDRAMINE HCL 50 MG/ML IJ SOLN
25.0000 mg | Freq: Once | INTRAMUSCULAR | Status: AC
  Administered 2014-05-04: 25 mg via INTRAVENOUS
  Filled 2014-05-04: qty 1

## 2014-05-04 MED ORDER — SODIUM CHLORIDE 0.9 % IV SOLN: INTRAVENOUS | Status: DC

## 2014-05-04 MED ORDER — GI COCKTAIL ~~LOC~~
30.0000 mL | Freq: Once | ORAL | Status: AC
  Administered 2014-05-04: 30 mL via ORAL
  Filled 2014-05-04: qty 30

## 2014-05-04 MED ORDER — HYDROMORPHONE HCL 1 MG/ML IJ SOLN
1.0000 mg | Freq: Once | INTRAMUSCULAR | Status: AC
  Administered 2014-05-04: 1 mg via INTRAVENOUS
  Filled 2014-05-04: qty 1

## 2014-05-04 MED ORDER — SUCRALFATE 1 G PO TABS: 1.0000 g | ORAL_TABLET | Freq: Four times a day (QID) | ORAL | Status: DC

## 2014-05-04 MED ORDER — SODIUM CHLORIDE 0.9 % IV BOLUS (SEPSIS)
1000.0000 mL | Freq: Once | INTRAVENOUS | Status: AC
  Administered 2014-05-04: 1000 mL via INTRAVENOUS

## 2014-05-04 MED ORDER — FAMOTIDINE IN NACL 20-0.9 MG/50ML-% IV SOLN
20.0000 mg | Freq: Once | INTRAVENOUS | Status: AC
  Administered 2014-05-04: 20 mg via INTRAVENOUS
  Filled 2014-05-04: qty 50

## 2014-05-04 NOTE — Discharge Instructions (Signed)

## 2014-05-04 NOTE — ED Notes (Addendum)
Patient states that pain comes and goes and is above the umbilicus. Patient has taken oxycodone and tylenol at home with no relief.

## 2014-05-04 NOTE — ED Notes (Signed)
Pt reports central abd pain around belly button for past 3 days. Some nausea, but no v/d. No fevers at home. Pt has pmh of gastric ulcers and pancreatitis.

## 2014-05-04 NOTE — ED Provider Notes (Signed)
CSN: [id number]     Arrival date & time 05/04/14  1443 History   First MD Initiated Contact with Patient 05/04/14 1537     Chief Complaint  Patient presents with  . Abdominal Pain     (Consider location/radiation/quality/duration/timing/severity/associated sxs/prior Treatment) Patient is a 44 y.o. female presenting with abdominal pain. The history is provided by the patient.  Abdominal Pain Pain location:  Epigastric Pain quality: burning   Pain radiates to:  Does not radiate Pain severity:  Severe Onset quality:  Sudden Duration:  3 days Timing:  Constant Progression:  Worsening Chronicity:  Recurrent Context: not alcohol use   Relieved by:  Nothing Worsened by:  Nothing tried Associated symptoms: nausea and vomiting   Associated symptoms: no diarrhea, no dysuria, no fever, no melena, no vaginal bleeding and no vaginal discharge    Pt has h/o PUD and pancreatitis and this is similar--used percocet without relief Past Medical History  Diagnosis Date  . Endometriosis   . Hyperlipidemia   . Kidney stones   . Asthma   . Allergy   . Pancreatitis   . History of tetanus, diphtheria, and acellular pertussis booster vaccination (Tdap)     07/30/2010   Past Surgical History  Procedure Laterality Date  . Cholecystectomy    . Abdominal hysterectomy    . Oophrectomy  2011    secondary to cysts  . Carpel tunnel    . Arthroscopic knee surgery     . Hip surgery    . Ercp  01/08/2012    Procedure: ENDOSCOPIC RETROGRADE CHOLANGIOPANCREATOGRAPHY (ERCP);  Surgeon: Beryle Beams, MD;  Location: [facility] ENDOSCOPY;  Service: Endoscopy;  Laterality: N/A;  . Tubal ligation     Family History  Problem Relation Age of Onset  . Heart disease Daughter   . Hypertension Father   . Hypertension Sister    History  Substance Use Topics  . Smoking status: Never Smoker   . Smokeless tobacco: Never Used  . Alcohol Use: No   OB History    No data available     Review of Systems   Constitutional: Negative for fever.  Gastrointestinal: Positive for nausea, vomiting and abdominal pain. Negative for diarrhea and melena.  Genitourinary: Negative for dysuria, vaginal bleeding and vaginal discharge.  All other systems reviewed and are negative.     Allergies  Review of patient's allergies indicates no known allergies.  Home Medications   Prior to Admission medications   Medication Sig Start Date End Date Taking? Authorizing Provider  albuterol (PROVENTIL HFA;VENTOLIN HFA) 108 (90 BASE) MCG/ACT inhaler Inhale 2 puffs into the lungs every 6 (six) hours as needed for wheezing or shortness of breath. 03/09/14   Thao P Le, DO  Boric Acid POWD Place 600 mg capsule intravaginally once a week: Boric acid suppository 03/09/14   Thao P Le, DO  fluticasone-salmeterol (ADVAIR HFA) 115-21 MCG/ACT inhaler Inhale 2 puffs into the lungs 2 (two) times daily. 03/18/14   Mancel Bale, PA-C  levocetirizine (XYZAL) 5 MG tablet Take 1 tablet (5 mg total) by mouth every evening. 03/09/14   Thao P Le, DO  magnesium gluconate (MAGONATE) 500 MG tablet Take 500 mg by mouth at bedtime.    Historical Provider, MD  pantoprazole (PROTONIX) 40 MG tablet Take 1 tablet (40 mg total) by mouth 2 (two) times daily. 03/09/14   Thao P Le, DO   BP 148/78 mmHg  Pulse 88  Temp(Src) 98.5 F (36.9 C) (Oral)  Resp 20  SpO2 100%  LMP 06/06/1996 Physical Exam  Constitutional: She is oriented to person, place, and time. She appears well-developed and well-nourished.  Non-toxic appearance. No distress.  HENT:  Head: Normocephalic and atraumatic.  Eyes: Conjunctivae, EOM and lids are normal. Pupils are equal, round, and reactive to light.  Neck: Normal range of motion. Neck supple. No tracheal deviation present. No thyroid mass present.  Cardiovascular: Normal rate, regular rhythm and normal heart sounds.  Exam reveals no gallop.   No murmur heard. Pulmonary/Chest: Effort normal and breath sounds normal. No  stridor. No respiratory distress. She has no decreased breath sounds. She has no wheezes. She has no rhonchi. She has no rales.  Abdominal: Soft. Normal appearance and bowel sounds are normal. She exhibits no distension. There is tenderness in the epigastric area. There is no rigidity, no rebound, no guarding and no CVA tenderness.  Musculoskeletal: Normal range of motion. She exhibits no edema or tenderness.  Neurological: She is alert and oriented to person, place, and time. She has normal strength. No cranial nerve deficit or sensory deficit. GCS eye subscore is 4. GCS verbal subscore is 5. GCS motor subscore is 6.  Skin: Skin is warm and dry. No abrasion and no rash noted.  Psychiatric: She has a normal mood and affect. Her speech is normal and behavior is normal.  Nursing note and vitals reviewed.   ED Course  Procedures (including critical care time) Labs Review Labs Reviewed  CBC WITH DIFFERENTIAL  COMPREHENSIVE METABOLIC PANEL  LIPASE, BLOOD  URINALYSIS, ROUTINE W REFLEX MICROSCOPIC    Imaging Review No results found.   EKG Interpretation None      MDM   Final diagnoses:  None     Date: 05/04/2014  Rate: 75  Rhythm: normal sinus rhythm  QRS Axis: normal  Intervals: normal  ST/T Wave abnormalities: normal  Conduction Disutrbances:none  Narrative Interpretation:   Old EKG Reviewed: none available    6:12 PM Patient given medications here feels much better. Repeat abdominal exam is nonsurgical. Patient to be given GI referral  Leota Jacobsen, MD 05/04/14 [phone]

## 2014-05-09 ENCOUNTER — Emergency Department (HOSPITAL_COMMUNITY)
Admission: EM | Admit: 2014-05-09 | Discharge: 2014-05-10 | Discharge status: Against Medical Advice | Payer: 59 | Attending: Emergency Medicine | Admitting: Emergency Medicine

## 2014-05-09 ENCOUNTER — Encounter (HOSPITAL_COMMUNITY): Payer: Self-pay | Admitting: Emergency Medicine

## 2014-05-09 ENCOUNTER — Ambulatory Visit (INDEPENDENT_AMBULATORY_CARE_PROVIDER_SITE_OTHER): Payer: 59

## 2014-05-09 ENCOUNTER — Ambulatory Visit (INDEPENDENT_AMBULATORY_CARE_PROVIDER_SITE_OTHER): Payer: 59 | Admitting: Emergency Medicine

## 2014-05-09 VITALS — BP 144/90 | HR 77 | Temp 98.4°F | Resp 18 | Ht 65.0 in | Wt 192.8 lb

## 2014-05-09 DIAGNOSIS — R1010 Upper abdominal pain, unspecified: Secondary | ICD-10-CM | POA: Insufficient documentation

## 2014-05-09 DIAGNOSIS — R1013 Epigastric pain: Secondary | ICD-10-CM

## 2014-05-09 DIAGNOSIS — J45909 Unspecified asthma, uncomplicated: Secondary | ICD-10-CM | POA: Diagnosis not present

## 2014-05-09 DIAGNOSIS — R110 Nausea: Secondary | ICD-10-CM | POA: Diagnosis not present

## 2014-05-09 LAB — COMPREHENSIVE METABOLIC PANEL
ALBUMIN: 4.2 g/dL (ref 3.5–5.2)
ALK PHOS: 65 U/L (ref 39–117)
ALT: 21 U/L (ref 0–35)
AST: 19 U/L (ref 0–37)
Anion gap: 8 (ref 5–15)
BILIRUBIN TOTAL: 0.8 mg/dL (ref 0.3–1.2)
BUN: 13 mg/dL (ref 6–23)
CHLORIDE: 101 meq/L (ref 96–112)
CO2: 30 mmol/L (ref 19–32)
Calcium: 10.1 mg/dL (ref 8.4–10.5)
Creatinine, Ser: 1.15 mg/dL — ABNORMAL HIGH (ref 0.50–1.10)
GFR calc Af Amer: 66 mL/min — ABNORMAL LOW (ref >90)
GFR calc non Af Amer: 57 mL/min — ABNORMAL LOW (ref >90)
Glucose, Bld: 104 mg/dL — ABNORMAL HIGH (ref 70–99)
POTASSIUM: 3.8 mmol/L (ref 3.5–5.1)
Sodium: 139 mmol/L (ref 135–145)
Total Protein: 7.3 g/dL (ref 6.0–8.3)

## 2014-05-09 LAB — POCT CBC
Granulocyte percent: 57.5 %G (ref 37–80)
HCT, POC: 45.8 % (ref 37.7–47.9)
Hemoglobin: 14.5 g/dL (ref 12.2–16.2)
LYMPH, POC: 5.6 — AB (ref 0.6–3.4)
MCH: 30 pg (ref 27–31.2)
MCHC: 31.7 g/dL — AB (ref 31.8–35.4)
MCV: 94.6 fL (ref 80–97)
MID (CBC): 0.5 (ref 0–0.9)
MPV: 7.6 fL (ref 0–99.8)
POC Granulocyte: 8.2 — AB (ref 2–6.9)
POC LYMPH %: 39.3 % (ref 10–50)
POC MID %: 3.2 %M (ref 0–12)
Platelet Count, POC: 344 10*3/uL (ref 142–424)
RBC: 4.84 M/uL (ref 4.04–5.48)
RDW, POC: 15.6 %
WBC: 14.3 10*3/uL — AB (ref 4.6–10.2)

## 2014-05-09 LAB — CBC WITH DIFFERENTIAL/PLATELET
Basophils Absolute: 0.1 10*3/uL (ref 0.0–0.1)
Basophils Relative: 0 % (ref 0–1)
EOS ABS: 0.2 10*3/uL (ref 0.0–0.7)
EOS PCT: 2 % (ref 0–5)
HCT: 41.5 % (ref 36.0–46.0)
Hemoglobin: 14.5 g/dL (ref 12.0–15.0)
LYMPHS ABS: 4.6 10*3/uL — AB (ref 0.7–4.0)
Lymphocytes Relative: 36 % (ref 12–46)
MCH: 30.7 pg (ref 26.0–34.0)
MCHC: 34.9 g/dL (ref 30.0–36.0)
MCV: 87.9 fL (ref 78.0–100.0)
Monocytes Absolute: 0.9 10*3/uL (ref 0.1–1.0)
Monocytes Relative: 7 % (ref 3–12)
Neutro Abs: 7 10*3/uL (ref 1.7–7.7)
Neutrophils Relative %: 55 % (ref 43–77)
PLATELETS: 331 10*3/uL (ref 150–400)
RBC: 4.72 MIL/uL (ref 3.87–5.11)
RDW: 13.3 % (ref 11.5–15.5)
WBC: 12.7 10*3/uL — ABNORMAL HIGH (ref 4.0–10.5)

## 2014-05-09 LAB — LIPASE, BLOOD: LIPASE: 27 U/L (ref 11–59)

## 2014-05-09 NOTE — ED Notes (Signed)
Pt. reports intermittent mid/upper abdominal pain onset last Friday with nausea , pt. stated  endoscopy procedure done this morning , seen at [facility] today advised to go to ER due to elevated WBC .

## 2014-05-09 NOTE — Progress Notes (Signed)
[facility]gent [facility] [address], [place] [address] [phone]  Date:  05/09/2014   Name:  Kendra Gonzalez   DOB:  02-Mar-1971   MRN:  [id number]  PCP:  Elizabeth Sauer    Chief Complaint: Abdominal Pain and Nausea   History of Present Illness:  Kendra Gonzalez is a 44 y.o. very pleasant female patient who presents with the following:  Seen in ER on 1/8 for abdominal pain.  Referred to GI and underwent EGD today.  Told she had an acute ulcer She was put on carafate and protonix.  In June had Hpylori postive and underwent treatment. No nausea or vomiting.  No stool change No improvement with medication.   Is very uncomfortable.   Taking liquids sparsely no food today No improvement with over the counter medications or other home remedies.  Denies other complaint or health concern today.   Patient Active Problem List   Diagnosis Date Noted  . Abdominal pain, chronic, right upper quadrant 06/23/2012  . Obesity (BMI 30.0-34.9) 10/12/2011  . CANDIDIASIS, VULVA/VAGINA 02/16/2007  . HYPERLIPIDEMIA 02/16/2007  . ALLERGIC RHINITIS 02/16/2007  . GERD 02/16/2007  . GLUCOSE INTOLERANCE, HX OF 02/16/2007    Past Medical History  Diagnosis Date  . Endometriosis   . Hyperlipidemia   . Kidney stones   . Asthma   . Allergy   . Pancreatitis   . History of tetanus, diphtheria, and acellular pertussis booster vaccination (Tdap)     07/30/2010    Past Surgical History  Procedure Laterality Date  . Cholecystectomy    . Abdominal hysterectomy    . Oophrectomy  2011    secondary to cysts  . Carpel tunnel    . Arthroscopic knee surgery     . Hip surgery    . Ercp  01/08/2012    Procedure: ENDOSCOPIC RETROGRADE CHOLANGIOPANCREATOGRAPHY (ERCP);  Surgeon: Beryle Beams, MD;  Location: [facility] ENDOSCOPY;  Service: Endoscopy;  Laterality: N/A;  . Tubal ligation      History  Substance Use Topics  . Smoking status: Never Smoker   . Smokeless tobacco: Never Used  .  Alcohol Use: No    Family History  Problem Relation Age of Onset  . Heart disease Daughter   . Hypertension Father   . Hypertension Sister     No Known Allergies  Medication list has been reviewed and updated.  Current Outpatient Prescriptions on File Prior to Visit  Medication Sig Dispense Refill  . acetaminophen (TYLENOL) 500 MG tablet Take 1,000 mg by mouth every 6 (six) hours as needed for mild pain or headache.    . albuterol (PROVENTIL HFA;VENTOLIN HFA) 108 (90 BASE) MCG/ACT inhaler Inhale 2 puffs into the lungs every 6 (six) hours as needed for wheezing or shortness of breath. 1 Inhaler 6  . magnesium gluconate (MAGONATE) 500 MG tablet Take 500 mg by mouth at bedtime.    . pantoprazole (PROTONIX) 40 MG tablet Take 1 tablet (40 mg total) by mouth 2 (two) times daily. 60 tablet 6  . pregabalin (LYRICA) 25 MG capsule Take 25 mg by mouth 3 (three) times daily.    . sucralfate (CARAFATE) 1 G tablet Take 1 tablet (1 g total) by mouth 4 (four) times daily. 30 tablet 0  . Boric Acid POWD Place 600 mg capsule intravaginally once a week: Boric acid suppository (Patient not taking: Reported on 05/04/2014) 180 g 6  . fluticasone-salmeterol (ADVAIR HFA) 115-21 MCG/ACT inhaler Inhale 2 puffs into  the lungs 2 (two) times daily. (Patient not taking: Reported on 05/09/2014) 1 Inhaler 6  . levocetirizine (XYZAL) 5 MG tablet Take 1 tablet (5 mg total) by mouth every evening. (Patient not taking: Reported on 05/04/2014) 30 tablet 6   No current facility-administered medications on file prior to visit.    Review of Systems:  As per HPI, otherwise negative.    Physical Examination: Filed Vitals:   05/09/14 2032  BP: 144/90  Pulse: 77  Temp: 98.4 F (36.9 C)  Resp: 18   Filed Vitals:   05/09/14 2032  Height: 5\' 5"  (1.651 m)  Weight: 192 lb 12.8 oz (87.454 kg)   Body mass index is 32.08 kg/(m^2). Ideal Body Weight: Weight in (lb) to have BMI = 25: 149.9  GEN: WDWN, ill appearing.,  Non-toxic, A & O x 3 HEENT: Atraumatic, Normocephalic. Neck supple. No masses, No LAD. Ears and Nose: No external deformity. CV: RRR, No M/G/R. No JVD. No thrill. No extra heart sounds. PULM: CTA B, no wheezes, crackles, rhonchi. No retractions. No resp. distress. No accessory muscle use. ABD: S, NT, ND, +BS. No rebound. No HSM. EXTR: No c/c/e NEURO Normal gait.  PSYCH: Normally interactive. Conversant. Not depressed or anxious appearing.  Calm demeanor.    Assessment and Plan: Abdominal pain following EGD Can not exclude a perforation To ER prefers by pov  Signed,  Ellison Carwin, MD  Results for orders placed or performed in visit on 05/09/14  POCT CBC  Result Value Ref Range   WBC 14.3 (A) 4.6 - 10.2 K/uL   Lymph, poc 5.6 (A) 0.6 - 3.4   POC LYMPH PERCENT 39.3 10 - 50 %L   MID (cbc) 0.5 0 - 0.9   POC MID % 3.2 0 - 12 %M   POC Granulocyte 8.2 (A) 2 - 6.9   Granulocyte percent 57.5 37 - 80 %G   RBC 4.84 4.04 - 5.48 M/uL   Hemoglobin 14.5 12.2 - 16.2 g/dL   HCT, POC 45.8 37.7 - 47.9 %   MCV 94.6 80 - 97 fL   MCH, POC 30.0 27 - 31.2 pg   MCHC 31.7 (A) 31.8 - 35.4 g/dL   RDW, POC 15.6 %   Platelet Count, POC 344 142 - 424 K/uL   MPV 7.6 0 - 99.8 fL   [facility] reading (PRIMARY) by  Dr. Ouida Sills  Negative .

## 2014-05-09 NOTE — Patient Instructions (Signed)

## 2014-05-09 NOTE — ED Notes (Signed)
Pt states she is unable to wait due to wait.  Encouraged pt to stay and she declined.

## 2014-05-22 ENCOUNTER — Encounter: Payer: Self-pay | Admitting: Family Medicine

## 2014-06-18 ENCOUNTER — Ambulatory Visit: Payer: 59 | Admitting: Gastroenterology

## 2014-06-23 ENCOUNTER — Ambulatory Visit (INDEPENDENT_AMBULATORY_CARE_PROVIDER_SITE_OTHER): Payer: 59 | Admitting: Emergency Medicine

## 2014-06-23 ENCOUNTER — Ambulatory Visit (INDEPENDENT_AMBULATORY_CARE_PROVIDER_SITE_OTHER): Payer: 59

## 2014-06-23 VITALS — BP 136/84 | HR 85 | Temp 97.9°F | Resp 16 | Ht 64.5 in | Wt 198.4 lb

## 2014-06-23 DIAGNOSIS — E049 Nontoxic goiter, unspecified: Secondary | ICD-10-CM

## 2014-06-23 DIAGNOSIS — Z8709 Personal history of other diseases of the respiratory system: Secondary | ICD-10-CM

## 2014-06-23 DIAGNOSIS — R059 Cough, unspecified: Secondary | ICD-10-CM

## 2014-06-23 DIAGNOSIS — R609 Edema, unspecified: Secondary | ICD-10-CM

## 2014-06-23 DIAGNOSIS — R600 Localized edema: Secondary | ICD-10-CM

## 2014-06-23 DIAGNOSIS — K111 Hypertrophy of salivary gland: Secondary | ICD-10-CM

## 2014-06-23 DIAGNOSIS — R05 Cough: Secondary | ICD-10-CM

## 2014-06-23 LAB — POCT CBC
GRANULOCYTE PERCENT: 50.2 % (ref 37–80)
HCT, POC: 40.9 % (ref 37.7–47.9)
HEMOGLOBIN: 13.2 g/dL (ref 12.2–16.2)
LYMPH, POC: 3.7 — AB (ref 0.6–3.4)
MCH: 29.8 pg (ref 27–31.2)
MCHC: 32.3 g/dL (ref 31.8–35.4)
MCV: 92.5 fL (ref 80–97)
MID (CBC): 0.6 (ref 0–0.9)
MPV: 7.4 fL (ref 0–99.8)
POC Granulocyte: 4.4 (ref 2–6.9)
POC LYMPH PERCENT: 42.7 %L (ref 10–50)
POC MID %: 7.1 %M (ref 0–12)
Platelet Count, POC: 285 10*3/uL (ref 142–424)
RBC: 4.42 M/uL (ref 4.04–5.48)
RDW, POC: 14.2 %
WBC: 8.7 10*3/uL (ref 4.6–10.2)

## 2014-06-23 MED ORDER — IPRATROPIUM BROMIDE 0.02 % IN SOLN
0.5000 mg | Freq: Once | RESPIRATORY_TRACT | Status: AC
  Administered 2014-06-23: 0.5 mg via RESPIRATORY_TRACT

## 2014-06-23 MED ORDER — HYDROCODONE-HOMATROPINE 5-1.5 MG/5ML PO SYRP: 5.0000 mL | ORAL_SOLUTION | Freq: Three times a day (TID) | ORAL | Status: DC | PRN

## 2014-06-23 MED ORDER — ALBUTEROL SULFATE (2.5 MG/3ML) 0.083% IN NEBU
2.5000 mg | INHALATION_SOLUTION | Freq: Once | RESPIRATORY_TRACT | Status: AC
  Administered 2014-06-23: 2.5 mg via RESPIRATORY_TRACT

## 2014-06-23 MED ORDER — BENZONATATE 100 MG PO CAPS: 100.0000 mg | ORAL_CAPSULE | Freq: Three times a day (TID) | ORAL | Status: DC | PRN

## 2014-06-23 NOTE — Progress Notes (Signed)
Subjective:    Patient ID: Kendra Gonzalez, female    DOB: 05/13/1970, 44 y.o.   MRN: [id number]  Cough This is a new problem. The current episode started 1 to 4 weeks ago. The problem has been gradually worsening. The problem occurs constantly. The cough is non-productive. Pertinent negatives include no chest pain, chills, ear congestion, ear pain, fever, hemoptysis, myalgias, nasal congestion, sore throat, shortness of breath or sweats.  Neck Pain  Pertinent negatives include no chest pain or fever.      Review of Systems  Constitutional: Negative for fever and chills.  HENT: Negative for ear pain and sore throat.        She has felt some swelling in the left side of her neck.  Respiratory: Positive for cough. Negative for hemoptysis and shortness of breath.   Cardiovascular: Negative for chest pain.  Musculoskeletal: Positive for neck pain. Negative for myalgias.       Objective:   Physical Exam  Constitutional: She is oriented to person, place, and time. She appears well-developed and well-nourished.  HENT:  Right Ear: External ear normal.  Left Ear: External ear normal.  Eyes: Pupils are equal, round, and reactive to light.  Neck:  There is a subtle increase in size of the thyroid left greater than right. There was no definite mass in this area. There was no crepitation felt.  Cardiovascular: Normal rate, regular rhythm and intact distal pulses.  Exam reveals no gallop and no friction rub.   No murmur heard. Pulmonary/Chest: Effort normal and breath sounds normal.  She did not have a true wheezes. She had paroxysmal of cough.  Abdominal: Soft. Bowel sounds are normal.  Musculoskeletal: Normal range of motion.  Neurological: She is alert and oriented to person, place, and time.  Skin: Skin is warm.  Psychiatric: She has a normal mood and affect. Her behavior is normal.   [facility] reading (PRIMARY) by  Dr.Nocholas Damaso no acute disease heart size normal on the lateral view there  may be one bronchial tube which is prominent.  Results for orders placed or performed in visit on 06/23/14  POCT CBC  Result Value Ref Range   WBC 8.7 4.6 - 10.2 K/uL   Lymph, poc 3.7 (A) 0.6 - 3.4   POC LYMPH PERCENT 42.7 10 - 50 %L   MID (cbc) 0.6 0 - 0.9   POC MID % 7.1 0 - 12 %M   POC Granulocyte 4.4 2 - 6.9   Granulocyte percent 50.2 37 - 80 %G   RBC 4.42 4.04 - 5.48 M/uL   Hemoglobin 13.2 12.2 - 16.2 g/dL   HCT, POC 40.9 37.7 - 47.9 %   MCV 92.5 80 - 97 fL   MCH, POC 29.8 27 - 31.2 pg   MCHC 32.3 31.8 - 35.4 g/dL   RDW, POC 14.2 %   Platelet Count, POC 285 142 - 424 K/uL   MPV 7.4 0 - 99.8 fL   Meds ordered this encounter  Medications  . albuterol (PROVENTIL) (2.5 MG/3ML) 0.083% nebulizer solution 2.5 mg    Sig:   . ipratropium (ATROVENT) nebulizer solution 0.5 mg    Sig:   . HYDROcodone-homatropine (HYCODAN) 5-1.5 MG/5ML syrup    Sig: Take 5 mLs by mouth every 8 (eight) hours as needed for cough.    Dispense:  120 mL    Refill:  0  . benzonatate (TESSALON) 100 MG capsule    Sig: Take 1-2 capsules (100-200 mg total) by  mouth 3 (three) times daily as needed for cough.    Dispense:  40 capsule    Refill:  0       Assessment & Plan:  We will proceed with CBC and thyroid studies. Chest x-rays ordered. Will treat with albuterol and Atrovent by nebulizer. She has currently been using her albuterol inhaler about twice a day. Will use Tessalon Perles and Hycodan to help with her cough. I did not put her on steroids because she has recurrent issues with ulcer disease. She will continue her Carafate and Protonix.I personally performed the services described in this documentation, which was scribed in my presence. The recorded information has been reviewed and is accurate. Peak flow after treatment was 340 her coughing did seem to be improved. She will follow-up with Dr. Rush [facility] group. I did also schedule an ultrasound of her neck to follow-up on her left neck fullness. I had her  do 3 laps in the hall and her oxygen level stayed 97/98.I personally performed the services described in this documentation, which was scribed in my presence. The recorded information has been reviewed and is accurate.

## 2014-06-23 NOTE — Patient Instructions (Signed)
Cough, Adult  A cough is a reflex that helps clear your throat and airways. It can help heal the body or may be a reaction to an irritated airway. A cough may only last 2 or 3 weeks (acute) or may last more than 8 weeks (chronic).  CAUSES Acute cough:  Viral or bacterial infections. Chronic cough:  Infections.  Allergies.  Asthma.  Post-nasal drip.  Smoking.  Heartburn or acid reflux.  Some medicines.  Chronic lung problems (COPD).  Cancer. SYMPTOMS   Cough.  Fever.  Chest pain.  Increased breathing rate.  High-pitched whistling sound when breathing (wheezing).  Colored mucus that you cough up (sputum). TREATMENT   A bacterial cough may be treated with antibiotic medicine.  A viral cough must run its course and will not respond to antibiotics.  Your caregiver may recommend other treatments if you have a chronic cough. HOME CARE INSTRUCTIONS   Only take over-the-counter or prescription medicines for pain, discomfort, or fever as directed by your caregiver. Use cough suppressants only as directed by your caregiver.  Use a cold steam vaporizer or humidifier in your bedroom or home to help loosen secretions.  Sleep in a semi-upright position if your cough is worse at night.  Rest as needed.  Stop smoking if you smoke. SEEK IMMEDIATE MEDICAL CARE IF:   You have pus in your sputum.  Your cough starts to worsen.  You cannot control your cough with suppressants and are losing sleep.  You begin coughing up blood.  You have difficulty breathing.  You develop pain which is getting worse or is uncontrolled with medicine.  You have a fever. MAKE SURE YOU:   Understand these instructions.  Will watch your condition.  Will get help right away if you are not doing well or get worse. Document Released: 10/10/2010 Document Revised: 07/06/2011 Document Reviewed: 10/10/2010 Exit[employer] Patient Information 2015 [employer]. This information is not intended  to replace advice given to you by your health care provider. Make sure you discuss any questions you have with your health care provider.  

## 2014-06-24 LAB — TSH: TSH: 0.28 u[IU]/mL — ABNORMAL LOW (ref 0.350–4.500)

## 2014-06-24 LAB — T4, FREE: Free T4: 0.86 ng/dL (ref 0.80–1.80)

## 2014-06-29 ENCOUNTER — Ambulatory Visit
Admission: RE | Admit: 2014-06-29 | Discharge: 2014-06-29 | Disposition: A | Discharge status: Home / Self Care | Payer: 59 | Source: Ambulatory Visit | Attending: Emergency Medicine | Admitting: Emergency Medicine

## 2014-06-29 DIAGNOSIS — E049 Nontoxic goiter, unspecified: Secondary | ICD-10-CM

## 2014-07-01 ENCOUNTER — Telehealth: Payer: Self-pay | Admitting: Emergency Medicine

## 2014-07-01 ENCOUNTER — Other Ambulatory Visit: Payer: Self-pay | Admitting: Emergency Medicine

## 2014-07-01 DIAGNOSIS — E041 Nontoxic single thyroid nodule: Secondary | ICD-10-CM

## 2014-07-01 NOTE — Telephone Encounter (Signed)
Message left for Kendra Gonzalez that I would be happy to discuss her ultrasound. I advised her to call back tomorrow.

## 2014-07-01 NOTE — Telephone Encounter (Signed)
I called and spoke with patient. She is agreeable to proceed with biopsy

## 2014-07-09 ENCOUNTER — Other Ambulatory Visit: Payer: Self-pay | Admitting: *Deleted

## 2014-07-09 DIAGNOSIS — E049 Nontoxic goiter, unspecified: Secondary | ICD-10-CM

## 2014-07-10 ENCOUNTER — Other Ambulatory Visit: Payer: Self-pay | Admitting: Family Medicine

## 2014-07-10 DIAGNOSIS — E049 Nontoxic goiter, unspecified: Secondary | ICD-10-CM

## 2014-07-17 ENCOUNTER — Other Ambulatory Visit (HOSPITAL_COMMUNITY)
Admission: RE | Admit: 2014-07-17 | Discharge: 2014-07-17 | Disposition: A | Discharge status: Home / Self Care | Payer: 59 | Source: Ambulatory Visit | Attending: Interventional Radiology | Admitting: Interventional Radiology

## 2014-07-17 ENCOUNTER — Ambulatory Visit
Admission: RE | Admit: 2014-07-17 | Discharge: 2014-07-17 | Disposition: A | Discharge status: Home / Self Care | Payer: 59 | Source: Ambulatory Visit | Attending: Emergency Medicine | Admitting: Emergency Medicine

## 2014-07-17 DIAGNOSIS — E049 Nontoxic goiter, unspecified: Secondary | ICD-10-CM

## 2014-07-17 DIAGNOSIS — E041 Nontoxic single thyroid nodule: Secondary | ICD-10-CM | POA: Insufficient documentation

## 2014-07-24 ENCOUNTER — Other Ambulatory Visit: Payer: 59

## 2014-10-31 ENCOUNTER — Ambulatory Visit (INDEPENDENT_AMBULATORY_CARE_PROVIDER_SITE_OTHER): Payer: Commercial Managed Care - HMO | Admitting: Family Medicine

## 2014-10-31 VITALS — BP 128/70 | HR 83 | Temp 98.3°F | Resp 18 | Ht 65.0 in | Wt 194.0 lb

## 2014-10-31 DIAGNOSIS — R1032 Left lower quadrant pain: Secondary | ICD-10-CM | POA: Diagnosis not present

## 2014-10-31 LAB — POCT CBC
Granulocyte percent: 57.6 %G (ref 37–80)
HCT, POC: 38.8 % (ref 37.7–47.9)
HEMOGLOBIN: 12.6 g/dL (ref 12.2–16.2)
LYMPH, POC: 3.2 (ref 0.6–3.4)
MCH, POC: 29 pg (ref 27–31.2)
MCHC: 32.6 g/dL (ref 31.8–35.4)
MCV: 89 fL (ref 80–97)
MID (cbc): 0.5 (ref 0–0.9)
MPV: 7.6 fL (ref 0–99.8)
POC GRANULOCYTE: 5 (ref 2–6.9)
POC LYMPH PERCENT: 36.8 %L (ref 10–50)
POC MID %: 5.6 %M (ref 0–12)
Platelet Count, POC: 294 10*3/uL (ref 142–424)
RBC: 4.36 M/uL (ref 4.04–5.48)
RDW, POC: 13.8 %
WBC: 8.6 10*3/uL (ref 4.6–10.2)

## 2014-10-31 LAB — POCT URINALYSIS DIPSTICK
Bilirubin, UA: NEGATIVE
Glucose, UA: NEGATIVE
Ketones, UA: NEGATIVE
Leukocytes, UA: NEGATIVE
NITRITE UA: NEGATIVE
PH UA: 5.5
Protein, UA: NEGATIVE
Spec Grav, UA: >=1.03
Urobilinogen, UA: 0.2

## 2014-10-31 LAB — POCT UA - MICROSCOPIC ONLY
Casts, Ur, LPF, POC: NEGATIVE
Crystals, Ur, HPF, POC: NEGATIVE
Mucus, UA: POSITIVE
Yeast, UA: NEGATIVE

## 2014-10-31 LAB — COMPREHENSIVE METABOLIC PANEL
ALK PHOS: 55 U/L (ref 39–117)
ALT: 13 U/L (ref 0–35)
AST: 15 U/L (ref 0–37)
Albumin: 3.9 g/dL (ref 3.5–5.2)
BILIRUBIN TOTAL: 0.5 mg/dL (ref 0.2–1.2)
BUN: 10 mg/dL (ref 6–23)
CO2: 26 meq/L (ref 19–32)
Calcium: 9.5 mg/dL (ref 8.4–10.5)
Chloride: 105 mEq/L (ref 96–112)
Creat: 0.9 mg/dL (ref 0.50–1.10)
Glucose, Bld: 103 mg/dL — ABNORMAL HIGH (ref 70–99)
POTASSIUM: 4.2 meq/L (ref 3.5–5.3)
SODIUM: 140 meq/L (ref 135–145)
TOTAL PROTEIN: 6.7 g/dL (ref 6.0–8.3)

## 2014-10-31 LAB — LIPASE: LIPASE: 14 U/L (ref 0–75)

## 2014-10-31 LAB — POCT URINE PREGNANCY: PREG TEST UR: NEGATIVE

## 2014-10-31 MED ORDER — CEPHALEXIN 500 MG PO CAPS: 500.0000 mg | ORAL_CAPSULE | Freq: Two times a day (BID) | ORAL | Status: DC

## 2014-10-31 NOTE — Patient Instructions (Addendum)
Please go to the [facility] tomorrow at [place] and let them know that you have an ultrasound scheduled I will give you a call with your ultrasound results tomorrow Let me know if you have any worsening in the meantime.   Let's try using an antibiotic for possible UTI while we wait on your urine culture results as well- take the keflex twice a day

## 2014-10-31 NOTE — Progress Notes (Addendum)
[facility] [address], [place] [address] [phone]  Date:  10/31/2014   Name:  Kendra Gonzalez   DOB:  08-04-1970   MRN:  [id number]  PCP:  Elizabeth Sauer    Chief Complaint: pelvic cramping   History of Present Illness:  Kendra Gonzalez is a 44 y.o. very pleasant female patient who presents with the following:  She notes left abd cramping for about 3 days- like period cramps She had a hysterectomy in 1998- she has her left ovary only. No nausea, vomiting or diarrhea, eating ok.   No blood or mucus in her stools.   No hematuria or blood in her urine No aches or fever She has used tylenol- however it does not seem to help No history of diverticulosis   She had the hyst due to endometriosis and has generally done well with this issue since her operation   Patient Active Problem List   Diagnosis Date Noted  . Abdominal pain, chronic, right upper quadrant 06/23/2012  . Obesity (BMI 30.0-34.9) 10/12/2011  . CANDIDIASIS, VULVA/VAGINA 02/16/2007  . HYPERLIPIDEMIA 02/16/2007  . ALLERGIC RHINITIS 02/16/2007  . GERD 02/16/2007  . GLUCOSE INTOLERANCE, HX OF 02/16/2007    Past Medical History  Diagnosis Date  . Endometriosis   . Hyperlipidemia   . Kidney stones   . Asthma   . Allergy   . Pancreatitis   . History of tetanus, diphtheria, and acellular pertussis booster vaccination (Tdap)     07/30/2010    Past Surgical History  Procedure Laterality Date  . Cholecystectomy    . Abdominal hysterectomy    . Oophrectomy  2011    secondary to cysts  . Carpel tunnel    . Arthroscopic knee surgery     . Hip surgery    . Ercp  01/08/2012    Procedure: ENDOSCOPIC RETROGRADE CHOLANGIOPANCREATOGRAPHY (ERCP);  Surgeon: Beryle Beams, MD;  Location: [facility] ENDOSCOPY;  Service: Endoscopy;  Laterality: N/A;  . Tubal ligation      History  Substance Use Topics  . Smoking status: Never Smoker   . Smokeless tobacco: Never Used  . Alcohol Use: No     Family History  Problem Relation Age of Onset  . Heart disease Daughter   . Hypertension Father   . Hypertension Sister     No Known Allergies  Medication list has been reviewed and updated.  Current Outpatient Prescriptions on File Prior to Visit  Medication Sig Dispense Refill  . acetaminophen (TYLENOL) 500 MG tablet Take 1,000 mg by mouth every 6 (six) hours as needed for mild pain or headache.    . albuterol (PROVENTIL HFA;VENTOLIN HFA) 108 (90 BASE) MCG/ACT inhaler Inhale 2 puffs into the lungs every 6 (six) hours as needed for wheezing or shortness of breath. 1 Inhaler 6  . Boric Acid POWD Place 600 mg capsule intravaginally once a week: Boric acid suppository 180 g 6  . fluticasone-salmeterol (ADVAIR HFA) 115-21 MCG/ACT inhaler Inhale 2 puffs into the lungs 2 (two) times daily. 1 Inhaler 6  . magnesium gluconate (MAGONATE) 500 MG tablet Take 500 mg by mouth at bedtime.    . pantoprazole (PROTONIX) 40 MG tablet Take 1 tablet (40 mg total) by mouth 2 (two) times daily. 60 tablet 6  . pregabalin (LYRICA) 25 MG capsule Take 25 mg by mouth 3 (three) times daily.    . benzonatate (TESSALON) 100 MG capsule Take 1-2 capsules (100-200 mg total) by mouth 3 (  three) times daily as needed for cough. (Patient not taking: Reported on 10/31/2014) 40 capsule 0  . HYDROcodone-homatropine (HYCODAN) 5-1.5 MG/5ML syrup Take 5 mLs by mouth every 8 (eight) hours as needed for cough. (Patient not taking: Reported on 10/31/2014) 120 mL 0  . levocetirizine (XYZAL) 5 MG tablet Take 1 tablet (5 mg total) by mouth every evening. (Patient not taking: Reported on 10/31/2014) 30 tablet 6  . sucralfate (CARAFATE) 1 G tablet Take 1 tablet (1 g total) by mouth 4 (four) times daily. (Patient not taking: Reported on 10/31/2014) 30 tablet 0   No current facility-administered medications on file prior to visit.    Review of Systems:  As per HPI- otherwise negative.   Physical Examination: Filed Vitals:   10/31/14  1440  BP: 128/70  Pulse: 83  Temp: 98.3 F (36.8 C)  Resp: 18   Filed Vitals:   10/31/14 1440  Height: 5\' 5"  (1.651 m)  Weight: 194 lb (87.998 kg)   Body mass index is 32.28 kg/(m^2). Ideal Body Weight: Weight in (lb) to have BMI = 25: 149.9  GEN: WDWN, NAD, Non-toxic, A & O x 3, overweight, looks well HEENT: Atraumatic, Normocephalic. Neck supple. No masses, No LAD. Ears and Nose: No external deformity. CV: RRR, No M/G/R. No JVD. No thrill. No extra heart sounds. PULM: CTA B, no wheezes, crackles, rhonchi. No retractions. No resp. distress. No accessory muscle use. ABD: S, ND, +BS. No rebound. No HSM.  Minimal tenderness in the LLQ, near to the pubis.  No bony TTP EXTR: No c/c/e NEURO Normal gait.  PSYCH: Normally interactive. Conversant. Not depressed or anxious appearing.  Calm demeanor.  Pelvic: normal, no vaginal lesions or discharge. Uterus and cervix absent. No adnexal tendereness or masses    Results for orders placed or performed in visit on 10/31/14  POCT UA - Microscopic Only  Result Value Ref Range   WBC, Ur, HPF, POC 0-3    RBC, urine, microscopic 0-2    Bacteria, U Microscopic few    Mucus, UA pos    Epithelial cells, urine per micros 0-1    Crystals, Ur, HPF, POC neg    Casts, Ur, LPF, POC neg    Yeast, UA neg   POCT urinalysis dipstick  Result Value Ref Range   Color, UA yellow    Clarity, UA clear    Glucose, UA neg    Bilirubin, UA neg    Ketones, UA neg    Spec Grav, UA >=1.030    Blood, UA trace-lysed    pH, UA 5.5    Protein, UA neg    Urobilinogen, UA 0.2    Nitrite, UA neg    Leukocytes, UA Negative Negative  POCT urine pregnancy  Result Value Ref Range   Preg Test, Ur Negative Negative  POCT CBC  Result Value Ref Range   WBC 8.6 4.6 - 10.2 K/uL   Lymph, poc 3.2 0.6 - 3.4   POC LYMPH PERCENT 36.8 10 - 50 %L   MID (cbc) 0.5 0 - 0.9   POC MID % 5.6 0 - 12 %M   POC Granulocyte 5.0 2 - 6.9   Granulocyte percent 57.6 37 - 80 %G   RBC  4.36 4.04 - 5.48 M/uL   Hemoglobin 12.6 12.2 - 16.2 g/dL   HCT, POC 38.8 37.7 - 47.9 %   MCV 89 80 - 97 fL   MCH, POC 29 27 - 31.2 pg   MCHC 32.6 31.8 -  35.4 g/dL   RDW, POC 13.8 %   Platelet Count, POC 294 142 - 424 K/uL   MPV 7.6 0 - 99.8 fL    Assessment and Plan: Abdominal pain, left lower quadrant - Plan: POCT UA - Microscopic Only, POCT urinalysis dipstick, POCT urine pregnancy, POCT CBC, Comprehensive metabolic panel, Lipase, Urine culture, US Pelvis Complete, US Transvaginal Non-OB, cephALEXin (KEFLEX) 500 MG capsule  Here today with LLQ pain.  Diverticulitis possible but less likely given her normal CBC, no GI sx, normal appetite.  Suspect an ovarian cyst.  Also consider UTI- will start on keflex while we await culture Scheduled [place] for tomorrow am- not able to get tonight Await [place] and the rest of her labs   Signed Lamar Blinks, MD  Called and [facility] 7/7- it does not look like she went to her ultrasound.  Is she feeling better?  Called again in the evening, still no answer  Called 7/8- no answer.  LMOM- checking on her, urine grew a small amount of bacteria.  I assume she changed her mind about the ultrasound but we can still have this done if she likes.  Let me know if any concerns

## 2014-11-01 ENCOUNTER — Ambulatory Visit (HOSPITAL_COMMUNITY): Admission: RE | Admit: 2014-11-01 | Payer: 59 | Source: Ambulatory Visit

## 2014-11-01 LAB — URINE CULTURE: Colony Count: 25000

## 2014-11-02 ENCOUNTER — Encounter: Payer: Self-pay | Admitting: Family Medicine

## 2014-12-04 ENCOUNTER — Other Ambulatory Visit: Payer: Self-pay | Admitting: Orthopaedic Surgery

## 2014-12-04 DIAGNOSIS — M25562 Pain in left knee: Secondary | ICD-10-CM

## 2014-12-08 ENCOUNTER — Ambulatory Visit
Admission: RE | Admit: 2014-12-08 | Discharge: 2014-12-08 | Disposition: A | Discharge status: Home / Self Care | Payer: 59 | Source: Ambulatory Visit | Attending: Orthopaedic Surgery | Admitting: Orthopaedic Surgery

## 2014-12-08 DIAGNOSIS — M25562 Pain in left knee: Secondary | ICD-10-CM

## 2015-02-26 ENCOUNTER — Other Ambulatory Visit: Payer: Self-pay | Admitting: Orthopaedic Surgery

## 2015-02-26 DIAGNOSIS — M4716 Other spondylosis with myelopathy, lumbar region: Secondary | ICD-10-CM

## 2015-02-27 ENCOUNTER — Ambulatory Visit: Payer: Commercial Managed Care - HMO | Admitting: Diagnostic Neuroimaging

## 2015-03-07 ENCOUNTER — Ambulatory Visit
Admission: RE | Admit: 2015-03-07 | Discharge: 2015-03-07 | Disposition: A | Discharge status: Home / Self Care | Payer: 59 | Source: Ambulatory Visit | Attending: Orthopaedic Surgery | Admitting: Orthopaedic Surgery

## 2015-03-07 DIAGNOSIS — M4716 Other spondylosis with myelopathy, lumbar region: Secondary | ICD-10-CM

## 2015-03-19 ENCOUNTER — Encounter: Payer: Self-pay | Admitting: Physician Assistant

## 2015-03-19 ENCOUNTER — Ambulatory Visit (INDEPENDENT_AMBULATORY_CARE_PROVIDER_SITE_OTHER): Payer: Commercial Managed Care - HMO | Admitting: Physician Assistant

## 2015-03-19 VITALS — BP 131/83 | HR 66 | Temp 98.4°F | Resp 16 | Ht 65.5 in | Wt 197.8 lb

## 2015-03-19 DIAGNOSIS — A499 Bacterial infection, unspecified: Secondary | ICD-10-CM

## 2015-03-19 DIAGNOSIS — Z8719 Personal history of other diseases of the digestive system: Secondary | ICD-10-CM

## 2015-03-19 DIAGNOSIS — J4530 Mild persistent asthma, uncomplicated: Secondary | ICD-10-CM

## 2015-03-19 DIAGNOSIS — R7989 Other specified abnormal findings of blood chemistry: Secondary | ICD-10-CM

## 2015-03-19 DIAGNOSIS — Z131 Encounter for screening for diabetes mellitus: Secondary | ICD-10-CM | POA: Diagnosis not present

## 2015-03-19 DIAGNOSIS — N760 Acute vaginitis: Secondary | ICD-10-CM

## 2015-03-19 DIAGNOSIS — Z1321 Encounter for screening for nutritional disorder: Secondary | ICD-10-CM

## 2015-03-19 DIAGNOSIS — G4452 New daily persistent headache (NDPH): Secondary | ICD-10-CM | POA: Diagnosis not present

## 2015-03-19 DIAGNOSIS — Z8711 Personal history of peptic ulcer disease: Secondary | ICD-10-CM

## 2015-03-19 DIAGNOSIS — Z13220 Encounter for screening for lipoid disorders: Secondary | ICD-10-CM

## 2015-03-19 DIAGNOSIS — Z0000 Encounter for general adult medical examination without abnormal findings: Secondary | ICD-10-CM

## 2015-03-19 DIAGNOSIS — B9689 Other specified bacterial agents as the cause of diseases classified elsewhere: Secondary | ICD-10-CM

## 2015-03-19 DIAGNOSIS — R946 Abnormal results of thyroid function studies: Secondary | ICD-10-CM | POA: Diagnosis not present

## 2015-03-19 LAB — TSH: TSH: 0.288 u[IU]/mL — ABNORMAL LOW (ref 0.350–4.500)

## 2015-03-19 LAB — CBC
HCT: 40.7 % (ref 36.0–46.0)
HEMOGLOBIN: 13.8 g/dL (ref 12.0–15.0)
MCH: 29.9 pg (ref 26.0–34.0)
MCHC: 33.9 g/dL (ref 30.0–36.0)
MCV: 88.1 fL (ref 78.0–100.0)
MPV: 10.5 fL (ref 8.6–12.4)
Platelets: 318 10*3/uL (ref 150–400)
RBC: 4.62 MIL/uL (ref 3.87–5.11)
RDW: 13.6 % (ref 11.5–15.5)
WBC: 7.8 10*3/uL (ref 4.0–10.5)

## 2015-03-19 LAB — COMPREHENSIVE METABOLIC PANEL
ALBUMIN: 4.2 g/dL (ref 3.6–5.1)
ALT: 13 U/L (ref 6–29)
AST: 18 U/L (ref 10–35)
Alkaline Phosphatase: 62 U/L (ref 33–115)
BUN: 10 mg/dL (ref 7–25)
CALCIUM: 9.6 mg/dL (ref 8.6–10.2)
CHLORIDE: 102 mmol/L (ref 98–110)
CO2: 27 mmol/L (ref 20–31)
CREATININE: 0.79 mg/dL (ref 0.50–1.10)
Glucose, Bld: 97 mg/dL (ref 65–99)
POTASSIUM: 4.3 mmol/L (ref 3.5–5.3)
SODIUM: 138 mmol/L (ref 135–146)
Total Bilirubin: 0.7 mg/dL (ref 0.2–1.2)
Total Protein: 7.1 g/dL (ref 6.1–8.1)

## 2015-03-19 LAB — POCT URINALYSIS DIP (MANUAL ENTRY)
BILIRUBIN UA: NEGATIVE
GLUCOSE UA: NEGATIVE
Leukocytes, UA: NEGATIVE
Nitrite, UA: NEGATIVE
Protein Ur, POC: NEGATIVE
RBC UA: NEGATIVE
SPEC GRAV UA: 1.025
Urobilinogen, UA: 0.2
pH, UA: 7

## 2015-03-19 LAB — LIPID PANEL
CHOL/HDL RATIO: 2.7 ratio (ref <=5.0)
CHOLESTEROL: 228 mg/dL — AB (ref 125–200)
HDL: 83 mg/dL (ref >=46)
LDL CALC: 135 mg/dL — AB (ref <130)
TRIGLYCERIDES: 50 mg/dL (ref <150)
VLDL: 10 mg/dL (ref <30)

## 2015-03-19 MED ORDER — ALBUTEROL SULFATE HFA 108 (90 BASE) MCG/ACT IN AERS: 2.0000 | INHALATION_SPRAY | Freq: Four times a day (QID) | RESPIRATORY_TRACT | Status: DC | PRN

## 2015-03-19 MED ORDER — BORIC ACID POWD: Status: DC

## 2015-03-19 MED ORDER — PANTOPRAZOLE SODIUM 40 MG PO TBEC: 40.0000 mg | DELAYED_RELEASE_TABLET | Freq: Every day | ORAL | Status: DC

## 2015-03-19 MED ORDER — FLUTICASONE-SALMETEROL 115-21 MCG/ACT IN AERO: 2.0000 | INHALATION_SPRAY | Freq: Two times a day (BID) | RESPIRATORY_TRACT | Status: DC

## 2015-03-19 NOTE — Patient Instructions (Addendum)
Try tramadol with tylenol and see if makes headache better. Stop wearing new glasses and see if headache gets better. Go back to eye doctor. If headaches are not getting better in 2-3 weeks, let me know, we may need to send you for brain MRI. Cut down to protonix 40 mg once a day. Eventually I would like you to be on protonix 20 mg a day... But we will work our way there. Return in 6 months for follow up or sooner if needed.

## 2015-03-19 NOTE — Progress Notes (Signed)
[facility] [address], [place] [address] [phone]  Date:  03/19/2015   Name:  Kendra Gonzalez   DOB:  [birth year]   MRN:  [id number]  PCP:  Elizabeth Sauer    Chief Complaint: CPE and Medication Refill   History of Present Illness:  This is a 44 y.o. female with PMH benign thyroid nodule, chronic left hip pain, recurrent bv, allergic rhinitis, GERD, hx stomach ulcer, asthma who is presenting for CPE.  Uses albuterol once every 2-3 months. Uses more in summer time. Uses advair daily.  Sees Dr. Aretha Parrot for left hip pain on as needed basis. States she has been told she will eventually need a hip replacement. She was rx'd lyrica in the past but stopped taking even though it did help some.  Uses boric acid twice a week for recurrent vaginitis.  Pt has a hx stomach ulcer, dx'd over a year ago by endoscopy. She has been on protonix 40 mg BID since. No current problems with heartburn or epigastric pain. She stays away from NSAIDs.  Pt complains over new daily headaches over the past 2 months. Started after getting a new glasses rx. She had a dilated eye exam at that time and glasses rx changed. States sometimes she goes to bed with a headache and wakes with a headache. Headache is frontal in location. denies photophobia, n/v, weakness or paresthesias with her headaches. She does get blurry vision and has a hard time focusing. Over the past 2 days she has switched back to her old glasses rx and no longer having blurred vision but headaches remain. She has tried tylenol without much relief.  LMP: 1998 s/p abd hysterectomy, also had 1 oophorectomy. Hysterectomy d/t endometriosis, no malignancy. Some hot flashes but not that often and not too bothersome. Last pap: Dr. Ronita Hipps is GYN, next appt January. Getting an u/s left ovary at that time d/t history ovarian cyst. Vaginal pap in the past few years, unsure of date. Sexual history: sexually active with  husband Immunizations: tdap 2012, declines flu Dentist: regular visits Diet/Exercise: running 5-8 miles a day, 3-4 days a week. Cooks healthy food. Drinks only water. Fam hx: MGF with CAD, PGM dementia and HTN, Father HTN, brother and sister with HTN. No hx DM, CVA, cancers. Tobacco/alcohol/substance use: no/no/no Mammogram: 02/2015 and was normal. Gets done at GYN office.  Review of Systems:  Review of Systems See CMA note  Patient Active Problem List   Diagnosis Date Noted  . Abdominal pain, chronic, right upper quadrant 06/23/2012  . Obesity (BMI 30.0-34.9) 10/12/2011  . CANDIDIASIS, VULVA/VAGINA 02/16/2007  . HYPERLIPIDEMIA 02/16/2007  . ALLERGIC RHINITIS 02/16/2007  . GERD 02/16/2007  . GLUCOSE INTOLERANCE, HX OF 02/16/2007   Prior to Admission medications   Medication Sig Start Date End Date Taking? Authorizing Provider  acetaminophen (TYLENOL) 500 MG tablet Take 1,000 mg by mouth every 6 (six) hours as needed for mild pain or headache.   Yes Historical Provider, MD  albuterol (PROVENTIL HFA;VENTOLIN HFA) 108 (90 BASE) MCG/ACT inhaler Inhale 2 puffs into the lungs every 6 (six) hours as needed for wheezing or shortness of breath. 03/09/14  Yes Thao P Le, DO  Boric Acid POWD Place 600 mg capsule intravaginally once a week: Boric acid suppository 03/09/14  Yes Thao P Le, DO  fluticasone-salmeterol (ADVAIR HFA) 115-21 MCG/ACT inhaler Inhale 2 puffs into the lungs 2 (two) times daily. 03/18/14  Yes Mancel Bale, PA-C  glucosamine-chondroitin 500-400 MG  tablet Take 1 tablet by mouth 3 (three) times daily.   Yes Historical Provider, MD  magnesium gluconate (MAGONATE) 500 MG tablet Take 500 mg by mouth at bedtime.   Yes Historical Provider, MD  pantoprazole (PROTONIX) 40 MG tablet Take 1 tablet (40 mg total) by mouth 2 (two) times daily. 03/09/14  Yes Thao P Le, DO                       levocetirizine (XYZAL) 5 MG tablet Take 1 tablet (5 mg total) by mouth every evening. Patient  not taking: Reported on 10/31/2014 03/09/14  Prn, summer time Thao P Le, DO                    No Known Allergies  Past Surgical History  Procedure Laterality Date  . Cholecystectomy    . Abdominal hysterectomy    . Oophrectomy  2011    secondary to cysts  . Carpel tunnel    . Arthroscopic knee surgery     . Hip surgery    . Ercp  01/08/2012    Procedure: ENDOSCOPIC RETROGRADE CHOLANGIOPANCREATOGRAPHY (ERCP);  Surgeon: Beryle Beams, MD;  Location: [facility] ENDOSCOPY;  Service: Endoscopy;  Laterality: N/A;  . Tubal ligation      Social History  Substance Use Topics  . Smoking status: Never Smoker   . Smokeless tobacco: Never Used  . Alcohol Use: No    Family History  Problem Relation Age of Onset  . Heart disease Daughter   . Hypertension Father   . Hypertension Sister     Medication list has been reviewed and updated.  Physical Examination:  Physical Exam  Constitutional: She is oriented to person, place, and time.  HENT:  Head: Normocephalic and atraumatic.  Right Ear: Hearing, tympanic membrane, external ear and ear canal normal.  Left Ear: Hearing, tympanic membrane, external ear and ear canal normal.  Nose: Nose normal.  Mouth/Throat: Uvula is midline, oropharynx is clear and moist and mucous membranes are normal.  Eyes: Conjunctivae, EOM and lids are normal. Pupils are equal, round, and reactive to light. Right eye exhibits no discharge. Left eye exhibits no discharge. No scleral icterus.  Neck: Trachea normal. Carotid bruit is not present. Thyromegaly (right sided) present.  Cardiovascular: Normal rate, regular rhythm, normal heart sounds, intact distal pulses and normal pulses.   No murmur heard. Pulmonary/Chest: Effort normal and breath sounds normal. She has no wheezes. She has no rhonchi. She has no rales.  Abdominal: Soft. Normal appearance and bowel sounds are normal. She exhibits no abdominal bruit. There is no tenderness.  Musculoskeletal: Normal range of  motion.  Lymphadenopathy:       Head (right side): No submental, no submandibular, no tonsillar, no preauricular, no posterior auricular and no occipital adenopathy present.       Head (left side): No submental, no submandibular, no tonsillar, no preauricular, no posterior auricular and no occipital adenopathy present.    She has no cervical adenopathy.  Neurological: She is alert and oriented to person, place, and time. She has normal strength and normal reflexes. No cranial nerve deficit or sensory deficit. Coordination and gait normal.  Skin: Skin is warm, dry and intact. No lesion and no rash noted.  Psychiatric: She has a normal mood and affect. Her speech is normal and behavior is normal. Thought content normal.   BP 131/83 mmHg  Pulse 66  Temp(Src) 98.4 F (36.9 C) (Oral)  Resp 16  Ht 5' 5.5" (1.664 m)  Wt 197 lb 12.8 oz (89.721 kg)  BMI 32.40 kg/m2  SpO2 93%  LMP 06/06/1996  Assessment and Plan:  1. Annual physical exam Up to date on preventative screening.  See below.  2. Bacterial vaginosis - Boric Acid POWD; Place 600 mg capsule intravaginally once a week: Boric acid suppository  Dispense: 180 g; Refill: 6 - CBC - POCT urinalysis dipstick  3. Asthma, mild persistent, uncomplicated - fluticasone-salmeterol (ADVAIR HFA) 115-21 MCG/ACT inhaler; Inhale 2 puffs into the lungs 2 (two) times daily.  Dispense: 1 Inhaler; Refill: 11 - albuterol (PROVENTIL HFA;VENTOLIN HFA) 108 (90 BASE) MCG/ACT inhaler; Inhale 2 puffs into the lungs every 6 (six) hours as needed for wheezing or shortness of breath.  Dispense: 1 Inhaler; Refill: 6  4. History of stomach ulcers Pt has been on protonix 40 mg BID for over a year. We discussed the importance of being on lowest effective dose. Will decrease to 40 mg QD with goal of decreasing to 20 mg QD if able to tolerate. - pantoprazole (PROTONIX) 40 MG tablet; Take 1 tablet (40 mg total) by mouth daily.  Dispense: 30 tablet; Refill: 5  5. Low  TSH level TSH low 05/2014. She was advised to recheck in 3 months but did not follow up. If TSH still low, will refer to endocrinology. - TSH  6. Lipid screening - Lipid panel  7. Encounter for vitamin deficiency screening - Vitamin D 1,25 dihydroxy  8. Diabetes mellitus screening - Comprehensive metabolic panel  9. New daily persistent headache I suspect new daily headache due to new glasses prescription - headaches coincide with this event. She will switch to old rx and make appt with ophtho for re-evaluation. Pt has tramadol at home that she has not tried yet. She will try with tylenol and see if helpful. If headaches are not improving in 2 weeks, she will call and I will send for MRI brain.  Benjaman Pott Drenda Freeze, MHS [facility]  03/22/2015

## 2015-03-19 NOTE — Progress Notes (Signed)
   Subjective:    Patient ID: Kendra Gonzalez, female    DOB: 07-Nov-1970, 44 y.o.   MRN: [id number]  HPI    Review of Systems  Musculoskeletal: Positive for myalgias.  Allergic/Immunologic: Positive for environmental allergies.       Objective:   Physical Exam        Assessment & Plan:

## 2015-03-22 DIAGNOSIS — Z8711 Personal history of peptic ulcer disease: Secondary | ICD-10-CM | POA: Insufficient documentation

## 2015-03-22 DIAGNOSIS — Z8719 Personal history of other diseases of the digestive system: Secondary | ICD-10-CM | POA: Insufficient documentation

## 2015-03-22 LAB — VITAMIN D 1,25 DIHYDROXY
Vitamin D 1, 25 (OH)2 Total: 60 pg/mL (ref 18–72)
Vitamin D2 1, 25 (OH)2: <8 pg/mL
Vitamin D3 1, 25 (OH)2: 60 pg/mL

## 2015-03-25 ENCOUNTER — Ambulatory Visit (INDEPENDENT_AMBULATORY_CARE_PROVIDER_SITE_OTHER): Payer: Commercial Managed Care - HMO | Admitting: Family Medicine

## 2015-03-25 VITALS — BP 134/86 | HR 91 | Temp 99.3°F | Resp 18 | Ht 66.0 in | Wt 199.0 lb

## 2015-03-25 DIAGNOSIS — R110 Nausea: Secondary | ICD-10-CM | POA: Diagnosis not present

## 2015-03-25 DIAGNOSIS — J029 Acute pharyngitis, unspecified: Secondary | ICD-10-CM | POA: Diagnosis not present

## 2015-03-25 DIAGNOSIS — R1013 Epigastric pain: Secondary | ICD-10-CM | POA: Diagnosis not present

## 2015-03-25 MED ORDER — ONDANSETRON 4 MG PO TBDP
4.0000 mg | ORAL_TABLET | Freq: Once | ORAL | Status: AC
  Administered 2015-03-25: 4 mg via ORAL

## 2015-03-25 MED ORDER — GI COCKTAIL ~~LOC~~
30.0000 mL | Freq: Once | ORAL | Status: AC
  Administered 2015-03-25: 30 mL via ORAL

## 2015-03-25 MED ORDER — ONDANSETRON 8 MG PO TBDP
8.0000 mg | ORAL_TABLET | Freq: Three times a day (TID) | ORAL | Status: DC | PRN
Start: 2015-03-25 — End: 2015-08-26

## 2015-03-25 NOTE — Progress Notes (Signed)
@[facility] @  This chart was scribed for Robyn Haber, MD by Thea Alken, ED Scribe. This patient was seen in room 4 and the patient's care was started at 6:08 PM.  Patient ID: Kendra Gonzalez MRN: [id number], DOB: [birth year], 44 y.o. Date of Encounter: 03/25/2015, 6:29 PM  Primary Physician: Elizabeth Sauer  Chief Complaint:  Chief Complaint  Patient presents with  . Abdominal Pain    x 1 day  . Nausea    today    HPI: 44 y.o. year old femaleale with history below presents with abdminal pain. Pt states symptoms started yesterday with sore throat, sneezing and coughing but worsened today with abdominal pain, nausea, dry heaving fever and chills that started today.  She denies diarrhea and emesis.  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  Past Medical History  Diagnosis Date  . Endometriosis   . Hyperlipidemia   . Kidney stones   . Asthma   . Allergy   . Pancreatitis   . History of tetanus, diphtheria, and acellular pertussis booster vaccination (Tdap)     07/30/2010  . Arthritis   . GERD (gastroesophageal reflux disease)      Home Meds: Prior to Admission medications   Medication Sig Start Date End Date Taking? Authorizing Provider  acetaminophen (TYLENOL) 500 MG tablet Take 1,000 mg by mouth every 6 (six) hours as needed for mild pain or headache.   Yes Historical Provider, MD  albuterol (PROVENTIL HFA;VENTOLIN HFA) 108 (90 BASE) MCG/ACT inhaler Inhale 2 puffs into the lungs every 6 (six) hours as needed for wheezing or shortness of breath. 03/19/15  Yes Ezekiel Slocumb, PA-C  Boric Acid POWD Place 600 mg capsule intravaginally once a week: Boric acid suppository 03/19/15  Yes Bennett Scrape V, PA-C  fluticasone-salmeterol (ADVAIR HFA) 115-21 MCG/ACT inhaler Inhale 2 puffs into the lungs 2 (two) times daily. 03/19/15  Yes Ezekiel Slocumb, PA-C  glucosamine-chondroitin 500-400 MG tablet Take 1 tablet by mouth 3 (three) times daily.   Yes Historical Provider, MD  levocetirizine (XYZAL) 5 MG tablet  Take 1 tablet (5 mg total) by mouth every evening. 03/09/14  Yes Thao P Le, DO  magnesium gluconate (MAGONATE) 500 MG tablet Take 500 mg by mouth at bedtime.   Yes Historical Provider, MD  pantoprazole (PROTONIX) 40 MG tablet Take 1 tablet (40 mg total) by mouth daily. 03/19/15  Yes Ezekiel Slocumb, PA-C    Allergies: No Known Allergies  Social History   Social History  . Marital Status: Married    Spouse Name: N/A  . Number of Children: N/A  . Years of Education: N/A   Occupational History  . Not on file.   Social History Main Topics  . Smoking status: Never Smoker   . Smokeless tobacco: Never Used  . Alcohol Use: No  . Drug Use: No  . Sexual Activity: Yes    Birth Control/ Protection: None   Other Topics Concern  . Not on file   Social History Narrative   Works as a Education officer, environmental.  Married with 53 child, 64 year old son.    Review of Systems: Constitutional: negative for chills, fever, night sweats, weight changes, or fatigue  HEENT: negative for vision changes, hearing loss, congestion, rhinorrhea, ST, epistaxis, or sinus pressure Cardiovascular: negative for chest pain or palpitations Respiratory: negative for hemoptysis, wheezing, shortness of breath. Abdominal: negative for  vomiting, diarrhea, or constipation Dermatological: negative for rash Neurologic: negative for headache, dizziness, or syncope All other systems  reviewed and are otherwise negative with the exception to those above and in the HPI.   Physical Exam: Blood pressure 134/86, pulse 91, temperature 99.3 F (37.4 C), resp. rate 18, height 5\' 6"  (1.676 m), weight 199 lb (90.266 kg), last menstrual period 06/06/1996, SpO2 96 %., Body mass index is 32.13 kg/(m^2). General: Well developed, well nourished, in no acute distress. Head: Normocephalic, atraumatic, eyes without discharge, sclera non-icteric, nares are without discharge. Bilateral auditory canals clear, TM's are without perforation, pearly  grey and translucent with reflective cone of light bilaterally. Oral cavity moist, posterior pharynx without exudate, erythema, peritonsillar abscess, or post nasal drip.  Neck: Supple. No thyromegaly. Full ROM. No lymphadenopathy. Lungs: Clear bilaterally to auscultation without wheezes, rales, or rhonchi. Breathing is unlabored. Heart: RRR with S1 S2. No murmurs, rubs, or gallops appreciated. Abdomen: Soft, non-tender, non-distended with normoactive bowel sounds. No hepatomegaly. No rebound/guarding. No obvious abdominal masses. Msk:  Strength and tone normal for age. Extremities/Skin: Warm and dry. No clubbing or cyanosis. No edema. No rashes or suspicious lesions. Neuro: Alert and oriented X 3. Moves all extremities spontaneously. Gait is normal. CNII-XII grossly in tact. Psych:  Responds to questions appropriately with a normal affect.    ASSESSMENT AND PLAN:  44 y.o. year old female with      ICD-9-CM ICD-10-CM   1. Abdominal pain, epigastric 789.06 R10.13 gi cocktail (Maalox,Lidocaine,Donnatal)     ondansetron (ZOFRAN-ODT) disintegrating tablet 4 mg     ondansetron (ZOFRAN-ODT) 8 MG disintegrating tablet  2. Sore throat 462 J02.9   3. Nausea without vomiting 787.02 R11.0 ondansetron (ZOFRAN-ODT) 8 MG disintegrating tablet   By signing my name below, I, Raven Small, attest that this documentation has been prepared under the direction and in the presence of Robyn Haber, MD.  Electronically Signed: Thea Alken, ED Scribe. 03/25/2015. 6:29 PM.  Signed, Robyn Haber, MD 03/25/2015 6:29 PM

## 2015-03-25 NOTE — Patient Instructions (Signed)
Clear liquids only tonight Tylenol for fever Zofran for nausea

## 2015-03-26 ENCOUNTER — Telehealth: Payer: Self-pay | Admitting: *Deleted

## 2015-03-26 NOTE — Telephone Encounter (Signed)
-----   Message from Ezekiel Slocumb, [place] sent at 03/23/2015  3:26 PM EST ----- Please call pt and let her know her results. 1. Vit D normal 2. Thyroid hormone is still low, meaning that the thyroid is overactive. I am going to refer her to an endocrinologist for further evaluation. 3. Kidney and liver function normal 4. No evidence of diabetes 5. Cholesterol looks good - her bad cholesterol is a [place] worse than a year ago but her good cholesterol is much higher than 1 year ago which is great! She should continue diet and exercise like she's been doing.  6. Not anemic  Please mail letter with results as well. Also, please ask her how her headaches are doing and let me know.

## 2015-03-26 NOTE — Telephone Encounter (Signed)
Notes Recorded by Constance Goltz, CMA on 03/25/2015 at 9:24 AM Pt notified. HA's are about the same.

## 2015-04-03 ENCOUNTER — Telehealth: Payer: Self-pay | Admitting: Physician Assistant

## 2015-04-03 DIAGNOSIS — R7989 Other specified abnormal findings of blood chemistry: Secondary | ICD-10-CM

## 2015-04-03 NOTE — Telephone Encounter (Signed)
Spoke with pt. She states her headaches have resolved. She is wearing old rx glasses. She has made appt with ophtho. We do not need to proceed with imaging at this time. Forgot to make referral to endo. Will place today.

## 2015-04-16 ENCOUNTER — Ambulatory Visit (INDEPENDENT_AMBULATORY_CARE_PROVIDER_SITE_OTHER): Payer: 59 | Admitting: Endocrinology

## 2015-04-16 ENCOUNTER — Encounter: Payer: Self-pay | Admitting: Endocrinology

## 2015-04-16 VITALS — BP 122/80 | HR 70 | Temp 98.3°F | Ht 66.0 in | Wt 201.0 lb

## 2015-04-16 DIAGNOSIS — E042 Nontoxic multinodular goiter: Secondary | ICD-10-CM

## 2015-04-16 MED ORDER — METHIMAZOLE 5 MG PO TABS: 5.0000 mg | ORAL_TABLET | ORAL | Status: DC

## 2015-04-16 NOTE — Progress Notes (Signed)
Subjective:    Patient ID: Kendra Gonzalez, female    DOB: 1971/01/12, 44 y.o.   MRN: [id number]  HPI In early 2016, pt reports she was noted to have slight swelling at the anterior neck, but no assoc pain.  She had bilat bxs Hospital doctor. Cat. 2).   she has never been on therapy for this.  she has never had XRT to the anterior neck, or thyroid surgery.  she does not consume kelp or any other prescribed or non-prescribed thyroid medication.  she has never been on amiodarone.   Past Medical History  Diagnosis Date  . Endometriosis   . Hyperlipidemia   . Kidney stones   . Asthma   . Allergy   . Pancreatitis   . History of tetanus, diphtheria, and acellular pertussis booster vaccination (Tdap)     07/30/2010  . Arthritis   . GERD (gastroesophageal reflux disease)     Past Surgical History  Procedure Laterality Date  . Cholecystectomy    . Abdominal hysterectomy    . Oophrectomy  2011    secondary to cysts  . Carpel tunnel    . Arthroscopic knee surgery     . Hip surgery    . Ercp  01/08/2012    Procedure: ENDOSCOPIC RETROGRADE CHOLANGIOPANCREATOGRAPHY (ERCP);  Surgeon: Beryle Beams, MD;  Location: [facility] END[facility]COPY;  Service: Endoscopy;  Laterality: N/A;  . Tubal ligation      Social History   Social History  . Marital Status: Married    Spouse Name: N/A  . Number of Children: N/A  . Years of Education: N/A   Occupational History  . Not on file.   Social History Main Topics  . Smoking status: Never Smoker   . Smokeless tobacco: Never Used  . Alcohol Use: No  . Drug Use: No  . Sexual Activity: Yes    Birth Control/ Protection: None   Other Topics Concern  . Not on file   Social History Narrative   Works as a Education officer, environmental.  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    Current Outpatient Prescriptions on File Prior to Visit  Medication Sig Dispense Refill  . acetaminophen (TYLENOL) 500 MG tablet Take 1,000 mg by mouth every 6 (six) hours as needed for mild pain  or headache.    . albuterol (PROVENTIL HFA;VENTOLIN HFA) 108 (90 BASE) MCG/ACT inhaler Inhale 2 puffs into the lungs every 6 (six) hours as needed for wheezing or shortness of breath. 1 Inhaler 6  . Boric Acid POWD Place 600 mg capsule intravaginally once a week: Boric acid suppository 180 g 6  . fluticasone-salmeterol (ADVAIR HFA) 115-21 MCG/ACT inhaler Inhale 2 puffs into the lungs 2 (two) times daily. 1 Inhaler 11  . glucosamine-chondroitin 500-400 MG tablet Take 1 tablet by mouth 3 (three) times daily.    Marland Kitchen levocetirizine (XYZAL) 5 MG tablet Take 1 tablet (5 mg total) by mouth every evening. 30 tablet 6  . magnesium gluconate (MAGONATE) 500 MG tablet Take 500 mg by mouth at bedtime.    . pantoprazole (PROTONIX) 40 MG tablet Take 1 tablet (40 mg total) by mouth daily. 30 tablet 5  . ondansetron (ZOFRAN-ODT) 8 MG disintegrating tablet Take 1 tablet (8 mg total) by mouth every 8 (eight) hours as needed for nausea. (Patient not taking: Reported on 04/16/2015) 10 tablet 0   No current facility-administered medications on file prior to visit.    No Known Allergies  Family History  Problem Relation Age of Onset  . Heart disease Daughter   . Hypertension Father   . Hypertension Sister   . Hypertension Brother   . Hypertension Maternal Grandfather   . Heart disease Maternal Grandfather   . Hypertension Paternal Grandfather   . Hypertension Brother   . Hypertension Sister   . Thyroid disease Neg Hx     BP 122/80 mmHg  Pulse 70  Temp(Src) 98.3 F (36.8 C) (Oral)  Ht 5\' 6"  (1.676 m)  Wt 201 lb (91.173 kg)  BMI 32.46 kg/m2  SpO2 99%  LMP 06/06/1996  Review of Systems denies visual loss, palpitations, sob, diarrhea, polyuria, muscle weakness, excessive diaphoresis, heat intolerance, easy bruising, and rhinorrhea. She has intermittent hoarseness, weight gain, chronic headache, anxiety, and slight tremor.      Objective:   Physical Exam VS: see vs page GEN: no distress HEAD: head:  no deformity eyes: no periorbital swelling, no proptosis external nose and ears are normal mouth: no lesion seen NECK: thyroid is not enlarged.  I cannot appreciate the nodules.   CHEST WALL: no deformity LUNGS:  Clear to auscultation CV: reg rate and rhythm, no murmur ABD: abdomen is soft, nontender.  no hepatosplenomegaly.  not distended.  no hernia MUSCULOSKELETAL: muscle bulk and strength are grossly normal.  no obvious joint swelling.  gait is normal and steady EXTEMITIES: no deformity.  no edema.  PULSES: no carotid bruit NEURO:  cn 2-12 grossly intact.   readily moves all 4's.  sensation is intact to touch on all 4's SKIN:  Normal texture and temperature.  No rash or suspicious lesion is visible.   NODES:  None palpable at the neck PSYCH: alert, well-oriented.  Does not appear anxious nor depressed.    Lab Results  Component Value Date   TSH 0.288* 03/19/2015   [place]: Multinodular goiter.    I have reviewed outside records, and summarized: Pt was noted to have suppressed TSH, and referred here.  I reviewed cytology reports.    Assessment & Plan:  Multinodular goiter, new to me, usually hereditary.  hyperthyroidism, new.  Given it is mild, the goiter is probably due to hyperfunctioning adenomas, which have a very low risk of malignancy.  tremor and other sxs: I discussed with patient the fact that these may not be thyroid-related, but she requests rx--ok with me.   Patient is advised the following: Patient Instructions  i have sent a prescription to your pharmacy, to slow down the thyroid. if ever you have fever while taking methimazole, stop it and call us, even if the reason is obvious, because of the risk of a rare side-effect.  Please come back for a follow-up appointment in 6 weeks. We can reconsider the radioactive iodine in the future if you wish.

## 2015-04-16 NOTE — Patient Instructions (Addendum)
i have sent a prescription to your pharmacy, to slow down the thyroid. if ever you have fever while taking methimazole, stop it and call us, even if the reason is obvious, because of the risk of a rare side-effect.  Please come back for a follow-up appointment in 6 weeks. We can reconsider the radioactive iodine in the future if you wish.

## 2015-04-17 DIAGNOSIS — E042 Nontoxic multinodular goiter: Secondary | ICD-10-CM | POA: Insufficient documentation

## 2015-05-24 ENCOUNTER — Ambulatory Visit (INDEPENDENT_AMBULATORY_CARE_PROVIDER_SITE_OTHER): Payer: Commercial Managed Care - HMO | Admitting: Physician Assistant

## 2015-05-24 VITALS — BP 124/78 | HR 90 | Temp 98.4°F | Resp 14 | Ht 66.0 in | Wt 202.0 lb

## 2015-05-24 DIAGNOSIS — J069 Acute upper respiratory infection, unspecified: Secondary | ICD-10-CM | POA: Diagnosis not present

## 2015-05-24 MED ORDER — CETIRIZINE-PSEUDOEPHEDRINE ER 5-120 MG PO TB12: 1.0000 | ORAL_TABLET | Freq: Two times a day (BID) | ORAL | Status: DC

## 2015-05-24 MED ORDER — IBUPROFEN 600 MG PO TABS: 600.0000 mg | ORAL_TABLET | Freq: Four times a day (QID) | ORAL | Status: DC | PRN

## 2015-05-24 NOTE — Progress Notes (Signed)
05/24/2015 5:51 PM   DOB: March 31, 1971 / MRN: [id number]  SUBJECTIVE:  Kendra Gonzalez is a 45 y.o. female presenting for for the evaluation of sore throat that started 1 days ago.  Associated symptoms include cough and sneezing today and she denies fever, headache and jaw pain. Treatments tried thus far include Mucinex with poor relief. She reports sick contacts.  She has No Known Allergies.   She  has a past medical history of Endometriosis; Hyperlipidemia; Kidney stones; Asthma; Allergy; Pancreatitis; History of tetanus, diphtheria, and acellular pertussis booster vaccination (Tdap); Arthritis; and GERD (gastroesophageal reflux disease).    She  reports that she has never smoked. She has never used smokeless tobacco. She reports that she does not drink alcohol or use illicit drugs. She  reports that she currently engages in sexual activity. She reports using the following method of birth control/protection: None. The patient  has past surgical history that includes Cholecystectomy; Abdominal hysterectomy; oophrectomy (2011); carpel tunnel; arthroscopic knee surgery ; Hip surgery; ERCP (01/08/2012); and Tubal ligation.  Her family history includes Heart disease in her daughter and maternal grandfather; Hypertension in her brother, brother, father, maternal grandfather, paternal grandfather, sister, and sister. There is no history of Thyroid disease.  Review of Systems  Constitutional: Positive for malaise/fatigue. Negative for fever, chills and diaphoresis.  HENT: Positive for congestion and sore throat.   Respiratory: Positive for cough. Negative for hemoptysis, shortness of breath and wheezing.   Cardiovascular: Negative for chest pain.  Gastrointestinal: Negative for nausea.  Skin: Negative for rash.  Neurological: Negative for dizziness and weakness.  Endo/Heme/Allergies: Negative for polydipsia.    Problem list and medications reviewed and updated by myself where necessary, and exist  elsewhere in the encounter.   OBJECTIVE:  BP 124/78 mmHg  Pulse 90  Temp(Src) 98.4 F (36.9 C) (Oral)  Resp 14  Ht 5\' 6"  (1.676 m)  Wt 202 lb (91.627 kg)  BMI 32.62 kg/m2  SpO2 98%  LMP 06/06/1996  Physical Exam  Constitutional: She is oriented to person, place, and time. She appears well-nourished. No distress.  HENT:  Right Ear: External ear normal.  Left Ear: External ear normal.  Nose: Mucosal edema present. Right sinus exhibits no maxillary sinus tenderness and no frontal sinus tenderness. Left sinus exhibits no maxillary sinus tenderness and no frontal sinus tenderness.  Mouth/Throat: Oropharynx is clear and moist. No oropharyngeal exudate.  Eyes: Conjunctivae and EOM are normal. Pupils are equal, round, and reactive to light.  Cardiovascular: Normal rate, regular rhythm and normal heart sounds.   Pulmonary/Chest: Effort normal and breath sounds normal. She has no rales.  Abdominal: She exhibits no distension.  Neurological: She is alert and oriented to person, place, and time. No cranial nerve deficit. Gait normal.  Skin: Skin is warm and dry. No rash noted. She is not diaphoretic. No erythema.  Psychiatric: She has a normal mood and affect. Her behavior is normal.  Vitals reviewed.   No results found for this or any previous visit (from the past 48 hour(s)).  ASSESSMENT AND PLAN:  Adwita was seen today for sore throat and cough.  Diagnoses and all orders for this visit:  Viral URI: No alarm features. Will treat symptomatically for now.  RTC for fever, chills, SOB, new DOE.    Other orders -     ibuprofen (ADVIL,MOTRIN) 600 MG tablet; Take 1 tablet (600 mg total) by mouth every 6 (six) hours as needed. -     cetirizine-pseudoephedrine (ZYRTEC-D ALLERGY &  CONGESTION) 5-120 MG tablet; Take 1 tablet by mouth 2 (two) times daily.   The patient was advised to call or return to clinic if she does not see an improvement in symptoms or to seek the care of the closest  emergency department if she worsens with the above plan.   Philis Fendt, MHS, PA-C [facility] 05/24/2015 5:51 PM

## 2015-05-25 ENCOUNTER — Encounter (HOSPITAL_COMMUNITY): Payer: Self-pay | Admitting: Emergency Medicine

## 2015-05-25 ENCOUNTER — Emergency Department (HOSPITAL_COMMUNITY): Payer: Commercial Managed Care - HMO

## 2015-05-25 ENCOUNTER — Emergency Department (HOSPITAL_COMMUNITY)
Admission: EM | Admit: 2015-05-25 | Discharge: 2015-05-25 | Disposition: A | Discharge status: Home / Self Care | Payer: Commercial Managed Care - HMO | Attending: Emergency Medicine | Admitting: Emergency Medicine

## 2015-05-25 DIAGNOSIS — Z8742 Personal history of other diseases of the female genital tract: Secondary | ICD-10-CM | POA: Insufficient documentation

## 2015-05-25 DIAGNOSIS — M1990 Unspecified osteoarthritis, unspecified site: Secondary | ICD-10-CM | POA: Diagnosis not present

## 2015-05-25 DIAGNOSIS — Z79899 Other long term (current) drug therapy: Secondary | ICD-10-CM | POA: Insufficient documentation

## 2015-05-25 DIAGNOSIS — R110 Nausea: Secondary | ICD-10-CM | POA: Insufficient documentation

## 2015-05-25 DIAGNOSIS — E785 Hyperlipidemia, unspecified: Secondary | ICD-10-CM | POA: Insufficient documentation

## 2015-05-25 DIAGNOSIS — R1013 Epigastric pain: Secondary | ICD-10-CM | POA: Insufficient documentation

## 2015-05-25 DIAGNOSIS — J45909 Unspecified asthma, uncomplicated: Secondary | ICD-10-CM | POA: Insufficient documentation

## 2015-05-25 DIAGNOSIS — Z9049 Acquired absence of other specified parts of digestive tract: Secondary | ICD-10-CM | POA: Insufficient documentation

## 2015-05-25 DIAGNOSIS — Z87442 Personal history of urinary calculi: Secondary | ICD-10-CM | POA: Diagnosis not present

## 2015-05-25 DIAGNOSIS — K219 Gastro-esophageal reflux disease without esophagitis: Secondary | ICD-10-CM | POA: Insufficient documentation

## 2015-05-25 DIAGNOSIS — R109 Unspecified abdominal pain: Secondary | ICD-10-CM

## 2015-05-25 LAB — HEPATIC FUNCTION PANEL
ALBUMIN: 4.4 g/dL (ref 3.5–5.0)
ALK PHOS: 64 U/L (ref 38–126)
ALT: 16 U/L (ref 14–54)
AST: 22 U/L (ref 15–41)
Bilirubin, Direct: <0.1 mg/dL — ABNORMAL LOW (ref 0.1–0.5)
TOTAL PROTEIN: 8 g/dL (ref 6.5–8.1)
Total Bilirubin: 0.7 mg/dL (ref 0.3–1.2)

## 2015-05-25 LAB — URINALYSIS, ROUTINE W REFLEX MICROSCOPIC
Bilirubin Urine: NEGATIVE
Glucose, UA: NEGATIVE mg/dL
Hgb urine dipstick: NEGATIVE
KETONES UR: NEGATIVE mg/dL
LEUKOCYTES UA: NEGATIVE
NITRITE: NEGATIVE
PH: 8 (ref 5.0–8.0)
PROTEIN: NEGATIVE mg/dL
Specific Gravity, Urine: 1.015 (ref 1.005–1.030)

## 2015-05-25 LAB — BASIC METABOLIC PANEL
Anion gap: 9 (ref 5–15)
BUN: 7 mg/dL (ref 6–20)
CALCIUM: 10.2 mg/dL (ref 8.9–10.3)
CO2: 26 mmol/L (ref 22–32)
Chloride: 105 mmol/L (ref 101–111)
Creatinine, Ser: 0.89 mg/dL (ref 0.44–1.00)
GFR calc Af Amer: >60 mL/min (ref >60)
GLUCOSE: 98 mg/dL (ref 65–99)
POTASSIUM: 4.3 mmol/L (ref 3.5–5.1)
SODIUM: 140 mmol/L (ref 135–145)

## 2015-05-25 LAB — CBC
HCT: 39.1 % (ref 36.0–46.0)
Hemoglobin: 13.5 g/dL (ref 12.0–15.0)
MCH: 30.3 pg (ref 26.0–34.0)
MCHC: 34.5 g/dL (ref 30.0–36.0)
MCV: 87.9 fL (ref 78.0–100.0)
PLATELETS: 275 10*3/uL (ref 150–400)
RBC: 4.45 MIL/uL (ref 3.87–5.11)
RDW: 13 % (ref 11.5–15.5)
WBC: 8.5 10*3/uL (ref 4.0–10.5)

## 2015-05-25 LAB — I-STAT CG4 LACTIC ACID, ED: Lactic Acid, Venous: 1.24 mmol/L (ref 0.5–2.0)

## 2015-05-25 LAB — LIPASE, BLOOD: Lipase: 23 U/L (ref 11–51)

## 2015-05-25 MED ORDER — ACETAMINOPHEN 325 MG PO TABS
650.0000 mg | ORAL_TABLET | Freq: Once | ORAL | Status: AC
  Administered 2015-05-25: 650 mg via ORAL
  Filled 2015-05-25: qty 2

## 2015-05-25 MED ORDER — ONDANSETRON HCL 4 MG PO TABS: 4.0000 mg | ORAL_TABLET | Freq: Four times a day (QID) | ORAL | Status: DC

## 2015-05-25 MED ORDER — MORPHINE SULFATE (PF) 4 MG/ML IV SOLN
4.0000 mg | Freq: Once | INTRAVENOUS | Status: AC
  Administered 2015-05-25: 4 mg via INTRAVENOUS
  Filled 2015-05-25: qty 1

## 2015-05-25 MED ORDER — SODIUM CHLORIDE 0.9 % IV BOLUS (SEPSIS)
500.0000 mL | Freq: Once | INTRAVENOUS | Status: AC
  Administered 2015-05-25: 500 mL via INTRAVENOUS

## 2015-05-25 MED ORDER — IOHEXOL 300 MG/ML  SOLN
25.0000 mL | Freq: Once | INTRAMUSCULAR | Status: AC | PRN
  Administered 2015-05-25: 25 mL via ORAL

## 2015-05-25 MED ORDER — GI COCKTAIL ~~LOC~~
30.0000 mL | Freq: Once | ORAL | Status: AC
  Administered 2015-05-25: 30 mL via ORAL
  Filled 2015-05-25: qty 30

## 2015-05-25 MED ORDER — DICYCLOMINE HCL 20 MG PO TABS: 20.0000 mg | ORAL_TABLET | Freq: Two times a day (BID) | ORAL | Status: DC

## 2015-05-25 MED ORDER — ONDANSETRON HCL 4 MG/2ML IJ SOLN
4.0000 mg | Freq: Once | INTRAMUSCULAR | Status: AC
  Administered 2015-05-25: 4 mg via INTRAVENOUS
  Filled 2015-05-25: qty 2

## 2015-05-25 MED ORDER — IOHEXOL 300 MG/ML  SOLN
100.0000 mL | Freq: Once | INTRAMUSCULAR | Status: AC | PRN
  Administered 2015-05-25: 100 mL via INTRAVENOUS

## 2015-05-25 NOTE — ED Notes (Signed)
Pt began gagging.  PA-C informed

## 2015-05-25 NOTE — Discharge Instructions (Signed)
Please read and follow all provided instructions.  Your diagnoses today include:  1. Abdominal pain, unspecified abdominal location    Tests performed today include:  Blood counts and electrolytes  Blood tests to check liver and kidney function  Blood tests to check pancreas function  Urine test to look for infection and pregnancy (in women)  Vital signs. See below for your results today.   Medications prescribed:   Take any prescribed medications only as directed.  Home care instructions:   Follow any educational materials contained in this packet.  Follow-up instructions: Please follow-up with your primary care provider in the next 2 days for further evaluation of your symptoms.    Return instructions:  SEEK IMMEDIATE MEDICAL ATTENTION IF:  The pain does not go away or becomes severe   A temperature above 101F develops   Repeated vomiting occurs (multiple episodes)   The pain becomes localized to portions of the abdomen. The right side could possibly be appendicitis. In an adult, the left lower portion of the abdomen could be colitis or diverticulitis.   Blood is being passed in stools or vomit (bright red or black tarry stools)   You develop chest pain, difficulty breathing, dizziness or fainting, or become confused, poorly responsive, or inconsolable (young children)  If you have any other emergent concerns regarding your health  Additional Information: Abdominal (belly) pain can be caused by many things. Your caregiver performed an examination and possibly ordered blood/urine tests and imaging (CT scan, x-rays, ultrasound). Many cases can be observed and treated at home after initial evaluation in the emergency department. Even though you are being discharged home, abdominal pain can be unpredictable. Therefore, you need a repeated exam if your pain does not resolve, returns, or worsens. Most patients with abdominal pain don't have to be admitted to the hospital or  have surgery, but serious problems like appendicitis and gallbladder attacks can start out as nonspecific pain. Many abdominal conditions cannot be diagnosed in one visit, so follow-up evaluations are very important.  Your vital signs today were: BP 151/92 mmHg   Pulse 116   Temp(Src) 98.6 F (37 C) (Oral)   Resp 22   SpO2 100%   LMP 06/06/1996 If your blood pressure (bp) was elevated above 135/85 this visit, please have this repeated by your doctor within one month. --------------

## 2015-05-25 NOTE — ED Provider Notes (Signed)
CSN: [id number]     Arrival date & time 05/25/15  1227 History   First MD Initiated Contact with Patient 05/25/15 1305     Chief Complaint  Patient presents with  . Abdominal Pain  . Nausea   (Consider location/radiation/quality/duration/timing/severity/associated sxs/prior Treatment) HPI 45 y.o. female with a hx of HLD, GERD, Pancreatitis in 2013, presents to the Emergency Department today complaining of epigastric pain since this morning with associated nausea, but no vomiting. States that the pain is a dull ache that is intermittent 6/10 normally, but can be 10/10. No fevers. No diarrhea. No CP/SOB. No dysuria. No vaginal bleeding/discharge. She was seen in UC yesterday for similar complaint in addition to Sore Throat. Given a GI cocktail with minimal relief. Pt does not have a gallbladder. No other symptoms noted.  Hx of Pancreatitis was ERCP induced due to abnormal LFTs in 2013.     Past Medical History  Diagnosis Date  . Endometriosis   . Hyperlipidemia   . Kidney stones   . Asthma   . Allergy   . Pancreatitis   . History of tetanus, diphtheria, and acellular pertussis booster vaccination (Tdap)     07/30/2010  . Arthritis   . GERD (gastroesophageal reflux disease)    Past Surgical History  Procedure Laterality Date  . Cholecystectomy    . Abdominal hysterectomy    . Oophrectomy  2011    secondary to cysts  . Carpel tunnel    . Arthroscopic knee surgery     . Hip surgery    . Ercp  01/08/2012    Procedure: ENDOSCOPIC RETROGRADE CHOLANGIOPANCREATOGRAPHY (ERCP);  Surgeon: Beryle Beams, MD;  Location: [facility] ENDOSCOPY;  Service: Endoscopy;  Laterality: N/A;  . Tubal ligation     Family History  Problem Relation Age of Onset  . Heart disease Daughter   . Hypertension Father   . Hypertension Sister   . Hypertension Brother   . Hypertension Maternal Grandfather   . Heart disease Maternal Grandfather   . Hypertension Paternal Grandfather   . Hypertension Brother   .  Hypertension Sister   . Thyroid disease Neg Hx    Social History  Substance Use Topics  . Smoking status: Never Smoker   . Smokeless tobacco: Never Used  . Alcohol Use: No   OB History    No data available     Review of Systems ROS reviewed and all are negative for acute change except as noted in the HPI.  Allergies  Review of patient's allergies indicates no known allergies.  Home Medications   Prior to Admission medications   Medication Sig Start Date End Date Taking? Authorizing Provider  acetaminophen (TYLENOL) 500 MG tablet Take 1,000 mg by mouth every 6 (six) hours as needed for mild pain or headache.   Yes Historical Provider, MD  albuterol (PROVENTIL HFA;VENTOLIN HFA) 108 (90 BASE) MCG/ACT inhaler Inhale 2 puffs into the lungs every 6 (six) hours as needed for wheezing or shortness of breath. 03/19/15  Yes Bennett Scrape V, PA-C  Boric Acid POWD Place 600 mg capsule intravaginally once a week: Boric acid suppository Patient taking differently: Place 600 mg vaginally as directed. Place 600 mg capsule intravaginally once a week as needed for irritation: Boric acid suppository 03/19/15  Yes Bennett Scrape V, PA-C  fluticasone-salmeterol (ADVAIR HFA) 115-21 MCG/ACT inhaler Inhale 2 puffs into the lungs 2 (two) times daily. Patient taking differently: Inhale 2 puffs into the lungs 2 (two) times daily as needed (COPD).  03/19/15  Yes Ezekiel Slocumb, PA-C  glucosamine-chondroitin 500-400 MG tablet Take 1 tablet by mouth 3 (three) times daily as needed (joint pain).    Yes Historical Provider, MD  levocetirizine (XYZAL) 5 MG tablet Take 1 tablet (5 mg total) by mouth every evening. Patient taking differently: Take 5 mg by mouth daily as needed for allergies.  03/09/14  Yes Thao P Le, DO  magnesium gluconate (MAGONATE) 500 MG tablet Take 500 mg by mouth at bedtime.   Yes Historical Provider, MD  methimazole (TAPAZOLE) 5 MG tablet Take 1 tablet (5 mg total) by mouth 3 (three) times a week.  04/16/15  Yes Renato Shin, MD  Multiple Vitamins-Minerals (MULTIVITAMIN WITH MINERALS) tablet Take 1 tablet by mouth daily.   Yes Historical Provider, MD  Omega-3 Fatty Acids (FISH OIL PO) Take 1 capsule by mouth daily.   Yes Historical Provider, MD  pantoprazole (PROTONIX) 40 MG tablet Take 1 tablet (40 mg total) by mouth daily. Patient taking differently: Take 40 mg by mouth daily as needed (heartburn).  03/19/15  Yes Bennett Scrape V, PA-C  traMADol (ULTRAM) 50 MG tablet Take 50 mg by mouth every 6 (six) hours as needed. pain 03/29/15  Yes Historical Provider, MD  vitamin C (ASCORBIC ACID) 500 MG tablet Take 500 mg by mouth daily.   Yes Historical Provider, MD  cetirizine-pseudoephedrine (ZYRTEC-D ALLERGY & CONGESTION) 5-120 MG tablet Take 1 tablet by mouth 2 (two) times daily. Patient not taking: Reported on 05/25/2015 05/24/15   Tereasa Coop, PA-C  ibuprofen (ADVIL,MOTRIN) 600 MG tablet Take 1 tablet (600 mg total) by mouth every 6 (six) hours as needed. Patient not taking: Reported on 05/25/2015 05/24/15   Tereasa Coop, PA-C  ondansetron (ZOFRAN-ODT) 8 MG disintegrating tablet Take 1 tablet (8 mg total) by mouth every 8 (eight) hours as needed for nausea. Patient not taking: Reported on 04/16/2015 03/25/15   Robyn Haber, MD   BP 151/92 mmHg  Pulse 116  Temp(Src) 98.6 F (37 C) (Oral)  Resp 22  SpO2 100%  LMP 06/06/1996   Physical Exam  Constitutional: She is oriented to person, place, and time. She appears well-developed and well-nourished.  HENT:  Head: Normocephalic and atraumatic.  Eyes: EOM are normal.  Neck: Normal range of motion. Neck supple.  Cardiovascular: Normal rate, regular rhythm and normal heart sounds.   Pulmonary/Chest: Effort normal and breath sounds normal.  Abdominal: Soft. Normal appearance and bowel sounds are normal. She exhibits no distension and no ascites. There is tenderness in the epigastric area. There is no rigidity, no rebound, no guarding, no CVA  tenderness, no tenderness at McBurney's point and negative Murphy's sign.  Musculoskeletal: Normal range of motion.  Neurological: She is alert and oriented to person, place, and time.  Skin: Skin is warm and dry.  Psychiatric: She has a normal mood and affect. Her behavior is normal. Thought content normal.  Nursing note and vitals reviewed.  ED Course  Procedures (including critical care time) Labs Review Labs Reviewed  CBC  BASIC METABOLIC PANEL  LIPASE, BLOOD  URINALYSIS, ROUTINE W REFLEX MICROSCOPIC (NOT AT [facility])   Imaging Review No results found. I have personally reviewed and evaluated these images and lab results as part of my medical decision-making.   EKG Interpretation None      MDM  I have reviewed relevant laboratory values. I have reviewed the relevant previous healthcare records. I obtained HPI from historian. Patient discussed with supervising physician  ED Course:  Assessment: 55y F presents with epigastric pain since this morning. Seen [facility] for same complaint and given GI cocktail with minimal relief. Will evaluate CBC/BMP/Lipase. Pt has had cholecystectomy. Pancreatitis in the past was due to ERCP from abnormal LFTs in 2013. While in ED, pt developed fever (101.2). No Leukocytosis. Treated with Tylenol. She does not have SOB, Lungs are CTA. She is not hypoxic. Otherwise in NAD. Pt able to ambulate without difficulty. iStat Lactic Acid 1.24. Will CT ABD/Pelvis for further evaluation. If negative, tachycardia and temp most likely due to previous viral URI diagnosed based on UC note and visit yesterday. I will dc with follow up to PCP for further management and possible GI referral. Given GI cocktail. Most likely symptoms are viral syndrome from past URI.   Disposition/Plan:  If neg CT, DC Home Additional Verbal discharge instructions given and discussed with patient.  Pt Instructed to f/u with PCP in the next 48-72 hours for evaluation and treatment of  symptoms. Return precautions given Pt acknowledges and agrees with plan  4:30 PM- Sign Out to Amie Portland PA,-C   Supervising Physician Deno Etienne, DO   Final diagnoses:  Abdominal pain, unspecified abdominal location        Shary Decamp, PA-C 05/25/15 [place], DO 05/26/15 [phone]

## 2015-05-25 NOTE — ED Notes (Signed)
Patient here with complaints of abd pain that started this morning. Nausea no vomiting. Hx of same.

## 2015-05-25 NOTE — ED Notes (Signed)
Patient transported to CT 

## 2015-05-25 NOTE — ED Provider Notes (Signed)
Patient care assumed from Shary Decamp, PA-C at shift change. For full HPI, please see provider's note.  In short, pt presented with epigastric pain x 1 day with associated nausea. No other associated symptoms. Hx of cholecystectomy and ERCP induced pancreatitis. Blood work unremarkable. Symptoms treated with IVF, zofran, morphine and GI cocktail. Patient care signed out pending abdominal CT results.  Abdominal CT results show mild biliary prominence which may be related to cholecystectomy. Radiologist recommends LFTs for correlation.   LFTs unremarkable. Pt reports improvement in symptoms on re-evaluation. She states she is ready to go home. Repeat abdominal exam soft without tenderness or peritoneal signs. Will discharge with zofran and instruction to follow up with her PCP. Return precautions given in discharge paperwork and discussed with pt at bedside. Pt stable for discharge  Filed Vitals:   05/25/15 1236 05/25/15 1459 05/25/15 1511 05/25/15 1737  BP: 151/92 149/98    Pulse: 116 105    Temp: 98.6 F (37 C)  101.2 F (38.4 C) 99.7 F (37.6 C)  TempSrc: Oral  Oral Oral  Resp: 22 18    SpO2: 100% 99%     Recent Results (from the past 2160 hour(s))  CBC     Status: None   Collection Time: 03/19/15  3:18 PM  Result Value Ref Range   WBC 7.8 4.0 - 10.5 K/uL   RBC 4.62 3.87 - 5.11 MIL/uL   Hemoglobin 13.8 12.0 - 15.0 g/dL   HCT 40.7 36.0 - 46.0 %   MCV 88.1 78.0 - 100.0 fL   MCH 29.9 26.0 - 34.0 pg   MCHC 33.9 30.0 - 36.0 g/dL   RDW 13.6 11.5 - 15.5 %   Platelets 318 150 - 400 K/uL   MPV 10.5 8.6 - 12.4 fL  Comprehensive metabolic panel     Status: None   Collection Time: 03/19/15  3:18 PM  Result Value Ref Range   Sodium 138 135 - 146 mmol/L   Potassium 4.3 3.5 - 5.3 mmol/L   Chloride 102 98 - 110 mmol/L   CO2 27 20 - 31 mmol/L   Glucose, Bld 97 65 - 99 mg/dL   BUN 10 7 - 25 mg/dL   Creat 0.79 0.50 - 1.10 mg/dL   Total Bilirubin 0.7 0.2 - 1.2 mg/dL   Alkaline Phosphatase 62  33 - 115 U/L   AST 18 10 - 35 U/L   ALT 13 6 - 29 U/L   Total Protein 7.1 6.1 - 8.1 g/dL   Albumin 4.2 3.6 - 5.1 g/dL   Calcium 9.6 8.6 - 10.2 mg/dL  Lipid panel     Status: Abnormal   Collection Time: 03/19/15  3:18 PM  Result Value Ref Range   Cholesterol 228 (H) 125 - 200 mg/dL   Triglycerides 50 <150 mg/dL   HDL 83 >=46 mg/dL   Total CHOL/HDL Ratio 2.7 <=5.0 Ratio   VLDL 10 <30 mg/dL   LDL Cholesterol 135 (H) <130 mg/dL    Comment:   Total Cholesterol/HDL Ratio:CHD Risk                        Coronary Heart Disease Risk Table                                        Men       Women  1/2 Average Risk              3.4        3.3              Average Risk              5.0        4.4           2X Average Risk              9.6        7.1           3X Average Risk             23.4       11.0 Use the calculated Patient Ratio above and the CHD Risk table  to determine the patient's CHD Risk.   TSH     Status: Abnormal   Collection Time: 03/19/15  3:18 PM  Result Value Ref Range   TSH 0.288 (L) 0.350 - 4.500 uIU/mL  Vitamin D 1,25 dihydroxy     Status: None   Collection Time: 03/19/15  3:18 PM  Result Value Ref Range   Vitamin D 1, 25 (OH)2 Total 60 18 - 72 pg/mL   Vitamin D3 1, 25 (OH)2 60 pg/mL   Vitamin D2 1, 25 (OH)2 <8 pg/mL    Comment: Vitamin D3, 1,25(OH)2 indicates both endogenous production and supplementation.  Vitamin D2, 1,25(OH)2 is an indicator of exogeous sources, such as diet or supplementation.  Interpretation and therapy are based on measurement of Vitamin D,1,25(OH)2, Total. This test was developed and its analytical performance characteristics have been determined by [facility], [place], [place]. It has not been cleared or approved by the FDA. This assay has been validated pursuant to the CLIA regulations and is used for clinical purposes.   POCT urinalysis dipstick     Status: Abnormal   Collection Time: 03/19/15  3:25 PM   Result Value Ref Range   Color, UA yellow yellow   Clarity, UA clear clear   Glucose, UA negative negative   Bilirubin, UA negative negative   Ketones, POC UA trace (5) (A) negative   Spec Grav, UA 1.025    Blood, UA negative negative   pH, UA 7.0    Protein Ur, POC negative negative   Urobilinogen, UA 0.2    Nitrite, UA Negative Negative   Leukocytes, UA Negative Negative  Urinalysis, Routine w reflex microscopic (not at [facility])     Status: None   Collection Time: 05/25/15  1:04 PM  Result Value Ref Range   Color, Urine YELLOW YELLOW   APPearance CLEAR CLEAR   Specific Gravity, Urine 1.015 1.005 - 1.030   pH 8.0 5.0 - 8.0   Glucose, UA NEGATIVE NEGATIVE mg/dL   Hgb urine dipstick NEGATIVE NEGATIVE   Bilirubin Urine NEGATIVE NEGATIVE   Ketones, ur NEGATIVE NEGATIVE mg/dL   Protein, ur NEGATIVE NEGATIVE mg/dL   Nitrite NEGATIVE NEGATIVE   Leukocytes, UA NEGATIVE NEGATIVE    Comment: MICROSCOPIC NOT DONE ON URINES WITH NEGATIVE PROTEIN, BLOOD, LEUKOCYTES, NITRITE, OR GLUCOSE <1000 mg/dL.  CBC     Status: None   Collection Time: 05/25/15  1:46 PM  Result Value Ref Range   WBC 8.5 4.0 - 10.5 K/uL   RBC 4.45 3.87 - 5.11 MIL/uL   Hemoglobin 13.5 12.0 - 15.0 g/dL   HCT 93.4 62.5 - 31.0 %   MCV 87.9 78.0 - 100.0  fL   MCH 30.3 26.0 - 34.0 pg   MCHC 34.5 30.0 - 36.0 g/dL   RDW 13.0 11.5 - 15.5 %   Platelets 275 150 - 400 K/uL  Basic metabolic panel     Status: None   Collection Time: 05/25/15  1:46 PM  Result Value Ref Range   Sodium 140 135 - 145 mmol/L   Potassium 4.3 3.5 - 5.1 mmol/L   Chloride 105 101 - 111 mmol/L   CO2 26 22 - 32 mmol/L   Glucose, Bld 98 65 - 99 mg/dL   BUN 7 6 - 20 mg/dL   Creatinine, Ser 0.89 0.44 - 1.00 mg/dL   Calcium 10.2 8.9 - 10.3 mg/dL   GFR calc non Af Amer >60 >60 mL/min   GFR calc Af Amer >60 >60 mL/min    Comment: (NOTE) The eGFR has been calculated using the CKD EPI equation. This calculation has not been validated in all clinical  situations. eGFR's persistently <60 mL/min signify possible Chronic Kidney Disease.    Anion gap 9 5 - 15  Lipase, blood     Status: None   Collection Time: 05/25/15  1:46 PM  Result Value Ref Range   Lipase 23 11 - 51 U/L  Hepatic function panel     Status: Abnormal (Preliminary result)   Collection Time: 05/25/15  1:46 PM  Result Value Ref Range   Total Protein 8.0 6.5 - 8.1 g/dL   Albumin 4.4 3.5 - 5.0 g/dL   AST 22 15 - 41 U/L   ALT 16 14 - 54 U/L   Alkaline Phosphatase 64 38 - 126 U/L   Total Bilirubin PENDING 0.3 - 1.2 mg/dL   Bilirubin, Direct <0.1 (L) 0.1 - 0.5 mg/dL   Indirect Bilirubin PENDING 0.3 - 0.9 mg/dL  I-Stat CG4 Lactic Acid, ED     Status: None   Collection Time: 05/25/15  4:18 PM  Result Value Ref Range   Lactic Acid, Venous 1.24 0.5 - 2.0 mmol/L   CT Abdomen IMPRESSION: Prior cholecystectomy. Mild biliary prominence which may be related to post cholecystectomy state. Recommend correlation with LFTs.  Right lower pole nephrolithiasis.  Normal appendix.  Lahoma Crocker Florinda Taflinger, PA-C 05/25/15 1802  Noemi Chapel, MD 06/12/15 [phone]

## 2015-05-25 NOTE — ED Notes (Signed)
Pt assisted to BR.

## 2015-05-28 ENCOUNTER — Encounter: Payer: Self-pay | Admitting: Endocrinology

## 2015-05-28 ENCOUNTER — Ambulatory Visit (INDEPENDENT_AMBULATORY_CARE_PROVIDER_SITE_OTHER): Payer: Commercial Managed Care - HMO | Admitting: Endocrinology

## 2015-05-28 VITALS — BP 128/80 | HR 73 | Temp 98.1°F | Ht 66.0 in | Wt 202.0 lb

## 2015-05-28 DIAGNOSIS — R7989 Other specified abnormal findings of blood chemistry: Secondary | ICD-10-CM

## 2015-05-28 DIAGNOSIS — R946 Abnormal results of thyroid function studies: Secondary | ICD-10-CM | POA: Diagnosis not present

## 2015-05-28 LAB — T4, FREE: Free T4: 0.57 ng/dL — ABNORMAL LOW (ref 0.60–1.60)

## 2015-05-28 LAB — TSH: TSH: 0.78 u[IU]/mL (ref 0.35–4.50)

## 2015-05-28 MED ORDER — METHIMAZOLE 5 MG PO TABS: 5.0000 mg | ORAL_TABLET | ORAL | Status: DC

## 2015-05-28 NOTE — Progress Notes (Signed)
Subjective:    Patient ID: Kendra Gonzalez, female    DOB: 1971-04-03, 45 y.o.   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 Cat. 2); she was rx'ed tapazole).  Since on tapazole, fatigue is improved.  Past Medical History  Diagnosis Date  . Endometriosis   . Hyperlipidemia   . Kidney stones   . Asthma   . Allergy   . Pancreatitis   . History of tetanus, diphtheria, and acellular pertussis booster vaccination (Tdap)     07/30/2010  . Arthritis   . GERD (gastroesophageal reflux disease)     Past Surgical History  Procedure Laterality Date  . Cholecystectomy    . Abdominal hysterectomy    . Oophrectomy  2011    secondary to cysts  . Carpel tunnel    . Arthroscopic knee surgery     . Hip surgery    . Ercp  01/08/2012    Procedure: ENDOSCOPIC RETROGRADE CHOLANGIOPANCREATOGRAPHY (ERCP);  Surgeon: Beryle Beams, MD;  Location: [facility] ENDOSCOPY;  Service: Endoscopy;  Laterality: N/A;  . Tubal ligation      Social History   Social History  . Marital Status: Married    Spouse Name: N/A  . Number of Children: N/A  . Years of Education: N/A   Occupational History  . Not on file.   Social History Main Topics  . Smoking status: Never Smoker   . Smokeless tobacco: Never Used  . Alcohol Use: No  . Drug Use: No  . Sexual Activity: Yes    Birth Control/ Protection: None   Other Topics Concern  . Not on file   Social History Narrative   Works as a Education officer, environmental.  Married with 52 child, 27 year old son.    Current Outpatient Prescriptions on File Prior to Visit  Medication Sig Dispense Refill  . acetaminophen (TYLENOL) 500 MG tablet Take 1,000 mg by mouth every 6 (six) hours as needed for mild pain or headache.    . albuterol (PROVENTIL HFA;VENTOLIN HFA) 108 (90 BASE) MCG/ACT inhaler Inhale 2 puffs into the lungs every 6 (six) hours as needed for wheezing or shortness of breath. 1  Inhaler 6  . Boric Acid POWD Place 600 mg capsule intravaginally once a week: Boric acid suppository (Patient taking differently: Place 600 mg vaginally as directed. Place 600 mg capsule intravaginally once a week as needed for irritation: Boric acid suppository) 180 g 6  . cetirizine-pseudoephedrine (ZYRTEC-D ALLERGY & CONGESTION) 5-120 MG tablet Take 1 tablet by mouth 2 (two) times daily. 14 tablet 0  . dicyclomine (BENTYL) 20 MG tablet Take 1 tablet (20 mg total) by mouth 2 (two) times daily. 20 tablet 0  . fluticasone-salmeterol (ADVAIR HFA) 115-21 MCG/ACT inhaler Inhale 2 puffs into the lungs 2 (two) times daily. (Patient taking differently: Inhale 2 puffs into the lungs 2 (two) times daily as needed (COPD). ) 1 Inhaler 11  . glucosamine-chondroitin 500-400 MG tablet Take 1 tablet by mouth 3 (three) times daily as needed (joint pain).     Marland Kitchen levocetirizine (XYZAL) 5 MG tablet Take 1 tablet (5 mg total) by mouth every evening. (Patient taking differently: Take 5 mg by mouth daily as needed for allergies. ) 30 tablet 6  . magnesium gluconate (MAGONATE) 500 MG tablet Take 500 mg by mouth at bedtime.    . Multiple Vitamins-Minerals (MULTIVITAMIN WITH MINERALS) tablet Take 1 tablet  by mouth daily.    . Omega-3 Fatty Acids (FISH OIL PO) Take 1 capsule by mouth daily.    . ondansetron (ZOFRAN) 4 MG tablet Take 1 tablet (4 mg total) by mouth every 6 (six) hours. 12 tablet 0  . ondansetron (ZOFRAN-ODT) 8 MG disintegrating tablet Take 1 tablet (8 mg total) by mouth every 8 (eight) hours as needed for nausea. 10 tablet 0  . pantoprazole (PROTONIX) 40 MG tablet Take 1 tablet (40 mg total) by mouth daily. (Patient taking differently: Take 40 mg by mouth daily as needed (heartburn). ) 30 tablet 5  . traMADol (ULTRAM) 50 MG tablet Take 50 mg by mouth every 6 (six) hours as needed. pain    . vitamin C (ASCORBIC ACID) 500 MG tablet Take 500 mg by mouth daily.     No current facility-administered medications on  file prior to visit.    No Known Allergies  Family History  Problem Relation Age of Onset  . Heart disease Daughter   . Hypertension Father   . Hypertension Sister   . Hypertension Brother   . Hypertension Maternal Grandfather   . Heart disease Maternal Grandfather   . Hypertension Paternal Grandfather   . Hypertension Brother   . Hypertension Sister   . Thyroid disease Neg Hx     BP 128/80 mmHg  Pulse 73  Temp(Src) 98.1 F (36.7 C) (Oral)  Ht 5\' 6"  (1.676 m)  Wt 202 lb (91.627 kg)  BMI 32.62 kg/m2  SpO2 97%  LMP 06/06/1996  Review of Systems She denies fever    Objective:   Physical Exam VITAL SIGNS:  See vs page GENERAL: no distress NECK: thyroid is not enlarged.  I can palpate only the LLP nodule (approx 1 cm).    Lab Results  Component Value Date   TSH 0.78 05/28/2015       Assessment & Plan:  Hyperthyrodism: well-controlled   Patient is advised the following: Patient Instructions  blood tests are requested for you today.  We'll let you know about the results. if ever you have fever while taking methimazole, stop it and call us, even if the reason is obvious, because of the risk of a rare side-effect.  Please come back for a follow-up appointment in 3 months.  We can reconsider the radioactive iodine in the future if you wish.    Addendum:reduce tapazole to twice a week.

## 2015-05-28 NOTE — Patient Instructions (Addendum)
blood tests are requested for you today.  We'll let you know about the results. if ever you have fever while taking methimazole, stop it and call us, even if the reason is obvious, because of the risk of a rare side-effect.  Please come back for a follow-up appointment in 3 months.  We can reconsider the radioactive iodine in the future if you wish.

## 2015-06-11 ENCOUNTER — Ambulatory Visit (INDEPENDENT_AMBULATORY_CARE_PROVIDER_SITE_OTHER): Payer: Commercial Managed Care - HMO | Admitting: Physician Assistant

## 2015-06-11 VITALS — BP 136/83 | HR 98 | Temp 99.0°F | Resp 16 | Ht 65.25 in | Wt 200.2 lb

## 2015-06-11 DIAGNOSIS — R51 Headache: Secondary | ICD-10-CM

## 2015-06-11 DIAGNOSIS — R110 Nausea: Secondary | ICD-10-CM

## 2015-06-11 DIAGNOSIS — R6889 Other general symptoms and signs: Secondary | ICD-10-CM

## 2015-06-11 DIAGNOSIS — R509 Fever, unspecified: Secondary | ICD-10-CM

## 2015-06-11 DIAGNOSIS — R05 Cough: Secondary | ICD-10-CM | POA: Diagnosis not present

## 2015-06-11 LAB — POCT INFLUENZA A/B
INFLUENZA B, POC: NEGATIVE
Influenza A, POC: NEGATIVE

## 2015-06-11 MED ORDER — HYDROCOD POLST-CPM POLST ER 10-8 MG/5ML PO SUER: 5.0000 mL | Freq: Two times a day (BID) | ORAL | Status: DC | PRN

## 2015-06-11 MED ORDER — GUAIFENESIN ER 1200 MG PO TB12: 1.0000 | ORAL_TABLET | Freq: Two times a day (BID) | ORAL | Status: AC

## 2015-06-11 NOTE — Progress Notes (Signed)
Kendra Gonzalez  MRN: [id number] DOB: 21-Oct-1970  Subjective:  Pt presents to clinic with 24h h/o myalgias and headache and congestion and dry cough. She start 48h ago with what she thought was her allergies acting up but then yesterday her symptoms got worse.  Sick contacts - none Flu vaccine - no Home treatment - allergy medications  Patient Active Problem List   Diagnosis Date Noted  . Multinodular goiter 04/17/2015  . History of stomach ulcers 03/22/2015  . Low TSH level 03/22/2015  . Abdominal pain, chronic, right upper quadrant 06/23/2012  . Obesity (BMI 30.0-34.9) 10/12/2011  . HYPERLIPIDEMIA 02/16/2007  . ALLERGIC RHINITIS 02/16/2007  . GERD 02/16/2007    Current Outpatient Prescriptions on File Prior to Visit  Medication Sig Dispense Refill  . acetaminophen (TYLENOL) 500 MG tablet Take 1,000 mg by mouth every 6 (six) hours as needed for mild pain or headache.    . albuterol (PROVENTIL HFA;VENTOLIN HFA) 108 (90 BASE) MCG/ACT inhaler Inhale 2 puffs into the lungs every 6 (six) hours as needed for wheezing or shortness of breath. 1 Inhaler 6  . Boric Acid POWD Place 600 mg capsule intravaginally once a week: Boric acid suppository (Patient taking differently: Place 600 mg vaginally as directed. Place 600 mg capsule intravaginally once a week as needed for irritation: Boric acid suppository) 180 g 6  . cetirizine-pseudoephedrine (ZYRTEC-D ALLERGY & CONGESTION) 5-120 MG tablet Take 1 tablet by mouth 2 (two) times daily. 14 tablet 0  . fluticasone-salmeterol (ADVAIR HFA) 115-21 MCG/ACT inhaler Inhale 2 puffs into the lungs 2 (two) times daily. (Patient taking differently: Inhale 2 puffs into the lungs 2 (two) times daily as needed (COPD). ) 1 Inhaler 11  . glucosamine-chondroitin 500-400 MG tablet Take 1 tablet by mouth 3 (three) times daily as needed (joint pain).     Marland Kitchen levocetirizine (XYZAL) 5 MG tablet Take 1 tablet (5 mg total) by mouth every evening. (Patient taking  differently: Take 5 mg by mouth daily as needed for allergies. ) 30 tablet 6  . magnesium gluconate (MAGONATE) 500 MG tablet Take 500 mg by mouth at bedtime.    . methimazole (TAPAZOLE) 5 MG tablet Take 1 tablet (5 mg total) by mouth 2 (two) times a week. 10 tablet 3  . Multiple Vitamins-Minerals (MULTIVITAMIN WITH MINERALS) tablet Take 1 tablet by mouth daily.    . Omega-3 Fatty Acids (FISH OIL PO) Take 1 capsule by mouth daily.    . pantoprazole (PROTONIX) 40 MG tablet Take 1 tablet (40 mg total) by mouth daily. (Patient taking differently: Take 40 mg by mouth daily as needed (heartburn). ) 30 tablet 5  . traMADol (ULTRAM) 50 MG tablet Take 50 mg by mouth every 6 (six) hours as needed. pain    . vitamin C (ASCORBIC ACID) 500 MG tablet Take 500 mg by mouth daily.    . ondansetron (ZOFRAN-ODT) 8 MG disintegrating tablet Take 1 tablet (8 mg total) by mouth every 8 (eight) hours as needed for nausea. (Patient not taking: Reported on 06/11/2015) 10 tablet 0   No current facility-administered medications on file prior to visit.    No Known Allergies  Review of Systems  Constitutional: Positive for fever (102) and chills.  HENT: Positive for congestion and postnasal drip. Negative for rhinorrhea and sore throat.   Respiratory: Positive for cough.   Gastrointestinal: Positive for nausea. Negative for vomiting and diarrhea.  Musculoskeletal: Positive for myalgias.  Neurological: Positive for headaches.   Objective:  BP  136/83 mmHg  Pulse 98  Temp(Src) 99 F (37.2 C) (Oral)  Resp 16  Ht 5' 5.25" (1.657 m)  Wt 200 lb 3.2 oz (90.81 kg)  BMI 33.07 kg/m2  SpO2 96%  LMP 06/06/1996  Physical Exam  Constitutional: She is oriented to person, place, and time and well-developed, well-nourished, and in no distress.  HENT:  Head: Normocephalic and atraumatic.  Right Ear: Hearing, tympanic membrane, external ear and ear canal normal.  Left Ear: Hearing, tympanic membrane, external ear and ear canal  normal.  Nose: Nose normal.  Mouth/Throat: Uvula is midline, oropharynx is clear and moist and mucous membranes are normal.  Eyes: Conjunctivae are normal.  Neck: Normal range of motion.  Cardiovascular: Normal rate, regular rhythm and normal heart sounds.   No murmur heard. Pulmonary/Chest: Effort normal and breath sounds normal.  Neurological: She is alert and oriented to person, place, and time. Gait normal.  Skin: Skin is warm and dry.  Psychiatric: Mood, memory, affect and judgment normal.  Vitals reviewed.   Results for orders placed or performed in visit on 06/11/15  POCT Influenza A/B  Result Value Ref Range   Influenza A, POC Negative Negative   Influenza B, POC Negative Negative    Assessment and Plan :  Flu-like symptoms - Plan: POCT Influenza A/B, Guaifenesin (MUCINEX MAXIMUM STRENGTH) 1200 MG TB12, chlorpheniramine-HYDROcodone (TUSSIONEX PENNKINETIC ER) 10-8 MG/5ML SUER, Care order/instruction  Symptomatic care  Windell Hummingbird PA-C  [facility] 06/11/2015 10:49 AM

## 2015-06-11 NOTE — Patient Instructions (Signed)
Please push fluids.  Tylenol and Motrin for fever and body aches.    A humidifier can help especially when the air is dry -if you do not have a humidifier you can boil a pot of water on the stove in your home to help with the dry air.

## 2015-08-26 ENCOUNTER — Ambulatory Visit (INDEPENDENT_AMBULATORY_CARE_PROVIDER_SITE_OTHER): Payer: Commercial Managed Care - HMO | Admitting: Endocrinology

## 2015-08-26 ENCOUNTER — Encounter: Payer: Self-pay | Admitting: Endocrinology

## 2015-08-26 VITALS — BP 132/64 | HR 74 | Temp 97.9°F | Resp 14 | Ht 66.0 in | Wt 195.6 lb

## 2015-08-26 DIAGNOSIS — E042 Nontoxic multinodular goiter: Secondary | ICD-10-CM | POA: Diagnosis not present

## 2015-08-26 NOTE — Patient Instructions (Addendum)
blood tests are requested for you today.  We'll let you know about the results. if ever you have fever while taking methimazole, stop it and call us, even if the reason is obvious, because of the risk of a rare side-effect.  Please come back for a follow-up appointment in 4 months.  We can reconsider the radioactive iodine in the future if you wish.  You will be due for another ultrasound next year.

## 2015-08-26 NOTE — Progress Notes (Signed)
Subjective:    Patient ID: Kendra Gonzalez, female    DOB: 1971/01/01, 45 y.o.   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 Cat. 2); she was rx'ed tapazole; she declines RAI).  Since on tapazole, fatigue is improved.   Past Medical History  Diagnosis Date  . Endometriosis   . Hyperlipidemia   . Kidney stones   . Asthma   . Allergy   . Pancreatitis   . History of tetanus, diphtheria, and acellular pertussis booster vaccination (Tdap)     07/30/2010  . Arthritis   . GERD (gastroesophageal reflux disease)     Past Surgical History  Procedure Laterality Date  . Cholecystectomy    . Abdominal hysterectomy    . Oophrectomy  2011    secondary to cysts  . Carpel tunnel    . Arthroscopic knee surgery     . Hip surgery    . Ercp  01/08/2012    Procedure: ENDOSCOPIC RETROGRADE CHOLANGIOPANCREATOGRAPHY (ERCP);  Surgeon: Beryle Beams, MD;  Location: [facility] ENDOSCOPY;  Service: Endoscopy;  Laterality: N/A;  . Tubal ligation      Social History   Social History  . Marital Status: Married    Spouse Name: N/A  . Number of Children: N/A  . Years of Education: N/A   Occupational History  . Not on file.   Social History Main Topics  . Smoking status: Never Smoker   . Smokeless tobacco: Never Used  . Alcohol Use: No  . Drug Use: No  . Sexual Activity: Yes    Birth Control/ Protection: None   Other Topics Concern  . Not on file   Social History Narrative   Works as a Education officer, environmental.  Married with 37 child, 42 year old son.    Current Outpatient Prescriptions on File Prior to Visit  Medication Sig Dispense Refill  . acetaminophen (TYLENOL) 500 MG tablet Take 1,000 mg by mouth every 6 (six) hours as needed for mild pain or headache.    . albuterol (PROVENTIL HFA;VENTOLIN HFA) 108 (90 BASE) MCG/ACT inhaler Inhale 2 puffs into the lungs every 6 (six) hours as needed for wheezing or  shortness of breath. 1 Inhaler 6  . Boric Acid POWD Place 600 mg capsule intravaginally once a week: Boric acid suppository (Patient taking differently: Place 600 mg vaginally as directed. Place 600 mg capsule intravaginally once a week as needed for irritation: Boric acid suppository) 180 g 6  . cetirizine-pseudoephedrine (ZYRTEC-D ALLERGY & CONGESTION) 5-120 MG tablet Take 1 tablet by mouth 2 (two) times daily. 14 tablet 0  . fluticasone-salmeterol (ADVAIR HFA) 115-21 MCG/ACT inhaler Inhale 2 puffs into the lungs 2 (two) times daily. (Patient taking differently: Inhale 2 puffs into the lungs 2 (two) times daily as needed (COPD). ) 1 Inhaler 11  . glucosamine-chondroitin 500-400 MG tablet Take 1 tablet by mouth 3 (three) times daily as needed (joint pain).     Marland Kitchen levocetirizine (XYZAL) 5 MG tablet Take 1 tablet (5 mg total) by mouth every evening. (Patient taking differently: Take 5 mg by mouth daily as needed for allergies. ) 30 tablet 6  . magnesium gluconate (MAGONATE) 500 MG tablet Take 500 mg by mouth at bedtime.    . methimazole (TAPAZOLE) 5 MG tablet Take 1 tablet (5 mg total) by mouth 2 (two) times a week. 10 tablet 3  . Multiple Vitamins-Minerals (MULTIVITAMIN WITH  MINERALS) tablet Take 1 tablet by mouth daily.    . Omega-3 Fatty Acids (FISH OIL PO) Take 1 capsule by mouth daily.    . pantoprazole (PROTONIX) 40 MG tablet Take 1 tablet (40 mg total) by mouth daily. (Patient taking differently: Take 40 mg by mouth daily as needed (heartburn). ) 30 tablet 5  . traMADol (ULTRAM) 50 MG tablet Take 50 mg by mouth every 6 (six) hours as needed. pain    . vitamin C (ASCORBIC ACID) 500 MG tablet Take 500 mg by mouth daily.     No current facility-administered medications on file prior to visit.    No Known Allergies  Family History  Problem Relation Age of Onset  . Heart disease Daughter   . Hypertension Father   . Hypertension Sister   . Hypertension Brother   . Hypertension Maternal  Grandfather   . Heart disease Maternal Grandfather   . Hypertension Paternal Grandfather   . Hypertension Brother   . Hypertension Sister   . Thyroid disease Neg Hx     BP 132/64 mmHg  Pulse 74  Temp(Src) 97.9 F (36.6 C) (Oral)  Resp 14  Ht 5\' 6"  (1.676 m)  Wt 195 lb 9.6 oz (88.724 kg)  BMI 31.59 kg/m2  SpO2 98%  LMP 06/06/1996  Review of Systems Denies fever.    Objective:   Physical Exam VITAL SIGNS:  See vs page GENERAL: no distress NECK: thyroid is not enlarged.  I can palpate only the LLP nodule (approx 1 cm).        Assessment & Plan:  Hyperthyroidism: due for recheck.   Patient is advised the following: Patient Instructions  blood tests are requested for you today.  We'll let you know about the results. if ever you have fever while taking methimazole, stop it and call us, even if the reason is obvious, because of the risk of a rare side-effect.  Please come back for a follow-up appointment in 4 months.  We can reconsider the radioactive iodine in the future if you wish.  You will be due for another ultrasound next year.

## 2015-08-29 ENCOUNTER — Other Ambulatory Visit: Payer: 59

## 2015-10-19 ENCOUNTER — Ambulatory Visit (INDEPENDENT_AMBULATORY_CARE_PROVIDER_SITE_OTHER): Payer: Commercial Managed Care - HMO | Admitting: Family Medicine

## 2015-10-19 VITALS — BP 120/80 | HR 87 | Temp 99.3°F | Resp 14 | Ht 64.0 in | Wt 186.0 lb

## 2015-10-19 DIAGNOSIS — J069 Acute upper respiratory infection, unspecified: Secondary | ICD-10-CM

## 2015-10-19 MED ORDER — FLUTICASONE PROPIONATE 50 MCG/ACT NA SUSP: 2.0000 | Freq: Every day | NASAL | Status: DC

## 2015-10-19 MED ORDER — HYDROCOD POLST-CPM POLST ER 10-8 MG/5ML PO SUER: 5.0000 mL | Freq: Every evening | ORAL | Status: DC | PRN

## 2015-10-19 MED ORDER — PSEUDOEPHEDRINE HCL ER 120 MG PO TB12: 120.0000 mg | ORAL_TABLET | Freq: Every day | ORAL | Status: DC

## 2015-10-19 MED ORDER — IPRATROPIUM BROMIDE 0.03 % NA SOLN: 2.0000 | Freq: Four times a day (QID) | NASAL | Status: DC

## 2015-10-19 NOTE — Progress Notes (Signed)
Subjective:    Patient ID: Kendra Gonzalez, female    DOB: 06-23-1970, 45 y.o.   MRN: [id number] By signing my name below, I, Judithe Modest, attest that this documentation has been prepared under the direction and in the presence of Delman Cheadle, MD. Electronically Signed: Judithe Modest, ER Scribe. 10/19/2015. 2:02 PM.  Chief Complaint  Patient presents with  . Sinus Problem    4 days  . Nasal Congestion  . Cough    HPI HPI Comments: Kendra Gonzalez is a 45 y.o. female who presents to [facility] complaining of nasal conception, rhinorrhea, cough, and sneezing for the last three days. Her sx started with a sore throat four days ago. She is being treated for hypothyroidism. She is taking Zyzol daily and Advair twice daily. She is not taking sudafed or any other decongestant. She is eating and drinking normally. Her BM and urination has been normal. She is taking Theraflu. She is not using a nasal spray.    Past Medical History  Diagnosis Date  . Endometriosis   . Hyperlipidemia   . Kidney stones   . Asthma   . Allergy   . Pancreatitis   . History of tetanus, diphtheria, and acellular pertussis booster vaccination (Tdap)     07/30/2010  . Arthritis   . GERD (gastroesophageal reflux disease)    No Known Allergies  Current Outpatient Prescriptions on File Prior to Visit  Medication Sig Dispense Refill  . acetaminophen (TYLENOL) 500 MG tablet Take 1,000 mg by mouth every 6 (six) hours as needed for mild pain or headache.    . albuterol (PROVENTIL HFA;VENTOLIN HFA) 108 (90 BASE) MCG/ACT inhaler Inhale 2 puffs into the lungs every 6 (six) hours as needed for wheezing or shortness of breath. 1 Inhaler 6  . Boric Acid POWD Place 600 mg capsule intravaginally once a week: Boric acid suppository (Patient taking differently: Place 600 mg vaginally as directed. Place 600 mg capsule intravaginally once a week as needed for irritation: Boric acid suppository) 180 g 6  .  cetirizine-pseudoephedrine (ZYRTEC-D ALLERGY & CONGESTION) 5-120 MG tablet Take 1 tablet by mouth 2 (two) times daily. 14 tablet 0  . fluticasone-salmeterol (ADVAIR HFA) 115-21 MCG/ACT inhaler Inhale 2 puffs into the lungs 2 (two) times daily. (Patient taking differently: Inhale 2 puffs into the lungs 2 (two) times daily as needed (COPD). ) 1 Inhaler 11  . glucosamine-chondroitin 500-400 MG tablet Take 1 tablet by mouth 3 (three) times daily as needed (joint pain).     Marland Kitchen levocetirizine (XYZAL) 5 MG tablet Take 1 tablet (5 mg total) by mouth every evening. (Patient taking differently: Take 5 mg by mouth daily as needed for allergies. ) 30 tablet 6  . magnesium gluconate (MAGONATE) 500 MG tablet Take 500 mg by mouth at bedtime.    . methimazole (TAPAZOLE) 5 MG tablet Take 1 tablet (5 mg total) by mouth 2 (two) times a week. 10 tablet 3  . Multiple Vitamins-Minerals (MULTIVITAMIN WITH MINERALS) tablet Take 1 tablet by mouth daily.    . pantoprazole (PROTONIX) 40 MG tablet Take 1 tablet (40 mg total) by mouth daily. (Patient taking differently: Take 40 mg by mouth daily as needed (heartburn). ) 30 tablet 5   No current facility-administered medications on file prior to visit.    Review of Systems  Constitutional: Negative for fever and chills.  HENT: Positive for congestion, rhinorrhea and sneezing. Negative for sinus pressure.   Respiratory: Positive for cough. Negative  for shortness of breath and wheezing.        Objective:  BP 120/80 mmHg  Pulse 87  Temp(Src) 98.5 F (36.9 C) (Other (Comment))  Resp 14  Ht 5\' 4"  (1.626 m)  Wt 186 lb (84.369 kg)  BMI 31.91 kg/m2  SpO2 100%  LMP 06/06/1996  Physical Exam  Constitutional: She is oriented to person, place, and time. She appears well-developed and well-nourished. No distress.  HENT:  Head: Normocephalic and atraumatic.  TMs and nares are normal. Oropharynx normal. Frontal and maxillary sinuses are clear.   Eyes: Pupils are equal, round,  and reactive to light.  Neck: Neck supple. No thyromegaly present.  Submandibular and anterior cervical adenopathy. No supraclavicular or posterior cervical adenopathy.   Cardiovascular: Normal rate, regular rhythm and normal heart sounds.   Pulmonary/Chest: Effort normal and breath sounds normal. No respiratory distress. She has no wheezes.  Musculoskeletal: Normal range of motion.  Neurological: She is alert and oriented to person, place, and time. Coordination normal.  Skin: Skin is warm and dry. She is not diaphoretic.  Psychiatric: She has a normal mood and affect. Her behavior is normal.  Nursing note and vitals reviewed.  2:11 PM Temperature recheck: 99.3.     Assessment & Plan:   1. Acute URI     Meds ordered this encounter  Medications  . VOLTAREN 1 % GEL    Sig:   . ipratropium (ATROVENT) 0.03 % nasal spray    Sig: Place 2 sprays into the nose 4 (four) times daily.    Dispense:  30 mL    Refill:  0  . chlorpheniramine-HYDROcodone (TUSSIONEX PENNKINETIC ER) 10-8 MG/5ML SUER    Sig: Take 5 mLs by mouth at bedtime as needed.    Dispense:  120 mL    Refill:  0  . pseudoephedrine (SUDAFED 12 HOUR) 120 MG 12 hr tablet    Sig: Take 1 tablet (120 mg total) by mouth daily.    Dispense:  14 tablet    Refill:  0  . fluticasone (FLONASE) 50 MCG/ACT nasal spray    Sig: Place 2 sprays into both nostrils at bedtime.    Dispense:  16 g    Refill:  2    I personally performed the services described in this documentation, which was scribed in my presence. The recorded information has been reviewed and considered, and addended by me as needed.   Delman Cheadle, M.D.  [facility] [address] [place], [place] [address] [phone] phone [phone] fax  10/25/2015 12:23 PM

## 2015-10-19 NOTE — Patient Instructions (Addendum)
   IF you received an x-ray today, you will receive an invoice from [facility]. Please contact [facility] at [phone] with questions or concerns regarding your invoice.   IF you received labwork today, you will receive an invoice from [facility]. Please contact [facility] at [phone] with questions or concerns regarding your invoice.   Our billing staff will not be able to assist you with questions regarding bills from these companies.  You will be contacted with the lab results as soon as they are available. The fastest way to get your results is to activate your My Chart account. Instructions are located on the last page of this paperwork. If you have not heard from us regarding the results in 2 weeks, please contact this office.     Sinusitis, Adult Sinusitis is redness, soreness, and inflammation of the paranasal sinuses. Paranasal sinuses are air pockets within the bones of your face. They are located beneath your eyes, in the middle of your forehead, and above your eyes. In healthy paranasal sinuses, mucus is able to drain out, and air is able to circulate through them by way of your nose. However, when your paranasal sinuses are inflamed, mucus and air can become trapped. This can allow bacteria and other germs to grow and cause infection. Sinusitis can develop quickly and last only a short time (acute) or continue over a long period (chronic). Sinusitis that lasts for more than 12 weeks is considered chronic. CAUSES Causes of sinusitis include:  Allergies.  Structural abnormalities, such as displacement of the cartilage that separates your nostrils (deviated septum), which can decrease the air flow through your nose and sinuses and affect sinus drainage.  Functional abnormalities, such as when the small hairs (cilia) that line your sinuses and help remove mucus do not work properly or are not present. SIGNS AND SYMPTOMS Symptoms  of acute and chronic sinusitis are the same. The primary symptoms are pain and pressure around the affected sinuses. Other symptoms include:  Upper toothache.  Earache.  Headache.  Bad breath.  Decreased sense of smell and taste.  A cough, which worsens when you are lying flat.  Fatigue.  Fever.  Thick drainage from your nose, which often is green and may contain pus (purulent).  Swelling and warmth over the affected sinuses. DIAGNOSIS Your health care provider will perform a physical exam. During your exam, your health care provider may perform any of the following to help determine if you have acute sinusitis or chronic sinusitis:  Look in your nose for signs of abnormal growths in your nostrils (nasal polyps).  Tap over the affected sinus to check for signs of infection.  View the inside of your sinuses using an imaging device that has a light attached (endoscope). If your health care provider suspects that you have chronic sinusitis, one or more of the following tests may be recommended:  Allergy tests.  Nasal culture. A sample of mucus is taken from your nose, sent to a lab, and screened for bacteria.  Nasal cytology. A sample of mucus is taken from your nose and examined by your health care provider to determine if your sinusitis is related to an allergy. TREATMENT Most cases of acute sinusitis are related to a viral infection and will resolve on their own within 10 days. Sometimes, medicines are prescribed to help relieve symptoms of both acute and chronic sinusitis. These may include pain medicines, decongestants, nasal steroid sprays, or saline sprays. However, for sinusitis related   to a bacterial infection, your health care provider will prescribe antibiotic medicines. These are medicines that will help kill the bacteria causing the infection. Rarely, sinusitis is caused by a fungal infection. In these cases, your health care provider will prescribe antifungal  medicine. For some cases of chronic sinusitis, surgery is needed. Generally, these are cases in which sinusitis recurs more than 3 times per year, despite other treatments. HOME CARE INSTRUCTIONS  Drink plenty of water. Water helps thin the mucus so your sinuses can drain more easily.  Use a humidifier.  Inhale steam 3-4 times a day (for example, sit in the bathroom with the shower running).  Apply a warm, moist washcloth to your face 3-4 times a day, or as directed by your health care provider.  Use saline nasal sprays to help moisten and clean your sinuses.  Take medicines only as directed by your health care provider.  If you were prescribed either an antibiotic or antifungal medicine, finish it all even if you start to feel better. SEEK IMMEDIATE MEDICAL CARE IF:  You have increasing pain or severe headaches.  You have nausea, vomiting, or drowsiness.  You have swelling around your face.  You have vision problems.  You have a stiff neck.  You have difficulty breathing.   This information is not intended to replace advice given to you by your health care provider. Make sure you discuss any questions you have with your health care provider.   Document Released: 04/13/2005 Document Revised: 05/04/2014 Document Reviewed: 04/28/2011 Elsevier Interactive Patient Education 2016 [employer]  

## 2015-11-15 ENCOUNTER — Telehealth: Payer: Self-pay

## 2015-11-15 NOTE — Telephone Encounter (Signed)
Patient is following up on her request to pharmacy for refill on Boric Acid POWD  [phone]

## 2015-11-18 NOTE — Telephone Encounter (Signed)
Pt advised.

## 2015-11-18 NOTE — Telephone Encounter (Signed)
Needs ov

## 2015-11-18 NOTE — Telephone Encounter (Signed)
He has to RTC?

## 2015-12-31 ENCOUNTER — Ambulatory Visit: Payer: 59 | Admitting: Endocrinology

## 2016-01-01 ENCOUNTER — Telehealth: Payer: Self-pay | Admitting: Endocrinology

## 2016-01-01 NOTE — Telephone Encounter (Signed)
Please come back for a follow-up appointment in 1 month.  

## 2016-01-01 NOTE — Telephone Encounter (Signed)
Patient no showed today's appt. Please advise on how to follow up. °A. No follow up necessary. °B. Follow up urgent. Contact patient immediately. °C. Follow up necessary. Contact patient and schedule visit in ___ days. °D. Follow up advised. Contact patient and schedule visit in ____weeks. ° °

## 2016-01-02 NOTE — Telephone Encounter (Signed)
Kendra Gonzalez,  Could you please contact the patient to reschedule.  Thanks!

## 2016-01-16 ENCOUNTER — Ambulatory Visit: Payer: Commercial Managed Care - HMO | Admitting: Endocrinology

## 2016-05-02 ENCOUNTER — Ambulatory Visit (INDEPENDENT_AMBULATORY_CARE_PROVIDER_SITE_OTHER): Payer: Commercial Managed Care - HMO | Admitting: Physician Assistant

## 2016-05-02 ENCOUNTER — Encounter (HOSPITAL_COMMUNITY): Payer: Self-pay

## 2016-05-02 ENCOUNTER — Telehealth: Payer: Self-pay | Admitting: Physician Assistant

## 2016-05-02 ENCOUNTER — Ambulatory Visit (HOSPITAL_COMMUNITY)
Admission: RE | Admit: 2016-05-02 | Discharge: 2016-05-02 | Disposition: A | Discharge status: Home / Self Care | Payer: Commercial Managed Care - HMO | Source: Ambulatory Visit | Attending: Physician Assistant | Admitting: Physician Assistant

## 2016-05-02 VITALS — BP 128/94 | HR 78 | Temp 97.4°F | Resp 16 | Ht 64.5 in | Wt 178.4 lb

## 2016-05-02 DIAGNOSIS — Z90710 Acquired absence of both cervix and uterus: Secondary | ICD-10-CM | POA: Insufficient documentation

## 2016-05-02 DIAGNOSIS — N200 Calculus of kidney: Secondary | ICD-10-CM | POA: Diagnosis not present

## 2016-05-02 DIAGNOSIS — R1031 Right lower quadrant pain: Secondary | ICD-10-CM

## 2016-05-02 DIAGNOSIS — Z9049 Acquired absence of other specified parts of digestive tract: Secondary | ICD-10-CM | POA: Diagnosis not present

## 2016-05-02 DIAGNOSIS — R109 Unspecified abdominal pain: Secondary | ICD-10-CM | POA: Diagnosis not present

## 2016-05-02 LAB — POCT URINALYSIS DIP (MANUAL ENTRY)
BILIRUBIN UA: NEGATIVE
BILIRUBIN UA: NEGATIVE
Glucose, UA: NEGATIVE
Leukocytes, UA: NEGATIVE
NITRITE UA: NEGATIVE
PH UA: 7.5
PROTEIN UA: NEGATIVE
RBC UA: NEGATIVE
Spec Grav, UA: 1.025
Urobilinogen, UA: 0.2

## 2016-05-02 LAB — POCT CBC
Granulocyte percent: 52.7 %G (ref 37–80)
HCT, POC: 39.5 % (ref 37.7–47.9)
HEMOGLOBIN: 13.8 g/dL (ref 12.2–16.2)
Lymph, poc: 2.9 (ref 0.6–3.4)
MCH, POC: 30.6 pg (ref 27–31.2)
MCHC: 35 g/dL (ref 31.8–35.4)
MCV: 87.5 fL (ref 80–97)
MID (cbc): 0.4 (ref 0–0.9)
MPV: 7.5 fL (ref 0–99.8)
POC GRANULOCYTE: 3.6 (ref 2–6.9)
POC LYMPH PERCENT: 42.1 %L (ref 10–50)
POC MID %: 5.2 % (ref 0–12)
Platelet Count, POC: 273 10*3/uL (ref 142–424)
RBC: 4.51 M/uL (ref 4.04–5.48)
RDW, POC: 13.3 %
WBC: 6.9 10*3/uL (ref 4.6–10.2)

## 2016-05-02 LAB — POC MICROSCOPIC URINALYSIS (UMFC)

## 2016-05-02 MED ORDER — IOPAMIDOL (ISOVUE-300) INJECTION 61%
INTRAVENOUS | Status: AC
  Filled 2016-05-02: qty 30

## 2016-05-02 MED ORDER — IOPAMIDOL (ISOVUE-300) INJECTION 61%
INTRAVENOUS | Status: AC
  Administered 2016-05-02: 100 mL
  Filled 2016-05-02: qty 100

## 2016-05-02 NOTE — Progress Notes (Signed)
Patient ID: Kendra Gonzalez, female    DOB: 12-Jan-1971, 46 y.o.   MRN: [id number]  PCP: Elizabeth Sauer  Chief Complaint  Patient presents with  . Back Pain    Rt sided low back pain - Hx of Kidney stone 10 years ago  . Urinary Frequency    x yesterday    Subjective:   Presents for evaluation of RIGHT sided low back and pelvic pain.  Cramping in the RIGHT low pelvis started yesterday about 7:30 pm. Began to radiate toward to the back. Slight increase in urinary frequency. No dysuria. No hematuria. Urine looked a Jaroszewski darker than usual yesterday. Has increased pain in the RIGHT lower quadrant with walking. S/p RIGHT oophorectomy. S/p hysterectomy. Known mass on LEFT ovary. Appendix remains. Feels different from kidney stone 10 years ago. Acetaminophen helped to ease, but did not resolve, the pain.  No fever. "I'm always cold." No nausea, vomiting or diarrhea.   Review of Systems As above    Patient Active Problem List   Diagnosis Date Noted  . Multinodular goiter 04/17/2015  . History of stomach ulcers 03/22/2015  . Low TSH level 03/22/2015  . Primary localized osteoarthrosis, pelvic region and thigh 05/02/2014  . Primary osteoarthritis of left hip 05/02/2014  . Chronic pain of left heel 04/23/2014  . Chronic pain syndrome 03/19/2014  . Asthma 01/04/2013  . Abdominal pain, chronic, right upper quadrant 06/23/2012  . Obesity (BMI 30.0-34.9) 10/12/2011  . Enthesopathy of hip region 01/22/2011  . 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 Radiculopathy 01/22/2011  . Trochanteric bursitis of left hip 01/22/2011  . HYPERLIPIDEMIA 02/16/2007  . ALLERGIC RHINITIS 02/16/2007  . GERD 02/16/2007     Prior to Admission medications   Medication Sig Start Date End Date Taking? Authorizing Provider  acetaminophen (TYLENOL) 500 MG tablet Take 1,000 mg by mouth every 6 (six) hours as needed for mild pain or headache.   Yes Historical Provider, MD  albuterol  (PROVENTIL HFA;VENTOLIN HFA) 108 (90 BASE) MCG/ACT inhaler Inhale 2 puffs into the lungs every 6 (six) hours as needed for wheezing or shortness of breath. 03/19/15  Yes Ezekiel Slocumb, PA-C  Boric Acid POWD Place 600 mg capsule intravaginally once a week: Boric acid suppository Patient taking differently: Place 600 mg vaginally as directed. Place 600 mg capsule intravaginally once a week as needed for irritation: Boric acid suppository 03/19/15  Yes Ezekiel Slocumb, PA-C  fluticasone-salmeterol (ADVAIR HFA) 115-21 MCG/ACT inhaler Inhale 2 puffs into the lungs 2 (two) times daily. Patient taking differently: Inhale 2 puffs into the lungs 2 (two) times daily as needed (COPD).  03/19/15  Yes Ezekiel Slocumb, PA-C  glucosamine-chondroitin 500-400 MG tablet Take 1 tablet by mouth 3 (three) times daily as needed (joint pain).    Yes Historical Provider, MD  ipratropium (ATROVENT) 0.03 % nasal spray Place 2 sprays into the nose 4 (four) times daily. 10/19/15  Yes Shawnee Knapp, MD  magnesium gluconate (MAGONATE) 500 MG tablet Take 500 mg by mouth at bedtime.   Yes Historical Provider, MD  Multiple Vitamins-Minerals (MULTIVITAMIN WITH MINERALS) tablet Take 1 tablet by mouth daily.   Yes Historical Provider, MD  pantoprazole (PROTONIX) 40 MG tablet Take 1 tablet (40 mg total) by mouth daily. Patient taking differently: Take 40 mg by mouth daily as needed (heartburn).  03/19/15  Yes Ezekiel Slocumb, PA-C  VOLTAREN 1 % GEL  09/13/15  Yes Historical Provider, MD  levocetirizine (XYZAL) 5 MG  tablet Take 1 tablet (5 mg total) by mouth every evening. Patient not taking: Reported on 05/02/2016 03/09/14   Thao P Le, DO  methimazole (TAPAZOLE) 5 MG tablet Take 1 tablet (5 mg total) by mouth 2 (two) times a week. Patient not taking: Reported on 05/02/2016 05/28/15   Renato Shin, MD     No Known Allergies     Objective:  Physical Exam  Constitutional: She is oriented to person, place, and time. She appears well-developed and  well-nourished. She is active and cooperative. No distress.  BP (!) 128/94   Pulse 78   Temp 97.4 F (36.3 C) (Oral)   Resp 16   Ht 5' 4.5" (1.638 m)   Wt 178 lb 6.4 oz (80.9 kg)   LMP 06/06/1996   SpO2 100%   BMI 30.15 kg/m   HENT:  Head: Normocephalic and atraumatic.  Right Ear: Hearing normal.  Left Ear: Hearing normal.  Eyes: Conjunctivae are normal. No scleral icterus.  Neck: Normal range of motion. Neck supple. No thyromegaly present.  Cardiovascular: Normal rate, regular rhythm and normal heart sounds.   Pulses:      Radial pulses are 2+ on the right side, and 2+ on the left side.  Pulmonary/Chest: Effort normal and breath sounds normal.  Abdominal: Soft. Normal appearance and bowel sounds are normal. There is tenderness in the right upper quadrant, right lower quadrant and periumbilical area. There is tenderness at McBurney's point. There is no rigidity, no rebound, no guarding, no CVA tenderness and negative Murphy's sign.  Lymphadenopathy:       Head (right side): No tonsillar, no preauricular, no posterior auricular and no occipital adenopathy present.       Head (left side): No tonsillar, no preauricular, no posterior auricular and no occipital adenopathy present.    She has no cervical adenopathy.       Right: No supraclavicular adenopathy present.       Left: No supraclavicular adenopathy present.  Neurological: She is alert and oriented to person, place, and time. No sensory deficit.  Skin: Skin is warm, dry and intact. No rash noted. No cyanosis or erythema. Nails show no clubbing.  Psychiatric: She has a normal mood and affect. Her speech is normal and behavior is normal.    Results for orders placed or performed in visit on 05/02/16  POCT CBC  Result Value Ref Range   WBC 6.9 4.6 - 10.2 K/uL   Lymph, poc 2.9 0.6 - 3.4   POC LYMPH PERCENT 42.1 10 - 50 %L   MID (cbc) 0.4 0 - 0.9   POC MID % 5.2 0 - 12 %M   POC Granulocyte 3.6 2 - 6.9   Granulocyte percent  52.7 37 - 80 %G   RBC 4.51 4.04 - 5.48 M/uL   Hemoglobin 13.8 12.2 - 16.2 g/dL   HCT, POC 39.5 37.7 - 47.9 %   MCV 87.5 80 - 97 fL   MCH, POC 30.6 27 - 31.2 pg   MCHC 35.0 31.8 - 35.4 g/dL   RDW, POC 13.3 %   Platelet Count, POC 273 142 - 424 K/uL   MPV 7.5 0 - 99.8 fL  POCT urinalysis dipstick  Result Value Ref Range   Color, UA yellow yellow   Clarity, UA clear clear   Glucose, UA negative negative   Bilirubin, UA negative negative   Ketones, POC UA negative negative   Spec Grav, UA 1.025    Blood, UA negative negative  pH, UA 7.5    Protein Ur, POC negative negative   Urobilinogen, UA 0.2    Nitrite, UA Negative Negative   Leukocytes, UA Negative Negative  POCT Microscopic Urinalysis ([facility])  Result Value Ref Range   WBC,UR,HPF,POC None None WBC/hpf   RBC,UR,HPF,POC None None RBC/hpf   Bacteria Few (A) None, Too numerous to count   Mucus Present (A) Absent   Epithelial Cells, UR Per Microscopy Few (A) None, Too numerous to count cells/hpf          Assessment & Plan:   1. Abdominal pain, RLQ (right lower quadrant) To CT now to evaluate for appendicitis. Await report. - POCT CBC - POCT urinalysis dipstick - POCT Microscopic Urinalysis ([facility]) - CT Abdomen Pelvis W Contrast; Future   Fara Chute, PA-C Physician Assistant-Certified [facility] [place]

## 2016-05-02 NOTE — Progress Notes (Signed)
Report of CT abdomen and pelvis called to PA [facility].  She spoke with patient and advised [place] she could go home.  IV removed and patient escorted to ED Lobby to her family.

## 2016-05-02 NOTE — Telephone Encounter (Signed)
Call report received.  Ct Abdomen Pelvis W Contrast  Result Date: 05/02/2016 CLINICAL DATA:  Right-sided abdominal cramping beginning last night. EXAM: CT ABDOMEN AND PELVIS WITH CONTRAST TECHNIQUE: Multidetector CT imaging of the abdomen and pelvis was performed using the standard protocol following bolus administration of intravenous contrast. CONTRAST:  12mL ISOVUE-300 IOPAMIDOL (ISOVUE-300) INJECTION 61% COMPARISON:  May 25, 2015 FINDINGS: Lower chest: No acute abnormality. Hepatobiliary: The patient is status post cholecystectomy. Intra and extrahepatic biliary duct dilatation is stable in the interval. The liver is otherwise unremarkable. The portal vein is well opacified. Pancreas: Unremarkable. No pancreatic ductal dilatation or surrounding inflammatory changes. Spleen: Normal in size without focal abnormality. Adrenals/Urinary Tract: There is a 4.5 mm stone in the lower pole the right kidney without hydronephrosis or perinephric stranding. No ureteral stones identified. No stones in the left kidney. No hydronephrosis or perinephric stranding. No ureterectasis or ureteral stones. The bladder is unremarkable. Stomach/Bowel: The appendix is well seen and normal. Fluid in the colon may be seen with diarrhea. No other acute colonic abnormality identified. The stomach and small bowel are normal. Vascular/Lymphatic: No adenopathy. The vessels including the aorta are normal. Reproductive: The patient is status post hysterectomy. The left adnexal cyst seen previously has resolved and a normal-appearing left ovary is probably present. No right ovary is identified. Other: No abdominal wall hernia or abnormality. No abdominopelvic ascites. Musculoskeletal: No acute or significant osseous findings. IMPRESSION: 1. Fluid in the colon may be seen with diarrhea. 2. The appendix is well seen with no appendicitis. No other acute abnormalities. 3. Nonobstructive stone in the lower right kidney. Electronically Signed    By: Dorise Bullion III M.D   On: 05/02/2016 16:10    Reviewed results with patient. Non-obstructing stone unlikely to be source of pain presently. Hydrate. OTC ibuprofen/acetaminophen. Anticipate loose stool. RTC if pain worsens/persists.

## 2016-05-02 NOTE — Patient Instructions (Addendum)
GO TO [facility] NOW AND REGISTER AS AN OUTPATIENT, [place] CONTRAST AND THEN SCAN EXPECT 3 HOUR TOTAL TIME [address] [place], [place] [address]    IF you received an x-ray today, you will receive an invoice from [facility]. Please contact [facility] at [phone] with questions or concerns regarding your invoice.   IF you received labwork today, you will receive an invoice from [place]. Please contact [facility] at [phone] with questions or concerns regarding your invoice.   Our billing staff will not be able to assist you with questions regarding bills from these companies.  You will be contacted with the lab results as soon as they are available. The fastest way to get your results is to activate your My Chart account. Instructions are located on the last page of this paperwork. If you have not heard from [place] regarding the results in 2 weeks, please contact this office.

## 2016-05-18 DIAGNOSIS — Z4789 Encounter for other orthopedic aftercare: Secondary | ICD-10-CM | POA: Diagnosis not present

## 2016-05-18 DIAGNOSIS — M25551 Pain in right hip: Secondary | ICD-10-CM | POA: Diagnosis not present

## 2016-05-21 ENCOUNTER — Ambulatory Visit (INDEPENDENT_AMBULATORY_CARE_PROVIDER_SITE_OTHER): Payer: Commercial Managed Care - HMO | Admitting: Physician Assistant

## 2016-05-21 ENCOUNTER — Telehealth: Payer: Self-pay

## 2016-05-21 VITALS — BP 130/78 | HR 90 | Resp 16 | Wt 181.4 lb

## 2016-05-21 DIAGNOSIS — N898 Other specified noninflammatory disorders of vagina: Secondary | ICD-10-CM | POA: Diagnosis not present

## 2016-05-21 DIAGNOSIS — N760 Acute vaginitis: Secondary | ICD-10-CM | POA: Diagnosis not present

## 2016-05-21 DIAGNOSIS — B9689 Other specified bacterial agents as the cause of diseases classified elsewhere: Secondary | ICD-10-CM | POA: Diagnosis not present

## 2016-05-21 LAB — POCT WET + KOH PREP
TRICH BY WET PREP: ABSENT
YEAST BY KOH: ABSENT
Yeast by wet prep: ABSENT

## 2016-05-21 MED ORDER — BORIC ACID POWD: 600.0000 mg | 0 refills | Status: DC

## 2016-05-21 NOTE — Progress Notes (Signed)
[facility] [address], [place] [address] [phone]  Date:  05/21/2016   Name:  Kendra Gonzalez   DOB:  11/18/1970   MRN:  [id number]  PCP:  Elizabeth Sauer    History of Present Illness:  Kendra Gonzalez is a 46 y.o. female patient who presents to [facility] for cc of vaginal discharge. Non-odorous, and white discharge.  Non-pruritic.  No abdominal pain.  No dysuria, hematuria, or frequency.   Patient has a hx of recurrent bv, and yeast.  Metronidazole has been unhelpful.  She ran out of the boric acid.    Patient Active Problem List   Diagnosis Date Noted  . Multinodular goiter 04/17/2015  . History of stomach ulcers 03/22/2015  . Low TSH level 03/22/2015  . Primary localized osteoarthrosis, pelvic region and thigh 05/02/2014  . Primary osteoarthritis of left hip 05/02/2014  . Chronic pain of left heel 04/23/2014  . Chronic pain syndrome 03/19/2014  . Asthma 01/04/2013  . Abdominal pain, chronic, right upper quadrant 06/23/2012  . Obesity (BMI 30.0-34.9) 10/12/2011  . Enthesopathy of hip region 01/22/2011  . 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 Radiculopathy 01/22/2011  . Trochanteric bursitis of left hip 01/22/2011  . HYPERLIPIDEMIA 02/16/2007  . ALLERGIC RHINITIS 02/16/2007  . GERD 02/16/2007    Past Medical History:  Diagnosis Date  . Allergy   . Arthritis   . Asthma   . Endometriosis   . GERD (gastroesophageal reflux disease)   . History of tetanus, diphtheria, and acellular pertussis booster vaccination (Tdap)    07/30/2010  . Hyperlipidemia   . Kidney stones   . Pancreatitis     Past Surgical History:  Procedure Laterality Date  . ABDOMINAL HYSTERECTOMY    . arthroscopic knee surgery     . carpel tunnel    . CHOLECYSTECTOMY    . ERCP  01/08/2012   Procedure: ENDOSCOPIC RETROGRADE CHOLANGIOPANCREATOGRAPHY (ERCP);  Surgeon: Beryle Beams, MD;  Location: [facility] ENDOSCOPY;  Service: Endoscopy;  Laterality: N/A;  . HIP SURGERY     . oophrectomy  2011   secondary to cysts  . TUBAL LIGATION      Social History  Substance Use Topics  . Smoking status: Never Smoker  . Smokeless tobacco: Never Used  . Alcohol use No    Family History  Problem Relation Age of Onset  . Heart disease Daughter   . Hypertension Father   . Hypertension Sister   . Hypertension Brother   . Hypertension Maternal Grandfather   . Heart disease Maternal Grandfather   . Hypertension Paternal Grandfather   . Hypertension Brother   . Hypertension Sister   . Thyroid disease Neg Hx     No Known Allergies  Medication list has been reviewed and updated.  Current Outpatient Prescriptions on File Prior to Visit  Medication Sig Dispense Refill  . acetaminophen (TYLENOL) 500 MG tablet Take 1,000 mg by mouth every 6 (six) hours as needed for mild pain or headache.    . albuterol (PROVENTIL HFA;VENTOLIN HFA) 108 (90 BASE) MCG/ACT inhaler Inhale 2 puffs into the lungs every 6 (six) hours as needed for wheezing or shortness of breath. 1 Inhaler 6  . Boric Acid POWD Place 600 mg capsule intravaginally once a week: Boric acid suppository (Patient taking differently: Place 600 mg vaginally as directed. Place 600 mg capsule intravaginally once a week as needed for irritation: Boric acid suppository) 180 g 6  . fluticasone-salmeterol (ADVAIR HFA) 115-21  MCG/ACT inhaler Inhale 2 puffs into the lungs 2 (two) times daily. (Patient taking differently: Inhale 2 puffs into the lungs 2 (two) times daily as needed (COPD). ) 1 Inhaler 11  . glucosamine-chondroitin 500-400 MG tablet Take 1 tablet by mouth 3 (three) times daily as needed (joint pain).     Marland Kitchen ipratropium (ATROVENT) 0.03 % nasal spray Place 2 sprays into the nose 4 (four) times daily. 30 mL 0  . levocetirizine (XYZAL) 5 MG tablet Take 1 tablet (5 mg total) by mouth every evening. 30 tablet 6  . magnesium gluconate (MAGONATE) 500 MG tablet Take 500 mg by mouth at bedtime.    . Multiple  Vitamins-Minerals (MULTIVITAMIN WITH MINERALS) tablet Take 1 tablet by mouth daily.    . pantoprazole (PROTONIX) 40 MG tablet Take 1 tablet (40 mg total) by mouth daily. (Patient taking differently: Take 40 mg by mouth daily as needed (heartburn). ) 30 tablet 5  . VOLTAREN 1 % GEL     . methimazole (TAPAZOLE) 5 MG tablet Take 1 tablet (5 mg total) by mouth 2 (two) times a week. (Patient not taking: Reported on 05/21/2016) 10 tablet 3   No current facility-administered medications on file prior to visit.     ROS ROS otherwise unremarkable unless listed above.   Physical Examination: BP 130/78 (Cuff Size: Normal)   Pulse 90   Resp 16   Wt 181 lb 6.4 oz (82.3 kg)   LMP 06/06/1996   SpO2 100%   BMI 30.66 kg/m  Ideal Body Weight:    Physical Exam  Constitutional: She is oriented to person, place, and time. She appears well-developed and well-nourished. No distress.  HENT:  Head: Normocephalic and atraumatic.  Right Ear: External ear normal.  Left Ear: External ear normal.  Eyes: Conjunctivae and EOM are normal. Pupils are equal, round, and reactive to light.  Cardiovascular: Normal rate.   Pulmonary/Chest: Effort normal. No respiratory distress.  Neurological: She is alert and oriented to person, place, and time.  Skin: She is not diaphoretic.  Psychiatric: She has a normal mood and affect. Her behavior is normal.   Results for orders placed or performed in visit on 05/21/16  POCT Wet + KOH Prep  Result Value Ref Range   Yeast by KOH Absent Absent   Yeast by wet prep Absent Absent   WBC by wet prep None (A) Few   Clue Cells Wet Prep HPF POC Few (A) None   Trich by wet prep Absent Absent   Bacteria Wet Prep HPF POC Many (A) Few   Epithelial Cells By [employer] Pref ([facility]) Many (A) None, Few, Too numerous to count   RBC,UR,HPF,POC None None RBC/hpf    Assessment and Plan: Tayona Beckert Corporan is a 46 y.o. female who is here today for cc of vaginal discharge. Will refill the boric  acid. Vaginal discharge - Plan: POCT Wet + KOH Prep  Bacterial vaginosis - Plan: Boric Acid POWD, DISCONTINUED: Boric Acid POWD  Ivar Drape, PA-C [facility] 1/27/20188:23 PM

## 2016-05-21 NOTE — Patient Instructions (Addendum)
Bacterial Vaginosis Bacterial vaginosis is an infection of the vagina. It happens when too many germs (bacteria) grow in the vagina. This infection puts you at risk for infections from sex (STIs). Treating this infection can lower your risk for some STIs. You should also treat this if you are pregnant. It can cause your baby to be born early. Follow these instructions at home: Medicines  Take over-the-counter and prescription medicines only as told by your doctor.  Take or use your antibiotic medicine as told by your doctor. Do not stop taking or using it even if you start to feel better. General instructions  If you your sexual partner is a woman, tell her that you have this infection. She needs to get treatment if she has symptoms. If you have a female partner, he does not need to be treated.  During treatment: ? Avoid sex. ? Do not douche. ? Avoid alcohol as told. ? Avoid breastfeeding as told.  Drink enough fluid to keep your pee (urine) clear or pale yellow.  Keep your vagina and butt (rectum) clean. ? Wash the area with warm water every day. ? Wipe from front to back after you use the toilet.  Keep all follow-up visits as told by your doctor. This is important. Preventing this condition  Do not douche.  Use only warm water to wash around your vagina.  Use protection when you have sex. This includes: ? Latex condoms. ? Dental dams.  Limit how many people you have sex with. It is best to only have sex with the same person (be monogamous).  Get tested for STIs. Have your partner get tested.  Wear underwear that is cotton or lined with cotton.  Avoid tight pants and pantyhose. This is most important in summer.  Do not use any products that have nicotine or tobacco in them. These include cigarettes and e-cigarettes. If you need help quitting, ask your doctor.  Do not use illegal drugs.  Limit how much alcohol you drink. Contact a doctor if:  Your symptoms do not get  better, even after you are treated.  You have more discharge or pain when you pee (urinate).  You have a fever.  You have pain in your belly (abdomen).  You have pain with sex.  Your bleed from your vagina between periods. Summary  This infection happens when too many germs (bacteria) grow in the vagina.  Treating this condition can lower your risk for some infections from sex (STIs).  You should also treat this if you are pregnant. It can cause early (premature) birth.  Do not stop taking or using your antibiotic medicine even if you start to feel better. This information is not intended to replace advice given to you by your health care provider. Make sure you discuss any questions you have with your health care provider. Document Released: 01/21/2008 Document Revised: 12/28/2015 Document Reviewed: 12/28/2015 Elsevier Interactive Patient Education  2017 Elsevier Inc.     IF you received an x-ray today, you will receive an invoice from [facility]. Please contact [facility] at [phone] with questions or concerns regarding your invoice.   IF you received labwork today, you will receive an invoice from [facility]. Please contact [facility] at [phone] with questions or concerns regarding your invoice.   Our billing staff will not be able to assist you with questions regarding bills from these companies.  You will be contacted with the lab results as soon as they are available. The fastest way to   your results is to activate your My Chart account. Instructions are located on the last page of this paperwork. If you have not heard from [place] regarding the results in 2 weeks, please contact this office.

## 2016-05-21 NOTE — Telephone Encounter (Signed)
The boric acid powd needs to be filled at [facility] number [phone]

## 2016-05-22 ENCOUNTER — Telehealth: Payer: Self-pay

## 2016-05-22 NOTE — Telephone Encounter (Signed)
Was resent  

## 2016-05-22 NOTE — Telephone Encounter (Signed)
Fax - [facility] - unable to get these - can you change?    Order for Boric Acid powder - syppository

## 2016-05-23 ENCOUNTER — Encounter: Payer: Self-pay | Admitting: Physician Assistant

## 2016-05-23 NOTE — Telephone Encounter (Signed)
This was resent to [facility].  If it can be done at the other facility then send it there.

## 2016-05-25 NOTE — Telephone Encounter (Signed)
Patient aware.

## 2016-06-15 ENCOUNTER — Ambulatory Visit (INDEPENDENT_AMBULATORY_CARE_PROVIDER_SITE_OTHER): Payer: Commercial Managed Care - HMO | Admitting: Orthopaedic Surgery

## 2016-06-15 ENCOUNTER — Encounter (INDEPENDENT_AMBULATORY_CARE_PROVIDER_SITE_OTHER): Payer: Self-pay | Admitting: Orthopaedic Surgery

## 2016-06-15 ENCOUNTER — Ambulatory Visit (INDEPENDENT_AMBULATORY_CARE_PROVIDER_SITE_OTHER): Payer: Commercial Managed Care - HMO

## 2016-06-15 VITALS — BP 137/92 | HR 89 | Resp 14 | Ht 64.0 in | Wt 180.0 lb

## 2016-06-15 DIAGNOSIS — M25562 Pain in left knee: Secondary | ICD-10-CM | POA: Diagnosis not present

## 2016-06-15 DIAGNOSIS — M25561 Pain in right knee: Principal | ICD-10-CM

## 2016-06-15 DIAGNOSIS — G8929 Other chronic pain: Secondary | ICD-10-CM

## 2016-06-15 MED ORDER — BUPIVACAINE HCL 0.5 % IJ SOLN
3.0000 mL | INTRAMUSCULAR | Status: AC | PRN
  Administered 2016-06-15: 3 mL via INTRA_ARTICULAR

## 2016-06-15 MED ORDER — METHYLPREDNISOLONE ACETATE 40 MG/ML IJ SUSP
80.0000 mg | INTRAMUSCULAR | Status: AC | PRN
  Administered 2016-06-15: 80 mg

## 2016-06-15 MED ORDER — LIDOCAINE HCL 1 % IJ SOLN
5.0000 mL | INTRAMUSCULAR | Status: AC | PRN
  Administered 2016-06-15: 5 mL

## 2016-06-15 NOTE — Progress Notes (Signed)
Office Visit Note   Patient: Kendra Gonzalez           Date of Birth: [birth year]           MRN: [id number] Visit Date: 06/15/2016              Requested by: Mancel Bale, PA-C [place], [place] [id number] PCP: Windell Hummingbird, PA-C   Assessment & Plan: Visit Diagnoses: bilateral knee osteoarthritis  Plan: Cortisone injection left knee in the lateral parapatellar region, follow-up in one month if no improvement. Long discussion about the pathology that I believe is consistent with osteoarthritis but particularly at the patellofemoral joint and the left knee.  Follow-Up Instructions: No Follow-up on file.   Orders:  No orders of the defined types were placed in this encounter.  No orders of the defined types were placed in this encounter.     Procedures: Large Joint Inj Date/Time: 06/15/2016 10:30 AM Performed by: Garald Balding Authorized by: Garald Balding   Consent Given by:  Patient Timeout: prior to procedure the correct patient, procedure, and site was verified   Indications:  Pain and joint swelling Location:  Knee Site:  L knee Prep: patient was prepped and draped in usual sterile fashion   Needle Size:  25 G Needle Length:  1.5 inches Approach:  Anteromedial Ultrasound Guidance: No   Fluoroscopic Guidance: No   Arthrogram: No   Medications:  5 mL lidocaine 1 %; 80 mg methylPREDNISolone acetate 40 MG/ML; 3 mL bupivacaine 0.5 % Aspiration Attempted: No   Patient tolerance:  Patient tolerated the procedure well with no immediate complications     Clinical Data: No additional findings.   Subjective: No chief complaint on file.   Pt presents with BIL knee pain, both same pain level.  She denies any injury, has been seen in 2016 here for chronic knee pain that has become increasingly worse x 1 week. MRI printed from 11/2014  Patient has had a recent exacerbation of bilateral knee pain left greater than right. She does work as a Mining engineer bending stooping and squatting. In evaluating the past for the left knee pain with MRI scan demonstrating predominantly lateral patellar tilt and lateral patellar pressure syndrome with some arthritis. Has responded in the past cortisone injections  Review of Systems   Objective: Vital Signs: LMP 06/06/1996   Physical Exam  Ortho Exam left knee exam with positive pain along the lateral patella facet. There is some mild grinding. No effusion. No medial lateral joint pain. No instability. No swelling distally. Neurovascular exam intact. Full extension and flexion over 90.  Right knee exam without effusion. Mild patella pain laterally. no medial or lateral joint pain.  Full extension and flexion. No instability. No swelling distally. Neurovascular exam intact.  Specialty Comments:  No specialty comments available.  Imaging: No results found.   PMFS History: Patient Active Problem List   Diagnosis Date Noted  . Multinodular goiter 04/17/2015  . History of stomach ulcers 03/22/2015  . Low TSH level 03/22/2015  . Primary localized osteoarthrosis, pelvic region and thigh 05/02/2014  . Primary osteoarthritis of left hip 05/02/2014  . Chronic pain of left heel 04/23/2014  . Chronic pain syndrome 03/19/2014  . Asthma 01/04/2013  . Abdominal pain, chronic, right upper quadrant 06/23/2012  . Obesity (BMI 30.0-34.9) 10/12/2011  . Enthesopathy of hip region 01/22/2011  . 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 Radiculopathy 01/22/2011  . Trochanteric bursitis of left hip 01/22/2011  .  HYPERLIPIDEMIA 02/16/2007  . ALLERGIC RHINITIS 02/16/2007  . GERD 02/16/2007   Past Medical History:  Diagnosis Date  . Allergy   . Arthritis   . Asthma   . Endometriosis   . GERD (gastroesophageal reflux disease)   . History of tetanus, diphtheria, and acellular pertussis booster vaccination (Tdap)    07/30/2010  . Hyperlipidemia   . Kidney stones   . Pancreatitis     Family History    Problem Relation Age of Onset  . Heart disease Daughter   . Hypertension Father   . Hypertension Sister   . Hypertension Brother   . Hypertension Maternal Grandfather   . Heart disease Maternal Grandfather   . Hypertension Paternal Grandfather   . Hypertension Brother   . Hypertension Sister   . Thyroid disease Neg Hx     Past Surgical History:  Procedure Laterality Date  . ABDOMINAL HYSTERECTOMY    . arthroscopic knee surgery     . carpel tunnel    . CHOLECYSTECTOMY    . ERCP  01/08/2012   Procedure: ENDOSCOPIC RETROGRADE CHOLANGIOPANCREATOGRAPHY (ERCP);  Surgeon: Beryle Beams, MD;  Location: [facility] ENDOSCOPY;  Service: Endoscopy;  Laterality: N/A;  . HIP SURGERY    . oophrectomy  2011   secondary to cysts  . 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 Smoking status: Never Smoker  . Smokeless tobacco: Never Used  . Alcohol use No  . Drug use: No  . Sexual activity: Yes    Birth control/ protection: None

## 2016-07-01 DIAGNOSIS — R7303 Prediabetes: Secondary | ICD-10-CM | POA: Diagnosis not present

## 2016-07-01 DIAGNOSIS — E559 Vitamin D deficiency, unspecified: Secondary | ICD-10-CM | POA: Diagnosis not present

## 2016-10-05 ENCOUNTER — Ambulatory Visit (INDEPENDENT_AMBULATORY_CARE_PROVIDER_SITE_OTHER): Payer: Commercial Managed Care - HMO | Admitting: Physician Assistant

## 2016-10-05 ENCOUNTER — Ambulatory Visit (HOSPITAL_COMMUNITY)
Admission: RE | Admit: 2016-10-05 | Discharge: 2016-10-05 | Disposition: A | Discharge status: Home / Self Care | Payer: Commercial Managed Care - HMO | Source: Ambulatory Visit | Attending: Physician Assistant | Admitting: Physician Assistant

## 2016-10-05 ENCOUNTER — Encounter: Payer: Self-pay | Admitting: Physician Assistant

## 2016-10-05 VITALS — BP 120/90 | HR 67 | Temp 98.3°F | Resp 16 | Ht 64.25 in | Wt 178.0 lb

## 2016-10-05 DIAGNOSIS — R51 Headache: Secondary | ICD-10-CM | POA: Insufficient documentation

## 2016-10-05 DIAGNOSIS — R946 Abnormal results of thyroid function studies: Secondary | ICD-10-CM | POA: Diagnosis not present

## 2016-10-05 DIAGNOSIS — R519 Headache, unspecified: Secondary | ICD-10-CM

## 2016-10-05 DIAGNOSIS — R7989 Other specified abnormal findings of blood chemistry: Secondary | ICD-10-CM

## 2016-10-05 MED ORDER — KETOROLAC TROMETHAMINE 30 MG/ML IJ SOLN
30.0000 mg | Freq: Once | INTRAMUSCULAR | Status: AC
  Administered 2016-10-05: 30 mg via INTRAMUSCULAR

## 2016-10-05 MED ORDER — ONDANSETRON 4 MG PO TBDP
8.0000 mg | ORAL_TABLET | Freq: Once | ORAL | Status: AC
  Administered 2016-10-05: 8 mg via ORAL

## 2016-10-05 NOTE — Progress Notes (Signed)
Patient ID: Kendra Gonzalez, female    DOB: Jun 21, 1970, 46 y.o.   MRN: [id number]  PCP: Mancel Bale, PA-C  Chief Complaint  Patient presents with  . Migraine    x days and tightness in chest, pain level 7/10 for migraine and chest tightness has eased off some per patient    Subjective:   Presents for evaluation of headache.  HA began 3 days ago. Rates the pain as a 7-8/10, with 0 representing NO pain and 10 the worst pain she can imagine. Started in the back of the head and the neck. Has moved forward to the front, behind the LEFT eye. Acetaminophen without benefit. Photophobia and phonophobia. Nausea, no vomiting. Has developed a tremor in the RIGHT hand. No difficulty swallowing. No facial droop, but LEFT face looks swollen to her husband.  No previous HA like this. When she gets a HA, about once a month, acetaminophen usually works.  LEFT ear starting to ache now. No other URI-type symptoms. No cough. No fever, chills. Some chest tightness this morning, but only when she is lying down. Not present when she is sitting or standing.  Ran out of tapazole about 1 week ago.  Is taking a phlebotomy course in [place]. Has a lot of practice sticks in her arms.   Review of Systems As above.    Patient Active Problem List   Diagnosis Date Noted  . Multinodular goiter 04/17/2015  . History of stomach ulcers 03/22/2015  . Low TSH level 03/22/2015  . Primary localized osteoarthrosis, pelvic region and thigh 05/02/2014  . Primary osteoarthritis of left hip 05/02/2014  . Chronic pain of left heel 04/23/2014  . Chronic pain syndrome 03/19/2014  . Asthma 01/04/2013  . Abdominal pain, chronic, right upper quadrant 06/23/2012  . Obesity (BMI 30.0-34.9) 10/12/2011  . Enthesopathy of hip region 01/22/2011  . 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 Radiculopathy 01/22/2011  . Trochanteric bursitis of left hip 01/22/2011  . HYPERLIPIDEMIA 02/16/2007  . ALLERGIC  RHINITIS 02/16/2007  . GERD 02/16/2007     Prior to Admission medications   Medication Sig Start Date End Date Taking? Authorizing Provider  acetaminophen (TYLENOL) 500 MG tablet Take 1,000 mg by mouth every 6 (six) hours as needed for mild pain or headache.   Yes [provider]  albuterol (PROVENTIL HFA;VENTOLIN HFA) 108 (90 BASE) MCG/ACT inhaler Inhale 2 puffs into the lungs every 6 (six) hours as needed for wheezing or shortness of breath. 03/19/15  Yes Ezekiel Slocumb, PA-C  Boric Acid POWD Place 600 mg vaginally as directed. Place 600 mg capsule intravaginally once a week as needed for irritation: Boric acid suppository 05/21/16  Yes English, Stephanie D, PA  fluticasone-salmeterol (ADVAIR HFA) 115-21 MCG/ACT inhaler Inhale 2 puffs into the lungs 2 (two) times daily. Patient taking differently: Inhale 2 puffs into the lungs 2 (two) times daily as needed (COPD).  03/19/15  Yes Ezekiel Slocumb, PA-C  glucosamine-chondroitin 500-400 MG tablet Take 1 tablet by mouth 3 (three) times daily as needed (joint pain).    Yes [provider]  ipratropium (ATROVENT) 0.03 % nasal spray Place 2 sprays into the nose 4 (four) times daily. 10/19/15  Yes Shawnee Knapp, MD  levocetirizine (XYZAL) 5 MG tablet Take 1 tablet (5 mg total) by mouth every evening. 03/09/14  Yes Le, Thao P, DO  magnesium gluconate (MAGONATE) 500 MG tablet Take 500 mg by mouth at bedtime.   Yes [provider]  Multiple Vitamins-Minerals (MULTIVITAMIN WITH MINERALS) tablet Take 1 tablet by mouth daily.   Yes [provider]  pantoprazole (PROTONIX) 40 MG tablet Take 1 tablet (40 mg total) by mouth daily. Patient taking differently: Take 40 mg by mouth daily as needed (heartburn).  03/19/15  Yes Ezekiel Slocumb, PA-C  VOLTAREN 1 % GEL  09/13/15  Yes [provider]  methimazole (TAPAZOLE) 5 MG tablet Take 1 tablet (5 mg total) by mouth 2 (two) times a week. Patient not taking: Reported on 10/05/2016  05/28/15   Renato Shin, MD     No Known Allergies     Objective:  Physical Exam  Constitutional: She is oriented to person, place, and time. She appears well-developed and well-nourished. She is active and cooperative. No distress.  BP (!) 147/83 (BP Location: Right Arm, Patient Position: Sitting, Cuff Size: Normal)   Pulse 67   Temp 98.3 F (36.8 C) (Oral)   Resp 16   Ht 5' 4.25" (1.632 m)   Wt 178 lb (80.7 kg)   LMP 06/06/1996   SpO2 100%   BMI 30.32 kg/m  I find the patient lying down in a darkened room, hand over her eyes.  HENT:  Head: Normocephalic and atraumatic.  Right Ear: Hearing, tympanic membrane, external ear and ear canal normal.  Left Ear: Hearing, tympanic membrane, external ear and ear canal normal.  Nose: Nose normal.  Mouth/Throat: Uvula is midline, oropharynx is clear and moist and mucous membranes are normal. No oral lesions. Normal dentition. No uvula swelling.  Eyes: Conjunctivae, EOM and lids are normal. Pupils are equal, round, and reactive to light. No scleral icterus.  Fundoscopic exam:      The right eye shows no hemorrhage and no papilledema. The right eye shows red reflex.       The left eye shows no hemorrhage and no papilledema. The left eye shows red reflex.  Neck: Normal range of motion, full passive range of motion without pain and phonation normal. Neck supple. Muscular tenderness (mild tenderness of the posterior neck to occiput) present. No thyromegaly present.  Cardiovascular: Normal rate, regular rhythm and normal heart sounds.   Pulses:      Radial pulses are 2+ on the right side, and 2+ on the left side.  Pulmonary/Chest: Effort normal and breath sounds normal.  Lymphadenopathy:       Head (right side): No tonsillar, no preauricular, no posterior auricular and no occipital adenopathy present.       Head (left side): No tonsillar, no preauricular, no posterior auricular and no occipital adenopathy present.    She has no cervical  adenopathy.       Right: No supraclavicular adenopathy present.       Left: No supraclavicular adenopathy present.  Neurological: She is alert and oriented to person, place, and time. She has normal strength. No cranial nerve deficit or sensory deficit. GCS eye subscore is 4. GCS verbal subscore is 5. GCS motor subscore is 6.  Reflex Scores:      Bicep reflexes are 2+ on the right side and 2+ on the left side.      Patellar reflexes are 2+ on the right side and 2+ on the left side.      Achilles reflexes are 2+ on the right side and 2+ on the left side. Skin: Skin is warm, dry and intact. No rash noted. No cyanosis or erythema. Nails show no clubbing.  Psychiatric: Her speech is normal and behavior is normal.  Judgment and thought content normal. Her mood appears not anxious. Her affect is blunt. Her affect is not angry, not labile and not inappropriate. Cognition and memory are normal. She does not exhibit a depressed mood.       Assessment & Plan:   Problem List Items Addressed This Visit    Low TSH level    Has run out of tapazole and is overdue for labs and follow-up with endocrinology. Update TSH and T4 today and forward to her specialist. Encouraged her to contact him to schedule follow-up and obtain a refill of tapazole.      Relevant Orders   TSH   T4, free    Other Visit Diagnoses    Acute intractable headache, unspecified headache type    -  Primary   Toradol and ondansetron here today. CT scan head today. If HA persists, would start topiramate and schedule with neurology.   Relevant Medications   ketorolac (TORADOL) 30 MG/ML injection 30 mg   ondansetron (ZOFRAN-ODT) disintegrating tablet 8 mg   Other Relevant Orders   CT Head Wo Contrast       Return if symptoms worsen or fail to improve.   Fara Chute, PA-C [facility]

## 2016-10-05 NOTE — Assessment & Plan Note (Signed)
Has run out of tapazole and is overdue for labs and follow-up with endocrinology. Update TSH and T4 today and forward to her specialist. Encouraged her to contact him to schedule follow-up and obtain a refill of tapazole.

## 2016-10-05 NOTE — Patient Instructions (Addendum)
CT @ [facility] go to Radiology Department on the First Floor. Please be advised you are being worked in, so they ask that you be patient with them and you will be seen.    We recommend that you schedule a mammogram for breast cancer screening. Typically, you do not need a referral to do this. Please contact a local imaging center to schedule your mammogram.  [facility] - [phone]  *ask for the Radiology Department [facility] - [phone] or [phone]  [facility] - [phone] [place] [phone] [facility] - [phone]  *ask for the [facility] - [phone]  *ask for the Radiology Department [facility] - [phone]  *ask for the [facility] - [phone]    IF you received an x-ray today, you will receive an invoice from [facility] Radiology. Please contact [facility] Radiology at [phone] with questions or concerns regarding your invoice.   IF you received labwork today, you will receive an invoice from [facility]. Please contact [facility] at [phone] with questions or concerns regarding your invoice.   Our billing staff will not be able to assist you with questions regarding bills from these companies.  You will be contacted with the lab results as soon as they are available. The fastest way to get your results is to activate your My Chart account. Instructions are located on the last page of this paperwork. If you have not heard from [place] regarding the results in 2 weeks, please contact this office.

## 2016-10-06 LAB — TSH: TSH: 0.035 u[IU]/mL — AB (ref 0.450–4.500)

## 2016-10-06 LAB — T4, FREE: FREE T4: 1.16 ng/dL (ref 0.82–1.77)

## 2016-11-03 ENCOUNTER — Encounter: Payer: Self-pay | Admitting: Endocrinology

## 2016-11-03 ENCOUNTER — Ambulatory Visit (INDEPENDENT_AMBULATORY_CARE_PROVIDER_SITE_OTHER): Payer: Commercial Managed Care - HMO | Admitting: Endocrinology

## 2016-11-03 VITALS — BP 110/74 | HR 88 | Ht 64.25 in | Wt 183.0 lb

## 2016-11-03 DIAGNOSIS — E0590 Thyrotoxicosis, unspecified without thyrotoxic crisis or storm: Secondary | ICD-10-CM | POA: Diagnosis not present

## 2016-11-03 DIAGNOSIS — E042 Nontoxic multinodular goiter: Secondary | ICD-10-CM

## 2016-11-03 LAB — TSH: TSH: 0.06 u[IU]/mL — AB (ref 0.35–4.50)

## 2016-11-03 LAB — T4, FREE: FREE T4: 0.59 ng/dL — AB (ref 0.60–1.60)

## 2016-11-03 MED ORDER — METHIMAZOLE 5 MG PO TABS: 5.0000 mg | ORAL_TABLET | Freq: Every day | ORAL | 3 refills | Status: DC

## 2016-11-03 NOTE — Patient Instructions (Addendum)
blood tests are requested for you today.  We'll let you know about the results. Based on the results, you may need to resume the methimazole.   if ever you have fever while taking methimazole, stop it and call us, even if the reason is obvious, because of the risk of a rare side-effect.  Please come back for a follow-up appointment in 3 months.  We can reconsider the radioactive iodine in the future if you wish.  Let's recheck the ultrasound.  you will receive a phone call, about a day and time for an appointment.

## 2016-11-03 NOTE — Progress Notes (Signed)
Subjective:    Patient ID: Kendra Gonzalez, female    DOB: [birth year], 46 y.o.   MRN: [id number]  HPI Pt returns for f/u of hyperthyroidism (due to multinodular goiter; dx'ed early 2016; she had bilat bxs (Beth. Cat. 2); she was rx'ed tapazole; she declines RAI).  She ran out of tapazole a few mos ago.  pt states she feels well in general.   Past Medical History:  Diagnosis Date  . Allergy   . Arthritis   . Asthma   . Endometriosis   . GERD (gastroesophageal reflux disease)   . History of tetanus, diphtheria, and acellular pertussis booster vaccination (Tdap)    07/30/2010  . Hyperlipidemia   . Kidney stones   . Pancreatitis     Past Surgical History:  Procedure Laterality Date  . ABDOMINAL HYSTERECTOMY    . arthroscopic knee surgery     . carpel tunnel    . CHOLECYSTECTOMY    . ERCP  01/08/2012   Procedure: ENDOSCOPIC RETROGRADE CHOLANGIOPANCREATOGRAPHY (ERCP);  Surgeon: Beryle Beams, MD;  Location: [facility] ENDOSCOPY;  Service: Endoscopy;  Laterality: N/A;  . HIP SURGERY    . oophrectomy  2011   secondary to cysts  . TUBAL LIGATION      Social History   Social History  . Marital status: Married    Spouse name: Fritz Pickerel  . Number of children: 2  . Years of education: N/A   Occupational History  . [employer]   Social History Main Topics  . Smoking status: Never Smoker  . Smokeless tobacco: Never Used  . Alcohol use No  . Drug use: No  . Sexual activity: Yes    Birth control/ protection: None   Other Topics Concern  . Not on file   Social History Narrative   Works as a Education officer, environmental.     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   Adult children live in [place], [place] and [place].    Current Outpatient Prescriptions on File Prior to Visit  Medication Sig Dispense Refill  . acetaminophen (TYLENOL) 500 MG tablet Take 1,000 mg by mouth every 6 (six) hours as needed for mild pain or headache.    . albuterol (PROVENTIL HFA;VENTOLIN HFA) 108 (90 BASE) MCG/ACT  inhaler Inhale 2 puffs into the lungs every 6 (six) hours as needed for wheezing or shortness of breath. 1 Inhaler 6  . Boric Acid POWD Place 600 mg vaginally as directed. Place 600 mg capsule intravaginally once a week as needed for irritation: Boric acid suppository 113 g 0  . fluticasone-salmeterol (ADVAIR HFA) 115-21 MCG/ACT inhaler Inhale 2 puffs into the lungs 2 (two) times daily. (Patient taking differently: Inhale 2 puffs into the lungs 2 (two) times daily as needed (COPD). ) 1 Inhaler 11  . glucosamine-chondroitin 500-400 MG tablet Take 1 tablet by mouth 3 (three) times daily as needed (joint pain).     Marland Kitchen ipratropium (ATROVENT) 0.03 % nasal spray Place 2 sprays into the nose 4 (four) times daily. 30 mL 0  . levocetirizine (XYZAL) 5 MG tablet Take 1 tablet (5 mg total) by mouth every evening. 30 tablet 6  . magnesium gluconate (MAGONATE) 500 MG tablet Take 500 mg by mouth at bedtime.    . Multiple Vitamins-Minerals (MULTIVITAMIN WITH MINERALS) tablet Take 1 tablet by mouth daily.    . pantoprazole (PROTONIX) 40 MG tablet Take 1 tablet (40 mg total) by mouth daily. (Patient taking differently: Take 40 mg by mouth daily  as needed (heartburn). ) 30 tablet 5  . VOLTAREN 1 % GEL      No current facility-administered medications on file prior to visit.     No Known Allergies  Family History  Problem Relation Age of Onset  . Heart disease Daughter   . Hypertension Father   . Hypertension Sister   . Hypertension Brother   . Hypertension Maternal Grandfather   . Heart disease Maternal Grandfather   . Hypertension Paternal Grandfather   . Hypertension Brother   . Hypertension Sister   . Thyroid disease Neg Hx     BP 110/74   Pulse 88   Ht 5' 4.25" (1.632 m)   Wt 183 lb (83 kg)   LMP 06/06/1996   SpO2 99%   BMI 31.17 kg/m    Review of Systems Denies leg edema    Objective:   Physical Exam VITAL SIGNS:  See vs page GENERAL: no distress NECK: thyroid is not enlarged.  I can  palpate the LLP nodule (approx 1 cm).   Lab Results  Component Value Date   TSH 0.06 (L) 11/03/2016      Assessment & Plan:  Hyperthyroidism: worse off tapazole.  I have sent a prescription to your pharmacy, for tapazole. Multinodular goiter: due for recheck [place].

## 2016-11-05 ENCOUNTER — Other Ambulatory Visit (INDEPENDENT_AMBULATORY_CARE_PROVIDER_SITE_OTHER): Payer: Self-pay | Admitting: Orthopedic Surgery

## 2016-11-09 ENCOUNTER — Ambulatory Visit: Payer: Commercial Managed Care - HMO | Admitting: Physician Assistant

## 2016-11-10 DIAGNOSIS — N644 Mastodynia: Secondary | ICD-10-CM | POA: Diagnosis not present

## 2016-11-14 DIAGNOSIS — H04123 Dry eye syndrome of bilateral lacrimal glands: Secondary | ICD-10-CM | POA: Diagnosis not present

## 2016-11-16 ENCOUNTER — Ambulatory Visit
Admission: RE | Admit: 2016-11-16 | Discharge: 2016-11-16 | Disposition: A | Discharge status: Home / Self Care | Payer: Commercial Managed Care - HMO | Source: Ambulatory Visit | Attending: Endocrinology | Admitting: Endocrinology

## 2016-11-16 DIAGNOSIS — E042 Nontoxic multinodular goiter: Secondary | ICD-10-CM | POA: Diagnosis not present

## 2016-12-17 ENCOUNTER — Encounter (INDEPENDENT_AMBULATORY_CARE_PROVIDER_SITE_OTHER): Payer: Self-pay | Admitting: Orthopaedic Surgery

## 2016-12-17 ENCOUNTER — Ambulatory Visit (INDEPENDENT_AMBULATORY_CARE_PROVIDER_SITE_OTHER): Payer: Commercial Managed Care - HMO | Admitting: Orthopaedic Surgery

## 2016-12-17 ENCOUNTER — Ambulatory Visit (INDEPENDENT_AMBULATORY_CARE_PROVIDER_SITE_OTHER): Payer: Commercial Managed Care - HMO

## 2016-12-17 VITALS — BP 122/69 | HR 96 | Resp 14 | Ht 64.0 in | Wt 175.0 lb

## 2016-12-17 DIAGNOSIS — M25552 Pain in left hip: Secondary | ICD-10-CM | POA: Diagnosis not present

## 2016-12-17 MED ORDER — METHYLPREDNISOLONE ACETATE 40 MG/ML IJ SUSP
80.0000 mg | INTRAMUSCULAR | Status: AC | PRN
Start: 2016-12-17 — End: 2016-12-17
  Administered 2016-12-17: 80 mg

## 2016-12-17 MED ORDER — BUPIVACAINE HCL 0.25 % IJ SOLN
2.0000 mL | INTRAMUSCULAR | Status: AC | PRN
  Administered 2016-12-17: 2 mL via INTRA_ARTICULAR

## 2016-12-17 MED ORDER — LIDOCAINE HCL 1 % IJ SOLN
5.0000 mL | INTRAMUSCULAR | Status: AC | PRN
  Administered 2016-12-17: 5 mL

## 2016-12-17 NOTE — Progress Notes (Signed)
Office Visit Note   Patient: Kendra Gonzalez           Date of Birth: Aug 11, 1970           MRN: [id number] Visit Date: 12/17/2016              Requested by: Mancel Bale, PA-C [address] [place], [place] [address] PCP: Mancel Bale, PA-C   Assessment & Plan: Visit Diagnoses:  1. Pain in left hip   Diagnostic possibilities include recurrent greater trochanteric bursitis versus osteoarthritis left hip Plan: We will plan to inject the greater trochanteric area and monitor her response. She was significantly improved post injection  Follow-Up Instructions: Return if symptoms worsen or fail to improve.   Orders:  Orders Placed This Encounter  Procedures  . XR HIP UNILAT W OR W/O PELVIS 2-3 VIEWS LEFT   No orders of the defined types were placed in this encounter.     Procedures: Large Joint Inj Date/Time: 12/17/2016 4:38 PM Performed by: Garald Balding Authorized by: Garald Balding   Consent Given by:  Patient Timeout: prior to procedure the correct patient, procedure, and site was verified   Indications:  Pain Location:  Hip Site:  L greater trochanter Prep: patient was prepped and draped in usual sterile fashion   Needle Size:  22 G Needle Length:  3.5 inches Approach:  Lateral Ultrasound Guidance: No   Fluoroscopic Guidance: No   Arthrogram: No   Medications:  5 mL lidocaine 1 %; 80 mg methylPREDNISolone acetate 40 MG/ML; 2 mL bupivacaine 0.25 % Aspiration Attempted: No   Patient tolerance:  Patient tolerated the procedure well with no immediate complications     Clinical Data: No additional findings.   Subjective: Chief Complaint  Patient presents with  . Left Hip - Pain, Numbness    Kendra Gonzalez is a 46 y o that presents with chronic Left hip pain x 5 years. She relates she has been dealing with the pain for years but now it hurts all day and night   Kendra Gonzalez underwent an exploration of the greater trochanteric bursa 2012 and did well  for a period of time. She actually has not  had much trouble up until just recently when she had insidious onset of pain along the lateral aspect of her left hip. She's had a prior MRI scan years ago that also revealed what may be some early arthritis of her hip. She is not experiencing any groin or anterior thigh pain appears mostly along the lateral aspect of her hip. No history of injury or trauma. No fever or chills. She's not having any back pain HPI  Review of Systems  Constitutional: Negative for chills, fatigue and fever.  Eyes: Negative for itching.  Respiratory: Negative for chest tightness and shortness of breath.   Cardiovascular: Negative for chest pain, palpitations and leg swelling.  Gastrointestinal: Negative for blood in stool, constipation and diarrhea.  Musculoskeletal: Positive for joint swelling. Negative for back pain, neck pain and neck stiffness.  Neurological: Negative for dizziness, weakness, numbness and headaches.  Hematological: Does not bruise/bleed easily.  Psychiatric/Behavioral: Positive for sleep disturbance. The patient is not nervous/anxious.      Objective: Vital Signs: BP 122/69   Pulse 96   Resp 14   Ht 5\' 4"  (1.626 m)   Wt 175 lb (79.4 kg)   LMP 06/06/1996   BMI 30.04 kg/m   Physical Exam  Ortho Exam experiences some pain with internal/external  rotation of the left hip along the lateral aspect of her hip without groin pain. Neurovascular exam intact. Straight leg raise negative. Does have significant localized tenderness over the greater trochanteric region of the left hip. No induration. No calf pain. No percussible tenderness of lumbar spine  Specialty Comments:  No specialty comments available.  Imaging: No results found.   PMFS History: Patient Active Problem List   Diagnosis Date Noted  . Hyperthyroidism 11/03/2016  . Multinodular goiter 04/17/2015  . History of stomach ulcers 03/22/2015  . Low TSH level 03/22/2015  . Primary  localized osteoarthrosis, pelvic region and thigh 05/02/2014  . Primary osteoarthritis of left hip 05/02/2014  . Chronic pain of left heel 04/23/2014  . Chronic pain syndrome 03/19/2014  . Asthma 01/04/2013  . Abdominal pain, chronic, right upper quadrant 06/23/2012  . Obesity (BMI 30.0-34.9) 10/12/2011  . Enthesopathy of hip region 01/22/2011  . 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 Radiculopathy 01/22/2011  . Trochanteric bursitis of left hip 01/22/2011  . HYPERLIPIDEMIA 02/16/2007  . ALLERGIC RHINITIS 02/16/2007  . GERD 02/16/2007   Past Medical History:  Diagnosis Date  . Allergy   . Arthritis   . Asthma   . Endometriosis   . GERD (gastroesophageal reflux disease)   . History of tetanus, diphtheria, and acellular pertussis booster vaccination (Tdap)    07/30/2010  . Hyperlipidemia   . Kidney stones   . Pancreatitis     Family History  Problem Relation Age of Onset  . Heart disease Daughter   . Hypertension Father   . Hypertension Sister   . Hypertension Brother   . Hypertension Maternal Grandfather   . Heart disease Maternal Grandfather   . Hypertension Paternal Grandfather   . Hypertension Brother   . Hypertension Sister   . Thyroid disease Neg Hx     Past Surgical History:  Procedure Laterality Date  . ABDOMINAL HYSTERECTOMY    . arthroscopic knee surgery     . carpel tunnel    . CHOLECYSTECTOMY    . ERCP  01/08/2012   Procedure: ENDOSCOPIC RETROGRADE CHOLANGIOPANCREATOGRAPHY (ERCP);  Surgeon: Beryle Beams, MD;  Location: [facility];  Service: Endoscopy;  Laterality: N/A;  . HIP SURGERY    . oophrectomy  2011   secondary to cysts  . 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 Smoking status: Never Smoker  . Smokeless tobacco: Never Used  . Alcohol use No  . Drug use: No  . Sexual activity: Yes    Birth control/ protection: None

## 2017-02-05 ENCOUNTER — Encounter (INDEPENDENT_AMBULATORY_CARE_PROVIDER_SITE_OTHER): Payer: Self-pay | Admitting: Orthopaedic Surgery

## 2017-02-05 ENCOUNTER — Ambulatory Visit (INDEPENDENT_AMBULATORY_CARE_PROVIDER_SITE_OTHER): Payer: Commercial Managed Care - HMO | Admitting: Orthopaedic Surgery

## 2017-02-05 VITALS — BP 125/78 | HR 92 | Resp 14 | Ht 64.0 in | Wt 175.0 lb

## 2017-02-05 DIAGNOSIS — M25552 Pain in left hip: Secondary | ICD-10-CM

## 2017-02-05 MED ORDER — TRAMADOL HCL 50 MG PO TABS: ORAL_TABLET | ORAL | 0 refills | Status: DC

## 2017-02-05 NOTE — Addendum Note (Signed)
Addended by: Mervyn Skeeters on: 02/05/2017 10:10 AM   Modules accepted: Orders

## 2017-02-05 NOTE — Progress Notes (Signed)
Office Visit Note   Patient: Kendra Gonzalez           Date of Birth: 04/07/1971           MRN: [id number] Visit Date: 02/05/2017              Requested by: Mancel Bale, PA-C [address] [place], [place] [address] PCP: Mancel Bale, PA-C   Assessment & Plan: Visit Diagnoses:  1. Pain in left hip     Plan: Recurrent pain lateral aspect left hip which could be recurrent bursitis or an issue with the IT band. I think it's less likely that its related to the osteoarthritis of her left hip which appears to be relatively minor on plain film. Long discussion regarding different diagnostic possibilities. We decided to obtain an MRI scan related to the recurrent nature the pain after approximately 6 weeks. Tramadol for pain  Return after MRI left hip.   Orders:  No orders of the defined types were placed in this encounter.  Meds ordered this encounter  Medications  . traMADol (ULTRAM) 50 MG tablet    Sig: Take 1-2 tablets po bid prn.    Dispense:  30 tablet    Refill:  0      Procedures: No procedures performed   Clinical Data: No additional findings.   Subjective: Chief Complaint  Patient presents with  . Left Hip - Pain    Kendra Gonzalez is a 46 y o that presents with chronic L hip pain.   Recurrent history of lateral left hip pain. Had at least 6 weeks relief of the injection over the greater trochanter in August. Has had recurrent pain without injury or trauma. X-rays demonstrated some mild degenerative change at the left hip joint but really not having much pain along the groin or anterior thigh. Pain is localized laterally. No back pain. Not having any numbness or tingling. Prior surgery in 2012 for resection of the greater trochanteric bursitis with excellent relief for a number of years. He also has some ectopic calcification along the greater trochanter which could be postop changes  HPI  Review of Systems  Constitutional: Negative for chills, fatigue and  fever.  Eyes: Negative for itching.  Respiratory: Negative for chest tightness and shortness of breath.   Cardiovascular: Negative for chest pain, palpitations and leg swelling.  Gastrointestinal: Negative for blood in stool, constipation and diarrhea.  Endocrine: Negative for polyuria.  Genitourinary: Negative for dysuria.  Musculoskeletal: Negative for back pain, joint swelling, neck pain and neck stiffness.  Allergic/Immunologic: Negative for immunocompromised state.  Neurological: Negative for dizziness and numbness.  Hematological: Does not bruise/bleed easily.  Psychiatric/Behavioral: The patient is not nervous/anxious.      Objective: Vital Signs: BP 125/78   Pulse 92   Resp 14   Ht 5\' 4"  (1.626 m)   Wt 175 lb (79.4 kg)   LMP 06/06/1996   BMI 30.04 kg/m   Physical Exam  Ortho Exam awake alert and oriented 3 comfortable sitting. Does walk with a limp referable to the left hip. Local tenderness over the greater trochanteric region left hip. No groin pain. No pain with range of motion of her left hip in the groin or thigh does have some laterally. Straight leg raise negative. No thigh discomfort to palpation. No calf pain. Skin intact. Neurovascular exam intact. No percussible tenderness of lumbar spine.  Specialty Comments:  No specialty comments available.  Imaging: No results found.   PMFS History:  Patient Active Problem List   Diagnosis Date Noted  . Hyperthyroidism 11/03/2016  . Multinodular goiter 04/17/2015  . History of stomach ulcers 03/22/2015  . Low TSH level 03/22/2015  . Primary localized osteoarthrosis, pelvic region and thigh 05/02/2014  . Primary osteoarthritis of left hip 05/02/2014  . Chronic pain of left heel 04/23/2014  . Chronic pain syndrome 03/19/2014  . Asthma 01/04/2013  . Abdominal pain, chronic, right upper quadrant 06/23/2012  . Obesity (BMI 30.0-34.9) 10/12/2011  . Enthesopathy of hip region 01/22/2011  . Paroxysmal nerve pain  01/22/2011  . Radiculopathy 01/22/2011  . Trochanteric bursitis of left hip 01/22/2011  . HYPERLIPIDEMIA 02/16/2007  . ALLERGIC RHINITIS 02/16/2007  . GERD 02/16/2007   Past Medical History:  Diagnosis Date  . Allergy   . Arthritis   . Asthma   . Endometriosis   . GERD (gastroesophageal reflux disease)   . History of tetanus, diphtheria, and acellular pertussis booster vaccination (Tdap)    07/30/2010  . Hyperlipidemia   . Kidney stones   . Pancreatitis     Family History  Problem Relation Age of Onset  . Heart disease Daughter   . Hypertension Father   . Hypertension Sister   . Hypertension Brother   . Hypertension Maternal Grandfather   . Heart disease Maternal Grandfather   . Hypertension Paternal Grandfather   . Hypertension Brother   . Hypertension Sister   . Thyroid disease Neg Hx     Past Surgical History:  Procedure Laterality Date  . ABDOMINAL HYSTERECTOMY    . arthroscopic knee surgery     . carpel tunnel    . CHOLECYSTECTOMY    . ERCP  01/08/2012   Procedure: ENDOSCOPIC RETROGRADE CHOLANGIOPANCREATOGRAPHY (ERCP);  Surgeon: Beryle Beams, MD;  Location: [facility] ENDOSCOPY;  Service: Endoscopy;  Laterality: N/A;  . HIP SURGERY    . oophrectomy  2011   secondary to cysts  . 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 Smoking status: Never Smoker  . Smokeless tobacco: Never Used  . Alcohol use No  . Drug use: No  . Sexual activity: Yes    Birth control/ protection: None

## 2017-02-22 ENCOUNTER — Ambulatory Visit
Admission: RE | Admit: 2017-02-22 | Discharge: 2017-02-22 | Disposition: A | Discharge status: Home / Self Care | Payer: Commercial Managed Care - HMO | Source: Ambulatory Visit | Attending: Orthopaedic Surgery | Admitting: Orthopaedic Surgery

## 2017-02-22 DIAGNOSIS — M25552 Pain in left hip: Secondary | ICD-10-CM

## 2017-02-22 DIAGNOSIS — M1612 Unilateral primary osteoarthritis, left hip: Secondary | ICD-10-CM | POA: Diagnosis not present

## 2017-02-25 ENCOUNTER — Encounter (INDEPENDENT_AMBULATORY_CARE_PROVIDER_SITE_OTHER): Payer: Self-pay | Admitting: Orthopaedic Surgery

## 2017-02-25 ENCOUNTER — Ambulatory Visit (INDEPENDENT_AMBULATORY_CARE_PROVIDER_SITE_OTHER): Payer: Commercial Managed Care - HMO | Admitting: Orthopaedic Surgery

## 2017-02-25 ENCOUNTER — Other Ambulatory Visit (INDEPENDENT_AMBULATORY_CARE_PROVIDER_SITE_OTHER): Payer: Self-pay | Admitting: Orthopedic Surgery

## 2017-02-25 VITALS — BP 140/80 | HR 104 | Resp 12 | Ht 64.5 in | Wt 175.0 lb

## 2017-02-25 DIAGNOSIS — M25552 Pain in left hip: Secondary | ICD-10-CM

## 2017-02-25 NOTE — Progress Notes (Signed)
Office Visit Note   Patient: Kendra Gonzalez           Date of Birth: 1970-07-25           MRN: [id number] Visit Date: 02/25/2017              Requested by: Mancel Bale, PA-C [address] [place], [place] [address] PCP: Mancel Bale, PA-C   Assessment & Plan: Visit Diagnoses:  1. Pain of left hip joint     Plan: Consult Dr. Ernestina Patches to consider injecting the left hip. Initially try Xylocaine to see if it makes a difference and if so then injected cortisone. If it does not provide any symptomatic relief and consider injecting the area of the bursa and glutei insertions about the greater trochanter. Please refer to MRI scan findings. Handicap parking sticker for 4 months. Note for work. Office 3-4 weeks. See details below regarding history. Not sure the exact etiology of her pain i.e. whether  intra-articular or extra-articular  Follow-Up Instructions: Return in about 1 month (around 03/27/2017).   Orders:  No orders of the defined types were placed in this encounter.  No orders of the defined types were placed in this encounter.     Procedures: No procedures performed   Clinical Data: No additional findings.   Subjective: Chief Complaint  Patient presents with  . Left Hip - Pain    [name] is a 46 y o here today for MRI results of Left hip. She also would like to have a renewal of handicap placard.  Mrs. Kendra Gonzalez is had recurrent pain in her left hip as noted in her last office note. I've tried a cortisone injection over the greater trochanter in the area of the bursa and glutei insertions without much relief. Accordingly I ordered an MRI scan. MRI scan demonstrated slight irregularity of the anterior superior aspect of the left acetabulum probably at the site of previous labral repair the femoral head appeared to be normal. There is no joint space narrowing. The articular cartilage and labrum appears to be normal. There was no bursal effusion or joint effusion. No  evidence of bursitis. There was slight atrophy and calcification of the distal left gluteus minimus and medius muscles and tendons probably from the old bursa excision in 2012. Mrs Kendra Gonzalez had a prior exploration and debridement of the bursa in 2012. Dr. Aretha Parrot performed a left hip arthroscopy with labral repair in September 2017.  HPI  Review of Systems  Constitutional: Negative for chills, fatigue and fever.  Eyes: Negative for itching.  Respiratory: Negative for chest tightness and shortness of breath.   Cardiovascular: Negative for chest pain, palpitations and leg swelling.  Gastrointestinal: Negative for blood in stool, constipation and diarrhea.  Endocrine: Negative for polyuria.  Genitourinary: Negative for dysuria.  Musculoskeletal: Negative for back pain, joint swelling, neck pain and neck stiffness.  Allergic/Immunologic: Negative for immunocompromised state.  Neurological: Negative for dizziness and numbness.  Hematological: Does not bruise/bleed easily.  Psychiatric/Behavioral: Positive for sleep disturbance. The patient is not nervous/anxious.      Objective: Vital Signs: BP 140/80   Pulse (!) 104   Resp 12   Ht 5' 4.5" (1.638 m)   Wt 175 lb (79.4 kg)   LMP 06/06/1996   BMI 29.57 kg/m   Physical Exam  Ortho Exam awake alert and oriented 3. Comfortable sitting some pain with internal/external rotation of her left hip. Some tenderness over the greater trochanter. No induration. No ecchymosis.  No edema distally. Straight leg raise negative. No back  Pain and does walk with a limp  Specialty Comments:  No specialty comments available.  Imaging: No results found.   PMFS History: Patient Active Problem List   Diagnosis Date Noted  . Hyperthyroidism 11/03/2016  . Multinodular goiter 04/17/2015  . History of stomach ulcers 03/22/2015  . Low TSH level 03/22/2015  . Primary localized osteoarthrosis, pelvic region and thigh 05/02/2014  . Primary osteoarthritis of  left hip 05/02/2014  . Chronic pain of left heel 04/23/2014  . Chronic pain syndrome 03/19/2014  . Asthma 01/04/2013  . Abdominal pain, chronic, right upper quadrant 06/23/2012  . Obesity (BMI 30.0-34.9) 10/12/2011  . Enthesopathy of hip region 01/22/2011  . 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 Radiculopathy 01/22/2011  . Trochanteric bursitis of left hip 01/22/2011  . HYPERLIPIDEMIA 02/16/2007  . ALLERGIC RHINITIS 02/16/2007  . GERD 02/16/2007   Past Medical History:  Diagnosis Date  . Allergy   . Arthritis   . Asthma   . Endometriosis   . GERD (gastroesophageal reflux disease)   . History of tetanus, diphtheria, and acellular pertussis booster vaccination (Tdap)    07/30/2010  . Hyperlipidemia   . Kidney stones   . Pancreatitis     Family History  Problem Relation Age of Onset  . Heart disease Daughter   . Hypertension Father   . Hypertension Sister   . Hypertension Brother   . Hypertension Maternal Grandfather   . Heart disease Maternal Grandfather   . Hypertension Paternal Grandfather   . Hypertension Brother   . Hypertension Sister   . Thyroid disease Neg Hx     Past Surgical History:  Procedure Laterality Date  . ABDOMINAL HYSTERECTOMY    . arthroscopic knee surgery     . carpel tunnel    . CHOLECYSTECTOMY    . ERCP  01/08/2012   Procedure: ENDOSCOPIC RETROGRADE CHOLANGIOPANCREATOGRAPHY (ERCP);  Surgeon: Beryle Beams, MD;  Location: [facility] ENDOSCOPY;  Service: Endoscopy;  Laterality: N/A;  . HIP SURGERY    . oophrectomy  2011   secondary to cysts  . 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 Smoking status: Never Smoker  . Smokeless tobacco: Never Used  . Alcohol use No  . Drug use: No  . Sexual activity: Yes    Birth control/ protection: None

## 2017-03-09 DIAGNOSIS — Z1231 Encounter for screening mammogram for malignant neoplasm of breast: Secondary | ICD-10-CM | POA: Diagnosis not present

## 2017-03-16 DIAGNOSIS — Z4789 Encounter for other orthopedic aftercare: Secondary | ICD-10-CM | POA: Diagnosis not present

## 2017-03-16 DIAGNOSIS — M160 Bilateral primary osteoarthritis of hip: Secondary | ICD-10-CM | POA: Diagnosis not present

## 2017-03-16 DIAGNOSIS — Z9889 Other specified postprocedural states: Secondary | ICD-10-CM | POA: Diagnosis not present

## 2017-03-16 DIAGNOSIS — M25552 Pain in left hip: Secondary | ICD-10-CM | POA: Diagnosis not present

## 2017-03-22 ENCOUNTER — Ambulatory Visit (INDEPENDENT_AMBULATORY_CARE_PROVIDER_SITE_OTHER): Payer: Commercial Managed Care - HMO

## 2017-03-22 ENCOUNTER — Encounter (INDEPENDENT_AMBULATORY_CARE_PROVIDER_SITE_OTHER): Payer: Self-pay | Admitting: Physical Medicine and Rehabilitation

## 2017-03-22 ENCOUNTER — Ambulatory Visit (INDEPENDENT_AMBULATORY_CARE_PROVIDER_SITE_OTHER): Payer: Commercial Managed Care - HMO | Admitting: Physical Medicine and Rehabilitation

## 2017-03-22 DIAGNOSIS — M25552 Pain in left hip: Secondary | ICD-10-CM

## 2017-03-22 NOTE — Patient Instructions (Signed)

## 2017-03-22 NOTE — Progress Notes (Deleted)
Here for Left hip injection, she is having left hip pain, + Driver, - Dye allergy

## 2017-03-22 NOTE — Progress Notes (Signed)
Vivion M Cassidy - 46 y.o. female MRN [id number]  Date of birth: 1970-12-02  Office Visit Note: Visit Date: 03/22/2017 PCP: Mancel Bale, PA-C Referred by: Gale Journey Damaris Hippo, PA-C  Subjective: Chief Complaint  Patient presents with  . Left Hip - Pain   HPI: Mrs. Ulrey is a 46 year old female that we have seen in the past through Dr. Durward Fortes.  She has a history of chronic left hip pain. She has had a prior exploration and debridement of the bursa in 2012. Dr. Aretha Parrot performed a left hip arthroscopy with labral repair in September 2017.  She continues to see Dr. Aretha Parrot at [facility] and has recently seen him last week.  Dr. Durward Fortes requested intra-articular injection of the left hip with lidocaine as a diagnostic test and if this helped place cortisone as well.  His note also reads that if it did not help look at possible fluoroscopic guided bursa injection.  Unfortunately just the way we do the injection it would be very hard to project just with lidocaine instilled a cortisone later and less it was a separate procedure.  I discussed with the patient today and I think it is much simpler to provide the intra-articular injection with the cortisone as well as Marcaine and lidocaine.  She reports her symptoms are fairly constant and she has certain movements that will reproduce her pain.  We should be able to see diagnostically once were finished if it helped if it did we will already have cortisone in place.  If it does not help we could bring her back for trochanteric injection.    ROS Otherwise per HPI.  Assessment & Plan: Visit Diagnoses:  1. Pain in left hip     Plan: Findings:  Diagnostic and hopefully therapeutic anesthetic hip arthrogram was performed.  She actually did have a fair amount of relief of her symptoms during the anesthetic phase of the intra-articular injection.  She is going to monitor this today and get an idea of what has helped and what has not.  She will continue  to follow-up with Dr. Durward Fortes and Dr. Aretha Parrot.    Meds & Orders: No orders of the defined types were placed in this encounter.   Orders Placed This Encounter  Procedures  . Large Joint Inj: L hip joint  . XR C-ARM NO REPORT    Follow-up: Return if symptoms worsen or fail to improve.   Procedures: Large Joint Inj: L hip joint on 03/22/2017 10:26 AM Indications: pain and diagnostic evaluation Details: 22 G needle, anterior approach  Arthrogram: Yes  Medications: 3 mL bupivacaine 0.5 %; 80 mg triamcinolone acetonide 40 MG/ML Outcome: tolerated well, no immediate complications  Arthrogram demonstrated excellent flow of contrast throughout the joint surface without extravasation or obvious defect.  The patient had relief of symptoms during the anesthetic phase of the injection.  Procedure, treatment alternatives, risks and benefits explained, specific risks discussed. Consent was given by the patient. Immediately prior to procedure a time out was called to verify the correct patient, procedure, equipment, support staff and site/side marked as required. Patient was prepped and draped in the usual sterile fashion.      No notes on file   Clinical History: No specialty comments available.  She reports that  has never smoked. she has never used smokeless tobacco. No results for input(s): HGBA1C, LABURIC in the last 8760 hours.  Objective:  VS:  HT:    WT:   BMI:  BP:   HR: bpm  TEMP: ( )  RESP:  Physical Exam  Musculoskeletal:  Patient does have tenderness over the greater trochanter and even superior.  Have some painful range of motion and ranges of the left hip.    Ortho Exam Imaging: Xr C-arm No Report  Result Date: 03/22/2017 Please see Notes or Procedures tab for imaging impression.   Past Medical/Family/Surgical/Social History: Medications & Allergies reviewed per EMR Patient Active Problem List   Diagnosis Date Noted  . Hyperthyroidism 11/03/2016  .  Multinodular goiter 04/17/2015  . History of stomach ulcers 03/22/2015  . Low TSH level 03/22/2015  . Primary localized osteoarthrosis, pelvic region and thigh 05/02/2014  . Primary osteoarthritis of left hip 05/02/2014  . Chronic pain of left heel 04/23/2014  . Chronic pain syndrome 03/19/2014  . Asthma 01/04/2013  . Abdominal pain, chronic, right upper quadrant 06/23/2012  . Obesity (BMI 30.0-34.9) 10/12/2011  . Enthesopathy of hip region 01/22/2011  . 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 Radiculopathy 01/22/2011  . Trochanteric bursitis of left hip 01/22/2011  . HYPERLIPIDEMIA 02/16/2007  . ALLERGIC RHINITIS 02/16/2007  . GERD 02/16/2007   Past Medical History:  Diagnosis Date  . Allergy   . Arthritis   . Asthma   . Endometriosis   . GERD (gastroesophageal reflux disease)   . History of tetanus, diphtheria, and acellular pertussis booster vaccination (Tdap)    07/30/2010  . Hyperlipidemia   . Kidney stones   . Pancreatitis    Family History  Problem Relation Age of Onset  . Heart disease Daughter   . Hypertension Father   . Hypertension Sister   . Hypertension Brother   . Hypertension Maternal Grandfather   . Heart disease Maternal Grandfather   . Hypertension Paternal Grandfather   . Hypertension Brother   . Hypertension Sister   . Thyroid disease Neg Hx    Past Surgical History:  Procedure Laterality Date  . ABDOMINAL HYSTERECTOMY    . arthroscopic knee surgery     . carpel tunnel    . CHOLECYSTECTOMY    . ERCP  01/08/2012   Procedure: ENDOSCOPIC RETROGRADE CHOLANGIOPANCREATOGRAPHY (ERCP);  Surgeon: Beryle Beams, MD;  Location: [facility] ENDOSCOPY;  Service: Endoscopy;  Laterality: N/A;  . HIP SURGERY    . oophrectomy  2011   secondary to cysts  . TUBAL LIGATION     Social History   Occupational History  . Occupation: Nursing Aid    Employer: [employer]  Tobacco Use  . Smoking status: Never Smoker  . Smokeless tobacco: Never Used  Substance and Sexual  Activity  . Alcohol use: No  . Drug use: No  . Sexual activity: Yes    Birth control/protection: None

## 2017-03-23 MED ORDER — BUPIVACAINE HCL 0.5 % IJ SOLN
3.0000 mL | INTRAMUSCULAR | Status: AC | PRN
  Administered 2017-03-22: 3 mL via INTRA_ARTICULAR

## 2017-03-23 MED ORDER — TRIAMCINOLONE ACETONIDE 40 MG/ML IJ SUSP
80.0000 mg | INTRAMUSCULAR | Status: AC | PRN
  Administered 2017-03-22: 80 mg via INTRA_ARTICULAR

## 2017-03-29 ENCOUNTER — Ambulatory Visit (INDEPENDENT_AMBULATORY_CARE_PROVIDER_SITE_OTHER): Payer: Commercial Managed Care - HMO | Admitting: Orthopaedic Surgery

## 2017-03-29 ENCOUNTER — Other Ambulatory Visit (INDEPENDENT_AMBULATORY_CARE_PROVIDER_SITE_OTHER): Payer: Self-pay

## 2017-03-29 ENCOUNTER — Ambulatory Visit (INDEPENDENT_AMBULATORY_CARE_PROVIDER_SITE_OTHER): Payer: Self-pay

## 2017-03-29 ENCOUNTER — Encounter (INDEPENDENT_AMBULATORY_CARE_PROVIDER_SITE_OTHER): Payer: Self-pay | Admitting: Orthopaedic Surgery

## 2017-03-29 VITALS — BP 142/79 | HR 90 | Resp 16 | Ht 68.0 in | Wt 165.0 lb

## 2017-03-29 DIAGNOSIS — M25552 Pain in left hip: Secondary | ICD-10-CM

## 2017-03-29 DIAGNOSIS — G8929 Other chronic pain: Secondary | ICD-10-CM

## 2017-03-29 DIAGNOSIS — M5442 Lumbago with sciatica, left side: Secondary | ICD-10-CM | POA: Diagnosis not present

## 2017-03-29 NOTE — Progress Notes (Signed)
Office Visit Note   Patient: Kendra Gonzalez           Date of Birth: 1970-09-18           MRN: [id number] Visit Date: 03/29/2017              Requested by: Mancel Bale, PA-C [address] [place], [place] [address] PCP: Mancel Bale, PA-C   Assessment & Plan: Visit Diagnoses:  1. Chronic left-sided low back pain with left-sided sciatica     Plan: Intra-articular cortisone injection just over weeks ago provided some temporary relief of pain. We will order MRI scan of lumbar spine to be sure there is no issue related to her back if she is experiencing pain as far distally as her left foot. Also consider having Dr. Ernestina Patches inject the area of the greater trochanter. Also consider having Dr. Aretha Parrot reevaluate  Follow-Up Instructions: Return after MRI L-S spine.   Orders:  Orders Placed This Encounter  Procedures  . XR Lumbar Spine 2-3 Views   No orders of the defined types were placed in this encounter.     Procedures: No procedures performed   Clinical Data: No additional findings.   Subjective: Chief Complaint  Patient presents with  . Left Hip - Pain    Ms. Warmuth is a 46 y o hx of left labral tear and still has a shooting pain in her buttocks. SHe has made an appt with Dr. Ruthe Mannan (surgeon)  Minimal relief from Dr. Ernestina Patches injection  Several years status post arthroscopic labral repair of her left hip by Dr. Aretha Parrot at [facility]. That alleviated her groin and anterior thigh pain. Present problem is located along the lateral aspect of her left hip. MRI scan did not demonstrate any intra-articular pathology but there were some degenerative changes of the glutei muscles. Dr. Ernestina Patches injected her left hip about a week ago with initial relief of her pain. But was only "short-lived". She is having pain along the lateral aspect of the left hip that radiates as far distally as her left foot. Not having any specific back pain. No recent injury or trauma. No difficulty  ambulating  HPI  Review of Systems  Constitutional: Negative for chills, fatigue and fever.  Eyes: Negative for itching.  Respiratory: Negative for chest tightness and shortness of breath.   Cardiovascular: Negative for chest pain, palpitations and leg swelling.  Gastrointestinal: Negative for blood in stool, constipation and diarrhea.  Endocrine: Negative for polyuria.  Genitourinary: Negative for dysuria.  Musculoskeletal: Negative for back pain, joint swelling, neck pain and neck stiffness.  Allergic/Immunologic: Negative for immunocompromised state.  Neurological: Negative for dizziness and numbness.  Hematological: Does not bruise/bleed easily.  Psychiatric/Behavioral: Positive for sleep disturbance. The patient is not nervous/anxious.      Objective: Vital Signs: BP (!) 142/79   Pulse 90   Resp 16   Ht 5\' 8"  (1.727 m)   Wt 165 lb (74.8 kg)   LMP 06/06/1996   BMI 25.09 kg/m   Physical Exam  Ortho Exam awake alert and oriented 3. Comfortable sitting. Painless range of motion with internal/external rotation of her left hip. Some mild tenderness over the area of the greater trochanter left hip. Straight leg raise negative. No percussible tenderness of the lumbar spine. Neurovascular exam is intact.  Specialty Comments:  No specialty comments available.  Imaging: Xr Lumbar Spine 2-3 Views  Result Date: 03/29/2017 AP lateral lumbar spine demonstrate some mild decrease in the L5-S1 disc  space. No listhesis. Mild facet joint changes at L4-5 and L5-S1.    PMFS History: Patient Active Problem List   Diagnosis Date Noted  . Hyperthyroidism 11/03/2016  . Multinodular goiter 04/17/2015  . History of stomach ulcers 03/22/2015  . Low TSH level 03/22/2015  . Primary localized osteoarthrosis, pelvic region and thigh 05/02/2014  . Primary osteoarthritis of left hip 05/02/2014  . Chronic pain of left heel 04/23/2014  . Chronic pain syndrome 03/19/2014  . Asthma 01/04/2013   . Abdominal pain, chronic, right upper quadrant 06/23/2012  . Obesity (BMI 30.0-34.9) 10/12/2011  . Enthesopathy of hip region 01/22/2011  . 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 Radiculopathy 01/22/2011  . Trochanteric bursitis of left hip 01/22/2011  . HYPERLIPIDEMIA 02/16/2007  . ALLERGIC RHINITIS 02/16/2007  . GERD 02/16/2007   Past Medical History:  Diagnosis Date  . Allergy   . Arthritis   . Asthma   . Endometriosis   . GERD (gastroesophageal reflux disease)   . History of tetanus, diphtheria, and acellular pertussis booster vaccination (Tdap)    07/30/2010  . Hyperlipidemia   . Kidney stones   . Pancreatitis     Family History  Problem Relation Age of Onset  . Heart disease Daughter   . Hypertension Father   . Hypertension Sister   . Hypertension Brother   . Hypertension Maternal Grandfather   . Heart disease Maternal Grandfather   . Hypertension Paternal Grandfather   . Hypertension Brother   . Hypertension Sister   . Thyroid disease Neg Hx     Past Surgical History:  Procedure Laterality Date  . ABDOMINAL HYSTERECTOMY    . arthroscopic knee surgery     . carpel tunnel    . CHOLECYSTECTOMY    . ERCP  01/08/2012   Procedure: ENDOSCOPIC RETROGRADE CHOLANGIOPANCREATOGRAPHY (ERCP);  Surgeon: Beryle Beams, MD;  Location: [facility] ENDOSCOPY;  Service: Endoscopy;  Laterality: N/A;  . HIP SURGERY    . oophrectomy  2011   secondary to cysts  . TUBAL LIGATION     Social History   Occupational History  . Occupation: Nursing Aid    Employer: [employer]  Tobacco Use  . Smoking status: Never Smoker  . Smokeless tobacco: Never Used  Substance and Sexual Activity  . Alcohol use: No  . Drug use: No  . Sexual activity: Yes    Birth control/protection: None

## 2017-04-02 ENCOUNTER — Ambulatory Visit (INDEPENDENT_AMBULATORY_CARE_PROVIDER_SITE_OTHER): Payer: Commercial Managed Care - HMO | Admitting: Physician Assistant

## 2017-04-02 ENCOUNTER — Other Ambulatory Visit: Payer: Self-pay

## 2017-04-02 ENCOUNTER — Encounter: Payer: Self-pay | Admitting: Physician Assistant

## 2017-04-02 VITALS — BP 118/74 | HR 67 | Temp 97.7°F | Resp 18 | Ht 68.0 in | Wt 180.8 lb

## 2017-04-02 DIAGNOSIS — Z1231 Encounter for screening mammogram for malignant neoplasm of breast: Secondary | ICD-10-CM | POA: Diagnosis not present

## 2017-04-02 DIAGNOSIS — Z23 Encounter for immunization: Secondary | ICD-10-CM

## 2017-04-02 DIAGNOSIS — N760 Acute vaginitis: Secondary | ICD-10-CM

## 2017-04-02 DIAGNOSIS — Z1389 Encounter for screening for other disorder: Secondary | ICD-10-CM | POA: Diagnosis not present

## 2017-04-02 DIAGNOSIS — Z114 Encounter for screening for human immunodeficiency virus [HIV]: Secondary | ICD-10-CM | POA: Diagnosis not present

## 2017-04-02 DIAGNOSIS — E785 Hyperlipidemia, unspecified: Secondary | ICD-10-CM

## 2017-04-02 DIAGNOSIS — E0590 Thyrotoxicosis, unspecified without thyrotoxic crisis or storm: Secondary | ICD-10-CM | POA: Diagnosis not present

## 2017-04-02 DIAGNOSIS — J4530 Mild persistent asthma, uncomplicated: Secondary | ICD-10-CM

## 2017-04-02 DIAGNOSIS — Z0000 Encounter for general adult medical examination without abnormal findings: Secondary | ICD-10-CM | POA: Diagnosis not present

## 2017-04-02 DIAGNOSIS — B9689 Other specified bacterial agents as the cause of diseases classified elsewhere: Secondary | ICD-10-CM | POA: Diagnosis not present

## 2017-04-02 DIAGNOSIS — Z8719 Personal history of other diseases of the digestive system: Secondary | ICD-10-CM

## 2017-04-02 DIAGNOSIS — E042 Nontoxic multinodular goiter: Secondary | ICD-10-CM

## 2017-04-02 DIAGNOSIS — Z8711 Personal history of peptic ulcer disease: Secondary | ICD-10-CM

## 2017-04-02 MED ORDER — FLUTICASONE-SALMETEROL 115-21 MCG/ACT IN AERO: 2.0000 | INHALATION_SPRAY | Freq: Two times a day (BID) | RESPIRATORY_TRACT | 11 refills | Status: DC

## 2017-04-02 MED ORDER — PANTOPRAZOLE SODIUM 40 MG PO TBEC: 40.0000 mg | DELAYED_RELEASE_TABLET | Freq: Every day | ORAL | 5 refills | Status: DC

## 2017-04-02 MED ORDER — ALBUTEROL SULFATE HFA 108 (90 BASE) MCG/ACT IN AERS: 2.0000 | INHALATION_SPRAY | Freq: Four times a day (QID) | RESPIRATORY_TRACT | 6 refills | Status: DC | PRN

## 2017-04-02 MED ORDER — BORIC ACID POWD: 600.0000 mg | 0 refills | Status: DC

## 2017-04-02 NOTE — Progress Notes (Signed)
Chief Complaint  Patient presents with  . Annual Exam    No pap needed.  . Medication Refill    Protonix 40 MG, Boric Acid, Albuterol Sulfate and Advair HFA   Subjective:    Patient ID: Kendra Gonzalez, female    DOB: Dec 17, 1970, 46 y.o.   MRN: [id number]  HPI  Kendra Gonzalez is a 46 year old African American female with a past medical history significant for hyperlipidemia and hyperthyroidsm who presents for an annual exam and medication refill.  Patient has been well since her last visit. No health concerns.   She eats 2-3 times a day. She has been trying to eat healthy and has cut out a lot of fried foods.  She exercises five days a week in 45-60 minutes sessions. She usually runs and is also in bootcamp.  She sees Dr. Truman Hayward for eye exams once per year. Last eye exam was in August. She has been having some intermittent blurry vision and thinks it might be due to her eyes being a [place] dry. She plans to schedule an appointment soon. She sees Dr. Emmie Niemann for dental exams every 6 months.  Review of Systems  Constitutional: Negative for appetite change, fatigue, fever and unexpected weight change.  Eyes: Positive for visual disturbance (intermittent blurry vision for three months).  Respiratory: Negative for cough and shortness of breath.   Cardiovascular: Negative for chest pain.  Gastrointestinal: Negative for abdominal pain, constipation, diarrhea, nausea and vomiting.  Genitourinary: Negative for dysuria, frequency and urgency.  Musculoskeletal: Positive for arthralgias (knees and hips. ).  Neurological: Negative for light-headedness and headaches.   Patient Active Problem List   Diagnosis Date Noted  . Hyperthyroidism 11/03/2016  . Multinodular goiter 04/17/2015  . History of stomach ulcers 03/22/2015  . Primary localized osteoarthrosis, pelvic region and thigh 05/02/2014  . Primary osteoarthritis of left hip 05/02/2014  . Chronic pain of left heel 04/23/2014  . Chronic pain  syndrome 03/19/2014  . Asthma 01/04/2013  . Abdominal pain, chronic, right upper quadrant 06/23/2012  . Obesity (BMI 30.0-34.9) 10/12/2011  . Enthesopathy of hip region 01/22/2011  . 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 Radiculopathy 01/22/2011  . Trochanteric bursitis of left hip 01/22/2011  . Hyperlipidemia 02/16/2007  . ALLERGIC RHINITIS 02/16/2007  . GERD 02/16/2007      Past Medical History:  Diagnosis Date  . Allergy   . Arthritis   . Asthma   . Endometriosis   . GERD (gastroesophageal reflux disease)   . History of tetanus, diphtheria, and acellular pertussis booster vaccination (Tdap)    07/30/2010  . Hyperlipidemia   . Kidney stones   . Pancreatitis    Current Outpatient Medications on File Prior to Visit  Medication Sig Dispense Refill  . acetaminophen (TYLENOL) 500 MG tablet Take 1,000 mg by mouth every 6 (six) hours as needed for mild pain or headache.    . glucosamine-chondroitin 500-400 MG tablet Take 1 tablet by mouth 3 (three) times daily as needed (joint pain).     Marland Kitchen ipratropium (ATROVENT) 0.03 % nasal spray Place 2 sprays into the nose 4 (four) times daily. 30 mL 0  . levocetirizine (XYZAL) 5 MG tablet Take 1 tablet (5 mg total) by mouth every evening. 30 tablet 6  . magnesium gluconate (MAGONATE) 500 MG tablet Take 500 mg by mouth at bedtime.    . methimazole (TAPAZOLE) 5 MG tablet Take 1 tablet (5 mg total) by mouth daily. 30 tablet 3  . Multiple Vitamins-Minerals (  MULTIVITAMIN WITH MINERALS) tablet Take 1 tablet by mouth daily.    . traMADol (ULTRAM) 50 MG tablet Take by mouth every 6 (six) hours as needed.    . traMADol (ULTRAM) 50 MG tablet Take 1-2 tablets po bid prn. 30 tablet 0  . VOLTAREN 1 % GEL     . XIIDRA 5 % SOLN      No current facility-administered medications on file prior to visit.    No Known Allergies  Past Surgical History:  Procedure Laterality Date  . ABDOMINAL HYSTERECTOMY    . arthroscopic knee surgery     . carpel tunnel    .  CHOLECYSTECTOMY    . ERCP  01/08/2012   Procedure: ENDOSCOPIC RETROGRADE CHOLANGIOPANCREATOGRAPHY (ERCP);  Surgeon: Beryle Beams, MD;  Location: [facility] ENDOSCOPY;  Service: Endoscopy;  Laterality: N/A;  . HIP SURGERY    . oophrectomy  2011   secondary to cysts  . TUBAL LIGATION     Social History   Socioeconomic History  . Marital status: Married    Spouse name: Fritz Pickerel  . Number of children: 2  . Years of education: Not on file  . Highest education level: Not on file  Social Needs  . Financial resource strain: Not on file  . Food insecurity - worry: Not on file  . Food insecurity - inability: Not on file  . Transportation needs - medical: Not on file  . Transportation needs - non-medical: Not on file  Occupational History  . Occupation: Nursing Aid    Employer: [employer]  Tobacco Use  . Smoking status: Never Smoker  . Smokeless tobacco: Never Used  Substance and Sexual Activity  . Alcohol use: No  . Drug use: No  . Sexual activity: Yes    Birth control/protection: None  Other Topics Concern  . Not on file  Social History Narrative   Works as a Education officer, environmental.     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   Adult children live in [place], [place] and [place].   Objective:   Physical Exam  Constitutional: She appears well-developed and well-nourished. No distress.  HENT:  Head: Normocephalic and atraumatic.  Right Ear: External ear normal.  Left Ear: External ear normal.  Nose: Nose normal.  Mouth/Throat: Oropharynx is clear and moist.  Eyes: Conjunctivae are normal. Pupils are equal, round, and reactive to light.  Neck: No thyromegaly present.  Cardiovascular: Normal rate, regular rhythm, normal heart sounds and intact distal pulses.  Pulmonary/Chest: Effort normal and breath sounds normal.  Abdominal: Soft. Bowel sounds are normal. There is no tenderness.  Lymphadenopathy:    She has no cervical adenopathy.  Neurological: She is alert.  Skin: Skin is warm.  Psychiatric: She  has a normal mood and affect.      Assessment & Plan:  1. Annual physical exam Age appropriate preventative care information provided.  2. Need for influenza vaccination Administered in clinic today - Flu Vaccine QUAD 36+ mos IM  3. Screening for HIV (human immunodeficiency virus) Blood draw draw done in clinic today - HIV antibody  4. Encounter for screening mammogram for breast cancer Breast exam done in clinica today. No abnormal findings.  5. Hyperthyroidism Continue current medication regimen - Ordered CBC with Differential/Platelet, Comprehensive metabolic panel, TSH -Follow up with Dr. Loanne Drilling as soon as possible  6. Multinodular goiter Continue to monitor - TSH  7. Hyperlipidemia, unspecified hyperlipidemia type Discussed lifestyle management. Continue eating a healthy diet and exercising. - Comprehensive metabolic panel -  Lipid panel  8. Screening for blood or protein in urine - Urinalysis, dipstick only  9. Mild persistent asthma without complication Refilled medication per patient request. - fluticasone-salmeterol (ADVAIR HFA) 115-21 MCG/ACT inhaler; Inhale 2 puffs into the lungs 2 (two) times daily.  Dispense: 1 Inhaler; Refill: 11 - albuterol (PROVENTIL HFA;VENTOLIN HFA) 108 (90 Base) MCG/ACT inhaler; Inhale 2 puffs into the lungs every 6 (six) hours as needed for wheezing or shortness of breath.  Dispense: 1 Inhaler; Refill: 6  10. Bacterial vaginosis Refilled medication per patient request. - Boric Acid POWD; Place 600 mg vaginally as directed. Place 600 mg capsule intravaginally once a week as needed for irritation: Boric acid suppository  Dispense: 113 g; Refill: 0  11. History of stomach ulcers Refilled medication per patient request. - pantoprazole (PROTONIX) 40 MG tablet; Take 1 tablet (40 mg total) by mouth daily.  Dispense: 30 tablet; Refill: 5  Follow up in 1 year for an annual physical exam.  [place], Student-PA

## 2017-04-02 NOTE — Assessment & Plan Note (Signed)
GERD symptoms are well controlled on PPI therapy.

## 2017-04-02 NOTE — Progress Notes (Signed)
Patient ID: Kendra Gonzalez, female    DOB: [birth year], 46 y.o.   MRN: [id number]  PCP: Mancel Bale, PA-C  Chief Complaint  Patient presents with  . Annual Exam    No pap needed.  . Medication Refill    Protonix 40 MG, Boric Acid, Albuterol Sulfate and Advair HFA    Subjective:   Presents for [employer] Exam.  She generally feels well, but notes some decline in her visual acuity. She has an eye specialist and needs to schedule. Otherwise, she is feeling well.  Cervical Cancer Screening: no longer a candidate, s/p hysterectomy. Breast Cancer Screening: 03/09/2017 Colorectal Cancer Screening: not yet a candidate Bone Density Testing: not yet a candidate HIV Screening: today STI Screening: very low risk Seasonal Influenza Vaccination: today Td/Tdap Vaccination: 07/30/2010 Pneumococcal Vaccination: not yet a candidate Zoster Vaccination: not yet a candidate Frequency of Dental evaluation: Q6 months Frequency of Eye evaluation: annually, last in 11/2016    Review of Systems  Constitutional: Negative.   HENT: Negative.   Eyes: Positive for visual disturbance. Negative for photophobia, pain, discharge, redness and itching.  Respiratory: Negative.   Cardiovascular: Negative.   Gastrointestinal: Negative.   Endocrine: Negative.   Genitourinary: Negative.   Musculoskeletal: Positive for arthralgias, joint swelling and myalgias. Negative for back pain, gait problem, neck pain and neck stiffness.  Skin: Negative.   Allergic/Immunologic: Negative.   Neurological: Negative.   Hematological: Negative.   Psychiatric/Behavioral: Negative.    Depression screen [facility] 2/9 04/02/2017 10/05/2016 05/21/2016 05/02/2016 10/19/2015  Decreased Interest 0 0 0 0 0  Down, Depressed, Hopeless 0 0 0 0 0  PHQ - 2 Score 0 0 0 0 0     Patient Active Problem List   Diagnosis Date Noted  . Hyperthyroidism 11/03/2016  . Multinodular goiter 04/17/2015  . History of stomach ulcers  03/22/2015  . Primary localized osteoarthrosis, pelvic region and thigh 05/02/2014  . Primary osteoarthritis of left hip 05/02/2014  . Chronic pain of left heel 04/23/2014  . Chronic pain syndrome 03/19/2014  . Asthma 01/04/2013  . Abdominal pain, chronic, right upper quadrant 06/23/2012  . Obesity (BMI 30.0-34.9) 10/12/2011  . Enthesopathy of hip region 01/22/2011  . 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 Radiculopathy 01/22/2011  . Trochanteric bursitis of left hip 01/22/2011  . Hyperlipidemia 02/16/2007  . ALLERGIC RHINITIS 02/16/2007  . GERD 02/16/2007     Prior to Admission medications   Medication Sig Start Date End Date Taking? Authorizing Provider  acetaminophen (TYLENOL) 500 MG tablet Take 1,000 mg by mouth every 6 (six) hours as needed for mild pain or headache.   Yes [provider]  albuterol (PROVENTIL HFA;VENTOLIN HFA) 108 (90 BASE) MCG/ACT inhaler Inhale 2 puffs into the lungs every 6 (six) hours as needed for wheezing or shortness of breath. 03/19/15  Yes Ezekiel Slocumb, PA-C  Boric Acid POWD Place 600 mg vaginally as directed. Place 600 mg capsule intravaginally once a week as needed for irritation: Boric acid suppository 05/21/16  Yes English, Stephanie D, PA  fluticasone-salmeterol (ADVAIR HFA) 115-21 MCG/ACT inhaler Inhale 2 puffs into the lungs 2 (two) times daily. Patient taking differently: Inhale 2 puffs into the lungs 2 (two) times daily as needed (COPD).  03/19/15  Yes Ezekiel Slocumb, PA-C  glucosamine-chondroitin 500-400 MG tablet Take 1 tablet by mouth 3 (three) times daily as needed (joint pain).    Yes [provider]  ipratropium (ATROVENT) 0.03 % nasal spray Place  2 sprays into the nose 4 (four) times daily. 10/19/15  Yes Shawnee Knapp, MD  levocetirizine (XYZAL) 5 MG tablet Take 1 tablet (5 mg total) by mouth every evening. 03/09/14  Yes Le, Thao P, DO  magnesium gluconate (MAGONATE) 500 MG tablet Take 500 mg by mouth at bedtime.   Yes [provider]  methimazole (TAPAZOLE) 5 MG tablet Take 1 tablet (5 mg total) by mouth daily. 11/03/16  Yes Renato Shin, MD  Multiple Vitamins-Minerals (MULTIVITAMIN WITH MINERALS) tablet Take 1 tablet by mouth daily.   Yes [provider]  pantoprazole (PROTONIX) 40 MG tablet Take 1 tablet (40 mg total) by mouth daily. Patient taking differently: Take 40 mg by mouth daily as needed (heartburn).  03/19/15  Yes Ezekiel Slocumb, PA-C  traMADol (ULTRAM) 50 MG tablet Take by mouth every 6 (six) hours as needed.   Yes [provider]  traMADol Veatrice Bourbon) 50 MG tablet Take 1-2 tablets po bid prn. 02/05/17  Yes Garald Balding, MD  VOLTAREN 1 % GEL  09/13/15  Yes [provider]  XIIDRA 5 % SOLN  11/14/16  Yes [provider]    Past Medical History:  Diagnosis Date  . Allergy   . Arthritis   . Asthma   . Endometriosis   . GERD (gastroesophageal reflux disease)   . History of tetanus, diphtheria, and acellular pertussis booster vaccination (Tdap)    07/30/2010  . Hyperlipidemia   . Kidney stones   . Pancreatitis     Family History  Problem Relation Age of Onset  . Heart disease Daughter   . Hypertension Father   . Hypertension Sister   . Hypertension Brother   . Hypertension Maternal Grandfather   . Heart disease Maternal Grandfather   . Hypertension Paternal Grandfather   . Hypertension Brother   . Hypertension Sister   . Thyroid disease Neg Hx     Past Surgical History:  Procedure Laterality Date  . ABDOMINAL HYSTERECTOMY    . arthroscopic knee surgery     . carpel tunnel    . CHOLECYSTECTOMY    . ERCP  01/08/2012   Procedure: ENDOSCOPIC RETROGRADE CHOLANGIOPANCREATOGRAPHY (ERCP);  Surgeon: Beryle Beams, MD;  Location: [facility] ENDOSCOPY;  Service: Endoscopy;  Laterality: N/A;  . HIP SURGERY    . oophrectomy  2011   secondary to cysts  . TUBAL LIGATION      Social History   Socioeconomic History  . Marital status: Married    Spouse name:  Fritz Pickerel  . Number of children: 2  . Years of education: Not on file  . Highest education level: Not on file  Social Needs  . Financial resource strain: Not on file  . Food insecurity - worry: Not on file  . Food insecurity - inability: Not on file  . Transportation needs - medical: Not on file  . Transportation needs - non-medical: Not on file  Occupational History  . Occupation: Nursing Aid    Employer: [employer]  Tobacco Use  . Smoking status: Never Smoker  . Smokeless tobacco: Never Used  Substance and Sexual Activity  . Alcohol use: No  . Drug use: No  . Sexual activity: Yes    Birth control/protection: None  Other Topics Concern  . Not on file  Social History Narrative   Works as a Education officer, environmental.     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   Adult children live in [place], [place] and [place].  No Known Allergies     Objective:  Physical Exam  Constitutional: She is oriented to person, place, and time. Vital signs are normal. She appears well-developed and well-nourished. She is active and cooperative. No distress.  BP 118/74 (BP Location: Left Arm, Patient Position: Sitting, Cuff Size: Normal)   Pulse 67   Temp 97.7 F (36.5 C) (Oral)   Resp 18   Ht 5\' 8"  (1.727 m)   Wt 180 lb 12.8 oz (82 kg)   LMP 06/06/1996   SpO2 100%   BMI 27.49 kg/m    HENT:  Head: Normocephalic and atraumatic.  Right Ear: Hearing, tympanic membrane, external ear and ear canal normal. No foreign bodies.  Left Ear: Hearing, tympanic membrane, external ear and ear canal normal. No foreign bodies.  Nose: Nose normal.  Mouth/Throat: Uvula is midline, oropharynx is clear and moist and mucous membranes are normal. No oral lesions. Normal dentition. No dental abscesses or uvula swelling. No oropharyngeal exudate.  Eyes: Conjunctivae, EOM and lids are normal. Pupils are equal, round, and reactive to light. Right eye exhibits no discharge. Left eye exhibits no discharge. No scleral icterus.    Fundoscopic exam:      The right eye shows no arteriolar narrowing, no AV nicking, no exudate, no hemorrhage and no papilledema. The right eye shows red reflex.       The left eye shows no arteriolar narrowing, no AV nicking, no exudate, no hemorrhage and no papilledema. The left eye shows red reflex.  Neck: Trachea normal, normal range of motion and full passive range of motion without pain. Neck supple. No spinous process tenderness and no muscular tenderness present. No thyroid mass and no thyromegaly present.  Cardiovascular: Normal rate, regular rhythm, normal heart sounds, intact distal pulses and normal pulses.  Pulmonary/Chest: Effort normal and breath sounds normal. Right breast exhibits no inverted nipple, no mass, no nipple discharge, no skin change and no tenderness. Left breast exhibits no inverted nipple, no mass, no nipple discharge, no skin change and no tenderness. Breasts are symmetrical.  Musculoskeletal: She exhibits no edema or tenderness.       Cervical back: Normal.       Thoracic back: Normal.       Lumbar back: Normal.  Lymphadenopathy:       Head (right side): No tonsillar, no preauricular, no posterior auricular and no occipital adenopathy present.       Head (left side): No tonsillar, no preauricular, no posterior auricular and no occipital adenopathy present.    She has no cervical adenopathy.       Right: No supraclavicular adenopathy present.       Left: No supraclavicular adenopathy present.  Neurological: She is alert and oriented to person, place, and time. She has normal strength and normal reflexes. No cranial nerve deficit. She exhibits normal muscle tone. Coordination and gait normal.  Skin: Skin is warm, dry and intact. No rash noted. She is not diaphoretic. No cyanosis or erythema. Nails show no clubbing.  Psychiatric: She has a normal mood and affect. Her speech is normal and behavior is normal. Judgment and thought content normal.       Assessment &  Plan:   Problem List Items Addressed This Visit    Hyperlipidemia    Await labs.      Relevant Orders   Comprehensive metabolic panel   Lipid panel   History of stomach ulcers    GERD symptoms are well controlled on PPI therapy.  Relevant Medications   pantoprazole (PROTONIX) 40 MG tablet   Multinodular goiter    On methimazole. Has not followed-up with endocrinology. Update TSH today, as it remained suppressed in July.       Relevant Orders   TSH   Asthma    Controlled. COntinue current treatment.      Relevant Medications   fluticasone-salmeterol (ADVAIR HFA) 115-21 MCG/ACT inhaler   albuterol (PROVENTIL HFA;VENTOLIN HFA) 108 (90 Base) MCG/ACT inhaler   Hyperthyroidism    TSH remained suppressed in July. She did not follow-up with endocrinology as recommended. Update TSH today and encouraged patient to reschedule with specialist.      Relevant Orders   CBC with Differential/Platelet   Comprehensive metabolic panel   TSH    Other Visit Diagnoses    Annual physical exam    -  Primary   Age appropriate health guidance provided.   Need for influenza vaccination       Relevant Orders   Flu Vaccine QUAD 36+ mos IM (Completed)   Screening for HIV (human immunodeficiency virus)       Relevant Orders   HIV antibody   Encounter for screening mammogram for breast cancer       reportedly done. Will obtain the report.   Screening for blood or protein in urine       Relevant Orders   Urinalysis, dipstick only   Bacterial vaginosis       Relevant Medications   Boric Acid POWD       Return in about 1 year (around 04/02/2018) for wellness exam.   Fara Chute, PA-C [facility]

## 2017-04-02 NOTE — Assessment & Plan Note (Signed)
Controlled. COntinue current treatment. 

## 2017-04-02 NOTE — Assessment & Plan Note (Signed)
TSH remained suppressed in July. She did not follow-up with endocrinology as recommended. Update TSH today and encouraged patient to reschedule with specialist.

## 2017-04-02 NOTE — Assessment & Plan Note (Signed)
Await labs.

## 2017-04-02 NOTE — Assessment & Plan Note (Signed)
On methimazole. Has not followed-up with endocrinology. Update TSH today, as it remained suppressed in July.

## 2017-04-02 NOTE — Patient Instructions (Addendum)
PLEASE SCHEDULE A FOLLOW-UP WITH DR. Loanne Drilling   IF you received an x-ray today, you will receive an invoice from [facility]. Please contact [facility] at [phone] with questions or concerns regarding your invoice.   IF you received labwork today, you will receive an invoice from [facility]. Please contact [facility] at [phone] with questions or concerns regarding your invoice.   Our billing staff will not be able to assist you with questions regarding bills from these companies.  You will be contacted with the lab results as soon as they are available. The fastest way to get your results is to activate your My Chart account. Instructions are located on the last page of this paperwork. If you have not heard from [place] regarding the results in 2 weeks, please contact this office.     Preventive Care 40-64 Years, Female Preventive care refers to lifestyle choices and visits with your health care provider that can promote health and wellness. What does preventive care include?  A yearly physical exam. This is also called an annual well check.  Dental exams once or twice a year.  Routine eye exams. Ask your health care provider how often you should have your eyes checked.  Personal lifestyle choices, including: ? Daily care of your teeth and gums. ? Regular physical activity. ? Eating a healthy diet. ? Avoiding tobacco and drug use. ? Limiting alcohol use. ? Practicing safe sex. ? Taking low-dose aspirin daily starting at age 26. ? Taking vitamin and mineral supplements as recommended by your health care provider. What happens during an annual well check? The services and screenings done by your health care provider during your annual well check will depend on your age, overall health, lifestyle risk factors, and family history of disease. Counseling Your health care provider may ask you questions about your:  Alcohol use.  Tobacco use.  Drug  use.  Emotional well-being.  Home and relationship well-being.  Sexual activity.  Eating habits.  Work and work Statistician.  Method of birth control.  Menstrual cycle.  Pregnancy history.  Screening You may have the following tests or measurements:  Height, weight, and BMI.  Blood pressure.  Lipid and cholesterol levels. These may be checked every 5 years, or more frequently if you are over 7 years old.  Skin check.  Lung cancer screening. You may have this screening every year starting at age 53 if you have a 30-pack-year history of smoking and currently smoke or have quit within the past 15 years.  Fecal occult blood test (FOBT) of the stool. You may have this test every year starting at age 65.  Flexible sigmoidoscopy or colonoscopy. You may have a sigmoidoscopy every 5 years or a colonoscopy every 10 years starting at age 28.  Hepatitis C blood test.  Hepatitis B blood test.  Sexually transmitted disease (STD) testing.  Diabetes screening. This is done by checking your blood sugar (glucose) after you have not eaten for a while (fasting). You may have this done every 1-3 years.  Mammogram. This may be done every 1-2 years. Talk to your health care provider about when you should start having regular mammograms. This may depend on whether you have a family history of breast cancer.  BRCA-related cancer screening. This may be done if you have a family history of breast, ovarian, tubal, or peritoneal cancers.  Pelvic exam and Pap test. This may be done every 3 years starting at age 3. Starting at age 72, this may be  done every 5 years if you have a Pap test in combination with an HPV test.  Bone density scan. This is done to screen for osteoporosis. You may have this scan if you are at high risk for osteoporosis.  Discuss your test results, treatment options, and if necessary, the need for more tests with your health care provider. Vaccines Your health care  provider may recommend certain vaccines, such as:  Influenza vaccine. This is recommended every year.  Tetanus, diphtheria, and acellular pertussis (Tdap, Td) vaccine. You may need a Td booster every 10 years.  Varicella vaccine. You may need this if you have not been vaccinated.  Zoster vaccine. You may need this after age 21.  Measles, mumps, and rubella (MMR) vaccine. You may need at least one dose of MMR if you were born in 1957 or later. You may also need a second dose.  Pneumococcal 13-valent conjugate (PCV13) vaccine. You may need this if you have certain conditions and were not previously vaccinated.  Pneumococcal polysaccharide (PPSV23) vaccine. You may need one or two doses if you smoke cigarettes or if you have certain conditions.  Meningococcal vaccine. You may need this if you have certain conditions.  Hepatitis A vaccine. You may need this if you have certain conditions or if you travel or work in places where you may be exposed to hepatitis A.  Hepatitis B vaccine. You may need this if you have certain conditions or if you travel or work in places where you may be exposed to hepatitis B.  Haemophilus influenzae type b (Hib) vaccine. You may need this if you have certain conditions.  Talk to your health care provider about which screenings and vaccines you need and how often you need them. This information is not intended to replace advice given to you by your health care provider. Make sure you discuss any questions you have with your health care provider. Document Released: 05/10/2015 Document Revised: 01/01/2016 Document Reviewed: 02/12/2015 Elsevier Interactive Patient Education  2017 [employer].

## 2017-04-03 LAB — COMPREHENSIVE METABOLIC PANEL
ALK PHOS: 75 IU/L (ref 39–117)
ALT: 21 IU/L (ref 0–32)
AST: 27 IU/L (ref 0–40)
Albumin/Globulin Ratio: 1.6 (ref 1.2–2.2)
Albumin: 4.4 g/dL (ref 3.5–5.5)
BILIRUBIN TOTAL: 0.7 mg/dL (ref 0.0–1.2)
BUN/Creatinine Ratio: 10 (ref 9–23)
BUN: 10 mg/dL (ref 6–24)
CHLORIDE: 101 mmol/L (ref 96–106)
CO2: 24 mmol/L (ref 20–29)
Calcium: 10.2 mg/dL (ref 8.7–10.2)
Creatinine, Ser: 1.03 mg/dL — ABNORMAL HIGH (ref 0.57–1.00)
GFR calc Af Amer: 74 mL/min/{1.73_m2} (ref >59)
GFR calc non Af Amer: 64 mL/min/{1.73_m2} (ref >59)
GLUCOSE: 84 mg/dL (ref 65–99)
Globulin, Total: 2.8 g/dL (ref 1.5–4.5)
POTASSIUM: 4.2 mmol/L (ref 3.5–5.2)
Sodium: 138 mmol/L (ref 134–144)
Total Protein: 7.2 g/dL (ref 6.0–8.5)

## 2017-04-03 LAB — URINALYSIS, DIPSTICK ONLY
BILIRUBIN UA: NEGATIVE
Glucose, UA: NEGATIVE
KETONES UA: NEGATIVE
Leukocytes, UA: NEGATIVE
NITRITE UA: NEGATIVE
RBC, UA: NEGATIVE
Specific Gravity, UA: 1.021 (ref 1.005–1.030)
UUROB: 0.2 mg/dL (ref 0.2–1.0)
pH, UA: >=9 — AB (ref 5.0–7.5)

## 2017-04-03 LAB — LIPID PANEL
Chol/HDL Ratio: 2.7 ratio (ref 0.0–4.4)
Cholesterol, Total: 230 mg/dL — ABNORMAL HIGH (ref 100–199)
HDL: 84 mg/dL (ref >39)
LDL Calculated: 135 mg/dL — ABNORMAL HIGH (ref 0–99)
TRIGLYCERIDES: 55 mg/dL (ref 0–149)
VLDL Cholesterol Cal: 11 mg/dL (ref 5–40)

## 2017-04-03 LAB — CBC WITH DIFFERENTIAL/PLATELET
BASOS ABS: 0 10*3/uL (ref 0.0–0.2)
Basos: 1 %
EOS (ABSOLUTE): 0.2 10*3/uL (ref 0.0–0.4)
Eos: 3 %
Hematocrit: 40.6 % (ref 34.0–46.6)
Hemoglobin: 13.9 g/dL (ref 11.1–15.9)
IMMATURE GRANS (ABS): 0 10*3/uL (ref 0.0–0.1)
Immature Granulocytes: 0 %
LYMPHS: 27 %
Lymphocytes Absolute: 1.9 10*3/uL (ref 0.7–3.1)
MCH: 30.8 pg (ref 26.6–33.0)
MCHC: 34.2 g/dL (ref 31.5–35.7)
MCV: 90 fL (ref 79–97)
MONOS ABS: 0.4 10*3/uL (ref 0.1–0.9)
Monocytes: 6 %
Neutrophils Absolute: 4.6 10*3/uL (ref 1.4–7.0)
Neutrophils: 63 %
PLATELETS: 350 10*3/uL (ref 150–379)
RBC: 4.52 x10E6/uL (ref 3.77–5.28)
RDW: 14.8 % (ref 12.3–15.4)
WBC: 7.3 10*3/uL (ref 3.4–10.8)

## 2017-04-03 LAB — TSH: TSH: 0.019 u[IU]/mL — ABNORMAL LOW (ref 0.450–4.500)

## 2017-04-03 LAB — HIV ANTIBODY (ROUTINE TESTING W REFLEX): HIV SCREEN 4TH GENERATION: NONREACTIVE

## 2017-04-06 ENCOUNTER — Other Ambulatory Visit: Payer: Self-pay | Admitting: *Deleted

## 2017-04-06 DIAGNOSIS — B9689 Other specified bacterial agents as the cause of diseases classified elsewhere: Secondary | ICD-10-CM

## 2017-04-06 DIAGNOSIS — N760 Acute vaginitis: Principal | ICD-10-CM

## 2017-04-06 MED ORDER — BORIC ACID POWD
600.0000 mg | 0 refills | Status: DC
Start: 2017-04-06 — End: 2017-09-16

## 2017-04-07 ENCOUNTER — Other Ambulatory Visit (INDEPENDENT_AMBULATORY_CARE_PROVIDER_SITE_OTHER): Payer: Self-pay

## 2017-04-07 DIAGNOSIS — M5440 Lumbago with sciatica, unspecified side: Secondary | ICD-10-CM

## 2017-04-10 ENCOUNTER — Encounter: Payer: Self-pay | Admitting: Urgent Care

## 2017-04-10 ENCOUNTER — Ambulatory Visit: Payer: 59 | Admitting: Urgent Care

## 2017-04-10 ENCOUNTER — Other Ambulatory Visit: Payer: Self-pay

## 2017-04-10 VITALS — HR 85 | Temp 98.7°F | Resp 16 | Ht 68.0 in | Wt 182.6 lb

## 2017-04-10 DIAGNOSIS — J069 Acute upper respiratory infection, unspecified: Secondary | ICD-10-CM

## 2017-04-10 DIAGNOSIS — J029 Acute pharyngitis, unspecified: Secondary | ICD-10-CM | POA: Diagnosis not present

## 2017-04-10 DIAGNOSIS — B9789 Other viral agents as the cause of diseases classified elsewhere: Secondary | ICD-10-CM

## 2017-04-10 DIAGNOSIS — J45909 Unspecified asthma, uncomplicated: Secondary | ICD-10-CM

## 2017-04-10 DIAGNOSIS — Z9109 Other allergy status, other than to drugs and biological substances: Secondary | ICD-10-CM

## 2017-04-10 MED ORDER — BENZONATATE 100 MG PO CAPS: 100.0000 mg | ORAL_CAPSULE | Freq: Three times a day (TID) | ORAL | 0 refills | Status: DC | PRN

## 2017-04-10 MED ORDER — LEVOCETIRIZINE DIHYDROCHLORIDE 5 MG PO TABS: 5.0000 mg | ORAL_TABLET | Freq: Every evening | ORAL | 1 refills | Status: DC

## 2017-04-10 MED ORDER — HYDROCODONE-HOMATROPINE 5-1.5 MG/5ML PO SYRP: 5.0000 mL | ORAL_SOLUTION | Freq: Every evening | ORAL | 0 refills | Status: DC | PRN

## 2017-04-10 MED ORDER — ALBUTEROL SULFATE (2.5 MG/3ML) 0.083% IN NEBU: 2.5000 mg | INHALATION_SOLUTION | Freq: Four times a day (QID) | RESPIRATORY_TRACT | 1 refills | Status: DC | PRN

## 2017-04-10 NOTE — Patient Instructions (Addendum)
Upper Respiratory Infection, Adult Most upper respiratory infections (URIs) are a viral infection of the air passages leading to the lungs. A URI affects the nose, throat, and upper air passages. The most common type of URI is nasopharyngitis and is typically referred to as "the common cold." URIs run their course and usually go away on their own. Most of the time, a URI does not require medical attention, but sometimes a bacterial infection in the upper airways can follow a viral infection. This is called a secondary infection. Sinus and middle ear infections are common types of secondary upper respiratory infections. Bacterial pneumonia can also complicate a URI. A URI can worsen asthma and chronic obstructive pulmonary disease (COPD). Sometimes, these complications can require emergency medical care and may be life threatening. What are the causes? Almost all URIs are caused by viruses. A virus is a type of germ and can spread from one person to another. What increases the risk? You may be at risk for a URI if:  You smoke.  You have chronic heart or lung disease.  You have a weakened defense (immune) system.  You are very young or very old.  You have nasal allergies or asthma.  You work in crowded or poorly ventilated areas.  You work in health care facilities or schools.  What are the signs or symptoms? Symptoms typically develop 2-3 days after you come in contact with a cold virus. Most viral URIs last 7-10 days. However, viral URIs from the influenza virus (flu virus) can last 14-18 days and are typically more severe. Symptoms may include:  Runny or stuffy (congested) nose.  Sneezing.  Cough.  Sore throat.  Headache.  Fatigue.  Fever.  Loss of appetite.  Pain in your forehead, behind your eyes, and over your cheekbones (sinus pain).  Muscle aches.  How is this diagnosed? Your health care provider may diagnose a URI by:  Physical exam.  Tests to check that your  symptoms are not due to another condition such as: ? Strep throat. ? Sinusitis. ? Pneumonia. ? Asthma.  How is this treated? A URI goes away on its own with time. It cannot be cured with medicines, but medicines may be prescribed or recommended to relieve symptoms. Medicines may help:  Reduce your fever.  Reduce your cough.  Relieve nasal congestion.  Follow these instructions at home:  Take medicines only as directed by your health care provider.  Gargle warm saltwater or take cough drops to comfort your throat as directed by your health care provider.  Use a warm mist humidifier or inhale steam from a shower to increase air moisture. This may make it easier to breathe.  Drink enough fluid to keep your urine clear or pale yellow.  Eat soups and other clear broths and maintain good nutrition.  Rest as needed.  Return to work when your temperature has returned to normal or as your health care provider advises. You may need to stay home longer to avoid infecting others. You can also use a face mask and careful hand washing to prevent spread of the virus.  Increase the usage of your inhaler if you have asthma.  Do not use any tobacco products, including cigarettes, chewing tobacco, or electronic cigarettes. If you need help quitting, ask your health care provider. How is this prevented? The best way to protect yourself from getting a cold is to practice good hygiene.  Avoid oral or hand contact with people with cold symptoms.  Wash your   hands often if contact occurs.  There is no clear evidence that vitamin C, vitamin E, echinacea, or exercise reduces the chance of developing a cold. However, it is always recommended to get plenty of rest, exercise, and practice good nutrition. Contact a health care provider if:  You are getting worse rather than better.  Your symptoms are not controlled by medicine.  You have chills.  You have worsening shortness of breath.  You have  brown or red mucus.  You have yellow or brown nasal discharge.  You have pain in your face, especially when you bend forward.  You have a fever.  You have swollen neck glands.  You have pain while swallowing.  You have white areas in the back of your throat. Get help right away if:  You have severe or persistent: ? Headache. ? Ear pain. ? Sinus pain. ? Chest pain.  You have chronic lung disease and any of the following: ? Wheezing. ? Prolonged cough. ? Coughing up blood. ? A change in your usual mucus.  You have a stiff neck.  You have changes in your: ? Vision. ? Hearing. ? Thinking. ? Mood. This information is not intended to replace advice given to you by your health care provider. Make sure you discuss any questions you have with your health care provider. Document Released: 10/07/2000 Document Revised: 12/15/2015 Document Reviewed: 07/19/2013 Elsevier Interactive Patient Education  2017 [employer].     IF you received an x-ray today, you will receive an invoice from [facility]. Please contact [facility] at [phone] with questions or concerns regarding your invoice.   IF you received labwork today, you will receive an invoice from [facility]. Please contact [facility] at [phone] with questions or concerns regarding your invoice.   Our billing staff will not be able to assist you with questions regarding bills from these companies.  You will be contacted with the lab results as soon as they are available. The fastest way to get your results is to activate your My Chart account. Instructions are located on the last page of this paperwork. If you have not heard from [place] regarding the results in 2 weeks, please contact this office.

## 2017-04-10 NOTE — Progress Notes (Signed)
  MRN: [id number] DOB: 11/29/1970  Subjective:   Kendra Gonzalez is a 46 y.o. female presenting for 4 day history of dry cough, sore throat, chills, sinus congestion. Cough elicits chest pain, shob, wheezing, has been using her albuterol inhaler once every 4 hours daily. Has tried Mucinex, APAP. She is out of her nebulized albuterol, Xyzal. Denies fever, sinus pain, ear pain, n/v, abdominal pain, rashes. Denies smoking cigarettes or drinking alcohol.    Kendra Gonzalez has a current medication list which includes the following prescription(s): acetaminophen, albuterol, boric acid, fluticasone-salmeterol, glucosamine-chondroitin, ipratropium, magnesium gluconate, methimazole, multivitamin with minerals, pantoprazole, tramadol, tramadol, voltaren, levocetirizine, and xiidra. Also has No Known Allergies.  Kendra Gonzalez  has a past medical history of Allergy, Arthritis, Asthma, Endometriosis, GERD (gastroesophageal reflux disease), History of tetanus, diphtheria, and acellular pertussis booster vaccination (Tdap), Hyperlipidemia, Kidney stones, and Pancreatitis. Also  has a past surgical history that includes Cholecystectomy; Abdominal hysterectomy; oophrectomy (2011); carpel tunnel; arthroscopic knee surgery ; Hip surgery; ERCP (01/08/2012); and Tubal ligation.  Objective:   Vitals: Pulse 85   Temp 98.7 F (37.1 C) (Oral)   Resp 16   Ht 5\' 8"  (1.727 m)   Wt 182 lb 9.6 oz (82.8 kg)   LMP 06/06/1996   SpO2 99%   BMI 27.76 kg/m   Physical Exam  Constitutional: She is oriented to person, place, and time. She appears well-developed and well-nourished.  HENT:  TM's intact bilaterally, no effusions or erythema. Nasal turbinates boggy, nasal passages patent. No sinus tenderness. Oropharynx clear, mucous membranes moist.  Eyes: Right eye exhibits no discharge. Left eye exhibits no discharge.  Neck: Normal range of motion. Neck supple.  Cardiovascular: Normal rate, regular rhythm and intact distal pulses. Exam  reveals no gallop and no friction rub.  No murmur heard. Pulmonary/Chest: No respiratory distress. She has no wheezes. She has no rales.  Lymphadenopathy:    She has no cervical adenopathy.  Neurological: She is alert and oriented to person, place, and time.  Skin: Skin is warm and dry.   Assessment and Plan :   1. Viral URI with cough 2. Sore throat - Will manage supportively for viral illness. Return-to-clinic precautions discussed, patient verbalized understanding.  3. Extrinsic asthma without complication, unspecified asthma severity, unspecified whether persistent - Refilled nebulized albuterol, schedule inhaler once every 4-6 hours.  4. Environmental allergies - Refilled her Xyzal.   Jaynee Eagles, PA-C [facility] [place] [phone] 04/10/2017  11:02 AM

## 2017-04-12 ENCOUNTER — Telehealth: Payer: Self-pay

## 2017-04-12 NOTE — Telephone Encounter (Signed)
Received a notification from [facility] today advising that Advair inhaler is on backorder.  I called the patient and she requests an alternate inhaler be sent to pharmacy. I told her I would notify provider. She was very Patent attorney.

## 2017-04-12 NOTE — Telephone Encounter (Signed)
1. Easiest change is to go to the Advair Diskus, 250/50 1 puff BID 2. Other alternatives are: AirDuo Respiclick, Adair Patter, [place],

## 2017-04-21 ENCOUNTER — Encounter (INDEPENDENT_AMBULATORY_CARE_PROVIDER_SITE_OTHER): Payer: Self-pay | Admitting: *Deleted

## 2017-04-23 ENCOUNTER — Other Ambulatory Visit: Payer: Self-pay

## 2017-04-23 NOTE — Telephone Encounter (Signed)
Inhaler will be available on Monday 04/26/17

## 2017-05-06 DIAGNOSIS — M1612 Unilateral primary osteoarthritis, left hip: Secondary | ICD-10-CM | POA: Diagnosis not present

## 2017-05-06 DIAGNOSIS — M25552 Pain in left hip: Secondary | ICD-10-CM | POA: Diagnosis not present

## 2017-05-16 ENCOUNTER — Encounter (HOSPITAL_COMMUNITY): Payer: Self-pay | Admitting: Emergency Medicine

## 2017-05-16 DIAGNOSIS — R112 Nausea with vomiting, unspecified: Secondary | ICD-10-CM | POA: Diagnosis not present

## 2017-05-16 DIAGNOSIS — R1033 Periumbilical pain: Secondary | ICD-10-CM | POA: Insufficient documentation

## 2017-05-16 NOTE — ED Triage Notes (Signed)
Pt is c/o severe abdominal pain associated with N/V and chills.

## 2017-05-17 ENCOUNTER — Emergency Department (HOSPITAL_COMMUNITY)
Admission: EM | Admit: 2017-05-17 | Discharge: 2017-05-17 | Disposition: A | Discharge status: Home / Self Care | Payer: 59 | Attending: Emergency Medicine | Admitting: Emergency Medicine

## 2017-05-17 DIAGNOSIS — R112 Nausea with vomiting, unspecified: Secondary | ICD-10-CM

## 2017-05-17 DIAGNOSIS — R1033 Periumbilical pain: Secondary | ICD-10-CM

## 2017-05-17 LAB — URINALYSIS, ROUTINE W REFLEX MICROSCOPIC
Bilirubin Urine: NEGATIVE
Glucose, UA: NEGATIVE mg/dL
HGB URINE DIPSTICK: NEGATIVE
Ketones, ur: NEGATIVE mg/dL
Leukocytes, UA: NEGATIVE
NITRITE: NEGATIVE
Protein, ur: 30 mg/dL — AB
SPECIFIC GRAVITY, URINE: 1.023 (ref 1.005–1.030)
pH: 9 — ABNORMAL HIGH (ref 5.0–8.0)

## 2017-05-17 LAB — I-STAT BETA HCG BLOOD, ED (MC, WL, AP ONLY): I-stat hCG, quantitative: <5 m[IU]/mL (ref <5)

## 2017-05-17 LAB — CBC
HEMATOCRIT: 40.1 % (ref 36.0–46.0)
Hemoglobin: 13.9 g/dL (ref 12.0–15.0)
MCH: 30.9 pg (ref 26.0–34.0)
MCHC: 34.7 g/dL (ref 30.0–36.0)
MCV: 89.1 fL (ref 78.0–100.0)
Platelets: 236 10*3/uL (ref 150–400)
RBC: 4.5 MIL/uL (ref 3.87–5.11)
RDW: 13.6 % (ref 11.5–15.5)
WBC: 9.4 10*3/uL (ref 4.0–10.5)

## 2017-05-17 LAB — COMPREHENSIVE METABOLIC PANEL
ALBUMIN: 4.2 g/dL (ref 3.5–5.0)
ALK PHOS: 62 U/L (ref 38–126)
ALT: 19 U/L (ref 14–54)
AST: 25 U/L (ref 15–41)
Anion gap: 8 (ref 5–15)
BILIRUBIN TOTAL: 1.2 mg/dL (ref 0.3–1.2)
BUN: 13 mg/dL (ref 6–20)
CALCIUM: 9.5 mg/dL (ref 8.9–10.3)
CO2: 25 mmol/L (ref 22–32)
Chloride: 100 mmol/L — ABNORMAL LOW (ref 101–111)
Creatinine, Ser: 0.85 mg/dL (ref 0.44–1.00)
GFR calc Af Amer: >60 mL/min (ref >60)
GFR calc non Af Amer: >60 mL/min (ref >60)
GLUCOSE: 114 mg/dL — AB (ref 65–99)
Potassium: 3.7 mmol/L (ref 3.5–5.1)
Sodium: 133 mmol/L — ABNORMAL LOW (ref 135–145)
TOTAL PROTEIN: 7.6 g/dL (ref 6.5–8.1)

## 2017-05-17 LAB — LIPASE, BLOOD: Lipase: 22 U/L (ref 11–51)

## 2017-05-17 MED ORDER — ONDANSETRON 8 MG PO TBDP: 8.0000 mg | ORAL_TABLET | Freq: Three times a day (TID) | ORAL | 0 refills | Status: DC | PRN

## 2017-05-17 MED ORDER — ONDANSETRON HCL 4 MG/2ML IJ SOLN
4.0000 mg | Freq: Once | INTRAMUSCULAR | Status: AC | PRN
  Administered 2017-05-17: 4 mg via INTRAVENOUS
  Filled 2017-05-17: qty 2

## 2017-05-17 MED ORDER — SODIUM CHLORIDE 0.9 % IV BOLUS (SEPSIS)
1000.0000 mL | Freq: Once | INTRAVENOUS | Status: AC
  Administered 2017-05-17: 1000 mL via INTRAVENOUS

## 2017-05-17 NOTE — ED Provider Notes (Signed)
[place] DEPT Provider Note: Georgena Spurling, MD, FACEP  CSN: [id number] MRN: [id number] ARRIVAL: 05/16/17 at 2257 ROOM: [place]  Abdominal Pain   HISTORY OF PRESENT ILLNESS  05/17/17 3:34 AM Kendra Gonzalez is a 47 y.o. female who had the onset of mid abdominal pain yesterday evening about 6 PM associated with nausea and vomiting.  She vomited 2 times.  She rated her pain as a 10 out of 10 on arrival.  She characterized the pain as sharp.  The pain has significantly improved since and is mild at the present time.  She was given Zofran IV on arrival with no further vomiting.  She has been eating ice chips without difficulty.  She denies associated diarrhea.  She has had a fever as high as 102 at home.   Past Medical History:  Diagnosis Date  . Allergy   . Arthritis   . Asthma   . Endometriosis   . GERD (gastroesophageal reflux disease)   . History of tetanus, diphtheria, and acellular pertussis booster vaccination (Tdap)    07/30/2010  . Hyperlipidemia   . Kidney stones   . Pancreatitis     Past Surgical History:  Procedure Laterality Date  . ABDOMINAL HYSTERECTOMY    . arthroscopic knee surgery     . carpel tunnel    . CHOLECYSTECTOMY    . ERCP  01/08/2012   Procedure: ENDOSCOPIC RETROGRADE CHOLANGIOPANCREATOGRAPHY (ERCP);  Surgeon: Beryle Beams, MD;  Location: [facility] ENDOSCOPY;  Service: Endoscopy;  Laterality: N/A;  . HIP SURGERY    . oophrectomy  2011   secondary to cysts  . TUBAL LIGATION      Family History  Problem Relation Age of Onset  . Heart disease Daughter   . Hypertension Father   . Hypertension Sister   . Hypertension Brother   . Hypertension Maternal Grandfather   . Heart disease Maternal Grandfather   . Hypertension Paternal Grandfather   . Hypertension Brother   . Hypertension Sister   . Thyroid disease Neg Hx     Social History   Tobacco Use  . Smoking status: Never Smoker  . Smokeless tobacco: Never Used    Substance Use Topics  . Alcohol use: No  . Drug use: No    Prior to Admission medications   Medication Sig Start Date End Date Taking? Authorizing Provider  acetaminophen (TYLENOL) 500 MG tablet Take 1,000 mg by mouth every 6 (six) hours as needed for mild pain or headache.    [provider]  albuterol (PROVENTIL HFA;VENTOLIN HFA) 108 (90 Base) MCG/ACT inhaler Inhale 2 puffs into the lungs every 6 (six) hours as needed for wheezing or shortness of breath. 04/02/17   Harrison Mons, PA-C  albuterol (PROVENTIL) (2.5 MG/3ML) 0.083% nebulizer solution Take 3 mLs (2.5 mg total) by nebulization every 6 (six) hours as needed for wheezing or shortness of breath. 04/10/17   Jaynee Eagles, PA-C  benzonatate (TESSALON) 100 MG capsule Take 1-2 capsules (100-200 mg total) by mouth 3 (three) times daily as needed. 04/10/17   Jaynee Eagles, PA-C  Boric Acid POWD Place 600 mg vaginally as directed. Place 600 mg capsule intravaginally once a week as needed for irritation: Boric acid suppository 04/06/17   Weber, Damaris Hippo, PA-C  fluticasone-salmeterol (ADVAIR HFA) 115-21 MCG/ACT inhaler Inhale 2 puffs into the lungs 2 (two) times daily. 04/02/17   Harrison Mons, PA-C  glucosamine-chondroitin 500-400 MG tablet Take 1 tablet by mouth 3 (three) times daily  as needed (joint pain).     [provider]  HYDROcodone-homatropine (HYCODAN) 5-1.5 MG/5ML syrup Take 5 mLs by mouth at bedtime as needed. 04/10/17   Jaynee Eagles, PA-C  ipratropium (ATROVENT) 0.03 % nasal spray Place 2 sprays into the nose 4 (four) times daily. 10/19/15   Shawnee Knapp, MD  levocetirizine (XYZAL) 5 MG tablet Take 1 tablet (5 mg total) by mouth every evening. 04/10/17   Jaynee Eagles, PA-C  magnesium gluconate (MAGONATE) 500 MG tablet Take 500 mg by mouth at bedtime.    [provider]  methimazole (TAPAZOLE) 5 MG tablet Take 1 tablet (5 mg total) by mouth daily. 11/03/16   Renato Shin, MD  Multiple Vitamins-Minerals  (MULTIVITAMIN WITH MINERALS) tablet Take 1 tablet by mouth daily.    [provider]  pantoprazole (PROTONIX) 40 MG tablet Take 1 tablet (40 mg total) by mouth daily. 04/02/17   Harrison Mons, PA-C  traMADol (ULTRAM) 50 MG tablet Take by mouth every 6 (six) hours as needed.    [provider]  traMADol Veatrice Bourbon) 50 MG tablet Take 1-2 tablets po bid prn. 02/05/17   Garald Balding, MD  VOLTAREN 1 % GEL  09/13/15   [provider]  XIIDRA 5 % SOLN  11/14/16   [provider]    Allergies Patient has no known allergies.   REVIEW OF SYSTEMS  Negative except as noted here or in the History of Present Illness.   PHYSICAL EXAMINATION  Initial Vital Signs Blood pressure (!) 134/92, pulse 94, temperature 99.2 F (37.3 C), temperature source Oral, resp. rate 18, height 5\' 4"  (1.626 m), weight 83 kg (183 lb), last menstrual period 06/06/1996, SpO2 99 %.  Examination General: Well-developed, well-nourished female in no acute distress; appearance consistent with age of record HENT: normocephalic; atraumatic Eyes: pupils equal, round and reactive to light; extraocular muscles intact Neck: supple Heart: regular rate and rhythm Lungs: clear to auscultation bilaterally Abdomen: soft; nondistended; nontender; no masses or hepatosplenomegaly; bowel sounds present Extremities: No deformity; full range of motion; pulses normal Neurologic: Awake, alert and oriented; motor function intact in all extremities and symmetric; no facial droop Skin: Warm and dry Psychiatric: Normal mood and affect   RESULTS  Summary of this visit's results, reviewed by myself:   EKG Interpretation  Date/Time:    Ventricular Rate:    PR Interval:    QRS Duration:   QT Interval:    QTC Calculation:   R Axis:     Text Interpretation:        Laboratory Studies: Results for orders placed or performed during the hospital encounter of 05/17/17 (from the past 24 hour(s))  Lipase,  blood     Status: None   Collection Time: 05/17/17 12:10 AM  Result Value Ref Range   Lipase 22 11 - 51 U/L  Comprehensive metabolic panel     Status: Abnormal   Collection Time: 05/17/17 12:10 AM  Result Value Ref Range   Sodium 133 (L) 135 - 145 mmol/L   Potassium 3.7 3.5 - 5.1 mmol/L   Chloride 100 (L) 101 - 111 mmol/L   CO2 25 22 - 32 mmol/L   Glucose, Bld 114 (H) 65 - 99 mg/dL   BUN 13 6 - 20 mg/dL   Creatinine, Ser 0.85 0.44 - 1.00 mg/dL   Calcium 9.5 8.9 - 10.3 mg/dL   Total Protein 7.6 6.5 - 8.1 g/dL   Albumin 4.2 3.5 - 5.0 g/dL   AST 25  15 - 41 U/L   ALT 19 14 - 54 U/L   Alkaline Phosphatase 62 38 - 126 U/L   Total Bilirubin 1.2 0.3 - 1.2 mg/dL   GFR calc non Af Amer >60 >60 mL/min   GFR calc Af Amer >60 >60 mL/min   Anion gap 8 5 - 15  CBC     Status: None   Collection Time: 05/17/17 12:10 AM  Result Value Ref Range   WBC 9.4 4.0 - 10.5 K/uL   RBC 4.50 3.87 - 5.11 MIL/uL   Hemoglobin 13.9 12.0 - 15.0 g/dL   HCT 40.1 36.0 - 46.0 %   MCV 89.1 78.0 - 100.0 fL   MCH 30.9 26.0 - 34.0 pg   MCHC 34.7 30.0 - 36.0 g/dL   RDW 13.6 11.5 - 15.5 %   Platelets 236 150 - 400 K/uL  Urinalysis, Routine w reflex microscopic     Status: Abnormal   Collection Time: 05/17/17 12:24 AM  Result Value Ref Range   Color, Urine YELLOW YELLOW   APPearance CLEAR CLEAR   Specific Gravity, Urine 1.023 1.005 - 1.030   pH 9.0 (H) 5.0 - 8.0   Glucose, UA NEGATIVE NEGATIVE mg/dL   Hgb urine dipstick NEGATIVE NEGATIVE   Bilirubin Urine NEGATIVE NEGATIVE   Ketones, ur NEGATIVE NEGATIVE mg/dL   Protein, ur 30 (A) NEGATIVE mg/dL   Nitrite NEGATIVE NEGATIVE   Leukocytes, UA NEGATIVE NEGATIVE   RBC / HPF 0-5 0 - 5 RBC/hpf   WBC, UA 0-5 0 - 5 WBC/hpf   Bacteria, UA RARE (A) NONE SEEN   Squamous Epithelial / LPF 0-5 (A) NONE SEEN   Mucus PRESENT   I-Stat beta hCG blood, ED     Status: None   Collection Time: 05/17/17 12:24 AM  Result Value Ref Range   I-stat hCG, quantitative <5.0 <5 mIU/mL     Comment 3           Imaging Studies: No results found.  ED COURSE  Nursing notes and initial vitals signs, including pulse oximetry, reviewed.  Vitals:   05/17/17 0230 05/17/17 0330 05/17/17 0345 05/17/17 0400  BP: (!) 134/92 132/84  (!) 146/98  Pulse: 94  99 98  Resp:    18  Temp:      TempSrc:      SpO2: 99%  98% 96%  Weight:      Height:       5:12 AM Patient feeling better, states she is ready to go home.  PROCEDURES    ED DIAGNOSES     ICD-10-CM   1. Periumbilical abdominal pain R10.33   2. Nausea and vomiting in adult R11.2        Shanon Rosser, MD 05/17/17 [phone]

## 2017-06-04 DIAGNOSIS — M25551 Pain in right hip: Secondary | ICD-10-CM | POA: Diagnosis not present

## 2017-06-04 DIAGNOSIS — M25552 Pain in left hip: Secondary | ICD-10-CM | POA: Diagnosis not present

## 2017-06-10 DIAGNOSIS — E0590 Thyrotoxicosis, unspecified without thyrotoxic crisis or storm: Secondary | ICD-10-CM | POA: Diagnosis not present

## 2017-06-10 DIAGNOSIS — K219 Gastro-esophageal reflux disease without esophagitis: Secondary | ICD-10-CM | POA: Diagnosis not present

## 2017-06-10 DIAGNOSIS — J4520 Mild intermittent asthma, uncomplicated: Secondary | ICD-10-CM | POA: Diagnosis not present

## 2017-06-14 DIAGNOSIS — M1612 Unilateral primary osteoarthritis, left hip: Secondary | ICD-10-CM | POA: Diagnosis not present

## 2017-06-16 ENCOUNTER — Other Ambulatory Visit: Payer: Self-pay

## 2017-06-16 ENCOUNTER — Ambulatory Visit: Payer: 59 | Admitting: Family Medicine

## 2017-06-16 ENCOUNTER — Encounter: Payer: Self-pay | Admitting: Family Medicine

## 2017-06-16 VITALS — BP 137/88 | HR 99 | Temp 99.4°F | Resp 17 | Ht 64.0 in | Wt 181.4 lb

## 2017-06-16 DIAGNOSIS — B9789 Other viral agents as the cause of diseases classified elsewhere: Secondary | ICD-10-CM

## 2017-06-16 DIAGNOSIS — J00 Acute nasopharyngitis [common cold]: Secondary | ICD-10-CM

## 2017-06-16 DIAGNOSIS — J069 Acute upper respiratory infection, unspecified: Secondary | ICD-10-CM | POA: Diagnosis not present

## 2017-06-16 NOTE — Patient Instructions (Addendum)
You should not take Dextromorphan or Phenylephrine These are common found in decongestant and cold medications This can cause very high pulses and high blood pressure.  Ask you pharmacist to make sure this ingredient is not present     IF you received an x-ray today, you will receive an invoice from [facility]. Please contact [facility] at [phone] with questions or concerns regarding your invoice.   IF you received labwork today, you will receive an invoice from [facility]. Please contact [facility] at [phone] with questions or concerns regarding your invoice.   Our billing staff will not be able to assist you with questions regarding bills from these companies.  You will be contacted with the lab results as soon as they are available. The fastest way to get your results is to activate your My Chart account. Instructions are located on the last page of this paperwork. If you have not heard from [place] regarding the results in 2 weeks, please contact this office.     Upper Respiratory Infection, Adult Most upper respiratory infections (URIs) are a viral infection of the air passages leading to the lungs. A URI affects the nose, throat, and upper air passages. The most common type of URI is nasopharyngitis and is typically referred to as "the common cold." URIs run their course and usually go away on their own. Most of the time, a URI does not require medical attention, but sometimes a bacterial infection in the upper airways can follow a viral infection. This is called a secondary infection. Sinus and middle ear infections are common types of secondary upper respiratory infections. Bacterial pneumonia can also complicate a URI. A URI can worsen asthma and chronic obstructive pulmonary disease (COPD). Sometimes, these complications can require emergency medical care and may be life threatening. What are the causes? Almost all URIs are caused by viruses. A virus is a type  of germ and can spread from one person to another. What increases the risk? You may be at risk for a URI if:  You smoke.  You have chronic heart or lung disease.  You have a weakened defense (immune) system.  You are very young or very old.  You have nasal allergies or asthma.  You work in crowded or poorly ventilated areas.  You work in health care facilities or schools.  What are the signs or symptoms? Symptoms typically develop 2-3 days after you come in contact with a cold virus. Most viral URIs last 7-10 days. However, viral URIs from the influenza virus (flu virus) can last 14-18 days and are typically more severe. Symptoms may include:  Runny or stuffy (congested) nose.  Sneezing.  Cough.  Sore throat.  Headache.  Fatigue.  Fever.  Loss of appetite.  Pain in your forehead, behind your eyes, and over your cheekbones (sinus pain).  Muscle aches.  How is this diagnosed? Your health care provider may diagnose a URI by:  Physical exam.  Tests to check that your symptoms are not due to another condition such as: ? Strep throat. ? Sinusitis. ? Pneumonia. ? Asthma.  How is this treated? A URI goes away on its own with time. It cannot be cured with medicines, but medicines may be prescribed or recommended to relieve symptoms. Medicines may help:  Reduce your fever.  Reduce your cough.  Relieve nasal congestion.  Follow these instructions at home:  Take medicines only as directed by your health care provider.  Gargle warm saltwater or take cough drops to  comfort your throat as directed by your health care provider.  Use a warm mist humidifier or inhale steam from a shower to increase air moisture. This may make it easier to breathe.  Drink enough fluid to keep your urine clear or pale yellow.  Eat soups and other clear broths and maintain good nutrition.  Rest as needed.  Return to work when your temperature has returned to normal or as your  health care provider advises. You may need to stay home longer to avoid infecting others. You can also use a face mask and careful hand washing to prevent spread of the virus.  Increase the usage of your inhaler if you have asthma.  Do not use any tobacco products, including cigarettes, chewing tobacco, or electronic cigarettes. If you need help quitting, ask your health care provider. How is this prevented? The best way to protect yourself from getting a cold is to practice good hygiene.  Avoid oral or hand contact with people with cold symptoms.  Wash your hands often if contact occurs.  There is no clear evidence that vitamin C, vitamin E, echinacea, or exercise reduces the chance of developing a cold. However, it is always recommended to get plenty of rest, exercise, and practice good nutrition. Contact a health care provider if:  You are getting worse rather than better.  Your symptoms are not controlled by medicine.  You have chills.  You have worsening shortness of breath.  You have brown or red mucus.  You have yellow or brown nasal discharge.  You have pain in your face, especially when you bend forward.  You have a fever.  You have swollen neck glands.  You have pain while swallowing.  You have white areas in the back of your throat. Get help right away if:  You have severe or persistent: ? Headache. ? Ear pain. ? Sinus pain. ? Chest pain.  You have chronic lung disease and any of the following: ? Wheezing. ? Prolonged cough. ? Coughing up blood. ? A change in your usual mucus.  You have a stiff neck.  You have changes in your: ? Vision. ? Hearing. ? Thinking. ? Mood. This information is not intended to replace advice given to you by your health care provider. Make sure you discuss any questions you have with your health care provider. Document Released: 10/07/2000 Document Revised: 12/15/2015 Document Reviewed: 07/19/2013 Elsevier Interactive  Patient Education  [employer].

## 2017-06-16 NOTE — Progress Notes (Signed)
Chief Complaint  Patient presents with  . possible sinus infection    chest hurts, nasal congestion, postnasal drip and sinus pressure headache, clear mucus drainage, no otc sinus med not helping    HPI  Pt reports that she has been coughing and sneezing for 2 days She states that her cough is dry and causes her chest to hurt after coughing spells She has sore throat She gargled with warm salt water She has postnasal drip and nasal congestion When she blows her nose it is clear She has been taking ricola  She also takes otc sinus meds She states that she took sudafed She reports that she has not seen much improvement   Past Medical History:  Diagnosis Date  . Allergy   . Arthritis   . Asthma   . Endometriosis   . GERD (gastroesophageal reflux disease)   . History of tetanus, diphtheria, and acellular pertussis booster vaccination (Tdap)    07/30/2010  . Hyperlipidemia   . Kidney stones   . Pancreatitis     Current Outpatient Medications  Medication Sig Dispense Refill  . acetaminophen (TYLENOL) 500 MG tablet Take 1,000 mg by mouth every 6 (six) hours as needed for mild pain or headache.    . albuterol (PROVENTIL HFA;VENTOLIN HFA) 108 (90 Base) MCG/ACT inhaler Inhale 2 puffs into the lungs every 6 (six) hours as needed for wheezing or shortness of breath. 1 Inhaler 6  . albuterol (PROVENTIL) (2.5 MG/3ML) 0.083% nebulizer solution Take 3 mLs (2.5 mg total) by nebulization every 6 (six) hours as needed for wheezing or shortness of breath. 150 mL 1  . Boric Acid POWD Place 600 mg vaginally as directed. Place 600 mg capsule intravaginally once a week as needed for irritation: Boric acid suppository 113 g 0  . fluticasone-salmeterol (ADVAIR HFA) 115-21 MCG/ACT inhaler Inhale 2 puffs into the lungs 2 (two) times daily. 1 Inhaler 11  . glucosamine-chondroitin 500-400 MG tablet Take 1 tablet by mouth 3 (three) times daily as needed (joint pain).     Marland Kitchen ipratropium (ATROVENT) 0.03 %  nasal spray Place 2 sprays into the nose 4 (four) times daily. 30 mL 0  . levocetirizine (XYZAL) 5 MG tablet Take 1 tablet (5 mg total) by mouth every evening. 90 tablet 1  . magnesium gluconate (MAGONATE) 500 MG tablet Take 500 mg by mouth at bedtime.    . methimazole (TAPAZOLE) 5 MG tablet Take 1 tablet (5 mg total) by mouth daily. 30 tablet 3  . Multiple Vitamins-Minerals (MULTIVITAMIN WITH MINERALS) tablet Take 1 tablet by mouth daily.    . ondansetron (ZOFRAN ODT) 8 MG disintegrating tablet Take 1 tablet (8 mg total) by mouth every 8 (eight) hours as needed for nausea or vomiting. 10 tablet 0  . pantoprazole (PROTONIX) 40 MG tablet Take 1 tablet (40 mg total) by mouth daily. 30 tablet 5  . traMADol (ULTRAM) 50 MG tablet Take by mouth every 6 (six) hours as needed.    . traMADol (ULTRAM) 50 MG tablet Take 1-2 tablets po bid prn. 30 tablet 0  . VOLTAREN 1 % GEL      No current facility-administered medications for this visit.     Allergies: No Known Allergies  Past Surgical History:  Procedure Laterality Date  . ABDOMINAL HYSTERECTOMY    . arthroscopic knee surgery     . carpel tunnel    . CHOLECYSTECTOMY    . ERCP  01/08/2012   Procedure: ENDOSCOPIC RETROGRADE CHOLANGIOPANCREATOGRAPHY (ERCP);  Surgeon:  Beryle Beams, MD;  Location: [facility];  Service: Endoscopy;  Laterality: N/A;  . HIP SURGERY    . oophrectomy  2011   secondary to cysts  . TUBAL LIGATION      Social History   Socioeconomic History  . Marital status: Married    Spouse name: Fritz Pickerel  . Number of children: 2  . Years of education: None  . Highest education level: None  Social Needs  . Financial resource strain: None  . Food insecurity - worry: None  . Food insecurity - inability: None  . Transportation needs - medical: None  . Transportation needs - non-medical: None  Occupational History  . Occupation: Nursing Aid    Employer: [employer]  Tobacco Use  . Smoking status: Never Smoker  . Smokeless  tobacco: Never Used  Substance and Sexual Activity  . Alcohol use: No  . Drug use: No  . Sexual activity: Yes    Birth control/protection: None  Other Topics Concern  . None  Social History Narrative   Works as a Education officer, environmental.     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   Adult children live in [place], [place] and [place].    Family History  Problem Relation Age of Onset  . Heart disease Daughter   . Hypertension Father   . Hypertension Sister   . Hypertension Brother   . Hypertension Maternal Grandfather   . Heart disease Maternal Grandfather   . Hypertension Paternal Grandfather   . Hypertension Brother   . Hypertension Sister   . Thyroid disease Neg Hx      ROS Review of Systems See HPI Constitution: No fevers or chills No malaise No diaphoresis Skin: No rash or itching Eyes: no blurry vision, no double vision GU: no dysuria or hematuria Neuro: no dizziness or headaches * all others reviewed and negative   Objective: Vitals:   06/16/17 1705  BP: 137/88  Pulse: 99  Resp: 17  Temp: 99.4 F (37.4 C)  TempSrc: Oral  SpO2: 99%  Weight: 181 lb 6.4 oz (82.3 kg)  Height: 5\' 4"  (1.626 m)     Physical Exam  General: alert, oriented, in NAD Head: normocephalic, atraumatic, no sinus tenderness Eyes: EOM intact, no scleral icterus or conjunctival injection Ears: TM clear bilaterally Nose: mucosa nonerythematous, nonedematous Throat: no pharyngeal exudate or erythema Lymph: no posterior auricular, submental or cervical lymph adenopathy Heart: normal rate, normal sinus rhythm, no murmurs Lungs: clear to auscultation bilaterally, no wheezing   Assessment and Plan Ayanah was seen today for possible sinus infection.  Diagnoses and all orders for this visit:  Viral URI with cough  Acute rhinitis   Advised supportive care Stop sudafed and switch to plain mucinex, flonase and zyrtec   Raneen Jaffer A Nolon Rod

## 2017-06-21 DIAGNOSIS — R768 Other specified abnormal immunological findings in serum: Secondary | ICD-10-CM | POA: Diagnosis not present

## 2017-06-22 DIAGNOSIS — Z96642 Presence of left artificial hip joint: Secondary | ICD-10-CM | POA: Diagnosis not present

## 2017-06-22 DIAGNOSIS — E669 Obesity, unspecified: Secondary | ICD-10-CM | POA: Diagnosis not present

## 2017-06-22 DIAGNOSIS — Z6831 Body mass index (BMI) 31.0-31.9, adult: Secondary | ICD-10-CM | POA: Diagnosis not present

## 2017-06-22 DIAGNOSIS — E785 Hyperlipidemia, unspecified: Secondary | ICD-10-CM | POA: Diagnosis not present

## 2017-06-22 DIAGNOSIS — Z471 Aftercare following joint replacement surgery: Secondary | ICD-10-CM | POA: Diagnosis not present

## 2017-06-22 DIAGNOSIS — M1612 Unilateral primary osteoarthritis, left hip: Secondary | ICD-10-CM | POA: Diagnosis not present

## 2017-06-22 DIAGNOSIS — J4520 Mild intermittent asthma, uncomplicated: Secondary | ICD-10-CM | POA: Diagnosis not present

## 2017-06-22 DIAGNOSIS — K219 Gastro-esophageal reflux disease without esophagitis: Secondary | ICD-10-CM | POA: Diagnosis not present

## 2017-06-25 DIAGNOSIS — Z471 Aftercare following joint replacement surgery: Secondary | ICD-10-CM | POA: Diagnosis not present

## 2017-06-25 DIAGNOSIS — J45909 Unspecified asthma, uncomplicated: Secondary | ICD-10-CM | POA: Diagnosis not present

## 2017-06-29 DIAGNOSIS — J45909 Unspecified asthma, uncomplicated: Secondary | ICD-10-CM | POA: Diagnosis not present

## 2017-06-29 DIAGNOSIS — Z471 Aftercare following joint replacement surgery: Secondary | ICD-10-CM | POA: Diagnosis not present

## 2017-07-02 DIAGNOSIS — J45909 Unspecified asthma, uncomplicated: Secondary | ICD-10-CM | POA: Diagnosis not present

## 2017-07-02 DIAGNOSIS — Z471 Aftercare following joint replacement surgery: Secondary | ICD-10-CM | POA: Diagnosis not present

## 2017-07-05 DIAGNOSIS — J45909 Unspecified asthma, uncomplicated: Secondary | ICD-10-CM | POA: Diagnosis not present

## 2017-07-05 DIAGNOSIS — Z471 Aftercare following joint replacement surgery: Secondary | ICD-10-CM | POA: Diagnosis not present

## 2017-07-08 DIAGNOSIS — Z471 Aftercare following joint replacement surgery: Secondary | ICD-10-CM | POA: Diagnosis not present

## 2017-07-08 DIAGNOSIS — J45909 Unspecified asthma, uncomplicated: Secondary | ICD-10-CM | POA: Diagnosis not present

## 2017-07-09 DIAGNOSIS — Z96642 Presence of left artificial hip joint: Secondary | ICD-10-CM | POA: Insufficient documentation

## 2017-07-12 DIAGNOSIS — Z471 Aftercare following joint replacement surgery: Secondary | ICD-10-CM | POA: Diagnosis not present

## 2017-07-12 DIAGNOSIS — J45909 Unspecified asthma, uncomplicated: Secondary | ICD-10-CM | POA: Diagnosis not present

## 2017-07-15 DIAGNOSIS — M25552 Pain in left hip: Secondary | ICD-10-CM | POA: Diagnosis not present

## 2017-07-20 DIAGNOSIS — M25552 Pain in left hip: Secondary | ICD-10-CM | POA: Diagnosis not present

## 2017-07-22 DIAGNOSIS — M25552 Pain in left hip: Secondary | ICD-10-CM | POA: Diagnosis not present

## 2017-07-23 ENCOUNTER — Ambulatory Visit: Payer: 59 | Admitting: Physical Therapy

## 2017-07-27 DIAGNOSIS — M25552 Pain in left hip: Secondary | ICD-10-CM | POA: Diagnosis not present

## 2017-07-29 DIAGNOSIS — M25552 Pain in left hip: Secondary | ICD-10-CM | POA: Diagnosis not present

## 2017-08-03 DIAGNOSIS — M25552 Pain in left hip: Secondary | ICD-10-CM | POA: Diagnosis not present

## 2017-08-05 DIAGNOSIS — M25552 Pain in left hip: Secondary | ICD-10-CM | POA: Diagnosis not present

## 2017-08-06 DIAGNOSIS — M25552 Pain in left hip: Secondary | ICD-10-CM | POA: Diagnosis not present

## 2017-08-10 DIAGNOSIS — M25552 Pain in left hip: Secondary | ICD-10-CM | POA: Diagnosis not present

## 2017-09-16 ENCOUNTER — Ambulatory Visit: Payer: 59 | Admitting: Physician Assistant

## 2017-09-16 ENCOUNTER — Other Ambulatory Visit: Payer: Self-pay

## 2017-09-16 ENCOUNTER — Encounter: Payer: Self-pay | Admitting: Physician Assistant

## 2017-09-16 VITALS — BP 132/80 | HR 91 | Temp 97.6°F | Resp 18 | Ht 65.55 in | Wt 180.0 lb

## 2017-09-16 DIAGNOSIS — Z8711 Personal history of peptic ulcer disease: Secondary | ICD-10-CM

## 2017-09-16 DIAGNOSIS — Z9109 Other allergy status, other than to drugs and biological substances: Secondary | ICD-10-CM

## 2017-09-16 DIAGNOSIS — B9689 Other specified bacterial agents as the cause of diseases classified elsewhere: Secondary | ICD-10-CM

## 2017-09-16 DIAGNOSIS — N951 Menopausal and female climacteric states: Secondary | ICD-10-CM | POA: Diagnosis not present

## 2017-09-16 DIAGNOSIS — Z8719 Personal history of other diseases of the digestive system: Secondary | ICD-10-CM

## 2017-09-16 DIAGNOSIS — N760 Acute vaginitis: Secondary | ICD-10-CM | POA: Diagnosis not present

## 2017-09-16 MED ORDER — VENLAFAXINE HCL ER 37.5 MG PO CP24: 37.5000 mg | ORAL_CAPSULE | Freq: Every day | ORAL | 1 refills | Status: DC

## 2017-09-16 MED ORDER — PANTOPRAZOLE SODIUM 40 MG PO TBEC: 40.0000 mg | DELAYED_RELEASE_TABLET | Freq: Every day | ORAL | 4 refills | Status: DC

## 2017-09-16 MED ORDER — BORIC ACID POWD: 600.0000 mg | 0 refills | Status: DC

## 2017-09-16 MED ORDER — LEVOCETIRIZINE DIHYDROCHLORIDE 5 MG PO TABS: 5.0000 mg | ORAL_TABLET | Freq: Every evening | ORAL | 4 refills | Status: DC

## 2017-09-16 NOTE — Progress Notes (Signed)
Kendra Gonzalez  MRN: [id number] DOB: 1970-07-27  PCP: Mancel Bale, PA-C  Chief Complaint  Patient presents with  . Menopause    X 3 week- hot and chills- pt states possible menopause  . Medication Refill    Boric Acid, Protonix, Xyzal  . Depression    screening done    Subjective:  Pt presents to clinic for medication refills and hot flashes.  GERD with history of stomach ulcers - she needs protonix which keeps her symptoms controlled.  She has recurrent bacterial vaginosis and uses boric acid vaginal suppositories for it.    Has seasonal allergies and uses xyzal which controls her symptoms.  She needs refills.  She has been having hot flashes for the last several weeks.  She gets them all day long and into the night.  They are disrupting her sleep. She is more irritable than normal but she also recognizes that her chronic hip pain even after hip replacement is very frustrating to her.  She lashes out at people when she is frustrated.  She thinks that hot flashes also aggravate her mood.     History is obtained by patient.  Review of Systems  Constitutional: Negative for chills and fever.  Endocrine: Positive for cold intolerance and heat intolerance.  Genitourinary: Negative for vaginal discharge (not currently).  Psychiatric/Behavioral: Positive for sleep disturbance (2nd to hot flashes and left hip pain). The patient is nervous/anxious (irritable).     Patient Active Problem List   Diagnosis Date Noted  . Presence of left artificial hip joint 07/09/2017  . Hyperthyroidism 11/03/2016  . Multinodular goiter 04/17/2015  . History of stomach ulcers 03/22/2015  . Chronic pain of left heel 04/23/2014  . Chronic pain syndrome 03/19/2014  . Asthma 01/04/2013  . Abdominal pain, chronic, right upper quadrant 06/23/2012  . BMI 29.0-29.9,adult 10/12/2011  . 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 Trochanteric bursitis of left hip 01/22/2011  . Hyperlipidemia 02/16/2007    . ALLERGIC RHINITIS 02/16/2007  . GERD 02/16/2007    Current Outpatient Medications on File Prior to Visit  Medication Sig Dispense Refill  . albuterol (PROVENTIL HFA;VENTOLIN HFA) 108 (90 Base) MCG/ACT inhaler Inhale 2 puffs into the lungs every 6 (six) hours as needed for wheezing or shortness of breath. 1 Inhaler 6  . albuterol (PROVENTIL) (2.5 MG/3ML) 0.083% nebulizer solution Take 3 mLs (2.5 mg total) by nebulization every 6 (six) hours as needed for wheezing or shortness of breath. 150 mL 1  . fluticasone-salmeterol (ADVAIR HFA) 115-21 MCG/ACT inhaler Inhale 2 puffs into the lungs 2 (two) times daily. 1 Inhaler 11  . magnesium gluconate (MAGONATE) 500 MG tablet Take 500 mg by mouth at bedtime.    . methimazole (TAPAZOLE) 5 MG tablet Take 1 tablet (5 mg total) by mouth daily. 30 tablet 3  . Multiple Vitamins-Minerals (MULTIVITAMIN WITH MINERALS) tablet Take 1 tablet by mouth daily.    . VOLTAREN 1 % GEL      No current facility-administered medications on file prior to visit.     Allergies  Allergen Reactions  . Hydromorphone Itching    Past Medical History:  Diagnosis Date  . Allergy   . Arthritis   . Asthma   . Endometriosis   . GERD (gastroesophageal reflux disease)   . History of tetanus, diphtheria, and acellular pertussis booster vaccination (Tdap)    07/30/2010  . Hyperlipidemia   . Kidney stones   . Pancreatitis    Social History   Social  History Narrative   Works as a Education officer, environmental.     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   Adult children live in [place], [place] and [place].   Social History   Tobacco Use  . Smoking status: Never Smoker  . Smokeless tobacco: Never Used  Substance Use Topics  . Alcohol use: No  . Drug use: No   family history includes Heart disease in her daughter and maternal grandfather; Hypertension in her brother, brother, father, maternal grandfather, paternal grandfather, sister, and sister.     Objective:  BP 132/80 (BP Location:  Left Arm, Patient Position: Sitting, Cuff Size: Normal)   Pulse 91   Temp 97.6 F (36.4 C) (Oral)   Resp 18   Ht 5' 5.55" (1.665 m)   Wt 180 lb (81.6 kg)   LMP 06/06/1996   SpO2 100%   BMI 29.45 kg/m  Body mass index is 29.45 kg/m.  Physical Exam  Constitutional: She is oriented to person, place, and time. She appears well-developed and well-nourished.  Several episodes in room where patient starts to fan herself and complains about how hot she is  HENT:  Head: Normocephalic and atraumatic.  Right Ear: Hearing and external ear normal.  Left Ear: Hearing and external ear normal.  Eyes: Conjunctivae are normal.  Neck: Normal range of motion.  Cardiovascular: Normal rate, regular rhythm and normal heart sounds.  No murmur heard. Pulmonary/Chest: Effort normal and breath sounds normal. She has no wheezes.  Neurological: She is alert and oriented to person, place, and time.  Skin: Skin is warm and dry.  Psychiatric: She has a normal mood and affect. Her behavior is normal. Judgment and thought content normal.  Appears frustrated  Vitals reviewed.   Assessment and Plan :  Menopausal vasomotor syndrome - Plan: venlafaxine XR (EFFEXOR XR) 37.5 MG 24 hr capsule -discussed with patient different medications and homeopathic remedies for hot flashes.  She was encouraged to try soy products at home.  We did discuss Effexor that might help her irritability as well as hot flashes.  We also discussed gabapentin which she has been on in the past for nerve pain and she has at home that she may try at night.  She would like to try the Effexor at this time to help with the irritability and hot flashes.  Discussed side effects how to take and what to expect from this medication.  Bacterial vaginosis - Plan: Boric Acid POWD -refilled patient uses as needed weekly  History of stomach ulcers - Plan: pantoprazole (PROTONIX) 40 MG tablet -continues to use daily to help with stomach ulcer  prevention  Environmental allergies - Plan: levocetirizine (XYZAL) 5 MG tablet - Continue use during current allergy season then as needed after that.   Windell Hummingbird PA-C  [facility] 09/16/2017 1:00 PM

## 2017-09-16 NOTE — Patient Instructions (Signed)
     IF you received an x-ray today, you will receive an invoice from [facility]. Please contact [facility] at [phone] with questions or concerns regarding your invoice.   IF you received labwork today, you will receive an invoice from [facility]. Please contact [facility] at [phone] with questions or concerns regarding your invoice.   Our billing staff will not be able to assist you with questions regarding bills from these companies.  You will be contacted with the lab results as soon as they are available. The fastest way to get your results is to activate your My Chart account. Instructions are located on the last page of this paperwork. If you have not heard from us regarding the results in 2 weeks, please contact this office.     

## 2017-09-22 ENCOUNTER — Telehealth: Payer: Self-pay | Admitting: Physician Assistant

## 2017-09-22 NOTE — Telephone Encounter (Signed)
Copied from [place] [id number]. Topic: Quick Communication - See Telephone Encounter >> Sep 22, 2017  8:37 AM Aurelio Brash B wrote: CRM for notification. See Telephone encounter for: 09/22/17. Pt states that the medication she was prescribed  gives her a headache and she stopped taking it  Sunday. venlafaxine XR (EFFEXOR XR) 37.5 MG 24 hr capsule  Pt wants to know if she can get a different rx  [place] ([place]), [place] [phone] (Phone) [phone] (Fax)

## 2017-09-23 NOTE — Telephone Encounter (Signed)
Most of the time the headache will resolve in 7-14 days.  If the patient is willing to try to see if the headache will go away that would be recommended.  If she does not want to try this - suggest she use the gabapentin that she has at home which we discussed at the last visit to see if that helps with the hot flashes.

## 2017-09-24 NOTE — Telephone Encounter (Signed)
Phone call to patient. Relayed message from provider, she verbalizes understanding.

## 2017-10-05 ENCOUNTER — Telehealth: Payer: Self-pay | Admitting: Physician Assistant

## 2017-10-05 NOTE — Telephone Encounter (Signed)
Copied from [place] [phone]. Topic: Quick Communication - See Telephone Encounter >> Oct 05, 2017  2:55 PM Hewitt Shorts wrote: Pt is needing a refill for gabapentin   [facility] [place] [phone]  Best number [phone]

## 2017-10-06 NOTE — Telephone Encounter (Signed)
Patient called and asked about Gabapentin. She says "when I was in the office last, we talked about me taking Gabapentin and I told her I still had some at home. She told me to take up what I had and let her know when I needed more." I advised the medication is not listed on her profile, so I will send this over to Sarah for approval, she verbalized understanding.  Gabapentin new prescription request Last OV:09/16/17 (mention of Gabapentin in notes) NTI:[facility] Pharmacy: [facility] [address] ([address]),  - [place] [phone] (Phone) [phone] (Fax)

## 2017-10-06 NOTE — Telephone Encounter (Signed)
Message sent to Sarah to order Gabapentin

## 2017-10-06 NOTE — Telephone Encounter (Signed)
Attempted to call patient- phone rang and then went to fax.

## 2017-10-08 NOTE — Telephone Encounter (Signed)
Please see message below

## 2017-10-08 NOTE — Telephone Encounter (Signed)
Please find out what dose the patient is taking - she is correct - I told her this but I need to know what dose she is taking - is it helping with her hot flashes?

## 2017-10-08 NOTE — Telephone Encounter (Signed)
Pt states she is taking Gabapentin 300 mg daily at night and it is helping with her hot flashes.

## 2017-10-09 MED ORDER — GABAPENTIN 300 MG PO CAPS: 300.0000 mg | ORAL_CAPSULE | Freq: Three times a day (TID) | ORAL | 0 refills | Status: DC

## 2017-10-21 ENCOUNTER — Encounter: Payer: Self-pay | Admitting: Physician Assistant

## 2017-10-21 ENCOUNTER — Other Ambulatory Visit: Payer: Self-pay

## 2017-10-21 ENCOUNTER — Ambulatory Visit: Payer: 59 | Admitting: Physician Assistant

## 2017-10-21 DIAGNOSIS — N951 Menopausal and female climacteric states: Secondary | ICD-10-CM | POA: Diagnosis not present

## 2017-10-21 MED ORDER — VENLAFAXINE HCL ER 37.5 MG PO CP24: 37.5000 mg | ORAL_CAPSULE | Freq: Every day | ORAL | 1 refills | Status: DC

## 2017-10-21 MED ORDER — GABAPENTIN 300 MG PO CAPS: 300.0000 mg | ORAL_CAPSULE | Freq: Three times a day (TID) | ORAL | 0 refills | Status: DC

## 2017-10-21 NOTE — Patient Instructions (Signed)
     IF you received an x-ray today, you will receive an invoice from [facility]. Please contact [facility] at [phone] with questions or concerns regarding your invoice.   IF you received labwork today, you will receive an invoice from [facility]. Please contact [facility] at [phone] with questions or concerns regarding your invoice.   Our billing staff will not be able to assist you with questions regarding bills from these companies.  You will be contacted with the lab results as soon as they are available. The fastest way to get your results is to activate your My Chart account. Instructions are located on the last page of this paperwork. If you have not heard from us regarding the results in 2 weeks, please contact this office.     

## 2017-10-21 NOTE — Progress Notes (Signed)
Kendra Gonzalez  MRN: [id number] DOB: [birth year]  PCP: Mancel Bale, PA-C  Chief Complaint  Patient presents with  . Medication Refill    hot flashes med check     Subjective:  Kendra Gonzalez is a 47 year old female presenting for evaluation of hot flash medication. She states she is taking gabapentin and venlafaxine and tolerating both well. Hot flashes seemed to resolve.   Has headache today but thinks it is related to allergies. Upon inquiry she notes some vaginal dryness but thought she related to hysterectomy in '98. It does not seem to be bothering her.  History is obtained by patient.  Review of Systems  Constitutional: Negative for chills and fever.  Respiratory: Negative for chest tightness and shortness of breath.   Cardiovascular: Negative for chest pain.  Genitourinary: Negative for difficulty urinating, dysuria and vaginal bleeding.  Neurological: Positive for headaches (allergies).    Patient Active Problem List   Diagnosis Date Noted  . Presence of left artificial hip joint 07/09/2017  . Hyperthyroidism 11/03/2016  . Multinodular goiter 04/17/2015  . History of stomach ulcers 03/22/2015  . Chronic pain of left heel 04/23/2014  . Chronic pain syndrome 03/19/2014  . Asthma 01/04/2013  . Abdominal pain, chronic, right upper quadrant 06/23/2012  . BMI 29.0-29.9,adult 10/12/2011  . 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 Trochanteric bursitis of left hip 01/22/2011  . Hyperlipidemia 02/16/2007  . ALLERGIC RHINITIS 02/16/2007  . GERD 02/16/2007    Current Outpatient Medications on File Prior to Visit  Medication Sig Dispense Refill  . albuterol (PROVENTIL HFA;VENTOLIN HFA) 108 (90 Base) MCG/ACT inhaler Inhale 2 puffs into the lungs every 6 (six) hours as needed for wheezing or shortness of breath. 1 Inhaler 6  . albuterol (PROVENTIL) (2.5 MG/3ML) 0.083% nebulizer solution Take 3 mLs (2.5 mg total) by nebulization every 6 (six) hours as needed for wheezing  or shortness of breath. 150 mL 1  . Boric Acid POWD Place 600 mg vaginally as directed. Place 600 mg capsule intravaginally once a week as needed for irritation: Boric acid suppository 113 g 0  . fluticasone-salmeterol (ADVAIR HFA) 115-21 MCG/ACT inhaler Inhale 2 puffs into the lungs 2 (two) times daily. 1 Inhaler 11  . gabapentin (NEURONTIN) 300 MG capsule Take 1 capsule (300 mg total) by mouth 3 (three) times daily. 90 capsule 0  . levocetirizine (XYZAL) 5 MG tablet Take 1 tablet (5 mg total) by mouth every evening. 90 tablet 4  . magnesium gluconate (MAGONATE) 500 MG tablet Take 500 mg by mouth at bedtime.    . methimazole (TAPAZOLE) 5 MG tablet Take 1 tablet (5 mg total) by mouth daily. 30 tablet 3  . Multiple Vitamins-Minerals (MULTIVITAMIN WITH MINERALS) tablet Take 1 tablet by mouth daily.    . pantoprazole (PROTONIX) 40 MG tablet Take 1 tablet (40 mg total) by mouth daily. 90 tablet 4  . venlafaxine XR (EFFEXOR XR) 37.5 MG 24 hr capsule Take 1 capsule (37.5 mg total) by mouth daily with breakfast. 30 capsule 1  . VOLTAREN 1 % GEL      No current facility-administered medications on file prior to visit.     Allergies  Allergen Reactions  . Hydromorphone Itching    Past Medical History:  Diagnosis Date  . Allergy   . Arthritis   . Asthma   . Endometriosis   . GERD (gastroesophageal reflux disease)   . History of tetanus, diphtheria, and acellular pertussis booster vaccination (Tdap)    07/30/2010  .  Hyperlipidemia   . Kidney stones   . Pancreatitis    Social History   Social History Narrative   Works as a Education officer, environmental.     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   Adult children live in [place], [place] and [place].   Social History   Tobacco Use  . Smoking status: Never Smoker  . Smokeless tobacco: Never Used  Substance Use Topics  . Alcohol use: No  . Drug use: No   family history includes Heart disease in her daughter and maternal grandfather; Hypertension in her brother,  brother, father, maternal grandfather, paternal grandfather, sister, and sister.     Objective:  BP 110/82   Pulse 87   Temp 98.3 F (36.8 C) (Oral)   Resp 18   Ht 5' 5.55" (1.665 m)   Wt 183 lb 12.8 oz (83.4 kg)   LMP 06/06/1996   SpO2 99%   BMI 30.07 kg/m  Body mass index is 30.07 kg/m.  Wt Readings from Last 3 Encounters:  10/21/17 183 lb 12.8 oz (83.4 kg)  09/16/17 180 lb (81.6 kg)  06/16/17 181 lb 6.4 oz (82.3 kg)    Physical Exam  Constitutional: She is oriented to person, place, and time. She appears well-developed and well-nourished.  HENT:  Head: Normocephalic and atraumatic.  Eyes: Pupils are equal, round, and reactive to light. Conjunctivae are normal.  Neck: Normal range of motion. Neck supple. Thyromegaly (h/o nodules ) present.  Cardiovascular: Normal rate, regular rhythm and normal heart sounds.  No murmur heard. Pulmonary/Chest: Effort normal and breath sounds normal.  Lymphadenopathy:    She has no cervical adenopathy.  Neurological: She is alert and oriented to person, place, and time.  Skin: Skin is warm and dry.  Psychiatric: Her behavior is normal. Judgment and thought content normal.  Vitals reviewed.   Assessment and Plan :  1. Menopausal vasomotor syndrome Well controlled; continue current regimen. Contact office if symptoms worsen or new symptoms develop. - gabapentin (NEURONTIN) 300 MG capsule; Take 1 capsule (300 mg total) by mouth 3 (three) times daily.  Dispense: 90 capsule; Refill: 0 - venlafaxine XR (EFFEXOR XR) 37.5 MG 24 hr capsule; Take 1 capsule (37.5 mg total) by mouth daily with breakfast.  Dispense: 90 capsule; Refill: 1   Patient verbalized to me that they understand the following: diagnosis, what is being done for them, what to expect and what should be done at home.  Their questions have been answered.  See after visit summary for patient specific instructions.  Windell Hummingbird PA-C  [facility] 10/21/2017 8:31 AM  Please note: Portions of this report may have been transcribed using dragon voice recognition software. Every effort was made to ensure accuracy; however, inadvertent computerized transcription errors may be present.

## 2017-10-21 NOTE — Progress Notes (Deleted)
Kendra Gonzalez  MRN: [id number] DOB: 11/30/1970  PCP: Mancel Bale, PA-C  Chief Complaint  Patient presents with  . Medication Refill    hot flashes med check     Subjective:  Pt presents to clinic for  History is obtained by ***.  Review of Systems  Patient Active Problem List   Diagnosis Date Noted  . Presence of left artificial hip joint 07/09/2017  . Hyperthyroidism 11/03/2016  . Multinodular goiter 04/17/2015  . History of stomach ulcers 03/22/2015  . Chronic pain of left heel 04/23/2014  . Chronic pain syndrome 03/19/2014  . Asthma 01/04/2013  . Abdominal pain, chronic, right upper quadrant 06/23/2012  . BMI 29.0-29.9,adult 10/12/2011  . 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 Trochanteric bursitis of left hip 01/22/2011  . Hyperlipidemia 02/16/2007  . ALLERGIC RHINITIS 02/16/2007  . GERD 02/16/2007    Current Outpatient Medications on File Prior to Visit  Medication Sig Dispense Refill  . albuterol (PROVENTIL HFA;VENTOLIN HFA) 108 (90 Base) MCG/ACT inhaler Inhale 2 puffs into the lungs every 6 (six) hours as needed for wheezing or shortness of breath. 1 Inhaler 6  . albuterol (PROVENTIL) (2.5 MG/3ML) 0.083% nebulizer solution Take 3 mLs (2.5 mg total) by nebulization every 6 (six) hours as needed for wheezing or shortness of breath. 150 mL 1  . Boric Acid POWD Place 600 mg vaginally as directed. Place 600 mg capsule intravaginally once a week as needed for irritation: Boric acid suppository 113 g 0  . fluticasone-salmeterol (ADVAIR HFA) 115-21 MCG/ACT inhaler Inhale 2 puffs into the lungs 2 (two) times daily. 1 Inhaler 11  . gabapentin (NEURONTIN) 300 MG capsule Take 1 capsule (300 mg total) by mouth 3 (three) times daily. 90 capsule 0  . levocetirizine (XYZAL) 5 MG tablet Take 1 tablet (5 mg total) by mouth every evening. 90 tablet 4  . magnesium gluconate (MAGONATE) 500 MG tablet Take 500 mg by mouth at bedtime.    . methimazole (TAPAZOLE) 5 MG tablet Take 1  tablet (5 mg total) by mouth daily. 30 tablet 3  . Multiple Vitamins-Minerals (MULTIVITAMIN WITH MINERALS) tablet Take 1 tablet by mouth daily.    . pantoprazole (PROTONIX) 40 MG tablet Take 1 tablet (40 mg total) by mouth daily. 90 tablet 4  . venlafaxine XR (EFFEXOR XR) 37.5 MG 24 hr capsule Take 1 capsule (37.5 mg total) by mouth daily with breakfast. 30 capsule 1  . VOLTAREN 1 % GEL      No current facility-administered medications on file prior to visit.     Allergies  Allergen Reactions  . Hydromorphone Itching    Past Medical History:  Diagnosis Date  . Allergy   . Arthritis   . Asthma   . Endometriosis   . GERD (gastroesophageal reflux disease)   . History of tetanus, diphtheria, and acellular pertussis booster vaccination (Tdap)    07/30/2010  . Hyperlipidemia   . Kidney stones   . Pancreatitis    Social History   Social History Narrative   Works as a Education officer, environmental.     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   Adult children live in [place], [place] and [place].   Social History   Tobacco Use  . Smoking status: Never Smoker  . Smokeless tobacco: Never Used  Substance Use Topics  . Alcohol use: No  . Drug use: No   family history includes Heart disease in her daughter and maternal grandfather; Hypertension in her brother, brother, father, maternal  grandfather, paternal grandfather, sister, and sister.     Objective:  BP 110/82   Pulse 87   Temp 98.3 F (36.8 C) (Oral)   Resp 18   Ht 5' 5.55" (1.665 m)   Wt 183 lb 12.8 oz (83.4 kg)   LMP 06/06/1996   SpO2 99%   BMI 30.07 kg/m  Body mass index is 30.07 kg/m.  Wt Readings from Last 3 Encounters:  10/21/17 183 lb 12.8 oz (83.4 kg)  09/16/17 180 lb (81.6 kg)  06/16/17 181 lb 6.4 oz (82.3 kg)    Physical Exam  Assessment and Plan :  No diagnosis found.  Patient verbalized to me that they understand the following: diagnosis, what is being done for them, what to expect and what should be done at home.  Their  questions have been answered.  See after visit summary for patient specific instructions.  Windell Hummingbird PA-C  [facility] 10/21/2017 8:32 AM  Please note: Portions of this report may have been transcribed using dragon voice recognition software. Every effort was made to ensure accuracy; however, inadvertent computerized transcription errors may be present.

## 2017-11-09 ENCOUNTER — Ambulatory Visit: Payer: 59 | Admitting: Physician Assistant

## 2017-11-25 ENCOUNTER — Encounter: Payer: Self-pay | Admitting: Physician Assistant

## 2017-11-25 ENCOUNTER — Other Ambulatory Visit: Payer: Self-pay

## 2017-11-25 ENCOUNTER — Ambulatory Visit: Payer: Self-pay | Admitting: *Deleted

## 2017-11-25 ENCOUNTER — Ambulatory Visit: Payer: 59 | Admitting: Physician Assistant

## 2017-11-25 VITALS — BP 124/72 | HR 75 | Temp 99.0°F | Resp 20 | Ht 65.24 in | Wt 186.4 lb

## 2017-11-25 DIAGNOSIS — R319 Hematuria, unspecified: Secondary | ICD-10-CM

## 2017-11-25 LAB — POCT URINALYSIS DIP (MANUAL ENTRY)
Bilirubin, UA: NEGATIVE
GLUCOSE UA: NEGATIVE mg/dL
Leukocytes, UA: NEGATIVE
Nitrite, UA: NEGATIVE
SPEC GRAV UA: 1.02 (ref 1.010–1.025)
Urobilinogen, UA: 0.2 E.U./dL
pH, UA: 7 (ref 5.0–8.0)

## 2017-11-25 LAB — POC MICROSCOPIC URINALYSIS (UMFC): MUCUS RE: ABSENT

## 2017-11-25 NOTE — Telephone Encounter (Signed)
Pt called to let her provider know that she went to the bathroom when she left the office and did have a very small clot in the commode. Still denies pain or burning with urination. She has blood in the commode every time she urinates. LMP was in 1998. Will call back for increase in symptoms. Will route to flow at [facility].   Reason for Disposition . Blood in urine  (Exception: could be normal menstrual bleeding)  Answer Assessment - Initial Assessment Questions 1. COLOR of URINE: "Describe the color of the urine."  (e.g., tea-colored, pink, red, blood clots, bloody)     Small clot in toilet 2. ONSET: "When did the bleeding start?"      This morning 3. EPISODES: "How many times has there been blood in the urine?" or "How many times today?"     Every time she uses the bathroom 4. PAIN with URINATION: "Is there any pain with passing your urine?" If so, ask: "How bad is the pain?"  (Scale 1-10; or mild, moderate, severe)    - MILD - complains slightly about urination hurting    - MODERATE - interferes with normal activities      - SEVERE - excruciating, unwilling or unable to urinate because of the pain      No  5. FEVER: "Do you have a fever?" If so, ask: "What is your temperature, how was it measured, and when did it start?"     no 6. ASSOCIATED SYMPTOMS: "Are you passing urine more frequently than usual?"     no 7. OTHER SYMPTOMS: "Do you have any other symptoms?" (e.g., back/flank pain, abdominal pain, vomiting)     no 8. PREGNANCY: "Is there any chance you are pregnant?" "When was your last menstrual period?"     No , LMP 1998  Protocols used: URINE - BLOOD IN-A-AH

## 2017-11-25 NOTE — Progress Notes (Signed)
11/25/2017 at [place] / DOB: 07-Oct-1970 / MRN: [id number]  The patient has Hyperlipidemia; ALLERGIC RHINITIS; GERD; BMI 29.0-29.9,adult; Abdominal pain, chronic, right upper quadrant; History of stomach ulcers; Multinodular goiter; Asthma; Chronic pain of left heel; Chronic pain syndrome; Paroxysmal nerve pain; Trochanteric bursitis of left hip; Hyperthyroidism; and Presence of left artificial hip joint on their problem list.  SUBJECTIVE  Kendra Gonzalez is a 47 y.o. female who complains of hematuria x 1 day. Noticed this morning when she wiped. No visible clots seen.  She denies dysuria, urinary frequency, urinary urgency, flank pain, abdominal pain, pelvic pain, cloudy malordorous urine, genital rash, genital irritation, vaginal discharge and muscle aches.  She is sexually active with monogamous partner. Denies smoking. Has no consumed any beets.  Denies recent travel. No PMH of renal disease.  PSH of hysterectomy.   She  has a past medical history of Allergy, Arthritis, Asthma, Endometriosis, GERD (gastroesophageal reflux disease), History of tetanus, diphtheria, and acellular pertussis booster vaccination (Tdap), Hyperlipidemia, Kidney stones, and Pancreatitis.    Medications reviewed and updated by myself where necessary, and exist elsewhere in the encounter.   Kendra Gonzalez is allergic to hydromorphone. She  reports that she has never smoked. She has never used smokeless tobacco. She reports that she does not drink alcohol or use drugs. She  reports that she currently engages in sexual activity. She reports using the following method of birth control/protection: None. The patient  has a past surgical history that includes Cholecystectomy; Abdominal hysterectomy; oophrectomy (2011); carpel tunnel; arthroscopic knee surgery ; Hip surgery; ERCP (01/08/2012); and Tubal ligation.  Her family history includes Heart disease in her daughter and maternal grandfather; Hypertension in  her brother, brother, father, maternal grandfather, paternal grandfather, sister, and sister.  Review of Systems  Constitutional: Negative for chills, diaphoresis, fever and malaise/fatigue.  Gastrointestinal: Negative for abdominal pain, blood in stool, nausea and vomiting.  Neurological: Negative for dizziness and headaches.    OBJECTIVE  Her  height is 5' 5.24" (1.657 m) and weight is 186 lb 6.4 oz (84.6 kg). Her oral temperature is 99 F (37.2 C). Her blood pressure is 124/72 and her pulse is 75. Her respiration is 20 and oxygen saturation is 100%.  The patient's body mass index is 30.79 kg/m.  Physical Exam  Constitutional: She is oriented to person, place, and time. She appears well-developed and well-nourished. No distress.  HENT:  Head: Normocephalic and atraumatic.  Eyes: Conjunctivae are normal.  Neck: Normal range of motion.  Pulmonary/Chest: Effort normal.  Abdominal: Soft. Normal appearance. There is no tenderness. There is no CVA tenderness.  Genitourinary: Vagina normal. There is no rash, tenderness, lesion or injury on the right labia. There is no rash, tenderness, lesion or injury on the left labia. No vaginal discharge found.  Genitourinary Comments: Chaperone present for GU exam.   Neurological: She is alert and oriented to person, place, and time.  Skin: Skin is warm and dry.  Psychiatric: She has a normal mood and affect.  Vitals reviewed.   Results for orders placed or performed in visit on 11/25/17 (from the past 24 hour(s))  POCT urinalysis dipstick     Status: Abnormal   Collection Time: 11/25/17 12:33 PM  Result Value Ref Range   Color, UA yellow yellow   Clarity, UA cloudy (A) clear   Glucose, UA negative negative mg/dL   Bilirubin, UA negative negative   Ketones, POC UA trace (5) (A) negative mg/dL  Spec Grav, UA 1.020 1.010 - 1.025   Blood, UA large (A) negative   pH, UA 7.0 5.0 - 8.0   Protein Ur, POC trace (A) negative mg/dL   Urobilinogen,  UA 0.2 0.2 or 1.0 E.U./dL   Nitrite, UA Negative Negative   Leukocytes, UA Negative Negative  POCT Microscopic Urinalysis ([facility])     Status: Abnormal   Collection Time: 11/25/17  1:43 PM  Result Value Ref Range   WBC,UR,HPF,POC Too numerous to count  (A) None WBC/hpf   RBC,UR,HPF,POC Too numerous to count  (A) None RBC/hpf   Bacteria Few (A) None, Too numerous to count   Mucus Absent Absent   Epithelial Cells, UR Per Microscopy Few (A) None, Too numerous to count cells/hpf    ASSESSMENT & PLAN  Kendra Gonzalez was seen today for hematuria.  Diagnoses and all orders for this visit:  Hematuria, unspecified type -     POCT urinalysis dipstick -     POCT Microscopic Urinalysis ([facility]) -     Urine Culture    Pt with painless hematuria. There is no gross hematuria noted with visualization of urine today. Microscopic hematuria noted. Micro also + for WBC and bacteria.  Pt is completely asx. Will send urine cx at this time in case presenting atypically for UTI. Do not suspect kidney stone due to pt being asx.  No apparent injury or site of bleeding noted on GU exam. Given strict education on signs of UTI and kidney stones. Otherwise, will plan for pt to increase water consumption and follow up one week to see if this is transient or persistent. If persistent, will refer to urology. The patient was advised to call or come back to clinic if she does not see an improvement in symptoms, or worsens with the above plan.   Tenna Delaine, PA-C  [facility] 11/25/2017 1:57 PM

## 2017-11-25 NOTE — Patient Instructions (Addendum)
We will contact you with your results. In the meantime, drink lots of water. Monitor for any symptoms like urinary frequency, burning with urination, flank pain, nausea, and vomiting, if you develop any of these seek care immediately.  Otherwise return in one week for repeat urinalysis.   Thank you for letting me participate in your health and well being.   Hematuria, Adult Hematuria is blood in your urine. It can be caused by a bladder infection, kidney infection, prostate infection, kidney stone, or cancer of your urinary tract. Infections can usually be treated with medicine, and a kidney stone usually will pass through your urine. If neither of these is the cause of your hematuria, further workup to find out the reason may be needed. It is very important that you tell your health care provider about any blood you see in your urine, even if the blood stops without treatment or happens without causing pain. Blood in your urine that happens and then stops and then happens again can be a symptom of a very serious condition. Also, pain is not a symptom in the initial stages of many urinary cancers. Follow these instructions at home:  Drink lots of fluid, 3-4 quarts a day. If you have been diagnosed with an infection, cranberry juice is especially recommended, in addition to large amounts of water.  Avoid caffeine, tea, and carbonated beverages because they tend to irritate the bladder.  Avoid alcohol because it may irritate the prostate.  Take all medicines as directed by your health care provider.  If you were prescribed an antibiotic medicine, finish it all even if you start to feel better.  If you have been diagnosed with a kidney stone, follow your health care provider's instructions regarding straining your urine to catch the stone.  Empty your bladder often. Avoid holding urine for long periods of time.  After a bowel movement, women should cleanse front to back. Use each tissue only  once.  Empty your bladder before and after sexual intercourse if you are a female. Contact a health care provider if:  You develop back pain.  You have a fever.  You have a feeling of sickness in your stomach (nausea) or vomiting.  Your symptoms are not better in 3 days. Return sooner if you are getting worse. Get help right away if:  You develop severe vomiting and are unable to keep the medicine down.  You develop severe back or abdominal pain despite taking your medicines.  You begin passing a large amount of blood or clots in your urine.  You feel extremely weak or faint, or you pass out. This information is not intended to replace advice given to you by your health care provider. Make sure you discuss any questions you have with your health care provider. Document Released: 04/13/2005 Document Revised: 09/19/2015 Document Reviewed: 12/12/2012 Elsevier Interactive Patient Education  2017 [employer].   IF you received an x-ray today, you will receive an invoice from [facility]. Please contact [facility] at [phone] with questions or concerns regarding your invoice.   IF you received labwork today, you will receive an invoice from [facility]. Please contact [facility] at [phone] with questions or concerns regarding your invoice.   Our billing staff will not be able to assist you with questions regarding bills from these companies.  You will be contacted with the lab results as soon as they are available. The fastest way to get your results is to activate your My Chart account. Instructions are  located on the last page of this paperwork. If you have not heard from [place] regarding the results in 2 weeks, please contact this office.

## 2017-11-25 NOTE — Telephone Encounter (Signed)
Pt was seen in the office today by [place]  With concerns

## 2017-11-26 LAB — URINE CULTURE: ORGANISM ID, BACTERIA: NO GROWTH

## 2017-11-29 NOTE — Progress Notes (Signed)
I called pt and stated that her urine culture was neg for any bacteria. Pt states understands.  Pt states that she only seen blood that day. Haven't seen any more.

## 2018-03-12 ENCOUNTER — Encounter (HOSPITAL_COMMUNITY): Payer: Self-pay

## 2018-03-12 ENCOUNTER — Other Ambulatory Visit: Payer: Self-pay

## 2018-03-12 ENCOUNTER — Ambulatory Visit (HOSPITAL_COMMUNITY)
Admission: EM | Admit: 2018-03-12 | Discharge: 2018-03-12 | Disposition: A | Discharge status: Home / Self Care | Payer: 59 | Attending: Radiology | Admitting: Radiology

## 2018-03-12 DIAGNOSIS — R059 Cough, unspecified: Secondary | ICD-10-CM

## 2018-03-12 DIAGNOSIS — R05 Cough: Secondary | ICD-10-CM

## 2018-03-12 MED ORDER — BENZONATATE 100 MG PO CAPS: 100.0000 mg | ORAL_CAPSULE | Freq: Three times a day (TID) | ORAL | 0 refills | Status: DC

## 2018-03-12 MED ORDER — DOXYCYCLINE HYCLATE 100 MG PO CAPS: 100.0000 mg | ORAL_CAPSULE | Freq: Two times a day (BID) | ORAL | 0 refills | Status: DC

## 2018-03-12 MED ORDER — PREDNISONE 10 MG PO TABS: 40.0000 mg | ORAL_TABLET | Freq: Every day | ORAL | 0 refills | Status: AC

## 2018-03-12 NOTE — ED Provider Notes (Signed)
[facility]    CSN: [id number] Arrival date & time: 03/12/18  3299     History   Chief Complaint Chief Complaint  Patient presents with  . Cough    HPI Kendra Gonzalez is a 47 y.o. female.   47 y.o. female presents with x1 week.  Patient endorses substernal chest pain that she states is worse with coughing.  And right upper lobe chest pain that she describes is persistent in nature.  Patient denies any fevers nausea vomiting or signs of upper respiratory infection.  condition is acute in nature. Condition is made better by nothing. Condition is made worse by nothing. Patient denies any relief from over-the-counter Mucinex taken prior to there arrival at this facility.       Past Medical History:  Diagnosis Date  . Allergy   . Arthritis   . Asthma   . Endometriosis   . GERD (gastroesophageal reflux disease)   . History of tetanus, diphtheria, and acellular pertussis booster vaccination (Tdap)    07/30/2010  . Hyperlipidemia   . Kidney stones   . Pancreatitis     Patient Active Problem List   Diagnosis Date Noted  . Presence of left artificial hip joint 07/09/2017  . Hyperthyroidism 11/03/2016  . Multinodular goiter 04/17/2015  . History of stomach ulcers 03/22/2015  . Chronic pain of left heel 04/23/2014  . Chronic pain syndrome 03/19/2014  . Asthma 01/04/2013  . Abdominal pain, chronic, right upper quadrant 06/23/2012  . BMI 29.0-29.9,adult 10/12/2011  . 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 Trochanteric bursitis of left hip 01/22/2011  . Hyperlipidemia 02/16/2007  . ALLERGIC RHINITIS 02/16/2007  . GERD 02/16/2007    Past Surgical History:  Procedure Laterality Date  . ABDOMINAL HYSTERECTOMY    . arthroscopic knee surgery     . carpel tunnel    . CHOLECYSTECTOMY    . ERCP  01/08/2012   Procedure: ENDOSCOPIC RETROGRADE CHOLANGIOPANCREATOGRAPHY (ERCP);  Surgeon: Beryle Beams, MD;  Location[facility] ENDOSCOPY;  Service: Endoscopy;  Laterality: N/A;    . HIP SURGERY    . oophrectomy  2011   secondary to cysts  . TUBAL LIGATION      OB History   None      Home Medications    Prior to Admission medications   Medication Sig Start Date End Date Taking? Authorizing Provider  albuterol (PROVENTIL HFA;VENTOLIN HFA) 108 (90 Base) MCG/ACT inhaler Inhale 2 puffs into the lungs every 6 (six) hours as needed for wheezing or shortness of breath. 04/02/17   Harrison Mons, PA  albuterol (PROVENTIL) (2.5 MG/3ML) 0.083% nebulizer solution Take 3 mLs (2.5 mg total) by nebulization every 6 (six) hours as needed for wheezing or shortness of breath. 04/10/17   Jaynee Eagles, PA-C  Boric Acid POWD Place 600 mg vaginally as directed. Place 600 mg capsule intravaginally once a week as needed for irritation: Boric acid suppository 09/16/17   Weber, Damaris Hippo, PA-C  fluticasone-salmeterol (ADVAIR HFA) 115-21 MCG/ACT inhaler Inhale 2 puffs into the lungs 2 (two) times daily. 04/02/17   Harrison Mons, PA  gabapentin (NEURONTIN) 300 MG capsule Take 1 capsule (300 mg total) by mouth 3 (three) times daily. 10/21/17   Gale Journey, Damaris Hippo, PA-C  levocetirizine (XYZAL) 5 MG tablet Take 1 tablet (5 mg total) by mouth every evening. 09/16/17   Gale Journey, Damaris Hippo, PA-C  magnesium gluconate (MAGONATE) 500 MG tablet Take 500 mg by mouth at bedtime.    [provider]  methimazole (  TAPAZOLE) 5 MG tablet Take 1 tablet (5 mg total) by mouth daily. 11/03/16   Renato Shin, MD  Multiple Vitamins-Minerals (MULTIVITAMIN WITH MINERALS) tablet Take 1 tablet by mouth daily.    [provider]  pantoprazole (PROTONIX) 40 MG tablet Take 1 tablet (40 mg total) by mouth daily. 09/16/17   Gale Journey, Damaris Hippo, PA-C  venlafaxine XR (EFFEXOR XR) 37.5 MG 24 hr capsule Take 1 capsule (37.5 mg total) by mouth daily with breakfast. 10/21/17   Gale Journey, Damaris Hippo, PA-C  VOLTAREN 1 % GEL  09/13/15   [provider]    Family History Family History  Problem Relation Age of Onset  . Heart  disease Daughter   . Hypertension Father   . Hypertension Sister   . Hypertension Brother   . Hypertension Maternal Grandfather   . Heart disease Maternal Grandfather   . Hypertension Paternal Grandfather   . Hypertension Brother   . Hypertension Sister   . Thyroid disease Neg Hx     Social History Social History   Tobacco Use  . Smoking status: Never Smoker  . Smokeless tobacco: Never Used  Substance Use Topics  . Alcohol use: No  . Drug use: No     Allergies   Hydromorphone   Review of Systems Review of Systems  Constitutional: Negative for chills and fever.  HENT: Negative for ear pain and sore throat.   Eyes: Negative for pain and visual disturbance.  Respiratory: Positive for cough. Negative for shortness of breath.   Cardiovascular: Negative for chest pain and palpitations.  Gastrointestinal: Negative for abdominal pain and vomiting.  Genitourinary: Negative for dysuria and hematuria.  Musculoskeletal: Negative for arthralgias and back pain.  Skin: Negative for color change and rash.  Neurological: Negative for seizures and syncope.  All other systems reviewed and are negative.    Physical Exam Triage Vital Signs ED Triage Vitals  Enc Vitals Group     BP 03/12/18 1010 (!) 150/95     Pulse --      Resp 03/12/18 1010 16     Temp 03/12/18 1010 98.1 F (36.7 C)     Temp Source 03/12/18 1010 Oral     SpO2 03/12/18 1010 100 %     Weight 03/12/18 1012 188 lb (85.3 kg)     Height --      Head Circumference --      Peak Flow --      Pain Score 03/12/18 1011 4     Pain Loc --      Pain Edu? --      Excl. in [place]? --    No data found.  Updated Vital Signs BP (!) 150/95 (BP Location: Left Arm)   Temp 98.1 F (36.7 C) (Oral)   Resp 16   Wt 188 lb (85.3 kg)   LMP 06/06/1996   SpO2 100%   BMI 31.06 kg/m   Visual Acuity Right Eye Distance:   Left Eye Distance:   Bilateral Distance:    Right Eye Near:   Left Eye Near:    Bilateral Near:      Physical Exam  Constitutional: She is oriented to person, place, and time. She appears well-developed and well-nourished.  HENT:  Head: Normocephalic and atraumatic.  Eyes: Conjunctivae are normal.  Neck: Normal range of motion.  Cardiovascular: Normal rate and regular rhythm.  Pulmonary/Chest: Effort normal.  Decreased breath sounds right apex and left lower lobe.  Musculoskeletal: Normal range of motion.  Neurological: She  is alert and oriented to person, place, and time.  Skin: Skin is warm.  Psychiatric: She has a normal mood and affect.  Nursing note and vitals reviewed.    UC Treatments / Results  Labs (all labs ordered are listed, but only abnormal results are displayed) Labs Reviewed - No data to display  EKG None  Radiology No results found.  Procedures Procedures (including critical care time)  Medications Ordered in UC Medications - No data to display  Initial Impression / Assessment and Plan / UC Course  I have reviewed the triage vital signs and the nursing notes.  Pertinent labs & imaging results that were available during my care of the patient were reviewed by me and considered in my medical decision making (see chart for details).      Final Clinical Impressions(s) / UC Diagnoses   Final diagnoses:  None   Discharge Instructions   None    ED Prescriptions    None     Controlled Substance Prescriptions [place] Controlled Substance Registry consulted? Not Applicable   Jacqualine Mau, NP 03/12/18 1024

## 2018-03-12 NOTE — ED Notes (Signed)
Pt discharged by provider.

## 2018-03-12 NOTE — ED Triage Notes (Signed)
Pt states she has dry cough and chest discomfort x 1 week.

## 2018-03-12 NOTE — Discharge Instructions (Addendum)
Continue to push fluids and take over the counter medications as directed on the back of the box for symptomatic relief.  ° °

## 2018-03-31 DIAGNOSIS — M7062 Trochanteric bursitis, left hip: Secondary | ICD-10-CM | POA: Diagnosis not present

## 2018-03-31 DIAGNOSIS — Z96642 Presence of left artificial hip joint: Secondary | ICD-10-CM | POA: Diagnosis not present

## 2018-03-31 DIAGNOSIS — M25552 Pain in left hip: Secondary | ICD-10-CM | POA: Diagnosis not present

## 2018-03-31 DIAGNOSIS — Z471 Aftercare following joint replacement surgery: Secondary | ICD-10-CM | POA: Diagnosis not present

## 2018-05-30 ENCOUNTER — Ambulatory Visit (INDEPENDENT_AMBULATORY_CARE_PROVIDER_SITE_OTHER): Payer: 59

## 2018-05-30 ENCOUNTER — Ambulatory Visit: Payer: 59 | Admitting: Emergency Medicine

## 2018-05-30 ENCOUNTER — Encounter: Payer: Self-pay | Admitting: Emergency Medicine

## 2018-05-30 VITALS — BP 132/82 | HR 70 | Temp 98.0°F | Resp 16 | Ht 65.0 in | Wt 188.0 lb

## 2018-05-30 DIAGNOSIS — M65252 Calcific tendinitis, left thigh: Secondary | ICD-10-CM

## 2018-05-30 DIAGNOSIS — M25552 Pain in left hip: Secondary | ICD-10-CM

## 2018-05-30 MED ORDER — HYDROCODONE-ACETAMINOPHEN 5-325 MG PO TABS: 1.0000 | ORAL_TABLET | Freq: Four times a day (QID) | ORAL | 0 refills | Status: DC | PRN

## 2018-05-30 NOTE — Patient Instructions (Addendum)
If you have lab work done today you will be contacted with your lab results within the next 2 weeks.  If you have not heard from [place] then please contact us. The fastest way to get your results is to register for My Chart.   IF you received an x-ray today, you will receive an invoice from [facility]. Please contact [facility] at [phone] with questions or concerns regarding your invoice.   IF you received labwork today, you will receive an invoice from [facility]. Please contact [facility] at [phone] with questions or concerns regarding your invoice.   Our billing staff will not be able to assist you with questions regarding bills from these companies.  You will be contacted with the lab results as soon as they are available. The fastest way to get your results is to activate your My Chart account. Instructions are located on the last page of this paperwork. If you have not heard from [place] regarding the results in 2 weeks, please contact this office.     Calcific Tendinitis Calcific tendinitis occurs when crystals of calcium are deposited in a tendon. Tendons are tough, cord-like tissues that connect muscle to bone. Tendons are an important part of joints. They make joints move, and they absorb some of the stress that a joint receives during use. When calcium is deposited in the tendon, the tendon becomes stiff and painful and it can become swollen. Calcific tendinitis occurs frequently in a tendon in the shoulder joint (rotator cuff). What are the causes? The cause of calcific tendinitis is not known. It may be associated with:  Overusing a tendon, such as from repetitive motion.  Excess stress on the tendon.  Age-related wear and tear.  Repetitive, mild injuries. What increases the risk? This condition is more likely to develop in:  People who do activities that involve repetitive motions.  Older people. What are the signs or symptoms? This condition may  or may not be painful. If there is pain, it may occur when moving the joint. Other symptoms may include:  Tenderness when pressure is applied to the tendon.  A snapping or popping sound when the joint moves.  Decreased motion of the joint.  Difficulty sleeping due to pain in the joint. How is this diagnosed? This condition is diagnosed with a physical exam. You may also have tests, such as:  X-rays.  MRI.  CT scan. How is this treated? This condition generally gets better on its own. Treatment may also include:  Resting, icing, applying pressure (compression), and raising (elevating) the area above the level of your heart. This is known as RICE therapy.  Medicines to help reduce inflammation or to help reduce pain.  Physical therapy to strengthen and stretch the tendon.  Following a specific exercise program to keep the joint working properly. Treatment for more severe calcific tendinitis may require:  Injecting steroids or pain-relieving medicines into or around the joint.  Manipulating the joint after you are given medicine to numb the area (local anesthetic).  Inflating the joint with sterile fluid to increase the flexibility of the tendons.  Shock wave therapy, which involves focusing sound waves on the joint. If other treatments do not work, surgery may be done to clean out the calcium deposits and repair the tendon as needed. Most people do not need surgery. Follow these instructions at home: Managing pain, stiffness, and swelling      If directed, put heat on the affected area before you  exercise or as often as told by your health care provider. Use the heat source that your health care provider recommends, such as a moist heat pack or a heating pad. ? Place a towel between your skin and the heat source. ? Leave the heat on for 20-30 minutes. ? Remove the heat if your skin turns bright red. This is especially important if you are unable to feel pain, heat, or  cold. You may have a greater risk of getting burned.  Move the fingers or toes of the affected limb often, if this applies. This can help to prevent stiffness and lessen swelling.  If directed, elevate the affected area above the level of your heart while you are sitting or lying down.  If directed, put ice on the affected area: ? Put ice in a plastic bag. ? Place a towel between your skin and the bag. ? Leave the ice on for 20 minutes, 2-3 times a day. General instructions  Take over-the-counter and prescription medicines only as told by your health care provider.  Do not drive or use heavy machinery while taking prescription pain medicine.  Follow recommendations from your health care provider for activity and exercise. Ask your health care provider what activities are safe for you.  Avoid using the affected area while you are experiencing symptoms of tendinitis.  Wear an elastic bandage or compression wrap only as told by your health care provider.  Keep all follow-up visits as told by your health care provider. This is important. Contact a health care provider if:  You have pain or numbness that gets worse.  You develop new weakness.  You notice increased joint stiffness or a sensation of looseness in the joint.  You notice increasing redness, swelling, or warmth around the joint area. Get help right away if:  You have a fever for more than 2-3 days.  You have symptoms for more than 2-3 days.  You have a fever and your symptoms suddenly get worse. This information is not intended to replace advice given to you by your health care provider. Make sure you discuss any questions you have with your health care provider. Document Released: 01/21/2008 Document Revised: 01/06/2016 Document Reviewed: 01/06/2016 Elsevier Interactive Patient Education  [employer].

## 2018-05-30 NOTE — Progress Notes (Signed)
Ronan M Haslem 48 y.o.   Chief Complaint  Patient presents with  . Hip Pain     left side, pt had hip surgery lasr yr. pt would like a handicapp placard    HISTORY OF PRESENT ILLNESS: This is a 48 y.o. female complaining of pain to her left hip.  Status post total left hip replacement 1 year ago.  Denies new trauma to the area.  No neurological symptoms of the leg.  Constant sharp severe pain worse with movement, best with rest.  HPI   Prior to Admission medications   Medication Sig Start Date End Date Taking? Authorizing Provider  albuterol (PROVENTIL HFA;VENTOLIN HFA) 108 (90 Base) MCG/ACT inhaler Inhale 2 puffs into the lungs every 6 (six) hours as needed for wheezing or shortness of breath. 04/02/17  Yes Jeffery, Chelle, PA  albuterol (PROVENTIL) (2.5 MG/3ML) 0.083% nebulizer solution Take 3 mLs (2.5 mg total) by nebulization every 6 (six) hours as needed for wheezing or shortness of breath. 04/10/17  Yes Jaynee Eagles, PA-C  Boric Acid POWD Place 600 mg vaginally as directed. Place 600 mg capsule intravaginally once a week as needed for irritation: Boric acid suppository 09/16/17  Yes Weber, Sarah L, PA-C  fluticasone-salmeterol (ADVAIR HFA) 115-21 MCG/ACT inhaler Inhale 2 puffs into the lungs 2 (two) times daily. 04/02/17  Yes Jeffery, Chelle, PA  gabapentin (NEURONTIN) 300 MG capsule Take 1 capsule (300 mg total) by mouth 3 (three) times daily. 10/21/17  Yes Weber, Damaris Hippo, PA-C  levocetirizine (XYZAL) 5 MG tablet Take 1 tablet (5 mg total) by mouth every evening. 09/16/17  Yes Weber, Sarah L, PA-C  magnesium gluconate (MAGONATE) 500 MG tablet Take 500 mg by mouth at bedtime.   Yes [provider]  Multiple Vitamins-Minerals (MULTIVITAMIN WITH MINERALS) tablet Take 1 tablet by mouth daily.   Yes [provider]  pantoprazole (PROTONIX) 40 MG tablet Take 1 tablet (40 mg total) by mouth daily. 09/16/17  Yes Weber, Damaris Hippo, PA-C  venlafaxine XR (EFFEXOR XR) 37.5 MG 24 hr  capsule Take 1 capsule (37.5 mg total) by mouth daily with breakfast. 10/21/17  Yes Weber, Damaris Hippo, PA-C  VOLTAREN 1 % GEL  09/13/15  Yes [provider]    Allergies  Allergen Reactions  . Hydromorphone Itching    Patient Active Problem List   Diagnosis Date Noted  . Presence of left artificial hip joint 07/09/2017  . Hyperthyroidism 11/03/2016  . Multinodular goiter 04/17/2015  . History of stomach ulcers 03/22/2015  . Chronic pain of left heel 04/23/2014  . Chronic pain syndrome 03/19/2014  . Asthma 01/04/2013  . Abdominal pain, chronic, right upper quadrant 06/23/2012  . BMI 29.0-29.9,adult 10/12/2011  . 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 Trochanteric bursitis of left hip 01/22/2011  . Hyperlipidemia 02/16/2007  . ALLERGIC RHINITIS 02/16/2007  . GERD 02/16/2007    Past Medical History:  Diagnosis Date  . Allergy   . Arthritis   . Asthma   . Endometriosis   . GERD (gastroesophageal reflux disease)   . History of tetanus, diphtheria, and acellular pertussis booster vaccination (Tdap)    07/30/2010  . Hyperlipidemia   . Kidney stones   . Pancreatitis     Past Surgical History:  Procedure Laterality Date  . ABDOMINAL HYSTERECTOMY    . arthroscopic knee surgery     . carpel tunnel    . CHOLECYSTECTOMY    . ERCP  01/08/2012   Procedure: ENDOSCOPIC RETROGRADE CHOLANGIOPANCREATOGRAPHY (ERCP);  Surgeon: Saralyn Pilar  Renee Ramus, MD;  Location: [facility];  Service: Endoscopy;  Laterality: N/A;  . HIP SURGERY    . oophrectomy  2011   secondary to cysts  . TUBAL LIGATION      Social History   Socioeconomic History  . Marital status: Married    Spouse name: Fritz Pickerel  . Number of children: 2  . Years of education: Not on file  . Highest education level: Not on file  Occupational History  . Occupation: Nursing Aid    Employer: [employer]  Social Needs  . Financial resource strain: Not on file  . Food insecurity:    Worry: Not on file    Inability: Not on file    . Transportation needs:    Medical: Not on file    Non-medical: Not on file  Tobacco Use  . Smoking status: Never Smoker  . Smokeless tobacco: Never Used  Substance and Sexual Activity  . Alcohol use: No  . Drug use: No  . Sexual activity: Yes    Birth control/protection: None  Lifestyle  . Physical activity:    Days per week: 5 days    Minutes per session: 150+ min  . Stress: Not on file  Relationships  . Social connections:    Talks on phone: Not on file    Gets together: Not on file    Attends religious service: Not on file    Active member of club or organization: Not on file    Attends meetings of clubs or organizations: Not on file    Relationship status: Not on file  . Intimate partner violence:    Fear of current or ex partner: Not on file    Emotionally abused: Not on file    Physically abused: Not on file    Forced sexual activity: Not on file  Other Topics Concern  . Not on file  Social History Narrative   Works as a Education officer, environmental.     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   Adult children live in [place], [place] and [place].    Family History  Problem Relation Age of Onset  . Heart disease Daughter   . Hypertension Father   . Hypertension Sister   . Hypertension Brother   . Hypertension Maternal Grandfather   . Heart disease Maternal Grandfather   . Hypertension Paternal Grandfather   . Hypertension Brother   . Hypertension Sister   . Thyroid disease Neg Hx      Review of Systems  Constitutional: Negative.  Negative for chills and fever.  HENT: Negative.   Eyes: Negative.   Respiratory: Negative.  Negative for cough and shortness of breath.   Cardiovascular: Negative.  Negative for chest pain and palpitations.  Gastrointestinal: Negative.  Negative for abdominal pain, diarrhea, nausea and vomiting.  Genitourinary: Negative.  Negative for dysuria, frequency and hematuria.  Musculoskeletal: Positive for joint pain (Left hip).  Skin: Negative.  Negative  for rash.  Neurological: Negative.  Negative for dizziness and headaches.  Endo/Heme/Allergies: Negative.   All other systems reviewed and are negative.   Vitals:   05/30/18 1114  BP: 132/82  Pulse: 70  Resp: 16  Temp: 98 F (36.7 C)  SpO2: 98%    Physical Exam Vitals signs reviewed.  Constitutional:      Appearance: Normal appearance.  HENT:     Head: Normocephalic and atraumatic.  Neck:     Musculoskeletal: Normal range of motion.  Cardiovascular:     Rate and  Rhythm: Normal rate and regular rhythm.     Heart sounds: Normal heart sounds.  Pulmonary:     Effort: Pulmonary effort is normal.     Breath sounds: Normal breath sounds.  Abdominal:     Palpations: Abdomen is soft.     Tenderness: There is no abdominal tenderness.  Musculoskeletal:     Comments: Left hip: Positive to greater trochanter area.  Full range of motion but complaining of pain.  Otherwise unremarkable. Lower extremities: No tenderness or signs of DVT.  Within normal limits.  NVI.  Skin:    General: Skin is warm and dry.  Neurological:     General: No focal deficit present.     Mental Status: She is alert and oriented to person, place, and time.     Sensory: No sensory deficit.     Motor: No weakness.  Psychiatric:        Mood and Affect: Mood normal.        Behavior: Behavior normal.    Dg Hip Unilat W Or W/o Pelvis 2-3 Views Left  Result Date: 05/30/2018 CLINICAL DATA:  Left hip pain. EXAM: DG HIP (WITH OR WITHOUT PELVIS) 2-3V LEFT COMPARISON:  CT scan dated 05/02/2016 FINDINGS: Left total hip prosthesis appears in good position with no evidence of loosening or fracture. Pelvic bones appear normal except for a small benign bone island in the right ilium, unchanged. Stable soft tissue calcifications around the right hip. Calcification in 1 of the distal gluteal tendons above the left greater trochanter consistent with calcific tendinopathy. IMPRESSION: No acute abnormality. Calcific tendinopathy  one of the gluteal tendons just above the left greater trochanter. Electronically Signed   By: Lorriane Shire M.D.   On: 05/30/2018 11:50   A total of 25 minutes was spent in the room with the patient, greater than 50% of which was in counseling/coordination of care regarding differential diagnosis, x-ray review, treatment, medication and side effects, and need for follow-up with orthopedic surgeon.   ASSESSMENT & PLAN: Shanina was seen today for hip pain.  Diagnoses and all orders for this visit:  Left hip pain -     DG HIP UNILAT W OR W/O PELVIS 2-3 VIEWS LEFT; Future  Calcific tendinitis of left hip  Other orders -     Discontinue: HYDROcodone-acetaminophen (NORCO) 5-325 MG tablet; Take 1 tablet by mouth every 6 (six) hours as needed for severe pain. -     HYDROcodone-acetaminophen (NORCO) 5-325 MG tablet; Take 1 tablet by mouth every 6 (six) hours as needed for severe pain.    Patient Instructions       If you have lab work done today you will be contacted with your lab results within the next 2 weeks.  If you have not heard from [place] then please contact us. The fastest way to get your results is to register for My Chart.   IF you received an x-ray today, you will receive an invoice from [facility]. Please contact [facility] at [phone] with questions or concerns regarding your invoice.   IF you received labwork today, you will receive an invoice from [facility]. Please contact [facility] at [phone] with questions or concerns regarding your invoice.   Our billing staff will not be able to assist you with questions regarding bills from these companies.  You will be contacted with the lab results as soon as they are available. The fastest way to get your results is to activate your My Chart account. Instructions are  located on the last page of this paperwork. If you have not heard from [place] regarding the results in 2 weeks, please contact this office.      Calcific Tendinitis Calcific tendinitis occurs when crystals of calcium are deposited in a tendon. Tendons are tough, cord-like tissues that connect muscle to bone. Tendons are an important part of joints. They make joints move, and they absorb some of the stress that a joint receives during use. When calcium is deposited in the tendon, the tendon becomes stiff and painful and it can become swollen. Calcific tendinitis occurs frequently in a tendon in the shoulder joint (rotator cuff). What are the causes? The cause of calcific tendinitis is not known. It may be associated with:  Overusing a tendon, such as from repetitive motion.  Excess stress on the tendon.  Age-related wear and tear.  Repetitive, mild injuries. What increases the risk? This condition is more likely to develop in:  People who do activities that involve repetitive motions.  Older people. What are the signs or symptoms? This condition may or may not be painful. If there is pain, it may occur when moving the joint. Other symptoms may include:  Tenderness when pressure is applied to the tendon.  A snapping or popping sound when the joint moves.  Decreased motion of the joint.  Difficulty sleeping due to pain in the joint. How is this diagnosed? This condition is diagnosed with a physical exam. You may also have tests, such as:  X-rays.  MRI.  CT scan. How is this treated? This condition generally gets better on its own. Treatment may also include:  Resting, icing, applying pressure (compression), and raising (elevating) the area above the level of your heart. This is known as RICE therapy.  Medicines to help reduce inflammation or to help reduce pain.  Physical therapy to strengthen and stretch the tendon.  Following a specific exercise program to keep the joint working properly. Treatment for more severe calcific tendinitis may require:  Injecting steroids or pain-relieving medicines into or  around the joint.  Manipulating the joint after you are given medicine to numb the area (local anesthetic).  Inflating the joint with sterile fluid to increase the flexibility of the tendons.  Shock wave therapy, which involves focusing sound waves on the joint. If other treatments do not work, surgery may be done to clean out the calcium deposits and repair the tendon as needed. Most people do not need surgery. Follow these instructions at home: Managing pain, stiffness, and swelling      If directed, put heat on the affected area before you exercise or as often as told by your health care provider. Use the heat source that your health care provider recommends, such as a moist heat pack or a heating pad. ? Place a towel between your skin and the heat source. ? Leave the heat on for 20-30 minutes. ? Remove the heat if your skin turns bright red. This is especially important if you are unable to feel pain, heat, or cold. You may have a greater risk of getting burned.  Move the fingers or toes of the affected limb often, if this applies. This can help to prevent stiffness and lessen swelling.  If directed, elevate the affected area above the level of your heart while you are sitting or lying down.  If directed, put ice on the affected area: ? Put ice in a plastic bag. ? Place a towel between your skin and the bag. ?  Leave the ice on for 20 minutes, 2-3 times a day. General instructions  Take over-the-counter and prescription medicines only as told by your health care provider.  Do not drive or use heavy machinery while taking prescription pain medicine.  Follow recommendations from your health care provider for activity and exercise. Ask your health care provider what activities are safe for you.  Avoid using the affected area while you are experiencing symptoms of tendinitis.  Wear an elastic bandage or compression wrap only as told by your health care provider.  Keep all  follow-up visits as told by your health care provider. This is important. Contact a health care provider if:  You have pain or numbness that gets worse.  You develop new weakness.  You notice increased joint stiffness or a sensation of looseness in the joint.  You notice increasing redness, swelling, or warmth around the joint area. Get help right away if:  You have a fever for more than 2-3 days.  You have symptoms for more than 2-3 days.  You have a fever and your symptoms suddenly get worse. This information is not intended to replace advice given to you by your health care provider. Make sure you discuss any questions you have with your health care provider. Document Released: 01/21/2008 Document Revised: 01/06/2016 Document Reviewed: 01/06/2016 Elsevier Interactive Patient Education  2019 Elsevier Inc.      Agustina Caroli, MD [facility]

## 2018-06-13 DIAGNOSIS — M7062 Trochanteric bursitis, left hip: Secondary | ICD-10-CM | POA: Diagnosis not present

## 2018-06-13 DIAGNOSIS — Z471 Aftercare following joint replacement surgery: Secondary | ICD-10-CM | POA: Diagnosis not present

## 2018-06-13 DIAGNOSIS — Z96642 Presence of left artificial hip joint: Secondary | ICD-10-CM | POA: Diagnosis not present

## 2018-07-18 ENCOUNTER — Other Ambulatory Visit: Payer: Self-pay | Admitting: *Deleted

## 2018-07-18 ENCOUNTER — Telehealth (INDEPENDENT_AMBULATORY_CARE_PROVIDER_SITE_OTHER): Payer: Self-pay | Admitting: Orthopaedic Surgery

## 2018-07-18 MED ORDER — DICLOFENAC SODIUM 1 % TD GEL: TRANSDERMAL | 4 refills | Status: DC

## 2018-07-18 NOTE — Telephone Encounter (Signed)
Patient left a voicemail requesting prescription refill of Voltaren Gel to be sent to [facility] at [address] in [place].

## 2018-07-18 NOTE — Telephone Encounter (Signed)
Ok to refill 

## 2018-07-18 NOTE — Telephone Encounter (Signed)
Last seen 03/29/2017, last filled 09/08/2015. Please advise.

## 2018-07-18 NOTE — Telephone Encounter (Signed)
I called patient 

## 2018-07-29 ENCOUNTER — Telehealth: Payer: Self-pay | Admitting: Emergency Medicine

## 2018-07-29 NOTE — Telephone Encounter (Signed)
Copied from [facility] [id number]. Topic: General - Other >> Jul 28, 2018  2:14 PM Celene Kras A wrote: Reason for CRM: Pt called in stating she believed she is experiencing a yeast infection. Pt is requesting that an antibiotic be sent over to her pharmacy. Pt would like to avoid coming into the office. Pt states Boric Acid POWD works best for her. Please advise.  [place], [place] [address] Phone: [phone] Fax: [phone] Not a 24 hour pharmacy; exact hours not known.

## 2018-07-29 NOTE — Telephone Encounter (Signed)
Patient needs a tele med due to not being able to just give out medication.

## 2018-08-02 ENCOUNTER — Other Ambulatory Visit: Payer: Self-pay

## 2018-08-02 ENCOUNTER — Encounter: Payer: Self-pay | Admitting: Emergency Medicine

## 2018-08-02 ENCOUNTER — Ambulatory Visit (INDEPENDENT_AMBULATORY_CARE_PROVIDER_SITE_OTHER): Payer: 59 | Admitting: Emergency Medicine

## 2018-08-02 ENCOUNTER — Telehealth (INDEPENDENT_AMBULATORY_CARE_PROVIDER_SITE_OTHER): Payer: 59 | Admitting: Emergency Medicine

## 2018-08-02 DIAGNOSIS — N898 Other specified noninflammatory disorders of vagina: Secondary | ICD-10-CM

## 2018-08-02 DIAGNOSIS — B9689 Other specified bacterial agents as the cause of diseases classified elsewhere: Secondary | ICD-10-CM | POA: Insufficient documentation

## 2018-08-02 DIAGNOSIS — N760 Acute vaginitis: Secondary | ICD-10-CM | POA: Diagnosis not present

## 2018-08-02 LAB — POCT WET + KOH PREP
Trich by wet prep: ABSENT
Yeast by KOH: ABSENT
Yeast by wet prep: ABSENT

## 2018-08-02 LAB — POCT URINALYSIS DIP (MANUAL ENTRY)
Bilirubin, UA: NEGATIVE
Glucose, UA: NEGATIVE mg/dL
Leukocytes, UA: NEGATIVE
Nitrite, UA: NEGATIVE
Protein Ur, POC: NEGATIVE mg/dL
Spec Grav, UA: 1.025 (ref 1.010–1.025)
Urobilinogen, UA: 0.2 E.U./dL
pH, UA: 5 (ref 5.0–8.0)

## 2018-08-02 MED ORDER — BORIC ACID POWD: 600.0000 mg | 0 refills | Status: DC

## 2018-08-02 NOTE — Progress Notes (Signed)
Telemedicine Encounter- SOAP NOTE Established Patient  This telephone encounter was conducted with the patient's (or proxy's) verbal consent via audio telecommunications: yes/no: Yes Patient was instructed to have this encounter in a suitably private space; and to only have persons present to whom they give permission to participate. In addition, patient identity was confirmed by use of name plus two identifiers (DOB and address).  I discussed the limitations, risks, security and privacy concerns of performing an evaluation and management service by telephone and the availability of in person appointments. I also discussed with the patient that there may be a patient responsible charge related to this service. The patient expressed understanding and agreed to proceed.  I spent a total of TIME; 0 MIN TO 60 MIN: 15 minutes talking with the patient or their proxy.  No chief complaint on file. Milky vaginal discharge for several days  Subjective   Kendra Gonzalez is a 48 y.o. female established patient. Telephone visit today for evaluation of milky vaginal discharge for several days.  Has a history of recurrent bacterial vaginosis and this is behaving typically.  Requesting boric acid medication that works for her.  Has tried and failed Flagyl treatments before.  Denies pain, fever or chills.  Denies any other significant symptoms.  HPI   Patient Active Problem List   Diagnosis Date Noted  . Left hip pain 05/30/2018  . Calcific tendinitis of left hip 05/30/2018  . Presence of left artificial hip joint 07/09/2017  . Hyperthyroidism 11/03/2016  . Multinodular goiter 04/17/2015  . History of stomach ulcers 03/22/2015  . Chronic pain of left heel 04/23/2014  . Chronic pain syndrome 03/19/2014  . Asthma 01/04/2013  . Abdominal pain, chronic, right upper quadrant 06/23/2012  . BMI 29.0-29.9,adult 10/12/2011  . 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 Trochanteric bursitis of left hip  01/22/2011  . Hyperlipidemia 02/16/2007  . ALLERGIC RHINITIS 02/16/2007  . GERD 02/16/2007    Past Medical History:  Diagnosis Date  . Allergy   . Arthritis   . Asthma   . Endometriosis   . GERD (gastroesophageal reflux disease)   . History of tetanus, diphtheria, and acellular pertussis booster vaccination (Tdap)    07/30/2010  . Hyperlipidemia   . Kidney stones   . Pancreatitis     Current Outpatient Medications  Medication Sig Dispense Refill  . albuterol (PROVENTIL HFA;VENTOLIN HFA) 108 (90 Base) MCG/ACT inhaler Inhale 2 puffs into the lungs every 6 (six) hours as needed for wheezing or shortness of breath. 1 Inhaler 6  . albuterol (PROVENTIL) (2.5 MG/3ML) 0.083% nebulizer solution Take 3 mLs (2.5 mg total) by nebulization every 6 (six) hours as needed for wheezing or shortness of breath. 150 mL 1  . diclofenac sodium (VOLTAREN) 1 % GEL Apply up to three times a day as needed to large joint. 3 Tube 4  . fluticasone-salmeterol (ADVAIR HFA) 115-21 MCG/ACT inhaler Inhale 2 puffs into the lungs 2 (two) times daily. 1 Inhaler 11  . gabapentin (NEURONTIN) 300 MG capsule Take 1 capsule (300 mg total) by mouth 3 (three) times daily. 90 capsule 0  . HYDROcodone-acetaminophen (NORCO) 5-325 MG tablet Take 1 tablet by mouth every 6 (six) hours as needed for severe pain. 12 tablet 0  . levocetirizine (XYZAL) 5 MG tablet Take 1 tablet (5 mg total) by mouth every evening. 90 tablet 4  . magnesium gluconate (MAGONATE) 500 MG tablet Take 500 mg by mouth at bedtime.    . Multiple Vitamins-Minerals (MULTIVITAMIN  WITH MINERALS) tablet Take 1 tablet by mouth daily.    . pantoprazole (PROTONIX) 40 MG tablet Take 1 tablet (40 mg total) by mouth daily. 90 tablet 4  . venlafaxine XR (EFFEXOR XR) 37.5 MG 24 hr capsule Take 1 capsule (37.5 mg total) by mouth daily with breakfast. 90 capsule 1  . VOLTAREN 1 % GEL     . Boric Acid POWD Place 600 mg vaginally as directed. Place 600 mg capsule intravaginally  once a week as needed for irritation: Boric acid suppository 113 g 0   No current facility-administered medications for this visit.     Allergies  Allergen Reactions  . Hydromorphone Itching    Social History   Socioeconomic History  . Marital status: Married    Spouse name: Fritz Pickerel  . Number of children: 2  . Years of education: Not on file  . Highest education level: Not on file  Occupational History  . Occupation: Nursing Aid    Employer: [employer]  Social Needs  . Financial resource strain: Not on file  . Food insecurity:    Worry: Not on file    Inability: Not on file  . Transportation needs:    Medical: Not on file    Non-medical: Not on file  Tobacco Use  . Smoking status: Never Smoker  . Smokeless tobacco: Never Used  Substance and Sexual Activity  . Alcohol use: No  . Drug use: No  . Sexual activity: Yes    Birth control/protection: None  Lifestyle  . Physical activity:    Days per week: 5 days    Minutes per session: 150+ min  . Stress: Not on file  Relationships  . Social connections:    Talks on phone: Not on file    Gets together: Not on file    Attends religious service: Not on file    Active member of club or organization: Not on file    Attends meetings of clubs or organizations: Not on file    Relationship status: Not on file  . Intimate partner violence:    Fear of current or ex partner: Not on file    Emotionally abused: Not on file    Physically abused: Not on file    Forced sexual activity: Not on file  Other Topics Concern  . Not on file  Social History Narrative   Works as a Education officer, environmental.     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   Adult children live in [place], [place] and [place].    Review of Systems  Constitutional: Negative.  Negative for chills and fever.  HENT: Negative for congestion and sore throat.   Eyes: Negative.   Respiratory: Negative.  Negative for cough and shortness of breath.   Cardiovascular: Negative.  Negative  for chest pain and palpitations.  Gastrointestinal: Negative for abdominal pain, diarrhea, nausea and vomiting.  Genitourinary: Negative for dysuria.       Vaginal discharge  Skin: Negative.   Neurological: Negative for dizziness and headaches.  All other systems reviewed and are negative.   Objective   Vitals as reported by the patient: None reported There were no vitals filed for this visit. Patient is awake and oriented x3, in no apparent respiratory distress while talking. Diagnoses and all orders for this visit:  Bacterial vaginosis -     Boric Acid POWD; Place 600 mg vaginally as directed. Place 600 mg capsule intravaginally once a week as needed for irritation: Boric acid suppository  No other medical concerns identified during this visit.   I discussed the assessment and treatment plan with the patient. The patient was provided an opportunity to ask questions and all were answered. The patient agreed with the plan and demonstrated an understanding of the instructions.   The patient was advised to call back or seek an in-person evaluation if the symptoms worsen or if the condition fails to improve as anticipated.  I provided 15 minutes of non-face-to-face time during this encounter.  Horald Pollen, MD  Primary Care at [facility]

## 2018-08-02 NOTE — Progress Notes (Signed)
Pt c/o of vaginal itching and discharge milky looking started last week and wants boric acid. Says she gets them often is coming in before for POCT.

## 2018-08-08 ENCOUNTER — Ambulatory Visit: Payer: Self-pay | Admitting: *Deleted

## 2018-08-08 ENCOUNTER — Encounter: Payer: Self-pay | Admitting: Emergency Medicine

## 2018-08-08 NOTE — Telephone Encounter (Signed)
I have a fever blister.   I'm using OTC stuff and it's worse.    I'm wanting to have something prescribed.  I've had these before.  I warm transferred her to Primary Care at [facility] to have a video chat visit scheduled due to the COVID-19 pandemic.       Reason for Disposition . 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 APPEARANCE of BLISTERS: "Describe the sores."     Top of my lip.  I have a blister.   I've had these before. 2. SIZE: "How large an area is involved with the cold sores?" (e.g., inches, cm or compare to coins)     It's the size of a pencil eraser or larger. 3. LOCATION: "Which part of the lip is involved?"     Top of my upper lip. 4. ONSET: "When did the fever blisters begin?"     Noticed it yesterday. 5. RECURRENT BLISTERS: "Have you had fever blisters before?" If so, ask: "When was the last time?" "How many times a year?"     Yes  Many years ago. 6. OTHER SYMPTOMS: "Do you have any other symptoms?" (e.g., fever, sores inside mouth)     No 7. PREGNANCY: "Is there any chance you are pregnant?" "When was your last menstrual period?"     No  Protocols used: COLD SORES (FEVER BLISTERS)-A-AH

## 2018-08-10 ENCOUNTER — Other Ambulatory Visit: Payer: Self-pay

## 2018-08-10 ENCOUNTER — Telehealth (INDEPENDENT_AMBULATORY_CARE_PROVIDER_SITE_OTHER): Payer: 59 | Admitting: Family Medicine

## 2018-08-10 DIAGNOSIS — B001 Herpesviral vesicular dermatitis: Secondary | ICD-10-CM

## 2018-08-10 MED ORDER — VALACYCLOVIR HCL 1 G PO TABS: ORAL_TABLET | ORAL | 5 refills | Status: DC

## 2018-08-10 NOTE — Patient Instructions (Signed)
Start taking when first sign of lesion for repeat occurence

## 2018-08-10 NOTE — Progress Notes (Signed)
Telemedicine Encounter- SOAP NOTE Established Patient  I discussed the limitations, risks, security and privacy concerns of performing an evaluation and management service by telephone and the availability of in person appointments. I also discussed with the patient that there may be a patient responsible charge related to this service. The patient expressed understanding and agreed to proceed.  This telephone encounter was conducted with the patient's verbal consent via audio telecommunications: yes Patient was instructed to have this encounter in a suitably private space; and to only have persons present to whom they give permission to participate. In addition, patient identity was confirmed by use of name plus two identifiers (DOB and address).  I spent a total of 17min talking with the patient or their proxy.  Pt states she currently has a fever blister and would like a refill on her Valacyclovir. Pt states she was given the prescription in the past and it really helped with her fever blisters. She states the blister has been present x 3 days.   Subjective   Kendra Gonzalez is a 48 y.o. female established patient. Telephone visit today for oral herpes(fever blister). Noted several days ago  HPI  Pt states fever blister noted several days ago. Pt states she has experienced fever blisters in the past but the last one was 1 year ago-pt states tingling now blister on upper lip. 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  Took Valtrex in the past Patient Active Problem List   Diagnosis Date Noted  . Bacterial vaginosis 08/02/2018  . Calcific tendinitis of left hip 05/30/2018  . Presence of left artificial hip joint 07/09/2017  . Hyperthyroidism 11/03/2016  . Multinodular goiter 04/17/2015  . History of stomach ulcers 03/22/2015  . Chronic pain of left heel 04/23/2014  . Chronic pain syndrome 03/19/2014  . Asthma 01/04/2013  . Abdominal pain, chronic, right upper quadrant  06/23/2012  . BMI 29.0-29.9,adult 10/12/2011  . 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 Trochanteric bursitis of left hip 01/22/2011  . Hyperlipidemia 02/16/2007  . ALLERGIC RHINITIS 02/16/2007  . GERD 02/16/2007    Past Medical History:  Diagnosis Date  . Allergy   . Arthritis   . Asthma   . Endometriosis   . GERD (gastroesophageal reflux disease)   . History of tetanus, diphtheria, and acellular pertussis booster vaccination (Tdap)    07/30/2010  . Hyperlipidemia   . Kidney stones   . Pancreatitis     Current Outpatient Medications  Medication Sig Dispense Refill  . albuterol (PROVENTIL HFA;VENTOLIN HFA) 108 (90 Base) MCG/ACT inhaler Inhale 2 puffs into the lungs every 6 (six) hours as needed for wheezing or shortness of breath. 1 Inhaler 6  . albuterol (PROVENTIL) (2.5 MG/3ML) 0.083% nebulizer solution Take 3 mLs (2.5 mg total) by nebulization every 6 (six) hours as needed for wheezing or shortness of breath. 150 mL 1  . Boric Acid POWD Place 600 mg vaginally as directed. Place 600 mg capsule intravaginally once a week as needed for irritation: Boric acid suppository 113 g 0  . diclofenac sodium (VOLTAREN) 1 % GEL Apply up to three times a day as needed to large joint. 3 Tube 4  . fluticasone-salmeterol (ADVAIR HFA) 115-21 MCG/ACT inhaler Inhale 2 puffs into the lungs 2 (two) times daily. 1 Inhaler 11  . gabapentin (NEURONTIN) 300 MG capsule Take 1 capsule (300 mg total) by mouth 3 (three) times daily. 90 capsule 0  . HYDROcodone-acetaminophen (NORCO) 5-325 MG tablet  Take 1 tablet by mouth every 6 (six) hours as needed for severe pain. 12 tablet 0  . levocetirizine (XYZAL) 5 MG tablet Take 1 tablet (5 mg total) by mouth every evening. 90 tablet 4  . magnesium gluconate (MAGONATE) 500 MG tablet Take 500 mg by mouth at bedtime.    . Multiple Vitamins-Minerals (MULTIVITAMIN WITH MINERALS) tablet Take 1 tablet by mouth daily.    . pantoprazole (PROTONIX) 40 MG tablet Take 1 tablet (40 mg  total) by mouth daily. 90 tablet 4  . venlafaxine XR (EFFEXOR XR) 37.5 MG 24 hr capsule Take 1 capsule (37.5 mg total) by mouth daily with breakfast. 90 capsule 1  . VOLTAREN 1 % GEL      No current facility-administered medications for this visit.     Allergies  Allergen Reactions  . Hydromorphone Itching    Social History   Socioeconomic History  . Marital status: Married    Spouse name: Fritz Pickerel  . Number of children: 2  . Years of education: Not on file  . Highest education level: Not on file  Occupational History  . Occupation: Nursing Aid    Employer: [employer]  Social Needs  . Financial resource strain: Not on file  . Food insecurity:    Worry: Not on file    Inability: Not on file  . Transportation needs:    Medical: Not on file    Non-medical: Not on file  Tobacco Use  . Smoking status: Never Smoker  . Smokeless tobacco: Never Used  Substance and Sexual Activity  . Alcohol use: No  . Drug use: No  . Sexual activity: Yes    Birth control/protection: None  Lifestyle  . Physical activity:    Days per week: 5 days    Minutes per session: 150+ min  . Stress: Not on file  Relationships  . Social connections:    Talks on phone: Not on file    Gets together: Not on file    Attends religious service: Not on file    Active member of club or organization: Not on file    Attends meetings of clubs or organizations: Not on file    Relationship status: Not on file  . Intimate partner violence:    Fear of current or ex partner: Not on file    Emotionally abused: Not on file    Physically abused: Not on file    Forced sexual activity: Not on file  Other Topics Concern  . Not on file  Social History Narrative   Works as a Education officer, environmental.     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   Adult children live in [place], [place] and [place].    ROS CONSTITUTIONAL: no  fever, blister on upper lip   Objective   Vitals as reported by the patient:none  1. Fever blister  Valtrex-rx Refills for use if repeat I discussed the assessment and treatment plan with the patient. The patient was provided an opportunity to ask questions and all were answered. The patient agreed with the plan and demonstrated an understanding of the instructions.   The patient was advised to call back or seek an in-person evaluation if the symptoms worsen or if the condition fails to improve as anticipated.  I provided 5 minutes of non-face-to-face time during this encounter.  LISA Hannah Beat, MD  Primary Care at [facility] 08-10-18

## 2018-08-10 NOTE — Progress Notes (Signed)
Pt states she currently has a fever blister and would like a refill on her Valacyclovir. Pt states she was given the prescription in the past and it really helped with her fever blisters. She states the blister has been present x 3 days.

## 2018-10-27 MED FILL — DICLOFENAC EPOLAMINE 1.3 %: 1.3 | 30 days supply | Qty: 60 | Fill #0

## 2018-11-03 ENCOUNTER — Other Ambulatory Visit: Payer: Self-pay

## 2018-11-03 ENCOUNTER — Ambulatory Visit
Admission: EM | Admit: 2018-11-03 | Discharge: 2018-11-03 | Disposition: A | Discharge status: Home / Self Care | Payer: 59 | Attending: Family Medicine | Admitting: Family Medicine

## 2018-11-03 ENCOUNTER — Encounter: Payer: Self-pay | Admitting: Emergency Medicine

## 2018-11-03 DIAGNOSIS — R1031 Right lower quadrant pain: Secondary | ICD-10-CM | POA: Diagnosis present

## 2018-11-03 DIAGNOSIS — R350 Frequency of micturition: Secondary | ICD-10-CM | POA: Diagnosis present

## 2018-11-03 LAB — POCT URINALYSIS DIP (MANUAL ENTRY)
Bilirubin, UA: NEGATIVE
Glucose, UA: NEGATIVE mg/dL
Ketones, POC UA: NEGATIVE mg/dL
Leukocytes, UA: NEGATIVE
Nitrite, UA: NEGATIVE
Protein Ur, POC: NEGATIVE mg/dL
Spec Grav, UA: >=1.03 — AB (ref 1.010–1.025)
Urobilinogen, UA: 0.2 E.U./dL
pH, UA: 6.5 (ref 5.0–8.0)

## 2018-11-03 MED ORDER — INDOMETHACIN 50 MG PO CAPS: 50.0000 mg | ORAL_CAPSULE | Freq: Two times a day (BID) | ORAL | 0 refills | Status: DC

## 2018-11-03 MED ORDER — TAMSULOSIN HCL 0.4 MG PO CAPS: 0.4000 mg | ORAL_CAPSULE | Freq: Every day | ORAL | 0 refills | Status: DC

## 2018-11-03 NOTE — ED Notes (Signed)
Patient able to ambulate independently  

## 2018-11-03 NOTE — ED Triage Notes (Signed)
Patient presents to [facility] for assessment of rLQ pain, worse when walking, and urinary frequency.  Denies blood in urine.

## 2018-11-03 NOTE — Discharge Instructions (Signed)
Your urine did not show signs of infection.  We will send this off for culture to confirm.  We will go ahead and treat you for possible kidney stone today. Begin Flomax daily Push fluids Indomethacin twice daily for pain  If not seeing stone pass or developing worsening pain, fever, vomiting, please follow-up in the emergency room

## 2018-11-04 NOTE — ED Provider Notes (Signed)
[name]    CSN: [id number] Arrival date & time: 11/03/18  1746      History   Chief Complaint Chief Complaint  Patient presents with  . APPOINTMENT: 6:00  . Abdominal Pain  . Urinary Frequency    HPI Kendra Gonzalez is a 48 y.o. female History of HLD, GERD, Asthma, hysterectomy, cholecystectomy, presenting today for evaluation of abdominal pain and urinary frequency. Symptoms began yesterday. Concerned about UTI or kidney stones. Pain is in right lower quadrant and radiates towards groin. Denies dysuria, urgency and hematuria. No back pain different from her baseline. Denies nausea, vomiting. No appetite changes. Eating and drinking normally without worsening of pain. Denies vaginal discharge and odor. Does have history of BV, but does not feel like her typical symptoms. Denies fever, chills or body aches. Denies URI symptoms. Denies chest pain or shortness of breath.   HPI  Past Medical History:  Diagnosis Date  . Allergy   . Arthritis   . Asthma   . Endometriosis   . GERD (gastroesophageal reflux disease)   . History of tetanus, diphtheria, and acellular pertussis booster vaccination (Tdap)    07/30/2010  . Hyperlipidemia   . Kidney stones   . Pancreatitis     Patient Active Problem List   Diagnosis Date Noted  . Fever blister 08/10/2018  . Bacterial vaginosis 08/02/2018  . Calcific tendinitis of left hip 05/30/2018  . Presence of left artificial hip joint 07/09/2017  . Hyperthyroidism 11/03/2016  . Multinodular goiter 04/17/2015  . History of stomach ulcers 03/22/2015  . Chronic pain of left heel 04/23/2014  . Chronic pain syndrome 03/19/2014  . Asthma 01/04/2013  . Abdominal pain, chronic, right upper quadrant 06/23/2012  . BMI 29.0-29.9,adult 10/12/2011  . 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 Trochanteric bursitis of left hip 01/22/2011  . Hyperlipidemia 02/16/2007  . ALLERGIC RHINITIS 02/16/2007  . GERD 02/16/2007    Past Surgical  History:  Procedure Laterality Date  . ABDOMINAL HYSTERECTOMY    . arthroscopic knee surgery     . carpel tunnel    . CHOLECYSTECTOMY    . ERCP  01/08/2012   Procedure: ENDOSCOPIC RETROGRADE CHOLANGIOPANCREATOGRAPHY (ERCP);  Surgeon: Beryle Beams, MD;  Location: [facility] ENDOSCOPY;  Service: Endoscopy;  Laterality: N/A;  . HIP SURGERY    . oophrectomy  2011   secondary to cysts  . TUBAL LIGATION      OB History   No obstetric history on file.      Home Medications    Prior to Admission medications   Medication Sig Start Date End Date Taking? Authorizing Provider  venlafaxine XR (EFFEXOR XR) 37.5 MG 24 hr capsule Take 1 capsule (37.5 mg total) by mouth daily with breakfast. 10/21/17  Yes Weber, Sarah L, PA-C  albuterol (PROVENTIL HFA;VENTOLIN HFA) 108 (90 Base) MCG/ACT inhaler Inhale 2 puffs into the lungs every 6 (six) hours as needed for wheezing or shortness of breath. 04/02/17   Harrison Mons, PA  albuterol (PROVENTIL) (2.5 MG/3ML) 0.083% nebulizer solution Take 3 mLs (2.5 mg total) by nebulization every 6 (six) hours as needed for wheezing or shortness of breath. 04/10/17   Jaynee Eagles, PA-C  Boric Acid POWD Place 600 mg vaginally as directed. Place 600 mg capsule intravaginally once a week as needed for irritation: Boric acid suppository 08/02/18   Horald Pollen, MD  diclofenac sodium (VOLTAREN) 1 % GEL Apply up to three times a day as needed to large joint. 07/18/18  Garald Balding, MD  fluticasone-salmeterol (ADVAIR HFA) [phone] MCG/ACT inhaler Inhale 2 puffs into the lungs 2 (two) times daily. 04/02/17   Harrison Mons, PA  gabapentin (NEURONTIN) 300 MG capsule Take 1 capsule (300 mg total) by mouth 3 (three) times daily. 10/21/17   Gale Journey, Damaris Hippo, PA-C  indomethacin (INDOCIN) 50 MG capsule Take 1 capsule (50 mg total) by mouth 2 (two) times daily with a meal. 11/03/18   Kamora Vossler C, PA-C  levocetirizine (XYZAL) 5 MG tablet Take 1 tablet (5 mg total) by mouth every  evening. 09/16/17   Gale Journey, Damaris Hippo, PA-C  magnesium gluconate (MAGONATE) 500 MG tablet Take 500 mg by mouth at bedtime.    [provider]  Multiple Vitamins-Minerals (MULTIVITAMIN WITH MINERALS) tablet Take 1 tablet by mouth daily.    [provider]  tamsulosin (FLOMAX) 0.4 MG CAPS capsule Take 1 capsule (0.4 mg total) by mouth daily. 11/03/18   Junior Kenedy C, PA-C  valACYclovir (VALTREX) 1000 MG tablet Take 2 po twice today 08/10/18   Maryruth Hancock, MD  VOLTAREN 1 % GEL  09/13/15   [provider]  pantoprazole (PROTONIX) 40 MG tablet Take 1 tablet (40 mg total) by mouth daily. 09/16/17 11/03/18  Mancel Bale, PA-C    Family History Family History  Problem Relation Age of Onset  . Heart disease Daughter   . Hypertension Father   . Hypertension Sister   . Hypertension Brother   . Hypertension Maternal Grandfather   . Heart disease Maternal Grandfather   . Hypertension Paternal Grandfather   . Hypertension Brother   . Hypertension Sister   . Thyroid disease Neg Hx     Social History Social History   Tobacco Use  . Smoking status: Never Smoker  . Smokeless tobacco: Never Used  Substance Use Topics  . Alcohol use: No  . Drug use: No     Allergies   Hydromorphone   Review of Systems Review of Systems  Constitutional: Negative for activity change, appetite change, chills, fatigue and fever.  HENT: Negative for congestion, ear pain, rhinorrhea, sinus pressure, sore throat and trouble swallowing.   Eyes: Negative for discharge and redness.  Respiratory: Negative for cough, chest tightness and shortness of breath.   Cardiovascular: Negative for chest pain.  Gastrointestinal: Positive for abdominal pain. Negative for diarrhea, nausea and vomiting.  Genitourinary: Positive for frequency. Negative for dysuria, flank pain, genital sores, hematuria, menstrual problem, vaginal bleeding, vaginal discharge and vaginal pain.  Musculoskeletal: Negative for  back pain and myalgias.  Skin: Negative for rash.  Neurological: Negative for dizziness, light-headedness and headaches.     Physical Exam Triage Vital Signs ED Triage Vitals  Enc Vitals Group     BP 11/03/18 1806 115/79     Pulse Rate 11/03/18 1806 (!) 59     Resp 11/03/18 1806 16     Temp 11/03/18 1806 (!) 97.5 F (36.4 C)     Temp Source 11/03/18 1806 Temporal     SpO2 11/03/18 1806 98 %     Weight --      Height --      Head Circumference --      Peak Flow --      Pain Score 11/03/18 1807 6     Pain Loc --      Pain Edu? --      Excl. in [place]? --    No data found.  Updated Vital Signs BP 115/79 (BP Location: Left  Arm)   Pulse (!) 59   Temp (!) 97.5 F (36.4 C) (Temporal)   Resp 16   LMP 06/06/1996   SpO2 98%   Visual Acuity Right Eye Distance:   Left Eye Distance:   Bilateral Distance:    Right Eye Near:   Left Eye Near:    Bilateral Near:     Physical Exam Vitals signs and nursing note reviewed.  Constitutional:      General: She is not in acute distress.    Appearance: She is well-developed.     Comments: Well appearing. Sitting comfortably in exam chair  HENT:     Head: Normocephalic and atraumatic.  Eyes:     Conjunctiva/sclera: Conjunctivae normal.  Neck:     Musculoskeletal: Neck supple.  Cardiovascular:     Rate and Rhythm: Normal rate and regular rhythm.     Heart sounds: No murmur.  Pulmonary:     Effort: Pulmonary effort is normal. No respiratory distress.     Breath sounds: Normal breath sounds.     Comments: Breathing comfortably at rest, CTABL, no wheezing, rales or other adventitious sounds auscultated Abdominal:     Palpations: Abdomen is soft.     Tenderness: There is abdominal tenderness.     Comments: Soft, non distended, tender to palpation in right lower quadrant, negative rebound, negative mc burneys. Negative murphys, negative rovsing.  No CVA tenderness  Skin:    General: Skin is warm and dry.  Neurological:      General: No focal deficit present.     Mental Status: She is alert and oriented to person, place, and time.      UC Treatments / Results  Labs (all labs ordered are listed, but only abnormal results are displayed) Labs Reviewed  POCT URINALYSIS DIP (MANUAL ENTRY) - Abnormal; Notable for the following components:      Result Value   Spec Grav, UA >=1.030 (*)    Blood, UA trace-intact (*)    All other components within normal limits  URINE CULTURE    EKG   Radiology No results found.  Procedures Procedures (including critical care time)  Medications Ordered in UC Medications - No data to display  Initial Impression / Assessment and Plan / UC Course  I have reviewed the triage vital signs and the nursing notes.  Pertinent labs & imaging results that were available during my care of the patient were reviewed by me and considered in my medical decision making (see chart for details).     UA negative for signs of UTI, will send for culture to confirm. Only trace blood, but does have history of previous kidney stones. Tender in RLQ, but not suggestive of acute appendicitis at this time, negative peritoneal signs, VSS, no nausea, vomiting, tolerating oral intake. Did discuss this with patient given location, advised to follow up in ED for further eval if symptoms worsening in this area. Will treat for possible kidney stone at this time- flomax, indomethacin. Continue to monitor, Discussed strict return precautions. Patient verbalized understanding and is agreeable with plan.  Final Clinical Impressions(s) / UC Diagnoses   Final diagnoses:  Urinary frequency  Right lower quadrant abdominal pain     Discharge Instructions     Your urine did not show signs of infection.  We will send this off for culture to confirm.  We will go ahead and treat you for possible kidney stone today. Begin Flomax daily Push fluids Indomethacin twice daily for pain  If  not seeing stone pass or  developing worsening pain, fever, vomiting, please follow-up in the emergency room   ED Prescriptions    Medication Sig Dispense Auth. Provider   tamsulosin (FLOMAX) 0.4 MG CAPS capsule Take 1 capsule (0.4 mg total) by mouth daily. 7 capsule Rola Lennon C, PA-C   indomethacin (INDOCIN) 50 MG capsule Take 1 capsule (50 mg total) by mouth 2 (two) times daily with a meal. 20 capsule Tharun Cappella C, PA-C     Controlled Substance Prescriptions [place] Controlled Substance Registry consulted? Not Applicable   Janith Lima, [place] 11/04/18 1134

## 2018-11-05 LAB — URINE CULTURE: Culture: 10000 — AB

## 2018-11-07 ENCOUNTER — Ambulatory Visit: Payer: 59 | Admitting: Family Medicine

## 2018-11-08 ENCOUNTER — Encounter: Payer: Self-pay | Admitting: Family Medicine

## 2018-11-30 ENCOUNTER — Encounter (HOSPITAL_COMMUNITY): Payer: Self-pay

## 2018-11-30 ENCOUNTER — Other Ambulatory Visit: Payer: Self-pay

## 2018-11-30 ENCOUNTER — Emergency Department (HOSPITAL_COMMUNITY)
Admission: EM | Admit: 2018-11-30 | Discharge: 2018-11-30 | Disposition: A | Discharge status: Home / Self Care | Payer: 59 | Attending: Emergency Medicine | Admitting: Emergency Medicine

## 2018-11-30 ENCOUNTER — Emergency Department (HOSPITAL_COMMUNITY): Payer: 59

## 2018-11-30 ENCOUNTER — Other Ambulatory Visit (HOSPITAL_COMMUNITY): Payer: Self-pay

## 2018-11-30 DIAGNOSIS — Z87442 Personal history of urinary calculi: Secondary | ICD-10-CM | POA: Insufficient documentation

## 2018-11-30 DIAGNOSIS — J45909 Unspecified asthma, uncomplicated: Secondary | ICD-10-CM | POA: Diagnosis not present

## 2018-11-30 DIAGNOSIS — N200 Calculus of kidney: Secondary | ICD-10-CM | POA: Insufficient documentation

## 2018-11-30 DIAGNOSIS — R1013 Epigastric pain: Secondary | ICD-10-CM | POA: Diagnosis present

## 2018-11-30 DIAGNOSIS — Z79899 Other long term (current) drug therapy: Secondary | ICD-10-CM | POA: Insufficient documentation

## 2018-11-30 LAB — COMPREHENSIVE METABOLIC PANEL
ALT: 16 U/L (ref 0–44)
AST: 22 U/L (ref 15–41)
Albumin: 4.1 g/dL (ref 3.5–5.0)
Alkaline Phosphatase: 80 U/L (ref 38–126)
Anion gap: 12 (ref 5–15)
BUN: 13 mg/dL (ref 6–20)
CO2: 21 mmol/L — ABNORMAL LOW (ref 22–32)
Calcium: 9.6 mg/dL (ref 8.9–10.3)
Chloride: 103 mmol/L (ref 98–111)
Creatinine, Ser: 1.05 mg/dL — ABNORMAL HIGH (ref 0.44–1.00)
GFR calc Af Amer: >60 mL/min (ref >60)
GFR calc non Af Amer: >60 mL/min (ref >60)
Glucose, Bld: 134 mg/dL — ABNORMAL HIGH (ref 70–99)
Potassium: 3 mmol/L — ABNORMAL LOW (ref 3.5–5.1)
Sodium: 136 mmol/L (ref 135–145)
Total Bilirubin: 0.9 mg/dL (ref 0.3–1.2)
Total Protein: 7.6 g/dL (ref 6.5–8.1)

## 2018-11-30 LAB — URINALYSIS, ROUTINE W REFLEX MICROSCOPIC
Bilirubin Urine: NEGATIVE
Glucose, UA: NEGATIVE mg/dL
Ketones, ur: NEGATIVE mg/dL
Leukocytes,Ua: NEGATIVE
Nitrite: NEGATIVE
Protein, ur: NEGATIVE mg/dL
Specific Gravity, Urine: 1.013 (ref 1.005–1.030)
pH: 7 (ref 5.0–8.0)

## 2018-11-30 LAB — RAPID URINE DRUG SCREEN, HOSP PERFORMED
Amphetamines: NOT DETECTED
Barbiturates: NOT DETECTED
Benzodiazepines: NOT DETECTED
Cocaine: NOT DETECTED
Opiates: NOT DETECTED
Tetrahydrocannabinol: NOT DETECTED

## 2018-11-30 LAB — TROPONIN I (HIGH SENSITIVITY)
Troponin I (High Sensitivity): 2 ng/L (ref <18)
Troponin I (High Sensitivity): 3 ng/L (ref <18)

## 2018-11-30 LAB — LIPASE, BLOOD: Lipase: 26 U/L (ref 11–51)

## 2018-11-30 LAB — D-DIMER, QUANTITATIVE: D-Dimer, Quant: 0.39 ug/mL-FEU (ref 0.00–0.50)

## 2018-11-30 LAB — CBC WITH DIFFERENTIAL/PLATELET
Abs Immature Granulocytes: 0.02 10*3/uL (ref 0.00–0.07)
Basophils Absolute: 0.1 10*3/uL (ref 0.0–0.1)
Basophils Relative: 1 %
Eosinophils Absolute: 0.2 10*3/uL (ref 0.0–0.5)
Eosinophils Relative: 1 %
HCT: 42.5 % (ref 36.0–46.0)
Hemoglobin: 14.5 g/dL (ref 12.0–15.0)
Immature Granulocytes: 0 %
Lymphocytes Relative: 49 %
Lymphs Abs: 5.6 10*3/uL — ABNORMAL HIGH (ref 0.7–4.0)
MCH: 29.7 pg (ref 26.0–34.0)
MCHC: 34.1 g/dL (ref 30.0–36.0)
MCV: 86.9 fL (ref 80.0–100.0)
Monocytes Absolute: 0.8 10*3/uL (ref 0.1–1.0)
Monocytes Relative: 7 %
Neutro Abs: 4.9 10*3/uL (ref 1.7–7.7)
Neutrophils Relative %: 42 %
Platelets: 319 10*3/uL (ref 150–400)
RBC: 4.89 MIL/uL (ref 3.87–5.11)
RDW: 12.8 % (ref 11.5–15.5)
WBC: 11.6 10*3/uL — ABNORMAL HIGH (ref 4.0–10.5)
nRBC: 0 % (ref 0.0–0.2)

## 2018-11-30 MED ORDER — POTASSIUM CHLORIDE 10 MEQ/100ML IV SOLN
10.0000 meq | Freq: Once | INTRAVENOUS | Status: AC
  Administered 2018-11-30: 10 meq via INTRAVENOUS
  Filled 2018-11-30: qty 100

## 2018-11-30 MED ORDER — SODIUM CHLORIDE 0.9 % IV BOLUS
1000.0000 mL | Freq: Once | INTRAVENOUS | Status: AC
  Administered 2018-11-30: 1000 mL via INTRAVENOUS

## 2018-11-30 MED ORDER — ONDANSETRON HCL 4 MG/2ML IJ SOLN
4.0000 mg | Freq: Once | INTRAMUSCULAR | Status: AC
  Administered 2018-11-30: 4 mg via INTRAVENOUS
  Filled 2018-11-30: qty 2

## 2018-11-30 MED ORDER — HYDROCODONE-ACETAMINOPHEN 5-325 MG PO TABS: 1.0000 | ORAL_TABLET | Freq: Four times a day (QID) | ORAL | 0 refills | Status: DC | PRN

## 2018-11-30 MED ORDER — PROCHLORPERAZINE EDISYLATE 10 MG/2ML IJ SOLN
10.0000 mg | Freq: Once | INTRAMUSCULAR | Status: AC
  Administered 2018-11-30: 10 mg via INTRAVENOUS
  Filled 2018-11-30: qty 2

## 2018-11-30 MED ORDER — ONDANSETRON HCL 4 MG PO TABS: 4.0000 mg | ORAL_TABLET | Freq: Three times a day (TID) | ORAL | 0 refills | Status: DC | PRN

## 2018-11-30 MED ORDER — IOHEXOL 300 MG/ML  SOLN
100.0000 mL | Freq: Once | INTRAMUSCULAR | Status: AC | PRN
  Administered 2018-11-30: 100 mL via INTRAVENOUS

## 2018-11-30 MED ORDER — FENTANYL CITRATE (PF) 100 MCG/2ML IJ SOLN
50.0000 ug | Freq: Once | INTRAMUSCULAR | Status: AC
  Administered 2018-11-30: 50 ug via INTRAVENOUS
  Filled 2018-11-30: qty 2

## 2018-11-30 NOTE — ED Provider Notes (Signed)
[facility] EMERGENCY DEPARTMENT Provider Note   CSN: [id number] Arrival date & time: 11/30/18  1134    History   Chief Complaint Chief Complaint  Patient presents with   Abdominal Pain    HPI Kendra Gonzalez is a 48 y.o. female.     The history is provided by the patient.  Abdominal Pain Pain location:  Epigastric Pain quality: aching   Pain radiates to:  Does not radiate Pain severity:  Mild Onset quality:  Sudden Timing:  Constant Progression:  Unchanged Chronicity:  New Context: not previous surgeries   Relieved by:  Nothing Worsened by:  Nothing Associated symptoms: nausea and shortness of breath   Associated symptoms: no chest pain, no chills, no cough, no dysuria, no fever, no hematuria, no sore throat and no vomiting   Risk factors: no alcohol abuse, has not had multiple surgeries and no NSAID use     Past Medical History:  Diagnosis Date   Allergy    Arthritis    Asthma    Endometriosis    GERD (gastroesophageal reflux disease)    History of tetanus, diphtheria, and acellular pertussis booster vaccination (Tdap)    07/30/2010   Hyperlipidemia    Kidney stones    Pancreatitis     Patient Active Problem List   Diagnosis Date Noted   Fever blister 08/10/2018   Bacterial vaginosis 08/02/2018   Calcific tendinitis of left hip 05/30/2018   Presence of left artificial hip joint 07/09/2017   Hyperthyroidism 11/03/2016   Multinodular goiter 04/17/2015   History of stomach ulcers 03/22/2015   Chronic pain of left heel 04/23/2014   Chronic pain syndrome 03/19/2014   Asthma 01/04/2013   Abdominal pain, chronic, right upper quadrant 06/23/2012   BMI 29.0-29.9,adult 10/12/2011   Paroxysmal nerve pain 01/22/2011   Trochanteric bursitis of left hip 01/22/2011   Hyperlipidemia 02/16/2007   ALLERGIC RHINITIS 02/16/2007   GERD 02/16/2007    Past Surgical History:  Procedure Laterality Date   ABDOMINAL  HYSTERECTOMY     arthroscopic knee surgery      carpel tunnel     CHOLECYSTECTOMY     ERCP  01/08/2012   Procedure: ENDOSCOPIC RETROGRADE CHOLANGIOPANCREATOGRAPHY (ERCP);  Surgeon: Beryle Beams, MD;  Location: [facility] ENDOSCOPY;  Service: Endoscopy;  Laterality: N/A;   HIP SURGERY     oophrectomy  2011   secondary to cysts   TUBAL LIGATION       OB History   No obstetric history on file.      Home Medications    Prior to Admission medications   Medication Sig Start Date End Date Taking? Authorizing Provider  albuterol (PROVENTIL HFA;VENTOLIN HFA) 108 (90 Base) MCG/ACT inhaler Inhale 2 puffs into the lungs every 6 (six) hours as needed for wheezing or shortness of breath. 04/02/17   Harrison Mons, PA  albuterol (PROVENTIL) (2.5 MG/3ML) 0.083% nebulizer solution Take 3 mLs (2.5 mg total) by nebulization every 6 (six) hours as needed for wheezing or shortness of breath. 04/10/17   Jaynee Eagles, PA-C  Boric Acid POWD Place 600 mg vaginally as directed. Place 600 mg capsule intravaginally once a week as needed for irritation: Boric acid suppository 08/02/18   Horald Pollen, MD  diclofenac sodium (VOLTAREN) 1 % GEL Apply up to three times a day as needed to large joint. 07/18/18   Garald Balding, MD  fluticasone-salmeterol (ADVAIR HFA) [phone] MCG/ACT inhaler Inhale 2 puffs into the lungs 2 (two) times daily. 04/02/17  Harrison Mons, PA  gabapentin (NEURONTIN) 300 MG capsule Take 1 capsule (300 mg total) by mouth 3 (three) times daily. 10/21/17   Weber, Damaris Hippo, PA-C  HYDROcodone-acetaminophen (NORCO/VICODIN) 5-325 MG tablet Take 1 tablet by mouth every 6 (six) hours as needed for up to 15 doses. 11/30/18   Alysiana Ethridge, DO  indomethacin (INDOCIN) 50 MG capsule Take 1 capsule (50 mg total) by mouth 2 (two) times daily with a meal. 11/03/18   Wieters, Hallie C, PA-C  levocetirizine (XYZAL) 5 MG tablet Take 1 tablet (5 mg total) by mouth every evening. 09/16/17   Gale Journey, Damaris Hippo, PA-C    magnesium gluconate (MAGONATE) 500 MG tablet Take 500 mg by mouth at bedtime.    [provider]  Multiple Vitamins-Minerals (MULTIVITAMIN WITH MINERALS) tablet Take 1 tablet by mouth daily.    [provider]  ondansetron (ZOFRAN) 4 MG tablet Take 1 tablet (4 mg total) by mouth every 8 (eight) hours as needed for up to 12 doses for nausea or vomiting. 11/30/18   Lancelot Alyea, DO  tamsulosin (FLOMAX) 0.4 MG CAPS capsule Take 1 capsule (0.4 mg total) by mouth daily. 11/03/18   Wieters, Hallie C, PA-C  valACYclovir (VALTREX) 1000 MG tablet Take 2 po twice today 08/10/18   Maryruth Hancock, MD  venlafaxine XR (EFFEXOR XR) 37.5 MG 24 hr capsule Take 1 capsule (37.5 mg total) by mouth daily with breakfast. 10/21/17   Gale Journey, Damaris Hippo, PA-C  VOLTAREN 1 % GEL  09/13/15   [provider]  pantoprazole (PROTONIX) 40 MG tablet Take 1 tablet (40 mg total) by mouth daily. 09/16/17 11/03/18  Mancel Bale, PA-C    Family History Family History  Problem Relation Age of Onset   Heart disease Daughter    Hypertension Father    Hypertension Sister    Hypertension Brother    Hypertension Maternal Grandfather    Heart disease Maternal Grandfather    Hypertension Paternal Grandfather    Hypertension Brother    Hypertension Sister    Thyroid disease Neg Hx     Social History Social History   Tobacco Use   Smoking status: Never Smoker   Smokeless tobacco: Never Used  Substance Use Topics   Alcohol use: No   Drug use: No     Allergies   Hydromorphone   Review of Systems Review of Systems  Constitutional: Positive for diaphoresis. Negative for chills and fever.  HENT: Negative for ear pain and sore throat.   Eyes: Negative for pain and visual disturbance.  Respiratory: Positive for shortness of breath. Negative for cough.   Cardiovascular: Negative for chest pain and palpitations.  Gastrointestinal: Positive for abdominal pain and nausea. Negative for vomiting.   Genitourinary: Negative for dysuria and hematuria.  Musculoskeletal: Negative for arthralgias and back pain.  Skin: Negative for color change and rash.  Neurological: Positive for weakness and light-headedness. Negative for seizures and syncope.  All other systems reviewed and are negative.    Physical Exam Updated Vital Signs  ED Triage Vitals  Enc Vitals Group     BP 11/30/18 1142 115/67     Pulse Rate 11/30/18 1142 75     Resp 11/30/18 1142 (!) 24     Temp 11/30/18 1142 98.1 F (36.7 C)     Temp Source 11/30/18 1142 Oral     SpO2 11/30/18 1142 97 %     Weight 11/30/18 1145 190 lb (86.2 kg)     Height 11/30/18 1145  5\' 4"  (1.626 m)     Head Circumference --      Peak Flow --      Pain Score 11/30/18 1144 8     Pain Loc --      Pain Edu? --      Excl. in [place]? --     Physical Exam Vitals signs and nursing note reviewed.  Constitutional:      General: She is not in acute distress.    Appearance: She is well-developed. She is ill-appearing.  HENT:     Head: Normocephalic and atraumatic.  Eyes:     Extraocular Movements: Extraocular movements intact.     Conjunctiva/sclera: Conjunctivae normal.     Pupils: Pupils are equal, round, and reactive to light.  Neck:     Musculoskeletal: Normal range of motion and neck supple.  Cardiovascular:     Rate and Rhythm: Normal rate and regular rhythm.     Pulses: Normal pulses.     Heart sounds: Normal heart sounds. No murmur.  Pulmonary:     Effort: Pulmonary effort is normal. No respiratory distress.     Breath sounds: Normal breath sounds. No decreased breath sounds, rhonchi or rales.  Abdominal:     Palpations: Abdomen is soft.     Tenderness: There is no abdominal tenderness.  Skin:    General: Skin is warm and dry.     Capillary Refill: Capillary refill takes less than 2 seconds.  Neurological:     General: No focal deficit present.     Mental Status: She is alert and oriented to person, place, and time.     Cranial  Nerves: No cranial nerve deficit.     Motor: No weakness.  Psychiatric:        Mood and Affect: Mood normal.      ED Treatments / Results  Labs (all labs ordered are listed, but only abnormal results are displayed) Labs Reviewed  CBC WITH DIFFERENTIAL/PLATELET - Abnormal; Notable for the following components:      Result Value   WBC 11.6 (*)    Lymphs Abs 5.6 (*)    All other components within normal limits  COMPREHENSIVE METABOLIC PANEL - Abnormal; Notable for the following components:   Potassium 3.0 (*)    CO2 21 (*)    Glucose, Bld 134 (*)    Creatinine, Ser 1.05 (*)    All other components within normal limits  URINALYSIS, ROUTINE W REFLEX MICROSCOPIC - Abnormal; Notable for the following components:   Hgb urine dipstick SMALL (*)    Bacteria, UA RARE (*)    All other components within normal limits  LIPASE, BLOOD  D-DIMER, QUANTITATIVE (NOT AT [facility])  RAPID URINE DRUG SCREEN, HOSP PERFORMED  TROPONIN I (HIGH SENSITIVITY)  TROPONIN I (HIGH SENSITIVITY)    EKG EKG Interpretation  Date/Time:  Wednesday November 30 2018 12:02:55 EDT Ventricular Rate:  72 PR Interval:    QRS Duration: 89 QT Interval:  405 QTC Calculation: 444 R Axis:   66 Text Interpretation:  Sinus rhythm Consider left ventricular hypertrophy Baseline wander in lead(s) V5 Confirmed by Lennice Sites [phone]) on 11/30/2018 12:05:13 PM   Radiology Ct Abdomen Pelvis W Contrast  Result Date: 11/30/2018 CLINICAL DATA:  Abdominal distension, upper abdominal pain EXAM: CT ABDOMEN AND PELVIS WITH CONTRAST TECHNIQUE: Multidetector CT imaging of the abdomen and pelvis was performed using the standard protocol following bolus administration of intravenous contrast. CONTRAST:  100 mL Omnipaque 300 iodinated contrast IV COMPARISON:  05/02/2016 FINDINGS: Lower chest: No acute abnormality. Hepatobiliary: No focal liver abnormality is seen. Status post cholecystectomy. Postoperative biliary dilatation. Pancreas:  Unremarkable. No pancreatic ductal dilatation or surrounding inflammatory changes. Spleen: Normal in size without significant abnormality. Adrenals/Urinary Tract: Adrenal glands are unremarkable. There is a 2-3 mm calculus in the proximal third of the right ureter without significant hydronephrosis (series 3, image 49). Bladder is unremarkable. Stomach/Bowel: Stomach is within normal limits. Appendix appears normal. No evidence of bowel wall thickening, distention, or inflammatory changes. The colon is fluid-filled to the rectum. Vascular/Lymphatic: No significant vascular findings are present. No enlarged abdominal or pelvic lymph nodes. Reproductive: Status post hysterectomy. Other: No abdominal wall hernia or abnormality. No abdominopelvic ascites. Musculoskeletal: No acute or significant osseous findings. Status post left hip total arthroplasty. IMPRESSION: 1. There is a 2-3 mm calculus in the proximal third of the right ureter without significant hydronephrosis (series 3, image 49). 2. The colon is fluid-filled to the rectum, in keeping with diarrheal illness. 3. Other chronic, incidental, and postoperative findings as detailed above. Electronically Signed   By: Eddie Candle M.D.   On: 11/30/2018 17:03   Dg Chest Portable 1 View  Result Date: 11/30/2018 CLINICAL DATA:  Abdominal pain. EXAM: PORTABLE CHEST 1 VIEW COMPARISON:  06/23/2014. FINDINGS: Mediastinum and hilar structures normal. Lungs are clear. No pleural effusion or pneumothorax. Heart size normal. No acute bony abnormality. IMPRESSION: No acute cardiopulmonary disease. Electronically Signed   By: Marcello Moores  Register   On: 11/30/2018 12:29    Procedures Procedures (including critical care time)  Medications Ordered in ED Medications  sodium chloride 0.9 % bolus 1,000 mL (0 mLs Intravenous Stopped 11/30/18 1537)  ondansetron (ZOFRAN) injection 4 mg (4 mg Intravenous Given 11/30/18 1152)  fentaNYL (SUBLIMAZE) injection 50 mcg (50 mcg Intravenous  Given 11/30/18 1246)  prochlorperazine (COMPAZINE) injection 10 mg (10 mg Intravenous Given 11/30/18 1300)  sodium chloride 0.9 % bolus 1,000 mL (0 mLs Intravenous Stopped 11/30/18 1725)  potassium chloride 10 mEq in 100 mL IVPB (0 mEq Intravenous Stopped 11/30/18 1537)  iohexol (OMNIPAQUE) 300 MG/ML solution 100 mL (100 mLs Intravenous Contrast Given 11/30/18 1652)     Initial Impression / Assessment and Plan / ED Course  I have reviewed the triage vital signs and the nursing notes.  Pertinent labs & imaging results that were available during my care of the patient were reviewed by me and considered in my medical decision making (see chart for details).     Kendra Gonzalez is a 48 year old female with history of kidney stones, ulcers who presents to the ED with abdominal pain, nausea.  Patient with normal vitals.  No fever.  Patient developed some acute nausea, upper abdominal pain, sweats while at a store.  She denies any chest pain.  Felt a [place] bit hard to catch her breath.  Overall does not appear to have any focal tenderness on abdominal exam.  No cardiac history.  No DVT or PE risk factors.  Patient originally was called a code STEMI which was deactivated by cardiology as EKG shows no ischemic changes.  Repeat EKG here shows sinus rhythm, no signs of ischemic changes.  Overall atypical story for ACS but will get troponins, d-dimer, labs to evaluate for shortness of breath, abdominal pain.  Will give IV fluids, IV Zofran.  Possibly GI viral illness versus pancreatitis versus PE.  Will reevaluate after IV fluids and medicines and lab work.  Troponins normal.  D-dimer normal.  Doubt cardiac etiology of  pain.  Patient has had nausea now some diarrhea and now right flank pain.  Appears that she has a kidney stone and possibly GI illness.  Otherwise lab work is unremarkable.  Felt better after multiple rounds IV fluids.  Patient given IV potassium.  No signs of hydronephrosis.  No kidney infection.   Overall symptoms likely secondary to kidney stone.  Given return precautions.  Given prescription for narcotic pain medicine and Zofran.  She does not have any active narcotic scripts.  Discharged in good condition.  This chart was dictated using voice recognition software.  Despite best efforts to proofread,  errors can occur which can change the documentation meaning.    Final Clinical Impressions(s) / ED Diagnoses   Final diagnoses:  Kidney stone    ED Discharge Orders         Ordered    HYDROcodone-acetaminophen (NORCO/VICODIN) 5-325 MG tablet  Every 6 hours PRN     11/30/18 1734    ondansetron (ZOFRAN) 4 MG tablet  Every 8 hours PRN     11/30/18 [place], Aja Whitehair, DO 11/30/18 1739

## 2018-11-30 NOTE — ED Triage Notes (Signed)
Pt brought [facility] Pt was at a store and started having upper abdominal pain, diaphretic, nauseated, sob.  Per ems pt 12 lead unremarkable. When pt talks she talks in short sentences and she's having SOB. Pt NSR on monitor.

## 2018-11-30 NOTE — ED Notes (Signed)
Pt had a large loose stool

## 2018-12-21 ENCOUNTER — Other Ambulatory Visit: Payer: Self-pay

## 2018-12-21 ENCOUNTER — Ambulatory Visit: Payer: 59 | Admitting: Emergency Medicine

## 2018-12-21 ENCOUNTER — Encounter: Payer: Self-pay | Admitting: Emergency Medicine

## 2018-12-21 VITALS — BP 125/73 | HR 88 | Temp 99.1°F | Ht 64.0 in | Wt 188.8 lb

## 2018-12-21 DIAGNOSIS — I517 Cardiomegaly: Secondary | ICD-10-CM

## 2018-12-21 DIAGNOSIS — R002 Palpitations: Secondary | ICD-10-CM | POA: Diagnosis not present

## 2018-12-21 NOTE — Patient Instructions (Addendum)
     If you have lab work done today you will be contacted with your lab results within the next 2 weeks.  If you have not heard from [place] then please contact us. The fastest way to get your results is to register for My Chart.   IF you received an x-ray today, you will receive an invoice from [facility] Radiology. Please contact [facility] Radiology at [phone] with questions or concerns regarding your invoice.   IF you received labwork today, you will receive an invoice from [facility]. Please contact [facility] at [phone] with questions or concerns regarding your invoice.   Our billing staff will not be able to assist you with questions regarding bills from these companies.  You will be contacted with the lab results as soon as they are available. The fastest way to get your results is to activate your My Chart account. Instructions are located on the last page of this paperwork. If you have not heard from [place] regarding the results in 2 weeks, please contact this office.      Palpitations Palpitations are feelings that your heartbeat is not normal. Your heartbeat may feel like it is:  Uneven.  Faster than normal.  Fluttering.  Skipping a beat. This is usually not a serious problem. In some cases, you may need tests to rule out any serious problems. Follow these instructions at home: Pay attention to any changes in your condition. Take these actions to help manage your symptoms: Eating and drinking  Avoid: ? Coffee, tea, soft drinks, and energy drinks. ? Chocolate. ? Alcohol. ? Diet pills. Lifestyle   Try to lower your stress. These things can help you relax: ? Yoga. ? Deep breathing and meditation. ? Exercise. ? Using words and images to create positive thoughts (guided imagery). ? Using your mind to control things in your body (biofeedback).  Do not use drugs.  Get plenty of rest and sleep. Keep a regular bed time. General instructions   Take  over-the-counter and prescription medicines only as told by your doctor.  Do not use any products that contain nicotine or tobacco, such as cigarettes and e-cigarettes. If you need help quitting, ask your doctor.  Keep all follow-up visits as told by your doctor. This is important. You may need more tests if palpitations do not go away or get worse. Contact a doctor if:  Your symptoms last more than 24 hours.  Your symptoms occur more often. Get help right away if you:  Have chest pain.  Feel short of breath.  Have a very bad headache.  Feel dizzy.  Pass out (faint). Summary  Palpitations are feelings that your heartbeat is uneven or faster than normal. It may feel like your heart is fluttering or skipping a beat.  Avoid food and drinks that may cause palpitations. These include caffeine, chocolate, and alcohol.  Try to lower your stress. Do not smoke or use drugs.  Get help right away if you faint or have chest pain, shortness of breath, a severe headache, or dizziness. This information is not intended to replace advice given to you by your health care provider. Make sure you discuss any questions you have with your health care provider. Document Released: 01/21/2008 Document Revised: 05/26/2017 Document Reviewed: 05/26/2017 Elsevier Patient Education  2020 [employer].

## 2018-12-21 NOTE — Progress Notes (Signed)
Kendra Gonzalez 48 y.o.   Chief Complaint  Patient presents with  . Abnormal ECG    Had it done in hospital on 11/30/18. Wants to talk about it    HISTORY OF PRESENT ILLNESS: This is a 48 y.o. female here for follow-up of emergency room visit on 11/30/2018.  She presented with epigastric pain.  Staff was initially concerned about possible cardiac etiology but it was not.  EKG showed sinus rhythm with LVH.  Patient is doing well now.  Work-up was negative but CT scan of abdomen and pelvis showed a right-sided 2 to 3 mm ureter stone without hydronephrosis.  Final diagnosis was possible kidney stone but patient states it did not feel like a kidney stone.  No history of hypertension or diabetes.  Patient has been complaining of occasional palpitations and "fluttering in my chest" for the past 5 to 6 months.  Denies chest pain or dyspnea on exertion.  No other significant symptoms.  HPI   Prior to Admission medications   Medication Sig Start Date End Date Taking? Authorizing Provider  albuterol (PROVENTIL HFA;VENTOLIN HFA) 108 (90 Base) MCG/ACT inhaler Inhale 2 puffs into the lungs every 6 (six) hours as needed for wheezing or shortness of breath. 04/02/17  Yes Jeffery, Chelle, PA  albuterol (PROVENTIL) (2.5 MG/3ML) 0.083% nebulizer solution Take 3 mLs (2.5 mg total) by nebulization every 6 (six) hours as needed for wheezing or shortness of breath. 04/10/17  Yes Jaynee Eagles, PA-C  Boric Acid POWD Place 600 mg vaginally as directed. Place 600 mg capsule intravaginally once a week as needed for irritation: Boric acid suppository 08/02/18  Yes Kiriana Worthington, Ines Bloomer, MD  diclofenac sodium (VOLTAREN) 1 % GEL Apply up to three times a day as needed to large joint. 07/18/18  Yes Garald Balding, MD  fluticasone-salmeterol (ADVAIR HFA) [id number] MCG/ACT inhaler Inhale 2 puffs into the lungs 2 (two) times daily. 04/02/17  Yes Jeffery, Chelle, PA  gabapentin (NEURONTIN) 300 MG capsule Take 1 capsule (300 mg total)  by mouth 3 (three) times daily. 10/21/17  Yes Weber, Damaris Hippo, PA-C  HYDROcodone-acetaminophen (NORCO/VICODIN) 5-325 MG tablet Take 1 tablet by mouth every 6 (six) hours as needed for up to 15 doses. 11/30/18  Yes Curatolo, Adam, DO  magnesium gluconate (MAGONATE) 500 MG tablet Take 500 mg by mouth at bedtime.   Yes [provider]  Multiple Vitamins-Minerals (MULTIVITAMIN WITH MINERALS) tablet Take 1 tablet by mouth daily.   Yes [provider]  tamsulosin (FLOMAX) 0.4 MG CAPS capsule Take 1 capsule (0.4 mg total) by mouth daily. 11/03/18  Yes Wieters, Hallie C, PA-C  indomethacin (INDOCIN) 50 MG capsule Take 1 capsule (50 mg total) by mouth 2 (two) times daily with a meal. Patient not taking: Reported on 12/21/2018 11/03/18   Wieters, Hallie C, PA-C  levocetirizine (XYZAL) 5 MG tablet Take 1 tablet (5 mg total) by mouth every evening. Patient not taking: Reported on 12/21/2018 09/16/17   Mancel Bale, PA-C  ondansetron (ZOFRAN) 4 MG tablet Take 1 tablet (4 mg total) by mouth every 8 (eight) hours as needed for up to 12 doses for nausea or vomiting. Patient not taking: Reported on 12/21/2018 11/30/18   Lennice Sites, DO  venlafaxine XR (EFFEXOR XR) 37.5 MG 24 hr capsule Take 1 capsule (37.5 mg total) by mouth daily with breakfast. Patient not taking: Reported on 12/21/2018 10/21/17   Mancel Bale, PA-C  VOLTAREN 1 % GEL  09/13/15   [provider]  pantoprazole (PROTONIX) 40 MG tablet Take 1 tablet (40 mg total) by mouth daily. 09/16/17 11/03/18  Gale Journey, Damaris Hippo, PA-C    Allergies  Allergen Reactions  . Hydromorphone Itching    Patient Active Problem List   Diagnosis Date Noted  . Fever blister 08/10/2018  . Bacterial vaginosis 08/02/2018  . Calcific tendinitis of left hip 05/30/2018  . Presence of left artificial hip joint 07/09/2017  . Hyperthyroidism 11/03/2016  . Multinodular goiter 04/17/2015  . History of stomach ulcers 03/22/2015  . Chronic pain of left heel  04/23/2014  . Chronic pain syndrome 03/19/2014  . Asthma 01/04/2013  . Abdominal pain, chronic, right upper quadrant 06/23/2012  . BMI 29.0-29.9,adult 10/12/2011  . 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 Trochanteric bursitis of left hip 01/22/2011  . Hyperlipidemia 02/16/2007  . ALLERGIC RHINITIS 02/16/2007  . GERD 02/16/2007    Past Medical History:  Diagnosis Date  . Allergy   . Arthritis   . Asthma   . Endometriosis   . GERD (gastroesophageal reflux disease)   . History of tetanus, diphtheria, and acellular pertussis booster vaccination (Tdap)    07/30/2010  . Hyperlipidemia   . Kidney stones   . Pancreatitis     Past Surgical History:  Procedure Laterality Date  . ABDOMINAL HYSTERECTOMY    . arthroscopic knee surgery     . carpel tunnel    . CHOLECYSTECTOMY    . ERCP  01/08/2012   Procedure: ENDOSCOPIC RETROGRADE CHOLANGIOPANCREATOGRAPHY (ERCP);  Surgeon: Beryle Beams, MD;  Location: [facility];  Service: Endoscopy;  Laterality: N/A;  . HIP SURGERY    . oophrectomy  2011   secondary to cysts  . TUBAL LIGATION      Social History   Socioeconomic History  . Marital status: Married    Spouse name: Fritz Pickerel  . Number of children: 2  . Years of education: Not on file  . Highest education level: Not on file  Occupational History  . Occupation: Nursing Aid    Employer: [employer]  Social Needs  . Financial resource strain: Not on file  . Food insecurity    Worry: Not on file    Inability: Not on file  . Transportation needs    Medical: Not on file    Non-medical: Not on file  Tobacco Use  . Smoking status: Never Smoker  . Smokeless tobacco: Never Used  Substance and Sexual Activity  . Alcohol use: No  . Drug use: No  . Sexual activity: Yes    Birth control/protection: None  Lifestyle  . Physical activity    Days per week: 5 days    Minutes per session: 150+ min  . Stress: Not on file  Relationships  . Social Herbalist on phone: Not on  file    Gets together: Not on file    Attends religious service: Not on file    Active member of club or organization: Not on file    Attends meetings of clubs or organizations: Not on file    Relationship status: Not on file  . Intimate partner violence    Fear of current or ex partner: Not on file    Emotionally abused: Not on file    Physically abused: Not on file    Forced sexual activity: Not on file  Other Topics Concern  . Not on file  Social History Narrative   Works as a Education officer, environmental.     Lives with  her husband.   Adult children live in [place], [place] and [place].    Family History  Problem Relation Age of Onset  . Heart disease Daughter   . Hypertension Father   . Hypertension Sister   . Hypertension Brother   . Hypertension Maternal Grandfather   . Heart disease Maternal Grandfather   . Hypertension Paternal Grandfather   . Hypertension Brother   . Hypertension Sister   . Thyroid disease Neg Hx      Review of Systems  Constitutional: Negative.  Negative for chills and fever.  HENT: Negative.  Negative for congestion and sore throat.   Eyes: Negative.   Respiratory: Negative.  Negative for cough and shortness of breath.   Cardiovascular: Positive for palpitations. Negative for chest pain.  Gastrointestinal: Negative.  Negative for abdominal pain, diarrhea, nausea and vomiting.  Genitourinary: Negative.   Musculoskeletal: Negative for myalgias and neck pain.  Skin: Negative.  Negative for rash.  Neurological: Negative for dizziness and headaches.  Endo/Heme/Allergies: Negative.   All other systems reviewed and are negative.  Today's Vitals   12/21/18 1348  BP: 125/73  Pulse: 88  Temp: 99.1 F (37.3 C)  TempSrc: Oral  SpO2: 99%  Weight: 188 lb 12.8 oz (85.6 kg)  Height: 5\' 4"  (1.626 m)   Body mass index is 32.41 kg/m.   Physical Exam Vitals signs reviewed.  Constitutional:      Appearance: Normal appearance.  HENT:     Head:  Normocephalic and atraumatic.  Eyes:     Extraocular Movements: Extraocular movements intact.     Conjunctiva/sclera: Conjunctivae normal.     Pupils: Pupils are equal, round, and reactive to light.  Neck:     Musculoskeletal: Normal range of motion and neck supple.  Cardiovascular:     Rate and Rhythm: Normal rate and regular rhythm.     Pulses: Normal pulses.     Heart sounds: Normal heart sounds.  Pulmonary:     Effort: Pulmonary effort is normal.     Breath sounds: Normal breath sounds.  Abdominal:     Palpations: Abdomen is soft.     Tenderness: There is no abdominal tenderness.  Musculoskeletal: Normal range of motion.  Skin:    General: Skin is warm and dry.     Capillary Refill: Capillary refill takes less than 2 seconds.  Neurological:     General: No focal deficit present.     Mental Status: She is alert and oriented to person, place, and time.  Psychiatric:        Mood and Affect: Mood normal.        Behavior: Behavior normal.    A total of 25 minutes was spent in the room with the patient, greater than 50% of which was in counseling/coordination of care regarding differential diagnosis, review of labs, imaging, and EKG from ER visit, need for cardiology evaluation, prognosis and need for follow-up..   ASSESSMENT & PLAN: Leilanie was seen today for abnormal ecg.  Diagnoses and all orders for this visit:  LVH (left ventricular hypertrophy) -     Ambulatory referral to Cardiology  Palpitations -     Ambulatory referral to Cardiology    Patient Instructions       If you have lab work done today you will be contacted with your lab results within the next 2 weeks.  If you have not heard from [place] then please contact us. The fastest way to get your results is to register for My  Chart.   IF you received an x-ray today, you will receive an invoice from [facility]. Please contact [facility] at [phone] with questions or concerns regarding  your invoice.   IF you received labwork today, you will receive an invoice from [facility]. Please contact [facility] at [phone] with questions or concerns regarding your invoice.   Our billing staff will not be able to assist you with questions regarding bills from these companies.  You will be contacted with the lab results as soon as they are available. The fastest way to get your results is to activate your My Chart account. Instructions are located on the last page of this paperwork. If you have not heard from [place] regarding the results in 2 weeks, please contact this office.      Palpitations Palpitations are feelings that your heartbeat is not normal. Your heartbeat may feel like it is:  Uneven.  Faster than normal.  Fluttering.  Skipping a beat. This is usually not a serious problem. In some cases, you may need tests to rule out any serious problems. Follow these instructions at home: Pay attention to any changes in your condition. Take these actions to help manage your symptoms: Eating and drinking  Avoid: ? Coffee, tea, soft drinks, and energy drinks. ? Chocolate. ? Alcohol. ? Diet pills. Lifestyle   Try to lower your stress. These things can help you relax: ? Yoga. ? Deep breathing and meditation. ? Exercise. ? Using words and images to create positive thoughts (guided imagery). ? Using your mind to control things in your body (biofeedback).  Do not use drugs.  Get plenty of rest and sleep. Keep a regular bed time. General instructions   Take over-the-counter and prescription medicines only as told by your doctor.  Do not use any products that contain nicotine or tobacco, such as cigarettes and e-cigarettes. If you need help quitting, ask your doctor.  Keep all follow-up visits as told by your doctor. This is important. You may need more tests if palpitations do not go away or get worse. Contact a doctor if:  Your symptoms last more than 24 hours.   Your symptoms occur more often. Get help right away if you:  Have chest pain.  Feel short of breath.  Have a very bad headache.  Feel dizzy.  Pass out (faint). Summary  Palpitations are feelings that your heartbeat is uneven or faster than normal. It may feel like your heart is fluttering or skipping a beat.  Avoid food and drinks that may cause palpitations. These include caffeine, chocolate, and alcohol.  Try to lower your stress. Do not smoke or use drugs.  Get help right away if you faint or have chest pain, shortness of breath, a severe headache, or dizziness. This information is not intended to replace advice given to you by your health care provider. Make sure you discuss any questions you have with your health care provider. Document Released: 01/21/2008 Document Revised: 05/26/2017 Document Reviewed: 05/26/2017 Elsevier Patient Education  2020 Elsevier Inc.      Agustina Caroli, MD [facility]

## 2018-12-27 DIAGNOSIS — R002 Palpitations: Secondary | ICD-10-CM | POA: Insufficient documentation

## 2018-12-27 NOTE — Progress Notes (Signed)
Patient referred by Kendra Gonzalez, * for palpitations  Subjective:   Kendra Gonzalez, female    DOB: 1971-02-24, 48 y.o.   MRN: [id number]   Chief Complaint  Patient presents with  . Palpitations  . New Patient (Initial Visit)    HPI  48 y.o. African American female with palpitations.  Patient works as Quarry manager at [employer]. She has experienced palpitations that last for a few seconds to minutes and occur occasionally-once a month. Recently, patient was in the emergency department with epigastric pain. Workup was unremarkable from cardiac standpoint. She was found to have a kidney stone. Patient was worried about finding of LVH on the EKG.  She exercises regularly, without any complaints of chest pain, shortness of breath, palpitations, leg edema, orthopnea, PND, TIA/syncope. WHule she has strong family h.o heart failure, she denies any heart failure symptoms.   Past Medical History:  Diagnosis Date  . Allergy   . Arthritis   . Asthma   . Endometriosis   . GERD (gastroesophageal reflux disease)   . History of tetanus, diphtheria, and acellular pertussis booster vaccination (Tdap)    07/30/2010  . Hyperlipidemia   . Kidney stones   . Pancreatitis      Past Surgical History:  Procedure Laterality Date  . ABDOMINAL HYSTERECTOMY    . arthroscopic knee surgery     . carpel tunnel    . CHOLECYSTECTOMY    . ERCP  01/08/2012   Procedure: ENDOSCOPIC RETROGRADE CHOLANGIOPANCREATOGRAPHY (ERCP);  Surgeon: Kendra Beams, MD;  Location: [facility] ENDOSCOPY;  Service: Endoscopy;  Laterality: N/A;  . HIP SURGERY    . oophrectomy  2011   secondary to cysts  . TUBAL LIGATION       Social History   Socioeconomic History  . Marital status: Married    Spouse name: Kendra Gonzalez  . Number of children: 2  . Years of education: Not on file  . Highest education level: Not on file  Occupational History  . Occupation: Nursing Aid    Employer: [employer]  Social Needs  .  Financial resource strain: Not on file  . Food insecurity    Worry: Not on file    Inability: Not on file  . Transportation needs    Medical: Not on file    Non-medical: Not on file  Tobacco Use  . Smoking status: Never Smoker  . Smokeless tobacco: Never Used  Substance and Sexual Activity  . Alcohol use: No  . Drug use: No  . Sexual activity: Yes    Birth control/protection: None  Lifestyle  . Physical activity    Days per week: 5 days    Minutes per session: 150+ min  . Stress: Not on file  Relationships  . Social Herbalist on phone: Not on file    Gets together: Not on file    Attends religious service: Not on file    Active member of club or organization: Not on file    Attends meetings of clubs or organizations: Not on file    Relationship status: Not on file  . Intimate partner violence    Fear of current or ex partner: Not on file    Emotionally abused: Not on file    Physically abused: Not on file    Forced sexual activity: Not on file  Other Topics Concern  . Not on file  Social History Narrative   Works as a Education officer, environmental.  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   Adult children live in [place], [place] and [place].     Family History  Problem Relation Age of Onset  . Heart disease Daughter   . Hypertension Father   . Hypertension Sister   . Hypertension Brother   . Hypertension Maternal Grandfather   . Heart disease Maternal Grandfather   . Hypertension Paternal Grandfather   . Hypertension Brother   . Hypertension Sister   . Thyroid disease Neg Hx      Current Outpatient Medications on File Prior to Visit  Medication Sig Dispense Refill  . albuterol (PROVENTIL HFA;VENTOLIN HFA) 108 (90 Base) MCG/ACT inhaler Inhale 2 puffs into the lungs every 6 (six) hours as needed for wheezing or shortness of breath. 1 Inhaler 6  . albuterol (PROVENTIL) (2.5 MG/3ML) 0.083% nebulizer solution Take 3 mLs (2.5 mg total) by nebulization every 6 (six) hours as  needed for wheezing or shortness of breath. 150 mL 1  . Boric Acid POWD Place 600 mg vaginally as directed. Place 600 mg capsule intravaginally once a week as needed for irritation: Boric acid suppository 113 g 0  . diclofenac sodium (VOLTAREN) 1 % GEL Apply up to three times a day as needed to large joint. 3 Tube 4  . fluticasone-salmeterol (ADVAIR HFA) 115-21 MCG/ACT inhaler Inhale 2 puffs into the lungs 2 (two) times daily. 1 Inhaler 11  . gabapentin (NEURONTIN) 300 MG capsule Take 1 capsule (300 mg total) by mouth 3 (three) times daily. 90 capsule 0  . HYDROcodone-acetaminophen (NORCO/VICODIN) 5-325 MG tablet Take 1 tablet by mouth every 6 (six) hours as needed for up to 15 doses. (Patient not taking: Reported on 12/27/2018) 15 tablet 0  . indomethacin (INDOCIN) 50 MG capsule Take 1 capsule (50 mg total) by mouth 2 (two) times daily with a meal. (Patient not taking: Reported on 12/21/2018) 20 capsule 0  . levocetirizine (XYZAL) 5 MG tablet Take 1 tablet (5 mg total) by mouth every evening. (Patient not taking: Reported on 12/21/2018) 90 tablet 4  . magnesium gluconate (MAGONATE) 500 MG tablet Take 500 mg by mouth at bedtime.    . Multiple Vitamins-Minerals (MULTIVITAMIN WITH MINERALS) tablet Take 1 tablet by mouth daily.    . ondansetron (ZOFRAN) 4 MG tablet Take 1 tablet (4 mg total) by mouth every 8 (eight) hours as needed for up to 12 doses for nausea or vomiting. (Patient not taking: Reported on 12/21/2018) 12 tablet 0  . tamsulosin (FLOMAX) 0.4 MG CAPS capsule Take 1 capsule (0.4 mg total) by mouth daily. 7 capsule 0  . venlafaxine XR (EFFEXOR XR) 37.5 MG 24 hr capsule Take 1 capsule (37.5 mg total) by mouth daily with breakfast. (Patient not taking: Reported on 12/21/2018) 90 capsule 1  . VOLTAREN 1 % GEL     . [DISCONTINUED] pantoprazole (PROTONIX) 40 MG tablet Take 1 tablet (40 mg total) by mouth daily. 90 tablet 4   No current facility-administered medications on file prior to visit.      Cardiovascular studies:  EKG 12/28/2018: Sinus rhythm 89 bpm. Normal EKG.   Recent labs: Results for Kendra, Gonzalez (MRN [id number]) as of 12/27/2018 21:00  Ref. Range 11/30/2018 11:41 11/30/2018 14:21  Sodium Latest Ref Range: 135 - 145 mmol/L 136   Potassium Latest Ref Range: 3.5 - 5.1 mmol/L 3.0 (L)   Chloride Latest Ref Range: 98 - 111 mmol/L 103   CO2 Latest Ref Range: 22 - 32 mmol/L 21 (L)   Glucose Latest Ref  Range: 70 - 99 mg/dL 134 (H)   BUN Latest Ref Range: 6 - 20 mg/dL 13   Creatinine Latest Ref Range: 0.44 - 1.00 mg/dL 1.05 (H)   Calcium Latest Ref Range: 8.9 - 10.3 mg/dL 9.6   Anion gap Latest Ref Range: 5 - 15  12   Alkaline Phosphatase Latest Ref Range: 38 - 126 U/L 80   Albumin Latest Ref Range: 3.5 - 5.0 g/dL 4.1   Lipase Latest Ref Range: 11 - 51 U/L 26   AST Latest Ref Range: 15 - 41 U/L 22   ALT Latest Ref Range: 0 - 44 U/L 16   Total Protein Latest Ref Range: 6.5 - 8.1 g/dL 7.6   Total Bilirubin Latest Ref Range: 0.3 - 1.2 mg/dL 0.9   GFR, Est Non African American Latest Ref Range: >60 mL/min >60   GFR, Est African American Latest Ref Range: >60 mL/min >60   Troponin I (High Sensitivity) Latest Ref Range: <18 ng/L 2 3   Results for KAYANA, FOLDEN (MRN [id number]) as of 12/27/2018 21:00  Ref. Range 11/30/2018 11:41  D-Dimer, Quant Latest Ref Range: 0.00 - 0.50 ug/mL-FEU 0.39    Results for EMBREY, LEGGITT (MRN [id number]) as of 12/27/2018 21:00  Ref. Range 11/30/2018 11:41  WBC Latest Ref Range: 4.0 - 10.5 K/uL 11.6 (H)  RBC Latest Ref Range: 3.87 - 5.11 MIL/uL 4.89  Hemoglobin Latest Ref Range: 12.0 - 15.0 g/dL 14.5  HCT Latest Ref Range: 36.0 - 46.0 % 42.5  MCV Latest Ref Range: 80.0 - 100.0 fL 86.9  MCH Latest Ref Range: 26.0 - 34.0 pg 29.7  MCHC Latest Ref Range: 30.0 - 36.0 g/dL 34.1  RDW Latest Ref Range: 11.5 - 15.5 % 12.8  Platelets Latest Ref Range: 150 - 400 K/uL 319  nRBC Latest Ref Range: 0.0 - 0.2 % 0.0   Results for IOANA, VERON (MRN  [id number]) as of 12/27/2018 21:00  Ref. Range 04/02/2017 08:44  Total CHOL/HDL Ratio Latest Ref Range: 0.0 - 4.4 ratio 2.7  Cholesterol, Total Latest Ref Range: 100 - 199 mg/dL 230 (H)  HDL Cholesterol Latest Ref Range: >39 mg/dL 84  LDL (calc) Latest Ref Range: 0 - 99 mg/dL 135 (H)  Triglycerides Latest Ref Range: 0 - 149 mg/dL 55  VLDL Cholesterol Cal Latest Ref Range: 5 - 40 mg/dL 11   Review of Systems  Constitution: Negative for decreased appetite, malaise/fatigue, weight gain and weight loss.  HENT: Negative for congestion.   Eyes: Negative for visual disturbance.  Cardiovascular: Positive for palpitations. Negative for chest pain, dyspnea on exertion, leg swelling and syncope.  Respiratory: Negative for cough.   Endocrine: Negative for cold intolerance.  Hematologic/Lymphatic: Does not bruise/bleed easily.  Skin: Negative for itching and rash.  Musculoskeletal: Negative for myalgias.  Gastrointestinal: Negative for abdominal pain, nausea and vomiting.  Genitourinary: Negative for dysuria.  Neurological: Negative for dizziness and weakness.  Psychiatric/Behavioral: The patient is not nervous/anxious.   All other systems reviewed and are negative.        Vitals:   12/28/18 1030  BP: 125/80  Pulse: 78  Temp: (!) 96.6 F (35.9 C)  SpO2: 100%     Body mass index is 31.93 kg/m. Filed Weights   12/28/18 1030  Weight: 186 lb (84.4 kg)     Objective:   Physical Exam  Constitutional: She is oriented to person, place, and time. She appears well-developed and well-nourished. No distress.  HENT:  Head: Normocephalic and  atraumatic.  Eyes: Pupils are equal, round, and reactive to light. Conjunctivae are normal.  Neck: No JVD present.  Cardiovascular: Normal rate, regular rhythm and intact distal pulses.  Pulmonary/Chest: Effort normal and breath sounds normal. She has no wheezes. She has no rales.  Abdominal: Soft. Bowel sounds are normal. There is no rebound.   Musculoskeletal:        General: No edema.  Lymphadenopathy:    She has no cervical adenopathy.  Neurological: She is alert and oriented to person, place, and time. No cranial nerve deficit.  Skin: Skin is warm and dry.  Psychiatric: She has a normal mood and affect.  Nursing note and vitals reviewed.         Assessment & Recommendations:   48 y.o. African American female with palpitations.  Palpitations: Occasional.Likely benign etiology. I have reviewed her EKG. I do not think this shows LVH. Give her strong family h/o heart failure, I will obtain echocardiogram to rule out structural abnormalities.   Thank you for referring the patient to [place]. Please feel free to contact with any questions.  Nigel Mormon, MD [facility] Pager: [phone] Office: [phone] If no answer Cell [phone]

## 2018-12-28 ENCOUNTER — Ambulatory Visit: Payer: 59 | Admitting: Cardiology

## 2018-12-28 ENCOUNTER — Encounter: Payer: Self-pay | Admitting: Cardiology

## 2018-12-28 ENCOUNTER — Other Ambulatory Visit: Payer: Self-pay

## 2018-12-28 VITALS — BP 125/80 | HR 78 | Temp 96.6°F | Ht 64.0 in | Wt 186.0 lb

## 2018-12-28 DIAGNOSIS — R002 Palpitations: Secondary | ICD-10-CM | POA: Diagnosis not present

## 2018-12-28 MED FILL — DICLOFENAC EPOLAMINE 1.3 %: 1.3 | 30 days supply | Qty: 60 | Fill #1

## 2019-01-10 MED FILL — DICLOFENAC EPOLAMINE 1.3 %: 1.3 | 30 days supply | Qty: 60 | Fill #1

## 2019-01-13 ENCOUNTER — Other Ambulatory Visit: Payer: 59

## 2019-01-31 ENCOUNTER — Other Ambulatory Visit: Payer: Self-pay | Admitting: Emergency Medicine

## 2019-01-31 DIAGNOSIS — Z1231 Encounter for screening mammogram for malignant neoplasm of breast: Secondary | ICD-10-CM

## 2019-02-05 ENCOUNTER — Ambulatory Visit
Admission: EM | Admit: 2019-02-05 | Discharge: 2019-02-05 | Disposition: A | Discharge status: Home / Self Care | Payer: 59

## 2019-02-05 ENCOUNTER — Other Ambulatory Visit: Payer: Self-pay

## 2019-02-05 DIAGNOSIS — K2900 Acute gastritis without bleeding: Secondary | ICD-10-CM

## 2019-02-05 MED ORDER — ALUM & MAG HYDROXIDE-SIMETH 200-200-20 MG/5ML PO SUSP
30.0000 mL | Freq: Once | ORAL | Status: AC
  Administered 2019-02-05: 30 mL via ORAL

## 2019-02-05 MED ORDER — OMEPRAZOLE 40 MG PO CPDR: 40.0000 mg | DELAYED_RELEASE_CAPSULE | Freq: Every day | ORAL | 1 refills | Status: DC

## 2019-02-05 MED ORDER — LIDOCAINE VISCOUS HCL 2 % MT SOLN
15.0000 mL | Freq: Once | OROMUCOSAL | Status: AC
  Administered 2019-02-05: 10:00:00 15 mL via ORAL

## 2019-02-05 MED ORDER — SUCRALFATE 1 G PO TABS: 1.0000 g | ORAL_TABLET | Freq: Four times a day (QID) | ORAL | 0 refills | Status: DC

## 2019-02-05 NOTE — ED Provider Notes (Signed)
[name]    CSN: [id number] Arrival date & time: 02/05/19  [id number]      History   Chief Complaint Chief Complaint  Patient presents with  . Abdominal Pain    HPI Kendra Gonzalez is a 48 y.o. female with history of allergies, arthritis, asthma, GERD presenting for epigastric pain since Friday.  Patient developed nausea this morning, prompting her to seek evaluation.  Patient denies heavy NSAID use: Primarily takes gabapentin, Tylenol for her chronic pain, "very rarely do I taken ibuprofen".  Denies increased NSAID use recently.  Denies emesis, hematochezia or melena.  Of note, patient did have an episode of pancreatitis in 2013: States this was postprocedural and denies alcohol use, history of hypertriglyceridemia.  Patient also reports history of peptic ulcers with positive H. pylori test, years ago.  Patient states this feels very similar.   Past Medical History:  Diagnosis Date  . Allergy   . Arthritis   . Asthma   . Endometriosis   . GERD (gastroesophageal reflux disease)   . History of tetanus, diphtheria, and acellular pertussis booster vaccination (Tdap)    07/30/2010  . Hyperlipidemia   . Kidney stones   . Pancreatitis     Patient Active Problem List   Diagnosis Date Noted  . Palpitations 12/27/2018  . Presence of left artificial hip joint 07/09/2017  . Hyperthyroidism 11/03/2016  . Multinodular goiter 04/17/2015  . History of stomach ulcers 03/22/2015  . Chronic pain of left heel 04/23/2014  . Chronic pain syndrome 03/19/2014  . Asthma 01/04/2013  . BMI 29.0-29.9,adult 10/12/2011  . 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 Hyperlipidemia 02/16/2007  . ALLERGIC RHINITIS 02/16/2007  . GERD 02/16/2007    Past Surgical History:  Procedure Laterality Date  . ABDOMINAL HYSTERECTOMY    . arthroscopic knee surgery     . carpel tunnel    . CHOLECYSTECTOMY    . ERCP  01/08/2012   Procedure: ENDOSCOPIC RETROGRADE CHOLANGIOPANCREATOGRAPHY (ERCP);   Surgeon: Beryle Beams, MD;  Location: [facility] ENDOSCOPY;  Service: Endoscopy;  Laterality: N/A;  . HIP SURGERY    . oophrectomy  2011   secondary to cysts  . TUBAL LIGATION      OB History   No obstetric history on file.      Home Medications    Prior to Admission medications   Medication Sig Start Date End Date Taking? Authorizing Provider  acetaminophen (TYLENOL) 500 MG tablet Take 500 mg by mouth every 6 (six) hours as needed.   Yes [provider]  albuterol (PROVENTIL HFA;VENTOLIN HFA) 108 (90 Base) MCG/ACT inhaler Inhale 2 puffs into the lungs every 6 (six) hours as needed for wheezing or shortness of breath. 04/02/17   Harrison Mons, PA  albuterol (PROVENTIL) (2.5 MG/3ML) 0.083% nebulizer solution Take 3 mLs (2.5 mg total) by nebulization every 6 (six) hours as needed for wheezing or shortness of breath. 04/10/17   Jaynee Eagles, PA-C  Boric Acid POWD Place 600 mg vaginally as directed. Place 600 mg capsule intravaginally once a week as needed for irritation: Boric acid suppository 08/02/18   Horald Pollen, MD  diclofenac sodium (VOLTAREN) 1 % GEL Apply up to three times a day as needed to large joint. 07/18/18   Garald Balding, MD  fluticasone-salmeterol (ADVAIR HFA) [phone] MCG/ACT inhaler Inhale 2 puffs into the lungs 2 (two) times daily. 04/02/17   Harrison Mons, PA  gabapentin (NEURONTIN) 300 MG capsule Take 1 capsule (300 mg total) by mouth  3 (three) times daily. 10/21/17   Weber, Damaris Hippo, PA-C  magnesium gluconate (MAGONATE) 500 MG tablet Take 500 mg by mouth at bedtime.    [provider]  Multiple Vitamins-Minerals (MULTIVITAMIN WITH MINERALS) tablet Take 1 tablet by mouth daily.    [provider]  omeprazole (PRILOSEC) 40 MG capsule Take 1 capsule (40 mg total) by mouth daily for 14 days. 02/05/19 02/19/19  Hall-Potvin, [place], PA-C  sucralfate (CARAFATE) 1 g tablet Take 1 tablet (1 g total) by mouth 4 (four) times daily. 02/05/19    Hall-Potvin, [place], PA-C  VOLTAREN 1 % GEL  09/13/15   [provider]  pantoprazole (PROTONIX) 40 MG tablet Take 1 tablet (40 mg total) by mouth daily. 09/16/17 11/03/18  Mancel Bale, PA-C    Family History Family History  Problem Relation Age of Onset  . Heart disease Daughter   . Hypertension Father   . Hypertension Sister   . Hypertension Brother   . Hypertension Maternal Grandfather   . Heart disease Maternal Grandfather   . Hypertension Paternal Grandfather   . Hypertension Brother   . Hypertension Sister   . Thyroid disease Neg Hx     Social History Social History   Tobacco Use  . Smoking status: Never Smoker  . Smokeless tobacco: Never Used  Substance Use Topics  . Alcohol use: No  . Drug use: No     Allergies   Hydromorphone   Review of Systems Review of Systems  Constitutional: Negative for fatigue and fever.  HENT: Negative for ear pain, sinus pain, sore throat and voice change.   Eyes: Negative for pain, redness and visual disturbance.  Respiratory: Negative for cough and shortness of breath.   Cardiovascular: Negative for chest pain and palpitations.  Gastrointestinal: Positive for abdominal pain and nausea. Negative for abdominal distention, blood in stool and vomiting.  Musculoskeletal: Negative for arthralgias and myalgias.  Skin: Negative for rash and wound.  Neurological: Negative for syncope and headaches.     Physical Exam Triage Vital Signs ED Triage Vitals  Enc Vitals Group     BP 02/05/19 0931 (!) 147/106     Pulse Rate 02/05/19 0931 84     Resp 02/05/19 0931 18     Temp 02/05/19 0931 98.3 F (36.8 C)     Temp Source 02/05/19 0931 Oral     SpO2 02/05/19 0931 98 %     Weight --      Height --      Head Circumference --      Peak Flow --      Pain Score 02/05/19 0932 3     Pain Loc --      Pain Edu? --      Excl. in [place]? --    No data found.  Updated Vital Signs BP (!) 147/106 (BP Location: Left Arm)   Pulse 84    Temp 98.3 F (36.8 C) (Oral)   Resp 18   LMP 06/06/1996   SpO2 98%   Visual Acuity Right Eye Distance:   Left Eye Distance:   Bilateral Distance:    Right Eye Near:   Left Eye Near:    Bilateral Near:     Physical Exam Constitutional:      General: She is not in acute distress.    Appearance: She is not toxic-appearing.  HENT:     Head: Normocephalic and atraumatic.     Mouth/Throat:     Mouth: Mucous membranes are  moist.     Pharynx: Oropharynx is clear.  Eyes:     General: No scleral icterus.    Pupils: Pupils are equal, round, and reactive to light.  Cardiovascular:     Rate and Rhythm: Normal rate and regular rhythm.     Heart sounds: No murmur. No gallop.   Pulmonary:     Effort: Pulmonary effort is normal. No respiratory distress.     Breath sounds: No wheezing.  Abdominal:     General: Abdomen is flat. A surgical scar is present. Bowel sounds are normal. There is no distension or abdominal bruit.     Palpations: Abdomen is soft. There is no hepatomegaly, splenomegaly or mass.     Tenderness: There is abdominal tenderness in the epigastric area. There is no right CVA tenderness, left CVA tenderness, guarding or rebound. Negative signs include Murphy's sign and McBurney's sign.  Skin:    General: Skin is warm.     Capillary Refill: Capillary refill takes less than 2 seconds.     Coloration: Skin is not jaundiced or pale.  Neurological:     General: No focal deficit present.     Mental Status: She is alert and oriented to person, place, and time.      UC Treatments / Results  Labs (all labs ordered are listed, but only abnormal results are displayed) Labs Reviewed - No data to display  EKG   Radiology No results found.  Procedures Procedures (including critical care time)  Medications Ordered in UC Medications  alum & mag hydroxide-simeth (MAALOX/MYLANTA) 200-200-20 MG/5ML suspension 30 mL (30 mLs Oral Given 02/05/19 0954)    And  lidocaine  (XYLOCAINE) 2 % viscous mouth solution 15 mL (15 mLs Oral Given 02/05/19 0954)    Initial Impression / Assessment and Plan / UC Course  I have reviewed the triage vital signs and the nursing notes.  Pertinent labs & imaging results that were available during my care of the patient were reviewed by me and considered in my medical decision making (see chart for details).     1.  Gastritis Patient given GI cocktail with viscous lidocaine which she tolerated well and reports mild improvement in nausea/pain.  Reviewed case with Loura Halt, NP, who agreed with assessment/plan: Will treat for gastritis Carafate 4 times daily, PPI x14 days as outlined below.  Low concern for hemorrhagic ulcers given lack of emesis, melena, hematochezia.  Pancreatitis on differential, though lower concern given this was greater than 5 years ago and postprocedural.  Patient does not endorse alcohol or hypertriglyceridemia which this provider verified via chart review.  Patient to follow-up with GI for persistent systems/H. pylori testing.  ER return precautions discussed, patient verbalized understanding and is agreeable to plan. Final Clinical Impressions(s) / UC Diagnoses   Final diagnoses:  Acute gastritis without hemorrhage, unspecified gastritis type     Discharge Instructions     Take Carafate before each meal, bedtime. Important to take your omeprazole every day. If still having symptoms, may warrant further follow-up with GI (number provided below) Go to ER for worsening pain, bloody or black vomit, black or bloody stools, fever.    ED Prescriptions    Medication Sig Dispense Auth. Provider   sucralfate (CARAFATE) 1 g tablet Take 1 tablet (1 g total) by mouth 4 (four) times daily. 56 tablet Hall-Potvin, [place], PA-C   omeprazole (PRILOSEC) 40 MG capsule Take 1 capsule (40 mg total) by mouth daily for 14 days. 14 capsule Hall-Potvin, [place],  PA-C     PDMP not reviewed this encounter.    Hall-Potvin, [place], [place] 02/05/19 1040

## 2019-02-05 NOTE — Discharge Instructions (Addendum)
Take Carafate before each meal, bedtime. Important to take your omeprazole every day. If still having symptoms, may warrant further follow-up with GI (number provided below) Go to ER for worsening pain, bloody or black vomit, black or bloody stools, fever.

## 2019-02-05 NOTE — ED Triage Notes (Signed)
Pt c/o upper center epigastric pain since yesterday and nausea this am. States hx of ulcers, this feels the same.

## 2019-02-06 ENCOUNTER — Telehealth: Payer: Self-pay | Admitting: Emergency Medicine

## 2019-02-06 NOTE — Telephone Encounter (Signed)
Checked in on patient, discussed medications, and encouraged return call with any continuing questions or concerns.    

## 2019-03-15 ENCOUNTER — Ambulatory Visit: Payer: 59

## 2019-04-04 ENCOUNTER — Encounter: Payer: Self-pay | Admitting: Emergency Medicine

## 2019-04-07 ENCOUNTER — Telehealth: Payer: Self-pay | Admitting: Emergency Medicine

## 2019-04-07 NOTE — Telephone Encounter (Signed)
Pt requesting referral for colon cancer screening and handicap sticker Pt had hip surgery 05/2017. Please advise  I made pt an appt for the referral. She did not want it and canceled

## 2019-04-10 ENCOUNTER — Ambulatory Visit: Payer: 59 | Admitting: Emergency Medicine

## 2019-04-12 NOTE — Telephone Encounter (Signed)
Please provide permanent handicap sticker application. Thanks.

## 2019-04-13 ENCOUNTER — Other Ambulatory Visit: Payer: 59

## 2019-04-18 ENCOUNTER — Ambulatory Visit
Admission: EM | Admit: 2019-04-18 | Discharge: 2019-04-18 | Disposition: A | Discharge status: Home / Self Care | Payer: 59 | Attending: Physician Assistant | Admitting: Physician Assistant

## 2019-04-18 ENCOUNTER — Ambulatory Visit (INDEPENDENT_AMBULATORY_CARE_PROVIDER_SITE_OTHER): Payer: 59

## 2019-04-18 ENCOUNTER — Ambulatory Visit: Payer: 59 | Admitting: Emergency Medicine

## 2019-04-18 DIAGNOSIS — M79676 Pain in unspecified toe(s): Secondary | ICD-10-CM

## 2019-04-18 DIAGNOSIS — W2209XA Striking against other stationary object, initial encounter: Secondary | ICD-10-CM | POA: Diagnosis not present

## 2019-04-18 DIAGNOSIS — M79675 Pain in left toe(s): Secondary | ICD-10-CM

## 2019-04-18 DIAGNOSIS — W228XXA Striking against or struck by other objects, initial encounter: Secondary | ICD-10-CM | POA: Diagnosis not present

## 2019-04-18 DIAGNOSIS — S92425A Nondisplaced fracture of distal phalanx of left great toe, initial encounter for closed fracture: Secondary | ICD-10-CM

## 2019-04-18 NOTE — ED Provider Notes (Signed)
[name]    CSN: [id number] Arrival date & time: 04/18/19  1402      History   Chief Complaint Chief Complaint  Patient presents with  . Toe Pain    HPI Kendra Gonzalez Kawasaki is a 48 y.o. female.   48 year old female comes in for left 3rd toe pain after injury shortly prior to arrival. She hit the corner of a toy chest with the toe. Still able to weight bear, but with pain. Denies numbness/tingling. Has mild swelling to the toe. Has not tried anything for the symptoms.      Past Medical History:  Diagnosis Date  . Allergy   . Arthritis   . Asthma   . Endometriosis   . GERD (gastroesophageal reflux disease)   . History of tetanus, diphtheria, and acellular pertussis booster vaccination (Tdap)    07/30/2010  . Hyperlipidemia   . Kidney stones   . Pancreatitis     Patient Active Problem List   Diagnosis Date Noted  . Palpitations 12/27/2018  . Presence of left artificial hip joint 07/09/2017  . Hyperthyroidism 11/03/2016  . Multinodular goiter 04/17/2015  . History of stomach ulcers 03/22/2015  . Chronic pain of left heel 04/23/2014  . Chronic pain syndrome 03/19/2014  . Asthma 01/04/2013  . BMI 29.0-29.9,adult 10/12/2011  . 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 Hyperlipidemia 02/16/2007  . ALLERGIC RHINITIS 02/16/2007  . GERD 02/16/2007    Past Surgical History:  Procedure Laterality Date  . ABDOMINAL HYSTERECTOMY    . arthroscopic knee surgery     . carpel tunnel    . CHOLECYSTECTOMY    . ERCP  01/08/2012   Procedure: ENDOSCOPIC RETROGRADE CHOLANGIOPANCREATOGRAPHY (ERCP);  Surgeon: Beryle Beams, MD;  Location: [facility] ENDOSCOPY;  Service: Endoscopy;  Laterality: N/A;  . HIP SURGERY    . oophrectomy  2011   secondary to cysts  . TUBAL LIGATION      OB History   No obstetric history on file.      Home Medications    Prior to Admission medications   Medication Sig Start Date End Date Taking? Authorizing Provider  acetaminophen (TYLENOL)  500 MG tablet Take 500 mg by mouth every 6 (six) hours as needed.    [provider]  albuterol (PROVENTIL HFA;VENTOLIN HFA) 108 (90 Base) MCG/ACT inhaler Inhale 2 puffs into the lungs every 6 (six) hours as needed for wheezing or shortness of breath. 04/02/17   Harrison Mons, PA  albuterol (PROVENTIL) (2.5 MG/3ML) 0.083% nebulizer solution Take 3 mLs (2.5 mg total) by nebulization every 6 (six) hours as needed for wheezing or shortness of breath. 04/10/17   Jaynee Eagles, PA-C  Boric Acid POWD Place 600 mg vaginally as directed. Place 600 mg capsule intravaginally once a week as needed for irritation: Boric acid suppository 08/02/18   Horald Pollen, MD  diclofenac sodium (VOLTAREN) 1 % GEL Apply up to three times a day as needed to large joint. 07/18/18   Garald Balding, MD  fluticasone-salmeterol (ADVAIR HFA) [phone] MCG/ACT inhaler Inhale 2 puffs into the lungs 2 (two) times daily. 04/02/17   Harrison Mons, PA  gabapentin (NEURONTIN) 300 MG capsule Take 1 capsule (300 mg total) by mouth 3 (three) times daily. 10/21/17   Weber, Damaris Hippo, PA-C  magnesium gluconate (MAGONATE) 500 MG tablet Take 500 mg by mouth at bedtime.    [provider]  Multiple Vitamins-Minerals (MULTIVITAMIN WITH MINERALS) tablet Take 1 tablet by mouth daily.  [provider]  omeprazole (PRILOSEC) 40 MG capsule Take 1 capsule (40 mg total) by mouth daily for 14 days. 02/05/19 02/19/19  Hall-Potvin, [place], PA-C  sucralfate (CARAFATE) 1 g tablet Take 1 tablet (1 g total) by mouth 4 (four) times daily. 02/05/19   Hall-Potvin, [place], PA-C  VOLTAREN 1 % GEL  09/13/15   [provider]  pantoprazole (PROTONIX) 40 MG tablet Take 1 tablet (40 mg total) by mouth daily. 09/16/17 11/03/18  Mancel Bale, PA-C    Family History Family History  Problem Relation Age of Onset  . Heart disease Daughter   . Hypertension Father   . Hypertension Sister   . Hypertension Brother   .  Hypertension Maternal Grandfather   . Heart disease Maternal Grandfather   . Hypertension Paternal Grandfather   . Hypertension Brother   . Hypertension Sister   . Thyroid disease Neg Hx     Social History Social History   Tobacco Use  . Smoking status: Never Smoker  . Smokeless tobacco: Never Used  Substance Use Topics  . Alcohol use: No  . Drug use: No     Allergies   Hydromorphone   Review of Systems Review of Systems  Reason unable to perform ROS: See HPI as above.     Physical Exam Triage Vital Signs ED Triage Vitals [04/18/19 1416]  Enc Vitals Group     BP (!) 148/89     Pulse Rate 85     Resp 18     Temp 98.3 F (36.8 C)     Temp Source Oral     SpO2 98 %     Weight      Height      Head Circumference      Peak Flow      Pain Score 6     Pain Loc      Pain Edu?      Excl. in [place]?    No data found.  Updated Vital Signs BP (!) 148/89 (BP Location: Left Arm)   Pulse 85   Temp 98.3 F (36.8 C) (Oral)   Resp 18   LMP 06/06/1996   SpO2 98%   Visual Acuity Right Eye Distance:   Left Eye Distance:   Bilateral Distance:    Right Eye Near:   Left Eye Near:    Bilateral Near:     Physical Exam Constitutional:      General: She is not in acute distress.    Appearance: She is well-developed. She is not diaphoretic.  HENT:     Head: Normocephalic and atraumatic.  Eyes:     Conjunctiva/sclera: Conjunctivae normal.     Pupils: Pupils are equal, round, and reactive to light.  Pulmonary:     Effort: Pulmonary effort is normal. No respiratory distress.  Musculoskeletal:     Comments: Mild swelling of the left third toe without contusion, obvious deformity. Tenderness to palpation of left third toe without tenderness to MTP. Full ROM of toe, though with pain. Sensation intact. Pedal pulse 2+, cap refill <2s  Skin:    General: Skin is warm and dry.  Neurological:     Mental Status: She is alert and oriented to person, place, and time.       UC Treatments / Results  Labs (all labs ordered are listed, but only abnormal results are displayed) Labs Reviewed - No data to display  EKG   Radiology DG Foot Complete Left  Result Date: 04/18/2019 CLINICAL  DATA:  Injury after kicking box EXAM: LEFT FOOT - COMPLETE 3+ VIEW COMPARISON:  None. FINDINGS: Frontal, oblique, and lateral views obtained. There is an incomplete fracture along the distal aspect of the first proximal phalanx with alignment essentially anatomic. No other fracture evident. No dislocation. There is mild hallux valgus deformity at the first MTP joint with bunion formation medial to the first MTP joint. Other joint spaces appear unremarkable. No erosive change. There are inferior and posterior calcaneal spurs. IMPRESSION: Apparent incomplete fracture in the distal first proximal phalanx which extends to the first IP joint. Alignment is essentially anatomic. No other evident fracture. No dislocation. There is mild hallux valgus deformity at the first MTP joint with bunion formation medially at the first MTP joint. There are calcaneal spurs. These results will be called to the ordering clinician or representative by the Radiologist Assistant, and communication documented in the PACS or zVision Dashboard. Electronically Signed   By: Lowella Grip III M.D.   On: 04/18/2019 14:46    Procedures Procedures (including critical care time)  Medications Ordered in UC Medications - No data to display  Initial Impression / Assessment and Plan / UC Course  I have reviewed the triage vital signs and the nursing notes.  Pertinent labs & imaging results that were available during my care of the patient were reviewed by me and considered in my medical decision making (see chart for details).    Discussed xray results with patient. Deformity on xray seen to the left great toe, however, no injury to the area. Reexamined great toe without pain, with good ROM. Will have patient  monitor great toe at this time. No obvious fractures to the left 3rd toe. Ice  Compress, elevation, post op boot during activity. NSAIDs as directed. Return precautions given.  Final Clinical Impressions(s) / UC Diagnoses   Final diagnoses:  Pain of toe of left foot   ED Prescriptions    None     PDMP not reviewed this encounter.   Ok Edwards, PA-C 04/18/19 1504

## 2019-04-18 NOTE — ED Triage Notes (Signed)
Pt c/o lt 3rd toe pain, hit it on the corner of a toy chest 1hr ago

## 2019-04-19 ENCOUNTER — Encounter: Payer: Self-pay | Admitting: Emergency Medicine

## 2019-05-09 ENCOUNTER — Encounter: Payer: Self-pay | Admitting: Emergency Medicine

## 2019-05-23 ENCOUNTER — Ambulatory Visit: Payer: 59 | Admitting: Emergency Medicine

## 2019-05-23 ENCOUNTER — Other Ambulatory Visit: Payer: Self-pay

## 2019-05-23 ENCOUNTER — Encounter: Payer: Self-pay | Admitting: Emergency Medicine

## 2019-05-23 VITALS — BP 138/85 | HR 73 | Temp 97.8°F | Wt 199.8 lb

## 2019-05-23 DIAGNOSIS — G43919 Migraine, unspecified, intractable, without status migrainosus: Secondary | ICD-10-CM

## 2019-05-23 MED ORDER — SUMATRIPTAN SUCCINATE 50 MG PO TABS: ORAL_TABLET | ORAL | 0 refills | Status: DC

## 2019-05-23 NOTE — Progress Notes (Signed)
Kendra Gonzalez 49 y.o.   Chief Complaint  Patient presents with  . Headache    headache been going on off/on for a month and get them once or twice a week    HISTORY OF PRESENT ILLNESS: This is a 49 y.o. female complaining of diffuse headache on and off for 1 month.  Has a history of migraines.  Describes headache as throbbing from back to front with nausea and photosensitivity.  Tylenol not helping.  No fever or flulike symptoms.  No vomiting.  No visual abnormalities.  Denies head injury.  No other significant symptoms.  HPI   Prior to Admission medications   Medication Sig Start Date End Date Taking? Authorizing Provider  acetaminophen (TYLENOL) 500 MG tablet Take 500 mg by mouth every 6 (six) hours as needed.   Yes [provider]  albuterol (PROVENTIL HFA;VENTOLIN HFA) 108 (90 Base) MCG/ACT inhaler Inhale 2 puffs into the lungs every 6 (six) hours as needed for wheezing or shortness of breath. 04/02/17  Yes Jeffery, Chelle, PA  albuterol (PROVENTIL) (2.5 MG/3ML) 0.083% nebulizer solution Take 3 mLs (2.5 mg total) by nebulization every 6 (six) hours as needed for wheezing or shortness of breath. 04/10/17  Yes Jaynee Eagles, PA-C  Boric Acid POWD Place 600 mg vaginally as directed. Place 600 mg capsule intravaginally once a week as needed for irritation: Boric acid suppository 08/02/18  Yes Arcenio Mullaly, Ines Bloomer, MD  diclofenac sodium (VOLTAREN) 1 % GEL Apply up to three times a day as needed to large joint. 07/18/18  Yes Garald Balding, MD  fluticasone-salmeterol (ADVAIR HFA) [phone] MCG/ACT inhaler Inhale 2 puffs into the lungs 2 (two) times daily. 04/02/17  Yes Jeffery, Chelle, PA  gabapentin (NEURONTIN) 300 MG capsule Take 1 capsule (300 mg total) by mouth 3 (three) times daily. 10/21/17  Yes Weber, Sarah L, PA-C  magnesium gluconate (MAGONATE) 500 MG tablet Take 500 mg by mouth at bedtime.   Yes [provider]  Multiple Vitamins-Minerals (MULTIVITAMIN WITH MINERALS)  tablet Take 1 tablet by mouth daily.   Yes [provider]  sucralfate (CARAFATE) 1 g tablet Take 1 tablet (1 g total) by mouth 4 (four) times daily. 02/05/19  Yes Hall-Potvin, [place], PA-C  VOLTAREN 1 % GEL  09/13/15  Yes [provider]  pantoprazole (PROTONIX) 40 MG tablet Take 1 tablet (40 mg total) by mouth daily. 09/16/17 11/03/18  Gale Journey, Damaris Hippo, PA-C    Allergies  Allergen Reactions  . Hydromorphone Itching    Patient Active Problem List   Diagnosis Date Noted  . Presence of left artificial hip joint 07/09/2017  . Hyperthyroidism 11/03/2016  . Multinodular goiter 04/17/2015  . History of stomach ulcers 03/22/2015  . Chronic pain of left heel 04/23/2014  . Chronic pain syndrome 03/19/2014  . Asthma 01/04/2013  . BMI 29.0-29.9,adult 10/12/2011  . 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 Hyperlipidemia 02/16/2007  . GERD 02/16/2007    Past Medical History:  Diagnosis Date  . Allergy   . Arthritis   . Asthma   . Endometriosis   . GERD (gastroesophageal reflux disease)   . History of tetanus, diphtheria, and acellular pertussis booster vaccination (Tdap)    07/30/2010  . Hyperlipidemia   . Kidney stones   . Pancreatitis     Past Surgical History:  Procedure Laterality Date  . ABDOMINAL HYSTERECTOMY    . arthroscopic knee surgery     . carpel tunnel    . CHOLECYSTECTOMY    .  ERCP  01/08/2012   Procedure: ENDOSCOPIC RETROGRADE CHOLANGIOPANCREATOGRAPHY (ERCP);  Surgeon: Beryle Beams, MD;  Location: [facility] ENDOSCOPY;  Service: Endoscopy;  Laterality: N/A;  . HIP SURGERY    . oophrectomy  2011   secondary to cysts  . TUBAL LIGATION      Social History   Socioeconomic History  . Marital status: Married    Spouse name: Fritz Pickerel  . Number of children: 2  . Years of education: Not on file  . Highest education level: Not on file  Occupational History  . Occupation: Nursing Aid    Employer: [employer]  Tobacco Use  . Smoking status: Never Smoker  .  Smokeless tobacco: Never Used  Substance and Sexual Activity  . Alcohol use: No  . Drug use: No  . Sexual activity: Yes    Birth control/protection: None  Other Topics Concern  . Not on file  Social History Narrative   Works as a Education officer, environmental.     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   Adult children live in [place], [place] and [place].   Social Determinants of Health   Financial Resource Strain:   . Difficulty of Paying Living Expenses: Not on file  Food Insecurity:   . Worried About Charity fundraiser in the Last Year: Not on file  . Ran Out of Food in the Last Year: Not on file  Transportation Needs:   . Lack of Transportation (Medical): Not on file  . Lack of Transportation (Non-Medical): Not on file  Physical Activity:   . Days of Exercise per Week: Not on file  . Minutes of Exercise per Session: Not on file  Stress:   . Feeling of Stress : Not on file  Social Connections:   . Frequency of Communication with Friends and Family: Not on file  . Frequency of Social Gatherings with Friends and Family: Not on file  . Attends Religious Services: Not on file  . Active Member of Clubs or Organizations: Not on file  . Attends Archivist Meetings: Not on file  . Marital Status: Not on file  Intimate Partner Violence:   . Fear of Current or Ex-Partner: Not on file  . Emotionally Abused: Not on file  . Physically Abused: Not on file  . Sexually Abused: Not on file    Family History  Problem Relation Age of Onset  . Heart disease Daughter   . Hypertension Father   . Hypertension Sister   . Hypertension Brother   . Hypertension Maternal Grandfather   . Heart disease Maternal Grandfather   . Hypertension Paternal Grandfather   . Hypertension Brother   . Hypertension Sister   . Thyroid disease Neg Hx      Review of Systems  Constitutional: Negative.  Negative for chills and fever.  HENT: Negative.  Negative for congestion, ear pain, nosebleeds and sore throat.     Eyes: Negative.  Negative for blurred vision and double vision.  Respiratory: Negative for cough, hemoptysis and shortness of breath.   Cardiovascular: Negative.  Negative for chest pain and palpitations.  Gastrointestinal: Negative for abdominal pain, blood in stool, diarrhea, melena, nausea and vomiting.  Genitourinary: Negative.  Negative for hematuria.  Skin: Negative.  Negative for rash.  Neurological: Positive for headaches. Negative for dizziness.  All other systems reviewed and are negative.   Today's Vitals   05/23/19 1504  BP: 138/85  Pulse: 73  Temp: 97.8 F (36.6 C)  TempSrc: Temporal  SpO2:  99%  Weight: 199 lb 12.8 oz (90.6 kg)   Body mass index is 34.3 kg/m.  Physical Exam Vitals reviewed.  Constitutional:      Appearance: She is well-developed.  HENT:     Head: Normocephalic.  Eyes:     Extraocular Movements: Extraocular movements intact.     Conjunctiva/sclera: Conjunctivae normal.     Pupils: Pupils are equal, round, and reactive to light.  Cardiovascular:     Rate and Rhythm: Normal rate and regular rhythm.     Pulses: Normal pulses.     Heart sounds: Normal heart sounds.  Pulmonary:     Effort: Pulmonary effort is normal.     Breath sounds: Normal breath sounds.  Musculoskeletal:        General: Normal range of motion.     Cervical back: Normal range of motion and neck supple.  Lymphadenopathy:     Cervical: No cervical adenopathy.  Skin:    General: Skin is warm and dry.     Capillary Refill: Capillary refill takes less than 2 seconds.  Neurological:     General: No focal deficit present.     Mental Status: She is alert and oriented to person, place, and time.  Psychiatric:        Mood and Affect: Mood normal.        Behavior: Behavior normal.      ASSESSMENT & PLAN: Moselle was seen today for headache.  Diagnoses and all orders for this visit:  Intractable migraine without status migrainosus, unspecified migraine type -      SUMAtriptan (IMITREX) 50 MG tablet; Take 50 mg at the onset of headache; may repeat every 2 hours but no more than 200 mg/24 hours.    Patient Instructions       If you have lab work done today you will be contacted with your lab results within the next 2 weeks.  If you have not heard from [place] then please contact us. The fastest way to get your results is to register for My Chart.   IF you received an x-ray today, you will receive an invoice from [facility]. Please contact [facility] at [phone] with questions or concerns regarding your invoice.   IF you received labwork today, you will receive an invoice from [facility]. Please contact [facility] at [phone] with questions or concerns regarding your invoice.   Our billing staff will not be able to assist you with questions regarding bills from these companies.  You will be contacted with the lab results as soon as they are available. The fastest way to get your results is to activate your My Chart account. Instructions are located on the last page of this paperwork. If you have not heard from [place] regarding the results in 2 weeks, please contact this office.      Migraine Headache A migraine headache is a very strong throbbing pain on one side or both sides of your head. This type of headache can also cause other symptoms. It can last from 4 hours to 3 days. Talk with your doctor about what things may bring on (trigger) this condition. What are the causes? The exact cause of this condition is not known. This condition may be triggered or caused by:  Drinking alcohol.  Smoking.  Taking medicines, such as: ? Medicine used to treat chest pain (nitroglycerin). ? Birth control pills. ? Estrogen. ? Some blood pressure medicines.  Eating or drinking certain products.  Doing physical activity. Other things  that may trigger a migraine headache include:  Having a menstrual  period.  Pregnancy.  Hunger.  Stress.  Not getting enough sleep or getting too much sleep.  Weather changes.  Tiredness (fatigue). What increases the risk?  Being 24-56 years old.  Being female.  Having a family history of migraine headaches.  Being Caucasian.  Having depression or anxiety.  Being very overweight. What are the signs or symptoms?  A throbbing pain. This pain may: ? Happen in any area of the head, such as on one side or both sides. ? Make it hard to do daily activities. ? Get worse with physical activity. ? Get worse around bright lights or loud noises.  Other symptoms may include: ? Feeling sick to your stomach (nauseous). ? Vomiting. ? Dizziness. ? Being sensitive to bright lights, loud noises, or smells.  Before you get a migraine headache, you may get warning signs (an aura). An aura may include: ? Seeing flashing lights or having blind spots. ? Seeing bright spots, halos, or zigzag lines. ? Having tunnel vision or blurred vision. ? Having numbness or a tingling feeling. ? Having trouble talking. ? Having weak muscles.  Some people have symptoms after a migraine headache (postdromal phase), such as: ? Tiredness. ? Trouble thinking (concentrating). How is this treated?  Taking medicines that: ? Relieve pain. ? Relieve the feeling of being sick to your stomach. ? Prevent migraine headaches.  Treatment may also include: ? Having acupuncture. ? Avoiding foods that bring on migraine headaches. ? Learning ways to control your body functions (biofeedback). ? Therapy to help you know and deal with negative thoughts (cognitive behavioral therapy). Follow these instructions at home: Medicines  Take over-the-counter and prescription medicines only as told by your doctor.  Ask your doctor if the medicine prescribed to you: ? Requires you to avoid driving or using heavy machinery. ? Can cause trouble pooping (constipation). You may need to  take these steps to prevent or treat trouble pooping:  Drink enough fluid to keep your pee (urine) pale yellow.  Take over-the-counter or prescription medicines.  Eat foods that are high in fiber. These include beans, whole grains, and fresh fruits and vegetables.  Limit foods that are high in fat and sugar. These include fried or sweet foods. Lifestyle  Do not drink alcohol.  Do not use any products that contain nicotine or tobacco, such as cigarettes, e-cigarettes, and chewing tobacco. If you need help quitting, ask your doctor.  Get at least 8 hours of sleep every night.  Limit and deal with stress. General instructions      Keep a journal to find out what may bring on your migraine headaches. For example, write down: ? What you eat and drink. ? How much sleep you get. ? Any change in what you eat or drink. ? Any change in your medicines.  If you have a migraine headache: ? Avoid things that make your symptoms worse, such as bright lights. ? It may help to lie down in a dark, quiet room. ? Do not drive or use heavy machinery. ? Ask your doctor what activities are safe for you.  Keep all follow-up visits as told by your doctor. This is important. Contact a doctor if:  You get a migraine headache that is different or worse than others you have had.  You have more than 15 headache days in one month. Get help right away if:  Your migraine headache gets very bad.  Your migraine  headache lasts longer than 72 hours.  You have a fever.  You have a stiff neck.  You have trouble seeing.  Your muscles feel weak or like you cannot control them.  You start to lose your balance a lot.  You start to have trouble walking.  You pass out (faint).  You have a seizure. Summary  A migraine headache is a very strong throbbing pain on one side or both sides of your head. These headaches can also cause other symptoms.  This condition may be treated with medicines and  changes to your lifestyle.  Keep a journal to find out what may bring on your migraine headaches.  Contact a doctor if you get a migraine headache that is different or worse than others you have had.  Contact your doctor if you have more than 15 headache days in a month. This information is not intended to replace advice given to you by your health care provider. Make sure you discuss any questions you have with your health care provider. Document Revised: 08/05/2018 Document Reviewed: 05/26/2018 Elsevier Patient Education  2020 Elsevier Inc.      Agustina Caroli, MD [facility]

## 2019-05-23 NOTE — Patient Instructions (Addendum)
If you have lab work done today you will be contacted with your lab results within the next 2 weeks.  If you have not heard from [place] then please contact us. The fastest way to get your results is to register for My Chart.   IF you received an x-ray today, you will receive an invoice from [facility]. Please contact [facility] at [phone] with questions or concerns regarding your invoice.   IF you received labwork today, you will receive an invoice from [facility]. Please contact [facility] at [phone] with questions or concerns regarding your invoice.   Our billing staff will not be able to assist you with questions regarding bills from these companies.  You will be contacted with the lab results as soon as they are available. The fastest way to get your results is to activate your My Chart account. Instructions are located on the last page of this paperwork. If you have not heard from [place] regarding the results in 2 weeks, please contact this office.      Migraine Headache A migraine headache is a very strong throbbing pain on one side or both sides of your head. This type of headache can also cause other symptoms. It can last from 4 hours to 3 days. Talk with your doctor about what things may bring on (trigger) this condition. What are the causes? The exact cause of this condition is not known. This condition may be triggered or caused by:  Drinking alcohol.  Smoking.  Taking medicines, such as: ? Medicine used to treat chest pain (nitroglycerin). ? Birth control pills. ? Estrogen. ? Some blood pressure medicines.  Eating or drinking certain products.  Doing physical activity. Other things that may trigger a migraine headache include:  Having a menstrual period.  Pregnancy.  Hunger.  Stress.  Not getting enough sleep or getting too much sleep.  Weather changes.  Tiredness (fatigue). What increases the risk?  Being 48-55 years  old.  Being female.  Having a family history of migraine headaches.  Being Caucasian.  Having depression or anxiety.  Being very overweight. What are the signs or symptoms?  A throbbing pain. This pain may: ? Happen in any area of the head, such as on one side or both sides. ? Make it hard to do daily activities. ? Get worse with physical activity. ? Get worse around bright lights or loud noises.  Other symptoms may include: ? Feeling sick to your stomach (nauseous). ? Vomiting. ? Dizziness. ? Being sensitive to bright lights, loud noises, or smells.  Before you get a migraine headache, you may get warning signs (an aura). An aura may include: ? Seeing flashing lights or having blind spots. ? Seeing bright spots, halos, or zigzag lines. ? Having tunnel vision or blurred vision. ? Having numbness or a tingling feeling. ? Having trouble talking. ? Having weak muscles.  Some people have symptoms after a migraine headache (postdromal phase), such as: ? Tiredness. ? Trouble thinking (concentrating). How is this treated?  Taking medicines that: ? Relieve pain. ? Relieve the feeling of being sick to your stomach. ? Prevent migraine headaches.  Treatment may also include: ? Having acupuncture. ? Avoiding foods that bring on migraine headaches. ? Learning ways to control your body functions (biofeedback). ? Therapy to help you know and deal with negative thoughts (cognitive behavioral therapy). Follow these instructions at home: Medicines  Take over-the-counter and prescription medicines only as told by your doctor.  Ask  your doctor if the medicine prescribed to you: ? Requires you to avoid driving or using heavy machinery. ? Can cause trouble pooping (constipation). You may need to take these steps to prevent or treat trouble pooping:  Drink enough fluid to keep your pee (urine) pale yellow.  Take over-the-counter or prescription medicines.  Eat foods that are  high in fiber. These include beans, whole grains, and fresh fruits and vegetables.  Limit foods that are high in fat and sugar. These include fried or sweet foods. Lifestyle  Do not drink alcohol.  Do not use any products that contain nicotine or tobacco, such as cigarettes, e-cigarettes, and chewing tobacco. If you need help quitting, ask your doctor.  Get at least 8 hours of sleep every night.  Limit and deal with stress. General instructions      Keep a journal to find out what may bring on your migraine headaches. For example, write down: ? What you eat and drink. ? How much sleep you get. ? Any change in what you eat or drink. ? Any change in your medicines.  If you have a migraine headache: ? Avoid things that make your symptoms worse, such as bright lights. ? It may help to lie down in a dark, quiet room. ? Do not drive or use heavy machinery. ? Ask your doctor what activities are safe for you.  Keep all follow-up visits as told by your doctor. This is important. Contact a doctor if:  You get a migraine headache that is different or worse than others you have had.  You have more than 15 headache days in one month. Get help right away if:  Your migraine headache gets very bad.  Your migraine headache lasts longer than 72 hours.  You have a fever.  You have a stiff neck.  You have trouble seeing.  Your muscles feel weak or like you cannot control them.  You start to lose your balance a lot.  You start to have trouble walking.  You pass out (faint).  You have a seizure. Summary  A migraine headache is a very strong throbbing pain on one side or both sides of your head. These headaches can also cause other symptoms.  This condition may be treated with medicines and changes to your lifestyle.  Keep a journal to find out what may bring on your migraine headaches.  Contact a doctor if you get a migraine headache that is different or worse than others  you have had.  Contact your doctor if you have more than 15 headache days in a month. This information is not intended to replace advice given to you by your health care provider. Make sure you discuss any questions you have with your health care provider. Document Revised: 08/05/2018 Document Reviewed: 05/26/2018 Elsevier Patient Education  [address].

## 2019-05-27 ENCOUNTER — Telehealth: Payer: Self-pay | Admitting: Emergency Medicine

## 2019-05-27 MED ORDER — SUMATRIPTAN SUCCINATE 50 MG PO TABS: 50.0000 mg | ORAL_TABLET | ORAL | 0 refills | Status: DC | PRN

## 2019-05-27 NOTE — Telephone Encounter (Signed)
Unable to complete and close chart.

## 2019-05-27 NOTE — ED Provider Notes (Addendum)
Virtual Visit via Video Note:  [name]  initiated request for Telemedicine visit with [facility] team. I connected with [name]  on 05/27/2019 at 955 AM  for a synchronized telemedicine visit using a video enabled HIPPA compliant telemedicine application. I verified that I am speaking with [name]  using two identifiers. Emerson Monte, FNP  was physically located in a [facility] Urgent care site and Delaila Giarraputo Steffler was located at a different location.   The limitations of evaluation and management by telemedicine as well as the availability of in-person appointments were discussed. Patient was informed that she  may incur a bill ( including co-pay) for this virtual visit encounter. Roniesha M Wuellner  expressed understanding and gave verbal consent to proceed with virtual visit.   History of Present Illness:Kendra Gonzalez  is a 49 y.o. female presents with a complaint of  migraine headache for for the past few days.  Pain is localized to her head.  She describes the pain as constant and throbbing in character.  She has tried Imitrex 50 mg without relief.  Her symptoms are made worse with light and movement.  She reports similar symptoms in the past that improved with Imitrex.  She does not described this as the worst headache of her life.  She denies fever, chills, nausea, vomiting, aura, rhinorrhea, watery eyes, chest pain, SOB, abdominal pain, weakness, numbness or tingling.  Past Medical History:  Diagnosis Date  . Allergy   . Arthritis   . Asthma   . Endometriosis   . GERD (gastroesophageal reflux disease)   . History of tetanus, diphtheria, and acellular pertussis booster vaccination (Tdap)    07/30/2010  . Hyperlipidemia   . Kidney stones   . Pancreatitis     Allergies  Allergen Reactions  . Hydromorphone Itching     Observations/Objective: VITALS: Per patient if applicable, see vitals. GENERAL: Alert, appears well and in no acute  distress. HEENT: Atraumatic, conjunctiva clear, no obvious abnormalities on inspection of external nose and ears. NECK: Normal movements of the head and neck. CARDIOPULMONARY: No increased WOB. Speaking in clear sentences. I:E ratio WNL.  MS: Moves all visible extremities without noticeable abnormality. PSYCH: Pleasant and cooperative, well-groomed. Speech normal rate and rhythm. Affect is appropriate. Insight and judgement are appropriate. Attention is focused, linear, and appropriate.  NEURO: CN grossly intact. Oriented as arrived to appointment on time with no prompting. Moves both UE equally.  SKIN: No obvious lesions, wounds, erythema, or cyanosis noted on face or hands.    Assessment and Plan: Imitrex 50 mg will be  prescribed Patient was advised not to exceed 200 mg in 24 hours  Follow Up Instructions: Follow-up with primary care ER follow-up instruction was given   I discussed the assessment and treatment plan with the patient. The patient was provided an opportunity to ask questions and all were answered. The patient agreed with the plan and demonstrated an understanding of the instructions.   The patient was advised to call back or seek an in-person evaluation if the symptoms worsen or if the condition fails to improve as anticipated.  I provided 15 minutes of non-face-to-face time during this encounter.    Emerson Monte, FNP  05/31/2019 8:24 AM     PS: This telehealth visit occurred on 05/27/2019.  Provider was unable to close the visit on 05/27/2019 due to technical issues.  IT resolve the issue on 05/30/2019.   Pilar Grammes  S, FNP 05/31/19 [phone]

## 2019-05-29 ENCOUNTER — Other Ambulatory Visit: Payer: Self-pay | Admitting: Emergency Medicine

## 2019-05-29 ENCOUNTER — Telehealth: Payer: Self-pay | Admitting: Emergency Medicine

## 2019-05-29 DIAGNOSIS — G43919 Migraine, unspecified, intractable, without status migrainosus: Secondary | ICD-10-CM

## 2019-05-29 MED ORDER — BUTALBITAL-APAP-CAFFEINE 50-325-40 MG PO TABS: 1.0000 | ORAL_TABLET | Freq: Four times a day (QID) | ORAL | 0 refills | Status: DC | PRN

## 2019-05-29 NOTE — Telephone Encounter (Signed)
Please advise 

## 2019-05-29 NOTE — Telephone Encounter (Signed)
What symptoms do you have? Headache everyday and has been prescribed med that are not working   How long have you been sick? Since last week  Have you been seen for this problem? Yes, med not working   If your provider decides to give you a prescription, which pharmacy would you like for it to be sent to?   [place] ([facility] [place] - [address]   Patient informed that this information will be sent to the clinical staff for review and that they should receive a follow up call.

## 2019-05-29 NOTE — Telephone Encounter (Signed)
New prescription sent to her pharmacy of record.  Thanks.

## 2019-05-30 ENCOUNTER — Ambulatory Visit (INDEPENDENT_AMBULATORY_CARE_PROVIDER_SITE_OTHER)
Admission: RE | Admit: 2019-05-30 | Discharge: 2019-05-30 | Disposition: A | Discharge status: Home / Self Care | Payer: 59 | Source: Ambulatory Visit | Attending: Emergency Medicine | Admitting: Emergency Medicine

## 2019-05-30 DIAGNOSIS — G43011 Migraine without aura, intractable, with status migrainosus: Secondary | ICD-10-CM

## 2019-05-31 NOTE — Discharge Instructions (Signed)
Advised patient to take medication as prescribed Follow-up with primary care if symptom does not resolve Go to ED for worsening of symptoms

## 2019-06-14 ENCOUNTER — Telehealth: Payer: Self-pay | Admitting: Emergency Medicine

## 2019-06-14 ENCOUNTER — Encounter: Payer: Self-pay | Admitting: Emergency Medicine

## 2019-06-14 ENCOUNTER — Ambulatory Visit: Payer: 59 | Admitting: Emergency Medicine

## 2019-06-14 ENCOUNTER — Other Ambulatory Visit: Payer: Self-pay

## 2019-06-14 VITALS — BP 141/91 | HR 89 | Temp 97.7°F | Ht 64.0 in | Wt 197.0 lb

## 2019-06-14 DIAGNOSIS — M7918 Myalgia, other site: Secondary | ICD-10-CM

## 2019-06-14 DIAGNOSIS — M25552 Pain in left hip: Secondary | ICD-10-CM

## 2019-06-14 DIAGNOSIS — G8929 Other chronic pain: Secondary | ICD-10-CM

## 2019-06-14 DIAGNOSIS — M79605 Pain in left leg: Secondary | ICD-10-CM | POA: Diagnosis not present

## 2019-06-14 MED ORDER — KETOROLAC TROMETHAMINE 60 MG/2ML IM SOLN
60.0000 mg | Freq: Once | INTRAMUSCULAR | Status: AC
  Administered 2019-06-14: 60 mg via INTRAMUSCULAR

## 2019-06-14 MED ORDER — TRAMADOL HCL 50 MG PO TABS: 50.0000 mg | ORAL_TABLET | Freq: Three times a day (TID) | ORAL | 0 refills | Status: AC | PRN

## 2019-06-14 NOTE — Patient Instructions (Addendum)
If you have lab work done today you will be contacted with your lab results within the next 2 weeks.  If you have not heard from [place] then please contact us. The fastest way to get your results is to register for My Chart.   IF you received an x-ray today, you will receive an invoice from [facility]. Please contact [facility] at [phone] with questions or concerns regarding your invoice.   IF you received labwork today, you will receive an invoice from [place]. Please contact [facility] at [phone] with questions or concerns regarding your invoice.   Our billing staff will not be able to assist you with questions regarding bills from these companies.  You will be contacted with the lab results as soon as they are available. The fastest way to get your results is to activate your My Chart account. Instructions are located on the last page of this paperwork. If you have not heard from [place] regarding the results in 2 weeks, please contact this office.      Radicular Pain Radicular pain is a type of pain that spreads from your back or neck along a spinal nerve. Spinal nerves are nerves that leave the spinal cord and go to the muscles. Radicular pain is sometimes called radiculopathy, radiculitis, or a pinched nerve. When you have this type of pain, you may also have weakness, numbness, or tingling in the area of your body that is supplied by the nerve. The pain may feel sharp and burning. Depending on which spinal nerve is affected, the pain may occur in the:  Neck area (cervical radicular pain). You may also feel pain, numbness, weakness, or tingling in the arms.  Mid-spine area (thoracic radicular pain). You would feel this pain in the back and chest. This type is rare.  Lower back area (lumbar radicular pain). You would feel this pain as low back pain. You may feel pain, numbness, weakness, or tingling in the buttocks or legs. Sciatica is a type of lumbar radicular  pain that shoots down the back of the leg. Radicular pain occurs when one of the spinal nerves becomes irritated or squeezed (compressed). It is often caused by something pushing on a spinal nerve, such as one of the bones of the spine (vertebrae) or one of the round cushions between vertebrae (intervertebral disks). This can result from:  An injury.  Wear and tear or aging of a disk.  The growth of a bone spur that pushes on the nerve. Radicular pain often goes away when you follow instructions from your health care provider for relieving pain at home. Follow these instructions at home: Managing pain      If directed, put ice on the affected area: ? Put ice in a plastic bag. ? Place a towel between your skin and the bag. ? Leave the ice on for 20 minutes, 2-3 times a day.  If directed, apply heat to the affected area as often as told by your health care provider. Use the heat source that your health care provider recommends, such as a moist heat pack or a heating pad. ? Place a towel between your skin and the heat source. ? Leave the heat on for 20-30 minutes. ? Remove the heat if your skin turns bright red. This is especially important if you are unable to feel pain, heat, or cold. You may have a greater risk of getting burned. Activity   Do not sit or rest in bed  for long periods of time.  Try to stay as active as possible. Ask your health care provider what type of exercise or activity is best for you.  Avoid activities that make your pain worse, such as bending and lifting.  Do not lift anything that is heavier than 10 lb (4.5 kg), or the limit that you are told, until your health care provider says that it is safe.  Practice using proper technique when lifting items. Proper lifting technique involves bending your knees and rising up.  Do strength and range-of-motion exercises only as told by your health care provider or physical therapist. General instructions  Take  over-the-counter and prescription medicines only as told by your health care provider.  Pay attention to any changes in your symptoms.  Keep all follow-up visits as told by your health care provider. This is important. ? Your health care provider may send you to a physical therapist to help with this pain. Contact a health care provider if:  Your pain and other symptoms get worse.  Your pain medicine is not helping.  Your pain has not improved after a few weeks of home care.  You have a fever. Get help right away if:  You have severe pain, weakness, or numbness.  You have difficulty with bladder or bowel control. Summary  Radicular pain is a type of pain that spreads from your back or neck along a spinal nerve.  When you have radicular pain, you may also have weakness, numbness, or tingling in the area of your body that is supplied by the nerve.  The pain may feel sharp or burning.  Radicular pain may be treated with ice, heat, medicines, or physical therapy. This information is not intended to replace advice given to you by your health care provider. Make sure you discuss any questions you have with your health care provider. Document Revised: 10/26/2017 Document Reviewed: 10/26/2017 Elsevier Patient Education  [place].

## 2019-06-14 NOTE — Progress Notes (Signed)
Kendra Gonzalez 49 y.o.   Chief Complaint  Patient presents with  . Leg Pain    swelling x 1 week, hx of hip surgery 2019    HISTORY OF PRESENT ILLNESS: This is a 49 y.o. female complaining of left lower leg pain for 1 week.  Denies injury.  No other associated symptoms. Sharp constant pain worse with movement, better with rest.  Drove herself to the office.  Able to ambulate. Denies bowel or bladder symptoms.  Denies numbness or tingling to the leg.  Denies neurological symptoms to the leg.  HPI   Prior to Admission medications   Medication Sig Start Date End Date Taking? Authorizing Provider  acetaminophen (TYLENOL) 500 MG tablet Take 500 mg by mouth every 6 (six) hours as needed.   Yes [provider]  albuterol (PROVENTIL HFA;VENTOLIN HFA) 108 (90 Base) MCG/ACT inhaler Inhale 2 puffs into the lungs every 6 (six) hours as needed for wheezing or shortness of breath. 04/02/17  Yes Jeffery, Chelle, PA  albuterol (PROVENTIL) (2.5 MG/3ML) 0.083% nebulizer solution Take 3 mLs (2.5 mg total) by nebulization every 6 (six) hours as needed for wheezing or shortness of breath. 04/10/17  Yes Jaynee Eagles, PA-C  Boric Acid POWD Place 600 mg vaginally as directed. Place 600 mg capsule intravaginally once a week as needed for irritation: Boric acid suppository 08/02/18  Yes Mouhamadou Gittleman, Ines Bloomer, MD  butalbital-acetaminophen-caffeine (FIORICET) [id number] MG tablet Take 1-2 tablets by mouth every 6 (six) hours as needed for headache. 05/29/19 05/28/20 Yes Nadene Witherspoon, Ines Bloomer, MD  diclofenac sodium (VOLTAREN) 1 % GEL Apply up to three times a day as needed to large joint. 07/18/18  Yes Garald Balding, MD  fluticasone-salmeterol (ADVAIR HFA) [phone] MCG/ACT inhaler Inhale 2 puffs into the lungs 2 (two) times daily. 04/02/17  Yes Jeffery, Chelle, PA  gabapentin (NEURONTIN) 300 MG capsule Take 1 capsule (300 mg total) by mouth 3 (three) times daily. 10/21/17  Yes Weber, Sarah L, PA-C  magnesium  gluconate (MAGONATE) 500 MG tablet Take 500 mg by mouth at bedtime.   Yes [provider]  Multiple Vitamins-Minerals (MULTIVITAMIN WITH MINERALS) tablet Take 1 tablet by mouth daily.   Yes [provider]  sucralfate (CARAFATE) 1 g tablet Take 1 tablet (1 g total) by mouth 4 (four) times daily. 02/05/19  Yes Hall-Potvin, [place], PA-C  SUMAtriptan (IMITREX) 50 MG tablet Take 1 tablet (50 mg total) by mouth every 2 (two) hours as needed for migraine. May repeat in 2 hours if headache persists or recurs. Not to exceed 200 mg in 24 h 05/27/19  Yes Avegno, Darrelyn Hillock, FNP  VOLTAREN 1 % GEL  09/13/15   [provider]  pantoprazole (PROTONIX) 40 MG tablet Take 1 tablet (40 mg total) by mouth daily. 09/16/17 11/03/18  Gale Journey, Damaris Hippo, PA-C    Allergies  Allergen Reactions  . Hydromorphone Itching    Patient Active Problem List   Diagnosis Date Noted  . Presence of left artificial hip joint 07/09/2017  . Hyperthyroidism 11/03/2016  . Multinodular goiter 04/17/2015  . History of stomach ulcers 03/22/2015  . Chronic pain of left heel 04/23/2014  . Chronic pain syndrome 03/19/2014  . Asthma 01/04/2013  . BMI 29.0-29.9,adult 10/12/2011  . 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 Hyperlipidemia 02/16/2007  . GERD 02/16/2007    Past Medical History:  Diagnosis Date  . Allergy   . Arthritis   . Asthma   . Endometriosis   . GERD (gastroesophageal reflux  disease)   . History of tetanus, diphtheria, and acellular pertussis booster vaccination (Tdap)    07/30/2010  . Hyperlipidemia   . Kidney stones   . Pancreatitis     Past Surgical History:  Procedure Laterality Date  . ABDOMINAL HYSTERECTOMY    . arthroscopic knee surgery     . carpel tunnel    . CHOLECYSTECTOMY    . ERCP  01/08/2012   Procedure: ENDOSCOPIC RETROGRADE CHOLANGIOPANCREATOGRAPHY (ERCP);  Surgeon: Beryle Beams, MD;  Location: [facility] ENDOSCOPY;  Service: Endoscopy;  Laterality: N/A;  . HIP SURGERY    .  oophrectomy  2011   secondary to cysts  . TUBAL LIGATION      Social History   Socioeconomic History  . Marital status: Married    Spouse name: Fritz Pickerel  . Number of children: 2  . Years of education: Not on file  . Highest education level: Not on file  Occupational History  . Occupation: Nursing Aid    Employer: [employer]  Tobacco Use  . Smoking status: Never Smoker  . Smokeless tobacco: Never Used  Substance and Sexual Activity  . Alcohol use: No  . Drug use: No  . Sexual activity: Yes    Birth control/protection: None  Other Topics Concern  . Not on file  Social History Narrative   Works as a Education officer, environmental.     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   Adult children live in [place], [place] and [place].   Social Determinants of Health   Financial Resource Strain:   . Difficulty of Paying Living Expenses: Not on file  Food Insecurity:   . Worried About Charity fundraiser in the Last Year: Not on file  . Ran Out of Food in the Last Year: Not on file  Transportation Needs:   . Lack of Transportation (Medical): Not on file  . Lack of Transportation (Non-Medical): Not on file  Physical Activity:   . Days of Exercise per Week: Not on file  . Minutes of Exercise per Session: Not on file  Stress:   . Feeling of Stress : Not on file  Social Connections:   . Frequency of Communication with Friends and Family: Not on file  . Frequency of Social Gatherings with Friends and Family: Not on file  . Attends Religious Services: Not on file  . Active Member of Clubs or Organizations: Not on file  . Attends Archivist Meetings: Not on file  . Marital Status: Not on file  Intimate Partner Violence:   . Fear of Current or Ex-Partner: Not on file  . Emotionally Abused: Not on file  . Physically Abused: Not on file  . Sexually Abused: Not on file    Family History  Problem Relation Age of Onset  . Heart disease Daughter   . Hypertension Father   . Hypertension Sister   .  Hypertension Brother   . Hypertension Maternal Grandfather   . Heart disease Maternal Grandfather   . Hypertension Paternal Grandfather   . Hypertension Brother   . Hypertension Sister   . Thyroid disease Neg Hx      Review of Systems  Constitutional: Negative.  Negative for chills and fever.  HENT: Negative.  Negative for congestion and sore throat.   Respiratory: Negative.  Negative for cough and shortness of breath.   Cardiovascular: Negative.  Negative for chest pain and palpitations.  Gastrointestinal: Negative.  Negative for abdominal pain, diarrhea, nausea and vomiting.  Genitourinary: Negative  for dysuria, flank pain, frequency and hematuria.  Musculoskeletal: Positive for joint pain (Chronic left hip pain).  Skin: Negative.  Negative for rash.  Neurological: Positive for headaches (Chronic migraine headaches). Negative for dizziness, tingling, sensory change, speech change, focal weakness, seizures and loss of consciousness.  Endo/Heme/Allergies: Negative.   All other systems reviewed and are negative.   Vitals:   06/14/19 1602  BP: (!) 141/91  Pulse: 89  Temp: 97.7 F (36.5 C)  SpO2: 100%    Physical Exam Vitals reviewed.  Constitutional:      Appearance: Normal appearance.  HENT:     Head: Normocephalic.  Eyes:     Extraocular Movements: Extraocular movements intact.     Pupils: Pupils are equal, round, and reactive to light.  Cardiovascular:     Rate and Rhythm: Normal rate and regular rhythm.     Heart sounds: Normal heart sounds.  Pulmonary:     Effort: Pulmonary effort is normal.     Breath sounds: Normal breath sounds.  Abdominal:     General: Bowel sounds are normal. There is no distension.     Palpations: Abdomen is soft.     Tenderness: There is no abdominal tenderness.  Musculoskeletal:        General: No swelling, deformity or signs of injury.     Cervical back: Normal range of motion.     Right lower leg: No edema.     Left lower leg: No  edema.     Comments: Left lower extremity: Neurovascularly intact distally.  No erythema or bruising.  No swelling.  Mild tenderness to outer part of lower leg.  Full range of motion at hip, knee, and ankle levels.  No signs of DVT.  Skin:    General: Skin is warm and dry.     Capillary Refill: Capillary refill takes less than 2 seconds.  Neurological:     General: No focal deficit present.     Mental Status: She is alert and oriented to person, place, and time.     Sensory: No sensory deficit.     Motor: No weakness.     Coordination: Coordination normal.     Deep Tendon Reflexes: Reflexes normal.  Psychiatric:        Mood and Affect: Mood normal.        Behavior: Behavior normal.    A total of 30 minutes was spent with the patient, greater than 50% of which was in counseling/coordination of care regarding differential diagnosis, treatment, review of all medications, review of new analgesic medication prescribed, ED precautions, need to follow-up with her orthopedist, prognosis and need for follow-up.   ASSESSMENT & PLAN: Mayola was seen today for leg pain.  Diagnoses and all orders for this visit:  Left leg pain -     ketorolac (TORADOL) injection 60 mg -     traMADol (ULTRAM) 50 MG tablet; Take 1 tablet (50 mg total) by mouth every 8 (eight) hours as needed for up to 5 days.  Musculoskeletal pain  Chronic left hip pain    Patient Instructions       If you have lab work done today you will be contacted with your lab results within the next 2 weeks.  If you have not heard from [place] then please contact us. The fastest way to get your results is to register for My Chart.   IF you received an x-ray today, you will receive an invoice from [facility]. Please contact [facility]  at [phone] with questions or concerns regarding your invoice.   IF you received labwork today, you will receive an invoice from [facility]. Please contact [facility] at  [phone] with questions or concerns regarding your invoice.   Our billing staff will not be able to assist you with questions regarding bills from these companies.  You will be contacted with the lab results as soon as they are available. The fastest way to get your results is to activate your My Chart account. Instructions are located on the last page of this paperwork. If you have not heard from [place] regarding the results in 2 weeks, please contact this office.      Radicular Pain Radicular pain is a type of pain that spreads from your back or neck along a spinal nerve. Spinal nerves are nerves that leave the spinal cord and go to the muscles. Radicular pain is sometimes called radiculopathy, radiculitis, or a pinched nerve. When you have this type of pain, you may also have weakness, numbness, or tingling in the area of your body that is supplied by the nerve. The pain may feel sharp and burning. Depending on which spinal nerve is affected, the pain may occur in the:  Neck area (cervical radicular pain). You may also feel pain, numbness, weakness, or tingling in the arms.  Mid-spine area (thoracic radicular pain). You would feel this pain in the back and chest. This type is rare.  Lower back area (lumbar radicular pain). You would feel this pain as low back pain. You may feel pain, numbness, weakness, or tingling in the buttocks or legs. Sciatica is a type of lumbar radicular pain that shoots down the back of the leg. Radicular pain occurs when one of the spinal nerves becomes irritated or squeezed (compressed). It is often caused by something pushing on a spinal nerve, such as one of the bones of the spine (vertebrae) or one of the round cushions between vertebrae (intervertebral disks). This can result from:  An injury.  Wear and tear or aging of a disk.  The growth of a bone spur that pushes on the nerve. Radicular pain often goes away when you follow instructions from your health  care provider for relieving pain at home. Follow these instructions at home: Managing pain      If directed, put ice on the affected area: ? Put ice in a plastic bag. ? Place a towel between your skin and the bag. ? Leave the ice on for 20 minutes, 2-3 times a day.  If directed, apply heat to the affected area as often as told by your health care provider. Use the heat source that your health care provider recommends, such as a moist heat pack or a heating pad. ? Place a towel between your skin and the heat source. ? Leave the heat on for 20-30 minutes. ? Remove the heat if your skin turns bright red. This is especially important if you are unable to feel pain, heat, or cold. You may have a greater risk of getting burned. Activity   Do not sit or rest in bed for long periods of time.  Try to stay as active as possible. Ask your health care provider what type of exercise or activity is best for you.  Avoid activities that make your pain worse, such as bending and lifting.  Do not lift anything that is heavier than 10 lb (4.5 kg), or the limit that you are told, until your health care provider says  that it is safe.  Practice using proper technique when lifting items. Proper lifting technique involves bending your knees and rising up.  Do strength and range-of-motion exercises only as told by your health care provider or physical therapist. General instructions  Take over-the-counter and prescription medicines only as told by your health care provider.  Pay attention to any changes in your symptoms.  Keep all follow-up visits as told by your health care provider. This is important. ? Your health care provider may send you to a physical therapist to help with this pain. Contact a health care provider if:  Your pain and other symptoms get worse.  Your pain medicine is not helping.  Your pain has not improved after a few weeks of home care.  You have a fever. Get help right  away if:  You have severe pain, weakness, or numbness.  You have difficulty with bladder or bowel control. Summary  Radicular pain is a type of pain that spreads from your back or neck along a spinal nerve.  When you have radicular pain, you may also have weakness, numbness, or tingling in the area of your body that is supplied by the nerve.  The pain may feel sharp or burning.  Radicular pain may be treated with ice, heat, medicines, or physical therapy. This information is not intended to replace advice given to you by your health care provider. Make sure you discuss any questions you have with your health care provider. Document Revised: 10/26/2017 Document Reviewed: 10/26/2017 Elsevier Patient Education  2020 [employer] Inc.      Agustina Caroli, MD [facility]

## 2019-06-26 ENCOUNTER — Other Ambulatory Visit: Payer: Self-pay | Admitting: Rehabilitation

## 2019-06-26 DIAGNOSIS — D496 Neoplasm of unspecified behavior of brain: Secondary | ICD-10-CM

## 2019-07-12 ENCOUNTER — Other Ambulatory Visit: Payer: Self-pay

## 2019-07-12 ENCOUNTER — Ambulatory Visit
Admission: EM | Admit: 2019-07-12 | Discharge: 2019-07-12 | Disposition: A | Discharge status: Home / Self Care | Payer: 59 | Attending: Emergency Medicine | Admitting: Emergency Medicine

## 2019-07-12 DIAGNOSIS — R682 Dry mouth, unspecified: Secondary | ICD-10-CM | POA: Diagnosis not present

## 2019-07-12 DIAGNOSIS — R110 Nausea: Secondary | ICD-10-CM

## 2019-07-12 DIAGNOSIS — Z20822 Contact with and (suspected) exposure to covid-19: Secondary | ICD-10-CM

## 2019-07-12 DIAGNOSIS — G43809 Other migraine, not intractable, without status migrainosus: Secondary | ICD-10-CM | POA: Diagnosis not present

## 2019-07-12 LAB — POCT FASTING CBG KUC MANUAL ENTRY: POCT Glucose (KUC): 96 mg/dL (ref 70–99)

## 2019-07-12 MED ORDER — DEXAMETHASONE SODIUM PHOSPHATE 10 MG/ML IJ SOLN
10.0000 mg | Freq: Once | INTRAMUSCULAR | Status: AC
  Administered 2019-07-12: 10 mg via INTRAMUSCULAR

## 2019-07-12 MED ORDER — OMEPRAZOLE 20 MG PO CPDR: 20.0000 mg | DELAYED_RELEASE_CAPSULE | Freq: Every day | ORAL | 0 refills | Status: DC

## 2019-07-12 MED ORDER — KETOROLAC TROMETHAMINE 60 MG/2ML IM SOLN
60.0000 mg | Freq: Once | INTRAMUSCULAR | Status: AC
  Administered 2019-07-12: 60 mg via INTRAMUSCULAR

## 2019-07-12 MED ORDER — ONDANSETRON 4 MG PO TBDP
4.0000 mg | ORAL_TABLET | Freq: Once | ORAL | Status: AC
  Administered 2019-07-12: 4 mg via ORAL

## 2019-07-12 NOTE — ED Triage Notes (Signed)
Pt c/o nausea and weakness x2 days. States she drinks plenty of water but feels dehydrated. Pt states woke up this am with a headache.

## 2019-07-12 NOTE — ED Provider Notes (Signed)
[name]    CSN: [id number] Arrival date & time: 07/12/19  1126      History   Chief Complaint Chief Complaint  Patient presents with  . Nausea    HPI Kendra Gonzalez is a 49 y.o. female with history of GERD, endometriosis, allergies and asthma presenting for 2-day course of nausea and fatigue/weakness.  Patient states that she has been drinking more water, though still has a dry mouth.  Patient denying polyuria, dysuria, hematuria.  No vomiting, abdominal pain, fever, chills, cough, shortness of breath.  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  Patient does endorse history of seasonal allergies for which she takes an antihistamine.  Patient is s/p hysterectomy: Denies vaginal bleeding, hematochezia, melena, easy bruising/bleeding.  Patient has not eaten today, though reports being able to keep down fluids without increasing nausea.  Patient does endorse migraine since this morning.  States it is her normal: No thunderclap headache, worst headache of life, personal or family history of aneurysm.   Past Medical History:  Diagnosis Date  . Allergy   . Arthritis   . Asthma   . Endometriosis   . GERD (gastroesophageal reflux disease)   . History of tetanus, diphtheria, and acellular pertussis booster vaccination (Tdap)    07/30/2010  . Hyperlipidemia   . Kidney stones   . Pancreatitis     Patient Active Problem List   Diagnosis Date Noted  . Presence of left artificial hip joint 07/09/2017  . Hyperthyroidism 11/03/2016  . Multinodular goiter 04/17/2015  . History of stomach ulcers 03/22/2015  . Chronic pain of left heel 04/23/2014  . Chronic pain syndrome 03/19/2014  . Asthma 01/04/2013  . BMI 29.0-29.9,adult 10/12/2011  . 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 Hyperlipidemia 02/16/2007  . GERD 02/16/2007    Past Surgical History:  Procedure Laterality Date  . ABDOMINAL HYSTERECTOMY    . arthroscopic knee surgery      . carpel tunnel    . CHOLECYSTECTOMY    . ERCP  01/08/2012   Procedure: ENDOSCOPIC RETROGRADE CHOLANGIOPANCREATOGRAPHY (ERCP);  Surgeon: Beryle Beams, MD;  Location: [facility] ENDOSCOPY;  Service: Endoscopy;  Laterality: N/A;  . HIP SURGERY    . oophrectomy  2011   secondary to cysts  . TUBAL LIGATION      OB History   No obstetric history on file.      Home Medications    Prior to Admission medications   Medication Sig Start Date End Date Taking? Authorizing Provider  acetaminophen (TYLENOL) 500 MG tablet Take 500 mg by mouth every 6 (six) hours as needed.    [provider]  albuterol (PROVENTIL HFA;VENTOLIN HFA) 108 (90 Base) MCG/ACT inhaler Inhale 2 puffs into the lungs every 6 (six) hours as needed for wheezing or shortness of breath. 04/02/17   Harrison Mons, PA  albuterol (PROVENTIL) (2.5 MG/3ML) 0.083% nebulizer solution Take 3 mLs (2.5 mg total) by nebulization every 6 (six) hours as needed for wheezing or shortness of breath. 04/10/17   Jaynee Eagles, PA-C  Boric Acid POWD Place 600 mg vaginally as directed. Place 600 mg capsule intravaginally once a week as needed for irritation: Boric acid suppository 08/02/18   Horald Pollen, MD  butalbital-acetaminophen-caffeine (FIORICET) [phone] MG tablet Take 1-2 tablets by mouth every 6 (six) hours as needed for headache. 05/29/19 05/28/20  Horald Pollen, MD  diclofenac sodium (VOLTAREN) 1 % GEL Apply up to three times  a day as needed to large joint. 07/18/18   Garald Balding, MD  fluticasone-salmeterol (ADVAIR HFA) [phone] MCG/ACT inhaler Inhale 2 puffs into the lungs 2 (two) times daily. 04/02/17   Harrison Mons, PA  gabapentin (NEURONTIN) 300 MG capsule Take 1 capsule (300 mg total) by mouth 3 (three) times daily. 10/21/17   Weber, Damaris Hippo, PA-C  magnesium gluconate (MAGONATE) 500 MG tablet Take 500 mg by mouth at bedtime.    [provider]  Multiple Vitamins-Minerals (MULTIVITAMIN WITH MINERALS) tablet  Take 1 tablet by mouth daily.    [provider]  omeprazole (PRILOSEC) 20 MG capsule Take 1 capsule (20 mg total) by mouth daily. 07/12/19   Hall-Potvin, [place], PA-C  sucralfate (CARAFATE) 1 g tablet Take 1 tablet (1 g total) by mouth 4 (four) times daily. 02/05/19   Hall-Potvin, [place], PA-C  SUMAtriptan (IMITREX) 50 MG tablet Take 1 tablet (50 mg total) by mouth every 2 (two) hours as needed for migraine. May repeat in 2 hours if headache persists or recurs. Not to exceed 200 mg in 24 h 05/27/19   Avegno, Darrelyn Hillock, FNP  pantoprazole (PROTONIX) 40 MG tablet Take 1 tablet (40 mg total) by mouth daily. 09/16/17 11/03/18  Mancel Bale, PA-C    Family History Family History  Problem Relation Age of Onset  . Heart disease Daughter   . Hypertension Father   . Hypertension Sister   . Hypertension Brother   . Hypertension Maternal Grandfather   . Heart disease Maternal Grandfather   . Hypertension Paternal Grandfather   . Hypertension Brother   . Hypertension Sister   . Thyroid disease Neg Hx     Social History Social History   Tobacco Use  . Smoking status: Never Smoker  . Smokeless tobacco: Never Used  Substance Use Topics  . Alcohol use: No  . Drug use: No     Allergies   Hydromorphone   Review of Systems As per HPI   Physical Exam Triage Vital Signs ED Triage Vitals  Enc Vitals Group     BP      Pulse      Resp      Temp      Temp src      SpO2      Weight      Height      Head Circumference      Peak Flow      Pain Score      Pain Loc      Pain Edu?      Excl. in [place]?    Orthostatic VS for the past 24 hrs:  BP- Lying Pulse- Lying BP- Sitting Pulse- Sitting BP- Standing at 0 minutes Pulse- Standing at 0 minutes  07/12/19 1245 125/81 84 (!) 135/94 86 (!) 139/105 92    Updated Vital Signs BP (!) 142/87 (BP Location: Left Arm)   Pulse 87   Temp 98.5 F (36.9 C) (Oral)   Resp 16   LMP 06/06/1996   SpO2 98%   Visual Acuity Right Eye  Distance:   Left Eye Distance:   Bilateral Distance:    Right Eye Near:   Left Eye Near:    Bilateral Near:     Physical Exam Constitutional:      General: She is not in acute distress. HENT:     Head: Normocephalic and atraumatic.     Right Ear: Tympanic membrane, ear canal and external ear normal.  Left Ear: Tympanic membrane, ear canal and external ear normal.     Nose: Nose normal.     Mouth/Throat:     Mouth: Mucous membranes are moist.     Pharynx: Oropharynx is clear. No oropharyngeal exudate or posterior oropharyngeal erythema.     Comments: No oral lesions, Candida Eyes:     General: No scleral icterus.    Conjunctiva/sclera: Conjunctivae normal.     Pupils: Pupils are equal, round, and reactive to light.  Cardiovascular:     Rate and Rhythm: Normal rate and regular rhythm.     Heart sounds: No murmur. No gallop.   Pulmonary:     Effort: Pulmonary effort is normal. No respiratory distress.     Breath sounds: No wheezing or rales.  Abdominal:     General: Bowel sounds are normal.     Tenderness: There is no abdominal tenderness.  Musculoskeletal:        General: Normal range of motion.     Cervical back: Neck supple. No tenderness.     Right lower leg: No edema.     Left lower leg: No edema.  Lymphadenopathy:     Cervical: No cervical adenopathy.  Skin:    Capillary Refill: Capillary refill takes less than 2 seconds.     Coloration: Skin is not jaundiced or pale.     Findings: No rash.  Neurological:     General: No focal deficit present.     Mental Status: She is alert and oriented to person, place, and time.      UC Treatments / Results  Labs (all labs ordered are listed, but only abnormal results are displayed) Labs Reviewed  POCT FASTING CBG K[facility] MANUAL ENTRY - Normal  NOVEL CORONAVIRUS, NAA    EKG   Radiology No results found.  Procedures Procedures (including critical care time)  Medications Ordered in UC Medications  ketorolac  (TORADOL) injection 60 mg (60 mg Intramuscular Given 07/12/19 1334)  dexamethasone (DECADRON) injection 10 mg (10 mg Intramuscular Given 07/12/19 1334)  ondansetron (ZOFRAN-ODT) disintegrating tablet 4 mg (4 mg Oral Given 07/12/19 1336)    Initial Impression / Assessment and Plan / UC Course  I have reviewed the triage vital signs and the nursing notes.  Pertinent labs & imaging results that were available during my care of the patient were reviewed by me and considered in my medical decision making (see chart for details).     Patient afebrile, nontoxic, and without neurocognitive deficit in office today.  CBG 96.  No signs/symptoms concerning for acute blood loss.  Patient without urinary symptoms, appears hydrated in office today.  Given patient's profession, workplace exposure will obtain Covid test and have patient quarantine until results are back.  Patient given Toradol, Decadron, Zofran for migraine: Reporting improvement in symptoms at time of discharge.  Will have patient take PPI daily given history of GERD as this could be contributing to nausea.  Return precautions discussed, patient verbalized understanding and is agreeable to plan. Final Clinical Impressions(s) / UC Diagnoses   Final diagnoses:  Nausea without vomiting  Other migraine without status migrainosus, not intractable  Dry mouth     Discharge Instructions     Your COVID test is pending - it is important to quarantine / isolate at home until your results are back. If you test positive and would like further evaluation for persistent or worsening symptoms, you may schedule an E-visit or virtual (video) visit throughout the [facility] or  website.  PLEASE NOTE: If you develop severe chest pain or shortness of breath please go to the ER or call 9-1-1 for further evaluation --> DO NOT schedule electronic or virtual visits for this. Please call our office for further guidance / recommendations as needed.  For  information about the Covid vaccine, please visit [email or web address]    ED Prescriptions    Medication Sig Dispense Auth. Provider   omeprazole (PRILOSEC) 20 MG capsule Take 1 capsule (20 mg total) by mouth daily. 30 capsule Hall-Potvin, [place], PA-C     PDMP not reviewed this encounter.   Neldon Mc Griffith, [place] 07/12/19 [id number]

## 2019-07-12 NOTE — Discharge Instructions (Addendum)
Your COVID test is pending - it is important to quarantine / isolate at home until your results are back. °If you test positive and would like further evaluation for persistent or worsening symptoms, you may schedule an E-visit or virtual (video) visit throughout the [facility] MyChart app or website. ° °PLEASE NOTE: If you develop severe chest pain or shortness of breath please go to the ER or call 9-1-1 for further evaluation --> DO NOT schedule electronic or virtual visits for this. °Please call our office for further guidance / recommendations as needed. ° °For information about the Covid vaccine, please visit [email or web address] °

## 2019-07-13 LAB — NOVEL CORONAVIRUS, NAA: SARS-CoV-2, NAA: NOT DETECTED

## 2019-07-18 ENCOUNTER — Ambulatory Visit
Admission: RE | Admit: 2019-07-18 | Discharge: 2019-07-18 | Disposition: A | Discharge status: Home / Self Care | Payer: 59 | Source: Ambulatory Visit | Attending: Rehabilitation | Admitting: Rehabilitation

## 2019-07-18 ENCOUNTER — Other Ambulatory Visit: Payer: Self-pay

## 2019-07-18 DIAGNOSIS — D496 Neoplasm of unspecified behavior of brain: Secondary | ICD-10-CM

## 2019-07-18 MED ORDER — GADOBENATE DIMEGLUMINE 529 MG/ML IV SOLN
18.0000 mL | Freq: Once | INTRAVENOUS | Status: AC | PRN
  Administered 2019-07-18: 18 mL via INTRAVENOUS

## 2019-07-27 ENCOUNTER — Other Ambulatory Visit: Payer: Self-pay | Admitting: Rehabilitation

## 2019-07-27 DIAGNOSIS — M546 Pain in thoracic spine: Secondary | ICD-10-CM

## 2019-07-27 DIAGNOSIS — M5126 Other intervertebral disc displacement, lumbar region: Secondary | ICD-10-CM

## 2019-07-27 DIAGNOSIS — M542 Cervicalgia: Secondary | ICD-10-CM

## 2019-08-08 NOTE — Telephone Encounter (Signed)
No additional notes

## 2019-08-19 ENCOUNTER — Ambulatory Visit
Admission: RE | Admit: 2019-08-19 | Discharge: 2019-08-19 | Disposition: A | Discharge status: Home / Self Care | Payer: 59 | Source: Ambulatory Visit | Attending: Rehabilitation | Admitting: Rehabilitation

## 2019-08-19 DIAGNOSIS — M5126 Other intervertebral disc displacement, lumbar region: Secondary | ICD-10-CM

## 2019-08-19 DIAGNOSIS — M542 Cervicalgia: Secondary | ICD-10-CM

## 2019-08-19 DIAGNOSIS — M546 Pain in thoracic spine: Secondary | ICD-10-CM

## 2019-08-31 ENCOUNTER — Other Ambulatory Visit: Payer: Self-pay

## 2019-08-31 MED ORDER — DICLOFENAC SODIUM 1 % EX GEL: CUTANEOUS | 0 refills | Status: DC

## 2019-09-06 ENCOUNTER — Other Ambulatory Visit: Payer: Self-pay

## 2019-09-06 ENCOUNTER — Telehealth: Payer: Self-pay

## 2019-09-06 MED ORDER — DICLOFENAC SODIUM 1 % EX GEL: CUTANEOUS | 0 refills | Status: DC

## 2019-09-06 NOTE — Telephone Encounter (Signed)
Called and notified patient that insurance will not cover her Diclofenac gel. She is aware that she can get this over the counter.

## 2019-09-19 ENCOUNTER — Other Ambulatory Visit: Payer: Self-pay

## 2019-09-19 ENCOUNTER — Ambulatory Visit (INDEPENDENT_AMBULATORY_CARE_PROVIDER_SITE_OTHER): Payer: 59 | Admitting: Internal Medicine

## 2019-09-19 ENCOUNTER — Encounter: Payer: Self-pay | Admitting: Internal Medicine

## 2019-09-19 VITALS — BP 110/70 | HR 94 | Temp 97.2°F | Ht 64.0 in | Wt 197.2 lb

## 2019-09-19 DIAGNOSIS — E785 Hyperlipidemia, unspecified: Secondary | ICD-10-CM | POA: Diagnosis not present

## 2019-09-19 DIAGNOSIS — Z23 Encounter for immunization: Secondary | ICD-10-CM

## 2019-09-19 DIAGNOSIS — Z1211 Encounter for screening for malignant neoplasm of colon: Secondary | ICD-10-CM | POA: Diagnosis not present

## 2019-09-19 DIAGNOSIS — J4530 Mild persistent asthma, uncomplicated: Secondary | ICD-10-CM

## 2019-09-19 DIAGNOSIS — K219 Gastro-esophageal reflux disease without esophagitis: Secondary | ICD-10-CM | POA: Diagnosis not present

## 2019-09-19 DIAGNOSIS — E0590 Thyrotoxicosis, unspecified without thyrotoxic crisis or storm: Secondary | ICD-10-CM

## 2019-09-19 MED ORDER — ALBUTEROL SULFATE HFA 108 (90 BASE) MCG/ACT IN AERS: 2.0000 | INHALATION_SPRAY | Freq: Four times a day (QID) | RESPIRATORY_TRACT | 2 refills | Status: DC | PRN

## 2019-09-19 MED ORDER — ADVAIR HFA 115-21 MCG/ACT IN AERO: 2.0000 | INHALATION_SPRAY | Freq: Two times a day (BID) | RESPIRATORY_TRACT | 2 refills | Status: DC

## 2019-09-19 MED ORDER — ALBUTEROL SULFATE (2.5 MG/3ML) 0.083% IN NEBU: 2.5000 mg | INHALATION_SOLUTION | Freq: Four times a day (QID) | RESPIRATORY_TRACT | 1 refills | Status: DC | PRN

## 2019-09-19 NOTE — Progress Notes (Signed)
New Patient Office Visit     This visit occurred during the SARS-CoV-2 public health emergency.  Safety protocols were in place, including screening questions prior to the visit, additional usage of staff PPE, and extensive cleaning of exam room while observing appropriate contact time as indicated for disinfecting solutions.    CC/Reason for Visit: Establish care, discuss chronic medical conditions Previous PCP: Agustina Caroli, MD Last Visit: February 2021  HPI: Kendra Gonzalez is a 49 y.o. female who is coming in today for the above mentioned reasons. Past Medical History is significant for: Obesity, migraine headaches, asthma well-controlled on Advair and as needed albuterol, history of hyperlipidemia and hypothyroidism but not on any medications for these last 2.  She has no known drug allergies, past surgical history significant for left hip replacement, hysterectomy, bilateral carpal tunnel release surgery, she does not smoke, she does not drink.  Her family history significant for mother with endometrial cancer and a maternal grandfather with coronary artery disease and congestive heart failure.  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  She has had both of her Covid vaccines.  Is requesting shingles vaccination today.  She had a mammogram in October 2020, she is overdue for screening colonoscopy.   Past Medical/Surgical History: Past Medical History:  Diagnosis Date  . Allergy   . Arthritis   . Asthma   . Endometriosis   . GERD (gastroesophageal reflux disease)   . History of tetanus, diphtheria, and acellular pertussis booster vaccination (Tdap)    07/30/2010  . Hyperlipidemia   . Kidney stones   . Pancreatitis     Past Surgical History:  Procedure Laterality Date  . ABDOMINAL HYSTERECTOMY    . arthroscopic knee surgery     . carpel tunnel    . CHOLECYSTECTOMY    . ERCP  01/08/2012   Procedure: ENDOSCOPIC RETROGRADE  CHOLANGIOPANCREATOGRAPHY (ERCP);  Surgeon: Beryle Beams, MD;  Location: [facility] ENDOSCOPY;  Service: Endoscopy;  Laterality: N/A;  . HIP SURGERY    . oophrectomy  2011   secondary to cysts  . TUBAL LIGATION      Social History:  reports that she has never smoked. She has never used smokeless tobacco. She reports that she does not drink alcohol or use drugs.  Allergies: Allergies  Allergen Reactions  . Hydromorphone Itching    Family History:  Family History  Problem Relation Age of Onset  . Heart disease Daughter   . Hypertension Father   . Hypertension Sister   . Hypertension Brother   . Hypertension Maternal Grandfather   . Heart disease Maternal Grandfather   . Hypertension Paternal Grandfather   . Hypertension Brother   . Hypertension Sister   . Thyroid disease Neg Hx      Current Outpatient Medications:  .  acetaminophen (TYLENOL) 500 MG tablet, Take 500 mg by mouth every 6 (six) hours as needed., Disp: , Rfl:  .  albuterol (PROVENTIL) (2.5 MG/3ML) 0.083% nebulizer solution, Take 3 mLs (2.5 mg total) by nebulization every 6 (six) hours as needed for wheezing or shortness of breath., Disp: 150 mL, Rfl: 1 .  albuterol (VENTOLIN HFA) 108 (90 Base) MCG/ACT inhaler, Inhale 2 puffs into the lungs every 6 (six) hours as needed for wheezing or shortness of breath., Disp: 18 g, Rfl: 2 .  Boric Acid POWD, Place 600 mg vaginally as directed. Place 600 mg capsule intravaginally once a week as  needed for irritation: Boric acid suppository, Disp: 113 g, Rfl: 0 .  Cyanocobalamin 5000 MCG/ML LIQD, Place under the tongue., Disp: , Rfl:  .  diclofenac sodium (VOLTAREN) 1 % GEL, Apply up to three times a day as needed to large joint., Disp: 3 Tube, Rfl: 4 .  fluticasone-salmeterol (ADVAIR HFA) 115-21 MCG/ACT inhaler, Inhale 2 puffs into the lungs 2 (two) times daily., Disp: 1 Inhaler, Rfl: 2 .  gabapentin (NEURONTIN) 300 MG capsule, Take 1 capsule (300 mg total) by mouth 3 (three) times  daily., Disp: 90 capsule, Rfl: 0 .  magnesium gluconate (MAGONATE) 500 MG tablet, Take 500 mg by mouth at bedtime., Disp: , Rfl:  .  Multiple Vitamins-Minerals (MULTIVITAMIN WITH MINERALS) tablet, Take 1 tablet by mouth daily., Disp: , Rfl:  .  SUMAtriptan (IMITREX) 50 MG tablet, Take 1 tablet (50 mg total) by mouth every 2 (two) hours as needed for migraine. May repeat in 2 hours if headache persists or recurs. Not to exceed 200 mg in 24 h, Disp: 10 tablet, Rfl: 0  Review of Systems:  Constitutional: Denies fever, chills, diaphoresis, appetite change and fatigue.  HEENT: Denies photophobia, eye pain, redness, hearing loss, ear pain, congestion, sore throat, rhinorrhea, sneezing, mouth sores, trouble swallowing, neck pain, neck stiffness and tinnitus.   Respiratory: Denies SOB, DOE, cough, chest tightness,  and wheezing.   Cardiovascular: Denies chest pain, palpitations and leg swelling.  Gastrointestinal: Denies nausea, vomiting, abdominal pain, diarrhea, constipation, blood in stool and abdominal distention.  Genitourinary: Denies dysuria, urgency, frequency, hematuria, flank pain and difficulty urinating.  Endocrine: Denies: hot or cold intolerance, sweats, changes in hair or nails, polyuria, polydipsia. Musculoskeletal: Denies myalgias, back pain, joint swelling, arthralgias and gait problem.  Skin: Denies pallor, rash and wound.  Neurological: Denies dizziness, seizures, syncope, weakness, light-headedness, numbness and headaches.  Hematological: Denies adenopathy. Easy bruising, personal or family bleeding history  Psychiatric/Behavioral: Denies suicidal ideation, mood changes, confusion, nervousness, sleep disturbance and agitation    Physical Exam: Vitals:   09/19/19 0901  BP: 110/70  Pulse: 94  Temp: (!) 97.2 F (36.2 C)  TempSrc: Temporal  SpO2: 98%  Weight: 197 lb 3.2 oz (89.4 kg)  Height: 5\' 4"  (1.626 m)   Body mass index is 33.85 kg/m.  Constitutional: NAD, calm,  comfortable Eyes: PERRL, lids and conjunctivae normal ENMT: Mucous membranes are moist.  Respiratory: clear to auscultation bilaterally, no wheezing, no crackles. Normal respiratory effort. No accessory muscle use.  Cardiovascular: Regular rate and rhythm, no murmurs / rubs / gallops. No extremity edema.  Neurologic: Grossly intact and nonfocal Psychiatric: Normal judgment and insight. Alert and oriented x 3. Normal mood.    Impression and Plan:  Screening for malignant neoplasm of colon  - Plan: Ambulatory referral to Gastroenterology  Mild persistent asthma without complication - -refill Advair and albuterol.  Hyperlipidemia, unspecified hyperlipidemia type -Check lipids when she returns for physical.  Gastroesophageal reflux disease without esophagitis -Well-controlled, not on daily PPI therapy.  Hyperthyroidism -Check TSH when she returns for physical.     Patient Instructions  -Nice seeing you today!!  -First shingles vaccine today.  -Follow up in 6 months for your physical. Please come in fasting that day.     Lelon Frohlich, MD [facility] at [facility]

## 2019-09-19 NOTE — Patient Instructions (Signed)
-  Nice seeing you today!!  -First shingles vaccine today.  -Follow up in 6 months for your physical. Please come in fasting that day.

## 2019-09-19 NOTE — Addendum Note (Signed)
Addended by: Westley Hummer B on: 09/19/2019 05:12 PM   Modules accepted: Orders

## 2019-09-20 ENCOUNTER — Encounter: Payer: Self-pay | Admitting: Gastroenterology

## 2019-10-13 ENCOUNTER — Other Ambulatory Visit: Payer: Self-pay | Admitting: Orthopaedic Surgery

## 2019-10-13 DIAGNOSIS — Z96642 Presence of left artificial hip joint: Secondary | ICD-10-CM

## 2019-10-13 DIAGNOSIS — Z471 Aftercare following joint replacement surgery: Secondary | ICD-10-CM

## 2019-10-27 ENCOUNTER — Ambulatory Visit (AMBULATORY_SURGERY_CENTER): Payer: Self-pay | Admitting: *Deleted

## 2019-10-27 ENCOUNTER — Other Ambulatory Visit: Payer: Self-pay

## 2019-10-27 VITALS — Ht 64.0 in | Wt 197.0 lb

## 2019-10-27 DIAGNOSIS — Z1211 Encounter for screening for malignant neoplasm of colon: Secondary | ICD-10-CM

## 2019-10-27 MED ORDER — SUTAB 1479-225-188 MG PO TABS: 1.0000 | ORAL_TABLET | ORAL | 0 refills | Status: DC

## 2019-10-27 NOTE — Progress Notes (Signed)
Patient is here in-person for PV. Patient denies any allergies to eggs or soy. Patient denies any problems with anesthesia/sedation. Patient denies any oxygen use at home. Patient denies taking any diet/weight loss medications or blood thinners. Patient is not being treated for MRSA or C-diff. Patient is aware of our care-partner policy and ADGNP-54 safety protocol. EMMI education assisgned to the patient for the procedure, this was explained and instructions given to patient.   COVID-19 vaccines completed  05/27/19 per pt. Prep Prescription coupon given to the patient.

## 2019-11-03 ENCOUNTER — Encounter: Payer: Self-pay | Admitting: Gastroenterology

## 2019-11-10 ENCOUNTER — Other Ambulatory Visit: Payer: Self-pay

## 2019-11-10 ENCOUNTER — Ambulatory Visit (AMBULATORY_SURGERY_CENTER): Payer: 59 | Admitting: Gastroenterology

## 2019-11-10 ENCOUNTER — Encounter: Payer: Self-pay | Admitting: Gastroenterology

## 2019-11-10 VITALS — BP 138/85 | HR 62 | Temp 96.9°F | Resp 15 | Ht 64.0 in | Wt 197.0 lb

## 2019-11-10 DIAGNOSIS — Z1211 Encounter for screening for malignant neoplasm of colon: Secondary | ICD-10-CM

## 2019-11-10 MED ORDER — SODIUM CHLORIDE 0.9 % IV SOLN: 500.0000 mL | Freq: Once | INTRAVENOUS | Status: DC

## 2019-11-10 NOTE — Progress Notes (Signed)
Pt's states no medical or surgical changes since previsit or office visit.  VS CW  

## 2019-11-10 NOTE — Patient Instructions (Signed)
YOU HAD AN ENDOSCOPIC PROCEDURE TODAY AT THE [facility]:   Refer to the procedure report that was given to you for any specific questions about what was found during the examination.  If the procedure report does not answer your questions, please call your gastroenterologist to clarify.  If you requested that your care partner not be given the details of your procedure findings, then the procedure report has been included in a sealed envelope for you to review at your convenience later.  YOU SHOULD EXPECT: Some feelings of bloating in the abdomen. Passage of more gas than usual.  Walking can help get rid of the air that was put into your GI tract during the procedure and reduce the bloating. If you had a lower endoscopy (such as a colonoscopy or flexible sigmoidoscopy) you may notice spotting of blood in your stool or on the toilet paper. If you underwent a bowel prep for your procedure, you may not have a normal bowel movement for a few days.  Please Note:  You might notice some irritation and congestion in your nose or some drainage.  This is from the oxygen used during your procedure.  There is no need for concern and it should clear up in a day or so.  SYMPTOMS TO REPORT IMMEDIATELY:   Following lower endoscopy (colonoscopy or flexible sigmoidoscopy):  Excessive amounts of blood in the stool  Significant tenderness or worsening of abdominal pains  Swelling of the abdomen that is new, acute  Fever of 100F or higher  For urgent or emergent issues, a gastroenterologist can be reached at any hour by calling [phone]. Do not use MyChart messaging for urgent concerns.    DIET:  We do recommend a small meal at first, but then you may proceed to your regular diet.  Drink plenty of fluids but you should avoid alcoholic beverages for 24 hours.  ACTIVITY:  You should plan to take it easy for the rest of today and you should NOT DRIVE or use heavy machinery until tomorrow (because  of the sedation medicines used during the test).    FOLLOW UP: Our staff will call the number listed on your records 48-72 hours following your procedure to check on you and address any questions or concerns that you may have regarding the information given to you following your procedure. If we do not reach you, we will leave a message.  We will attempt to reach you two times.  During this call, we will ask if you have developed any symptoms of COVID 19. If you develop any symptoms (ie: fever, flu-like symptoms, shortness of breath, cough etc.) before then, please call [phone].  If you test positive for Covid 19 in the 2 weeks post procedure, please call and report this information to us.    If any biopsies were taken you will be contacted by phone or by letter within the next 1-3 weeks.  Please call us at [phone] if you have not heard about the biopsies in 3 weeks.    SIGNATURES/CONFIDENTIALITY: You and/or your care partner have signed paperwork which will be entered into your electronic medical record.  These signatures attest to the fact that that the information above on your After Visit Summary has been reviewed and is understood.  Full responsibility of the confidentiality of this discharge information lies with you and/or your care-partner. 

## 2019-11-10 NOTE — Op Note (Signed)
[facility] Patient Name: Kendra Gonzalez Procedure Date: 11/10/2019 7:55 AM MRN: [id number] Endoscopist: Thornton Park MD, MD Age: 49 Referring MD:  Date of Birth: 05/02/1970 Gender: Female Account #: [id number] Procedure:                Colonoscopy Indications:              Screening for colorectal malignant neoplasm, This                            is the patient's first colonoscopy                           No known family history of colon cancer or polyps Medicines:                Monitored Anesthesia Care Procedure:                Pre-Anesthesia Assessment:                           - Prior to the procedure, a History and Physical                            was performed, and patient medications and                            allergies were reviewed. The patient's tolerance of                            previous anesthesia was also reviewed. The risks                            and benefits of the procedure and the sedation                            options and risks were discussed with the patient.                            All questions were answered, and informed consent                            was obtained. Prior Anticoagulants: The patient has                            taken no previous anticoagulant or antiplatelet                            agents. ASA Grade Assessment: II - A patient with                            mild systemic disease. After reviewing the risks                            and benefits, the patient was deemed in  satisfactory condition to undergo the procedure.                           After obtaining informed consent, the colonoscope                            was passed under direct vision. Throughout the                            procedure, the patient's blood pressure, pulse, and                            oxygen saturations were monitored continuously. The                            Colonoscope was  introduced through the anus and                            advanced to the 3 cm into the ileum. A second                            forward view of the right colon was performed. The                            colonoscopy was performed without difficulty. The                            quality of the bowel preparation was good. The                            patient tolerated the procedure well. The ileocecal                            valve, appendiceal orifice, and rectum were                            photographed. Scope In: 8:06:45 AM Scope Out: 8:20:41 AM Scope Withdrawal Time: 0 hours 10 minutes 2 seconds  Total Procedure Duration: 0 hours 13 minutes 56 seconds  Findings:                 The perianal and digital rectal examinations were                            normal.                           The entire examined colon appeared normal on direct                            and retroflexion views. Complications:            No immediate complications. Estimated Blood Loss:     Estimated blood loss: none. Impression:               - The entire examined colon  is normal on direct and                            retroflexion views.                           - No specimens collected. Recommendation:           - Patient has a contact number available for                            emergencies. The signs and symptoms of potential                            delayed complications were discussed with the                            patient. Return to normal activities tomorrow.                            Written discharge instructions were provided to the                            patient.                           - Resume previous diet.                           - Continue present medications.                           - Repeat colonoscopy in 10 years for surveillance.                           - Emerging evidence supports eating a diet of                            fruits, vegetables,  grains, calcium, and yogurt                            while reducing red meat and alcohol may reduce the                            risk of colon cancer.                           - Thank you for allowing me to be involved in your                            colon cancer prevention. Thornton Park MD, MD 11/10/2019 8:24:12 AM This report has been signed electronically.

## 2019-11-10 NOTE — Progress Notes (Signed)
pt tolerated well. VSS. awake and to recovery. Report given to RN.  

## 2019-11-14 ENCOUNTER — Telehealth: Payer: Self-pay | Admitting: *Deleted

## 2019-11-14 NOTE — Telephone Encounter (Signed)
Attempted f/u phone call. No answer. Left message. °

## 2019-11-14 NOTE — Telephone Encounter (Signed)
1. Have you developed a fever since your procedure? no  2.   Have you had an respiratory symptoms (SOB or cough) since your procedure? no  3.   Have you tested positive for COVID 19 since your procedure no  4.   Have you had any family members/close contacts diagnosed with the COVID 19 since your procedure?  no   If yes to any of these questions please route to Joylene John, RN and Erenest Rasher, RN Follow up Call-  Call back number 11/10/2019  Post procedure Call Back phone  # [phone]  Permission to leave phone message Yes  Some recent data might be hidden     Patient questions:  Do you have a fever, pain , or abdominal swelling? No. Pain Score  0 *  Have you tolerated food without any problems? Yes.    Have you been able to return to your normal activities? Yes.    Do you have any questions about your discharge instructions: Diet   No. Medications  No. Follow up visit  No.  Do you have questions or concerns about your Care? No.  Actions: * If pain score is 4 or above: No action needed, pain <4.pt says she hasn't had a bm but has taken Miralax and will call if that does not help

## 2019-11-17 ENCOUNTER — Ambulatory Visit
Admission: RE | Admit: 2019-11-17 | Discharge: 2019-11-17 | Disposition: A | Discharge status: Home / Self Care | Payer: 59 | Source: Ambulatory Visit | Attending: Orthopaedic Surgery | Admitting: Orthopaedic Surgery

## 2019-11-17 ENCOUNTER — Other Ambulatory Visit: Payer: Self-pay | Admitting: Orthopaedic Surgery

## 2019-11-17 ENCOUNTER — Other Ambulatory Visit: Payer: Self-pay

## 2019-11-17 DIAGNOSIS — Z471 Aftercare following joint replacement surgery: Secondary | ICD-10-CM

## 2019-11-17 DIAGNOSIS — Z96642 Presence of left artificial hip joint: Secondary | ICD-10-CM

## 2019-12-29 ENCOUNTER — Other Ambulatory Visit: Payer: Self-pay

## 2019-12-29 ENCOUNTER — Ambulatory Visit
Admission: EM | Admit: 2019-12-29 | Discharge: 2019-12-29 | Disposition: A | Discharge status: Home / Self Care | Payer: 59 | Attending: Physician Assistant | Admitting: Physician Assistant

## 2019-12-29 ENCOUNTER — Telehealth: Payer: Self-pay

## 2019-12-29 DIAGNOSIS — R110 Nausea: Secondary | ICD-10-CM

## 2019-12-29 DIAGNOSIS — R1013 Epigastric pain: Secondary | ICD-10-CM | POA: Diagnosis not present

## 2019-12-29 LAB — COMPREHENSIVE METABOLIC PANEL
ALT: 10 IU/L (ref 0–32)
AST: 17 IU/L (ref 0–40)
Albumin/Globulin Ratio: 1.8 (ref 1.2–2.2)
Albumin: 4.9 g/dL — ABNORMAL HIGH (ref 3.8–4.8)
Alkaline Phosphatase: 108 IU/L (ref 48–121)
BUN/Creatinine Ratio: 12 (ref 9–23)
BUN: 10 mg/dL (ref 6–24)
Bilirubin Total: 0.5 mg/dL (ref 0.0–1.2)
CO2: 30 mmol/L — ABNORMAL HIGH (ref 20–29)
Calcium: 11.2 mg/dL — ABNORMAL HIGH (ref 8.7–10.2)
Chloride: 104 mmol/L (ref 96–106)
Creatinine, Ser: 0.85 mg/dL (ref 0.57–1.00)
GFR calc Af Amer: 92 mL/min/{1.73_m2} (ref >59)
GFR calc non Af Amer: 80 mL/min/{1.73_m2} (ref >59)
Globulin, Total: 2.7 g/dL (ref 1.5–4.5)
Glucose: 115 mg/dL — ABNORMAL HIGH (ref 65–99)
Potassium: 4.5 mmol/L (ref 3.5–5.2)
Sodium: 142 mmol/L (ref 134–144)
Total Protein: 7.6 g/dL (ref 6.0–8.5)

## 2019-12-29 LAB — POCT URINALYSIS DIP (MANUAL ENTRY)
Bilirubin, UA: NEGATIVE
Glucose, UA: NEGATIVE mg/dL
Ketones, POC UA: NEGATIVE mg/dL
Leukocytes, UA: NEGATIVE
Nitrite, UA: NEGATIVE
Protein Ur, POC: NEGATIVE mg/dL
Spec Grav, UA: 1.025 (ref 1.010–1.025)
Urobilinogen, UA: 0.2 E.U./dL
pH, UA: 8.5 — AB (ref 5.0–8.0)

## 2019-12-29 LAB — CBC WITH DIFFERENTIAL/PLATELET
Basophils Absolute: 0 10*3/uL (ref 0.0–0.2)
Basos: 1 %
EOS (ABSOLUTE): 0.1 10*3/uL (ref 0.0–0.4)
Eos: 1 %
Hematocrit: 42.6 % (ref 34.0–46.6)
Hemoglobin: 14.6 g/dL (ref 11.1–15.9)
Lymphocytes Absolute: 1.8 10*3/uL (ref 0.7–3.1)
Lymphs: 23 %
MCH: 28.8 pg (ref 26.6–33.0)
MCHC: 34.3 g/dL (ref 31.5–35.7)
MCV: 84 fL (ref 79–97)
Monocytes Absolute: 0.3 10*3/uL (ref 0.1–0.9)
Monocytes: 4 %
Neutrophils Absolute: 5.5 10*3/uL (ref 1.4–7.0)
Neutrophils: 71 %
Platelets: 313 10*3/uL (ref 150–450)
RBC: 5.07 x10E6/uL (ref 3.77–5.28)
RDW: 13.8 % (ref 11.7–15.4)
WBC: 7.7 10*3/uL (ref 3.4–10.8)

## 2019-12-29 LAB — LIPASE: Lipase: 18 U/L (ref 14–72)

## 2019-12-29 MED ORDER — ALUM & MAG HYDROXIDE-SIMETH 200-200-20 MG/5ML PO SUSP
30.0000 mL | Freq: Once | ORAL | Status: AC
  Administered 2019-12-29: 30 mL via ORAL

## 2019-12-29 MED ORDER — ONDANSETRON 4 MG PO TBDP
4.0000 mg | ORAL_TABLET | Freq: Three times a day (TID) | ORAL | 0 refills | Status: DC | PRN
Start: 2019-12-29 — End: 2020-03-28

## 2019-12-29 MED ORDER — ONDANSETRON 4 MG PO TBDP
4.0000 mg | ORAL_TABLET | Freq: Once | ORAL | Status: AC
  Administered 2019-12-29: 4 mg via ORAL

## 2019-12-29 MED ORDER — LIDOCAINE VISCOUS HCL 2 % MT SOLN
15.0000 mL | Freq: Once | OROMUCOSAL | Status: AC
  Administered 2019-12-29: 15 mL via ORAL

## 2019-12-29 MED ORDER — OMEPRAZOLE 40 MG PO CPDR: 40.0000 mg | DELAYED_RELEASE_CAPSULE | Freq: Every day | ORAL | 0 refills | Status: DC

## 2019-12-29 MED ORDER — SUCRALFATE 1 G PO TABS: 1.0000 g | ORAL_TABLET | Freq: Three times a day (TID) | ORAL | 0 refills | Status: DC

## 2019-12-29 NOTE — Telephone Encounter (Signed)
Called pt and notified her that her labs, specifically lipase and pancreatic profile were WNL.

## 2019-12-29 NOTE — Discharge Instructions (Signed)
Labs drawn to check for liver enzymes, pancreas enzyme, infection. Zofran as needed for nausea/vomiting. Omeprazole and carafate for possible ulcer. Keep hydrated, urine should be clear to pale yellow in color. Bland diet, advance as tolerated. Monitor for any worsening of symptoms, nausea or vomiting not controlled by medication, worsening abdominal pain, fever, go to the emergency department for further evaluation needed.

## 2019-12-29 NOTE — ED Triage Notes (Signed)
Pt c/o mid-epigastric pain with nausea acute onset at 0100 this morning. Pt states dull then sharp cramping pain awakened her. States she has h/o ulcers and pancreatitis.  Denies v/d, dysuria symptoms, fever, chills, back or flank pain or CP Took tylenol at approx 0500.

## 2019-12-29 NOTE — ED Provider Notes (Signed)
[name]    CSN: [id number] Arrival date & time: 12/29/19  0816      History   Chief Complaint Chief Complaint  Patient presents with  . Abdominal Pain    HPI Kendra Gonzalez is a 49 y.o. female.   49 year old female with history of PUD, pancreatitis, GERD comes in for acute onset of mid epigastric pain and nausea 8 hours ago. Epigastric pain worse with oral intake. Nausea without vomiting. Denies diarrhea. Denies fever, chills, bdoy aches. Denies chest pain, shob. Denies flank pain, urinary changes. Denies URI symptoms. Denies medications changes. No recent EtOH use. Tea 1-1.5 cups/day. H/o pancreatitis 2013, s/p ERCP. S/p cholecystectomy.      Past Medical History:  Diagnosis Date  . Allergy   . Arthritis   . Asthma   . Endometriosis   . GERD (gastroesophageal reflux disease)   . History of tetanus, diphtheria, and acellular pertussis booster vaccination (Tdap)    07/30/2010  . Hyperlipidemia   . Kidney stones   . Pancreatitis     Patient Active Problem List   Diagnosis Date Noted  . Presence of left artificial hip joint 07/09/2017  . Hyperthyroidism 11/03/2016  . Multinodular goiter 04/17/2015  . History of stomach ulcers 03/22/2015  . Chronic pain of left heel 04/23/2014  . Chronic pain syndrome 03/19/2014  . Asthma 01/04/2013  . BMI 29.0-29.9,adult 10/12/2011  . 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 Hyperlipidemia 02/16/2007  . GERD 02/16/2007    Past Surgical History:  Procedure Laterality Date  . ABDOMINAL HYSTERECTOMY    . arthroscopic knee surgery     . carpel tunnel    . CHOLECYSTECTOMY    . ERCP  01/08/2012   Procedure: ENDOSCOPIC RETROGRADE CHOLANGIOPANCREATOGRAPHY (ERCP);  Surgeon: Beryle Beams, MD;  Location: [facility] ENDOSCOPY;  Service: Endoscopy;  Laterality: N/A;  . HIP SURGERY    . oophrectomy  2011   secondary to cysts  . TUBAL LIGATION    . UPPER GASTROINTESTINAL ENDOSCOPY  2016    OB History   No obstetric  history on file.      Home Medications    Prior to Admission medications   Medication Sig Start Date End Date Taking? Authorizing Provider  acetaminophen (TYLENOL) 500 MG tablet Take 500 mg by mouth every 6 (six) hours as needed.   Yes [provider]  magnesium gluconate (MAGONATE) 500 MG tablet Take 500 mg by mouth at bedtime.   Yes [provider]  SUMAtriptan (IMITREX) 50 MG tablet Take 1 tablet (50 mg total) by mouth every 2 (two) hours as needed for migraine. May repeat in 2 hours if headache persists or recurs. Not to exceed 200 mg in 24 h 05/27/19  Yes Avegno, Darrelyn Hillock, FNP  albuterol (PROVENTIL) (2.5 MG/3ML) 0.083% nebulizer solution Take 3 mLs (2.5 mg total) by nebulization every 6 (six) hours as needed for wheezing or shortness of breath. Patient not taking: Reported on 11/10/2019 09/19/19   Isaac Bliss, Rayford Halsted, MD  albuterol (VENTOLIN HFA) 108 (90 Base) MCG/ACT inhaler Inhale 2 puffs into the lungs every 6 (six) hours as needed for wheezing or shortness of breath. 09/19/19   Isaac Bliss, Rayford Halsted, MD  Cyanocobalamin 5000 MCG/ML LIQD Place under the tongue.    [provider]  diclofenac sodium (VOLTAREN) 1 % GEL Apply up to three times a day as needed to large joint. 07/18/18   Garald Balding, MD  fluticasone-salmeterol (ADVAIR HFA) [phone] MCG/ACT inhaler  Inhale 2 puffs into the lungs 2 (two) times daily. 09/19/19   Isaac Bliss, Rayford Halsted, MD  Multiple Vitamins-Minerals (MULTIVITAMIN WITH MINERALS) tablet Take 1 tablet by mouth daily.    [provider]  omeprazole (PRILOSEC) 40 MG capsule Take 1 capsule (40 mg total) by mouth daily. 12/29/19   Tasia Catchings, Yanis Juma V, PA-C  ondansetron (ZOFRAN ODT) 4 MG disintegrating tablet Take 1 tablet (4 mg total) by mouth every 8 (eight) hours as needed for nausea or vomiting. 12/29/19   Ok Edwards, PA-C  Sodium Sulfate-Mag Sulfate-KCl (SUTAB) [phone] MG TABS Take 1 kit by mouth as directed. 10/27/19    Thornton Park, MD  sucralfate (CARAFATE) 1 g tablet Take 1 tablet (1 g total) by mouth 4 (four) times daily -  with meals and at bedtime for 7 days. 12/29/19 01/05/20  Tasia Catchings, Nioka Thorington V, PA-C  pantoprazole (PROTONIX) 40 MG tablet Take 1 tablet (40 mg total) by mouth daily. 09/16/17 11/03/18  Mancel Bale, PA-C    Family History Family History  Problem Relation Age of Onset  . Heart disease Daughter   . Hypertension Father   . Hypertension Sister   . Hypertension Brother   . Hypertension Maternal Grandfather   . Heart disease Maternal Grandfather   . Hypertension Paternal Grandfather   . Hypertension Brother   . Hypertension Sister   . Thyroid disease Neg Hx   . Colon polyps Neg Hx   . Colon cancer Neg Hx   . Esophageal cancer Neg Hx   . Rectal cancer Neg Hx   . Stomach cancer Neg Hx     Social History Social History   Tobacco Use  . Smoking status: Never Smoker  . Smokeless tobacco: Never Used  Vaping Use  . Vaping Use: Never used  Substance Use Topics  . Alcohol use: No  . Drug use: No     Allergies   Hydromorphone   Review of Systems Review of Systems  Reason unable to perform ROS: See HPI as above.     Physical Exam Triage Vital Signs ED Triage Vitals  Enc Vitals Group     BP 12/29/19 0846 (!) 149/94     Pulse Rate 12/29/19 0846 62     Resp 12/29/19 0846 16     Temp 12/29/19 0846 98.1 F (36.7 C)     Temp Source 12/29/19 0846 Oral     SpO2 12/29/19 0846 95 %     Weight --      Height --      Head Circumference --      Peak Flow --      Pain Score 12/29/19 0851 10     Pain Loc --      Pain Edu? --      Excl. in [place]? --    No data found.  Updated Vital Signs BP (!) 149/94 (BP Location: Left Arm)   Pulse 62   Temp 98.1 F (36.7 C) (Oral)   Resp 16   LMP 06/06/1996 Comment: GYN  SpO2 95%   Physical Exam Constitutional:      General: She is not in acute distress.    Appearance: She is well-developed. She is not ill-appearing, toxic-appearing or  diaphoretic.  HENT:     Head: Normocephalic and atraumatic.  Eyes:     Conjunctiva/sclera: Conjunctivae normal.     Pupils: Pupils are equal, round, and reactive to light.  Cardiovascular:     Rate and Rhythm: Normal rate and  regular rhythm.  Pulmonary:     Effort: Pulmonary effort is normal. No respiratory distress.     Comments: LCTAB Abdominal:     General: Bowel sounds are normal.     Palpations: Abdomen is soft.     Tenderness: There is abdominal tenderness in the epigastric area. There is no right CVA tenderness, left CVA tenderness, guarding or rebound. Negative signs include Murphy's sign.  Musculoskeletal:     Cervical back: Normal range of motion and neck supple.  Skin:    General: Skin is warm and dry.  Neurological:     Mental Status: She is alert and oriented to person, place, and time.  Psychiatric:        Behavior: Behavior normal.        Judgment: Judgment normal.      UC Treatments / Results  Labs (all labs ordered are listed, but only abnormal results are displayed) Labs Reviewed  COMPREHENSIVE METABOLIC PANEL - Abnormal; Notable for the following components:      Result Value   Glucose 115 (*)    CO2 30 (*)    Calcium 11.2 (*)    Albumin 4.9 (*)    All other components within normal limits   Narrative:    Performed at:  [facility], [place], [place]  [id number] Lab Director: Lindon Romp MD, Phone:  [phone]  POCT URINALYSIS DIP (MANUAL ENTRY) - Abnormal; Notable for the following components:   Clarity, UA cloudy (*)    Blood, UA trace-intact (*)    pH, UA 8.5 (*)    All other components within normal limits  CBC WITH DIFFERENTIAL/PLATELET   Narrative:    Performed at:  [facility], [place], [place]  [id number] Lab Director: Lindon Romp MD, Phone:  [phone]  LIPASE   Narrative:    Performed at:  [facility] [place], [place], [place]  [id number] Lab Director: Lindon Romp MD, Phone:  [phone]    EKG   Radiology No results found.  Procedures Procedures (including critical care time)  Medications Ordered in UC Medications  ondansetron (ZOFRAN-ODT) disintegrating tablet 4 mg (4 mg Oral Given 12/29/19 0856)  alum & mag hydroxide-simeth (MAALOX/MYLANTA) 200-200-20 MG/5ML suspension 30 mL (30 mLs Oral Given 12/29/19 0936)    And  lidocaine (XYLOCAINE) 2 % viscous mouth solution 15 mL (15 mLs Oral Given 12/29/19 0936)    Initial Impression / Assessment and Plan / UC Course  I have reviewed the triage vital signs and the nursing notes.  Pertinent labs & imaging results that were available during my care of the patient were reviewed by me and considered in my medical decision making (see chart for details).    Patient with mild relief of nausea after Zofran.  Afebrile, nontoxic. Abdomen soft, +BS, TTP of epigastric region without guarding or rebound. LCTAB. ? PUD vs gastritis vs pancreatitis vs hepatic causes. Will draw CBC, CMP, lipase for further evaluation. For now, will have patient continue zofran as needed. PPI and carafate if labs normal. Return precautions given. Patient discharged in stable condition pending lab results.   CBC, CMP, lipase without alarming values. Will start PPI and carafate as per plan and follow up with PCP if symptoms not improving. RN informed patient of result, please see RN notes.  Final Clinical Impressions(s) / UC Diagnoses   Final diagnoses:  Abdominal pain, epigastric  Nausea without vomiting    ED Prescriptions    Medication Sig Dispense Auth. Provider   ondansetron (ZOFRAN ODT) 4 MG disintegrating tablet Take 1 tablet (4 mg total) by mouth every 8 (eight) hours as needed for nausea or vomiting. 15 tablet Louis Ivery V, PA-C   omeprazole (PRILOSEC) 40 MG capsule Take 1 capsule (40 mg total) by mouth daily. 7 capsule Hensley Treat V, PA-C   sucralfate (CARAFATE) 1 g  tablet Take 1 tablet (1 g total) by mouth 4 (four) times daily -  with meals and at bedtime for 7 days. 28 tablet Ok Edwards, PA-C     PDMP not reviewed this encounter.   Ok Edwards, PA-C 12/29/19 1624

## 2020-01-05 ENCOUNTER — Ambulatory Visit: Payer: 59 | Admitting: Internal Medicine

## 2020-01-29 ENCOUNTER — Ambulatory Visit: Payer: 59

## 2020-02-01 ENCOUNTER — Ambulatory Visit: Payer: 59 | Attending: Internal Medicine

## 2020-02-01 DIAGNOSIS — Z23 Encounter for immunization: Secondary | ICD-10-CM

## 2020-02-01 NOTE — Progress Notes (Signed)
   Covid-19 Vaccination Clinic  Name:  Kendra Gonzalez    MRN: [id number] DOB: 17-Apr-1971  02/01/2020  [name] was observed post Covid-19 immunization for 15 minutes without incident. She was provided with Vaccine Information Sheet and instruction to access the V-Safe system.   [name] was instructed to call 911 with any severe reactions post vaccine: Marland Kitchen Difficulty breathing  . Swelling of face and throat  . A fast heartbeat  . A bad rash all over body  . Dizziness and weakness

## 2020-02-09 ENCOUNTER — Telehealth: Payer: Self-pay | Admitting: Family Medicine

## 2020-02-09 NOTE — Telephone Encounter (Signed)
Recv'd records from [facility] forwarded 2 pages to Dr. Janett Billow Copland 10/15/21fbg

## 2020-02-20 ENCOUNTER — Telehealth: Payer: Self-pay | Admitting: Internal Medicine

## 2020-02-20 DIAGNOSIS — G44009 Cluster headache syndrome, unspecified, not intractable: Secondary | ICD-10-CM

## 2020-02-20 NOTE — Telephone Encounter (Signed)
Yes

## 2020-02-20 NOTE — Telephone Encounter (Signed)
Okay to refer? 

## 2020-02-20 NOTE — Telephone Encounter (Signed)
Referral placed.

## 2020-02-20 NOTE — Telephone Encounter (Signed)
Pt is calling in stating that she would like to have a referral to [facility] for her cluster migraines.  Pt stated that it is in her chart that she has real bad migraines.  Pt would like to have a call back.

## 2020-03-05 ENCOUNTER — Encounter: Payer: Self-pay | Admitting: Neurology

## 2020-03-06 ENCOUNTER — Other Ambulatory Visit: Payer: Self-pay | Admitting: Family Medicine

## 2020-03-15 ENCOUNTER — Ambulatory Visit: Payer: Self-pay | Admitting: Internal Medicine

## 2020-03-20 ENCOUNTER — Ambulatory Visit: Payer: 59 | Admitting: Internal Medicine

## 2020-03-28 ENCOUNTER — Ambulatory Visit: Payer: 59 | Admitting: Internal Medicine

## 2020-03-28 ENCOUNTER — Other Ambulatory Visit: Payer: Self-pay

## 2020-03-28 VITALS — BP 122/80 | HR 100 | Temp 98.6°F | Ht 64.0 in | Wt 199.6 lb

## 2020-03-28 DIAGNOSIS — K219 Gastro-esophageal reflux disease without esophagitis: Secondary | ICD-10-CM | POA: Diagnosis not present

## 2020-03-28 DIAGNOSIS — Z23 Encounter for immunization: Secondary | ICD-10-CM | POA: Diagnosis not present

## 2020-03-28 DIAGNOSIS — E0590 Thyrotoxicosis, unspecified without thyrotoxic crisis or storm: Secondary | ICD-10-CM | POA: Diagnosis not present

## 2020-03-28 DIAGNOSIS — E785 Hyperlipidemia, unspecified: Secondary | ICD-10-CM | POA: Diagnosis not present

## 2020-03-28 DIAGNOSIS — J4530 Mild persistent asthma, uncomplicated: Secondary | ICD-10-CM | POA: Diagnosis not present

## 2020-03-28 DIAGNOSIS — G43919 Migraine, unspecified, intractable, without status migrainosus: Secondary | ICD-10-CM

## 2020-03-28 MED ORDER — OMEPRAZOLE 40 MG PO CPDR: 40.0000 mg | DELAYED_RELEASE_CAPSULE | Freq: Every day | ORAL | 1 refills | Status: DC

## 2020-03-28 MED ORDER — RIZATRIPTAN BENZOATE 5 MG PO TABS: 5.0000 mg | ORAL_TABLET | ORAL | 0 refills | Status: DC | PRN

## 2020-03-28 NOTE — Patient Instructions (Signed)
-  Nice seeing you today!!  -Lab work today; will notify you once results are available.  -Second and final shingles vaccine today.  -Stop imitrex. Start rizatriptan 5 mg at the onset of a headache.  -Schedule follow up in 6 months.

## 2020-03-28 NOTE — Addendum Note (Signed)
Addended by: Westley Hummer B on: 03/28/2020 04:43 PM   Modules accepted: Orders

## 2020-03-28 NOTE — Addendum Note (Signed)
Addended by: Marrion Coy on: 03/28/2020 11:36 AM   Modules accepted: Orders

## 2020-03-28 NOTE — Progress Notes (Signed)
Established Patient Office Visit     This visit occurred during the SARS-CoV-2 public health emergency.  Safety protocols were in place, including screening questions prior to the visit, additional usage of staff PPE, and extensive cleaning of exam room while observing appropriate contact time as indicated for disinfecting solutions.    CC/Reason for Visit: 24-monthfollow-up chronic medical conditions  HPI: Kendra Gonzalez a 49y.o. female who is coming in today for the above mentioned reasons. Past Medical History is significant for: Obesity, migraine headaches, asthma well-controlled on Advair and as needed albuterol, history of hyperlipidemia and hypothyroidism but not on any medications for these last 2.  Some acute questions today:  1.  She is requesting her second shingles vaccine.  2.  She would like to change her Imitrex for another medication as Imitrex frequently causes rapid heartbeat.  She is also requesting referral to a headache specialist.  3.  She is asking that I fill out a handicap form for her.  She has had a hip replacement and is having trouble getting in and out of cars and walking long distances.  She is otherwise feeling well.  She has had her Covid booster and her flu vaccine.   Past Medical/Surgical History: Past Medical History:  Diagnosis Date  . Allergy   . Arthritis   . Asthma   . Endometriosis   . GERD (gastroesophageal reflux disease)   . History of tetanus, diphtheria, and acellular pertussis booster vaccination (Tdap)    07/30/2010  . Hyperlipidemia   . Kidney stones   . Pancreatitis     Past Surgical History:  Procedure Laterality Date  . ABDOMINAL HYSTERECTOMY    . arthroscopic knee surgery     . carpel tunnel    . CHOLECYSTECTOMY    . ERCP  01/08/2012   Procedure: ENDOSCOPIC RETROGRADE CHOLANGIOPANCREATOGRAPHY (ERCP);  Surgeon: PBeryle Beams MD;  [facility];  Service: Endoscopy;  Laterality: N/A;  . HIP  SURGERY    . oophrectomy  2011   secondary to cysts  . TUBAL LIGATION    . UPPER GASTROINTESTINAL ENDOSCOPY  2016    Social History:  reports that she has never smoked. She has never used smokeless tobacco. She reports that she does not drink alcohol and does not use drugs.  Allergies: Allergies  Allergen Reactions  . Hydromorphone Itching    Family History:  Family History  Problem Relation Age of Onset  . Heart disease Daughter   . Hypertension Father   . Hypertension Sister   . Hypertension Brother   . Hypertension Maternal Grandfather   . Heart disease Maternal Grandfather   . Hypertension Paternal Grandfather   . Hypertension Brother   . Hypertension Sister   . Thyroid disease Neg Hx   . Colon polyps Neg Hx   . Colon cancer Neg Hx   . Esophageal cancer Neg Hx   . Rectal cancer Neg Hx   . Stomach cancer Neg Hx      Current Outpatient Medications:  .  acetaminophen (TYLENOL) 500 MG tablet, Take 500 mg by mouth every 6 (six) hours as needed., Disp: , Rfl:  .  albuterol (PROVENTIL) (2.5 MG/3ML) 0.083% nebulizer solution, Take 3 mLs (2.5 mg total) by nebulization every 6 (six) hours as needed for wheezing or shortness of breath., Disp: 150 mL, Rfl: 1 .  albuterol (VENTOLIN HFA) 108 (90 Base) MCG/ACT inhaler, Inhale 2 puffs into the lungs every 6 (six) hours  as needed for wheezing or shortness of breath., Disp: 18 g, Rfl: 2 .  Cyanocobalamin 5000 MCG/ML LIQD, Place under the tongue., Disp: , Rfl:  .  diclofenac sodium (VOLTAREN) 1 % GEL, Apply up to three times a day as needed to large joint., Disp: 3 Tube, Rfl: 4 .  fluticasone-salmeterol (ADVAIR HFA) 115-21 MCG/ACT inhaler, Inhale 2 puffs into the lungs 2 (two) times daily., Disp: 1 Inhaler, Rfl: 2 .  magnesium gluconate (MAGONATE) 500 MG tablet, Take 500 mg by mouth at bedtime., Disp: , Rfl:  .  Multiple Vitamins-Minerals (MULTIVITAMIN WITH MINERALS) tablet, Take 1 tablet by mouth daily., Disp: , Rfl:  .  omeprazole  (PRILOSEC) 40 MG capsule, Take 1 capsule (40 mg total) by mouth daily., Disp: 90 capsule, Rfl: 1 .  Sodium Sulfate-Mag Sulfate-KCl (SUTAB) [phone] MG TABS, Take 1 kit by mouth as directed., Disp: 24 tablet, Rfl: 0 .  sucralfate (CARAFATE) 1 g tablet, Take 1 tablet (1 g total) by mouth 4 (four) times daily -  with meals and at bedtime for 7 days., Disp: 28 tablet, Rfl: 0 .  rizatriptan (MAXALT) 5 MG tablet, Take 1 tablet (5 mg total) by mouth as needed for migraine. May repeat in 2 hours if needed, Disp: 10 tablet, Rfl: 0  Review of Systems:  Constitutional: Denies fever, chills, diaphoresis, appetite change and fatigue.  HEENT: Denies photophobia, eye pain, redness, hearing loss, ear pain, congestion, sore throat, rhinorrhea, sneezing, mouth sores, trouble swallowing, neck pain, neck stiffness and tinnitus.   Respiratory: Denies SOB, DOE, cough, chest tightness,  and wheezing.   Cardiovascular: Denies chest pain, palpitations and leg swelling.  Gastrointestinal: Denies nausea, vomiting, abdominal pain, diarrhea, constipation, blood in stool and abdominal distention.  Genitourinary: Denies dysuria, urgency, frequency, hematuria, flank pain and difficulty urinating.  Endocrine: Denies: hot or cold intolerance, sweats, changes in hair or nails, polyuria, polydipsia. Musculoskeletal: Denies myalgias, back pain, joint swelling. Skin: Denies pallor, rash and wound.  Neurological: Denies dizziness, seizures, syncope, weakness, light-headedness, numbness and headaches.  Hematological: Denies adenopathy. Easy bruising, personal or family bleeding history  Psychiatric/Behavioral: Denies suicidal ideation, mood changes, confusion, nervousness, sleep disturbance and agitation    Physical Exam: Vitals:   03/28/20 1100  BP: 122/80  Pulse: 100  Temp: 98.6 F (37 C)  TempSrc: Oral  SpO2: 98%  Weight: 199 lb 9.6 oz (90.5 kg)  Height: _0  (1.626 m)    Body mass index is 34.26  kg/m.   Constitutional: NAD, calm, comfortable Eyes: PERRL, lids and conjunctivae normal ENMT: Mucous membranes are moist. Respiratory: clear to auscultation bilaterally, no wheezing, no crackles. Normal respiratory effort. No accessory muscle use.  Cardiovascular: Regular rate and rhythm, no murmurs / rubs / gallops. No extremity edema. Neurologic: Grossly intact and nonfocal Psychiatric: Normal judgment and insight. Alert and oriented x 3. Normal mood.    Impression and Plan:  Hyperthyroidism  - Plan: TSH, T3, free, T4, free -Not currently on any medications.  Hyperlipidemia, unspecified hyperlipidemia type  - Plan: Lipid panel -Not currently on medications, last noted LDL was 135 in 2018.  Mild persistent asthma without complication  -Well-controlled on Advair and as needed albuterol which she only uses about twice a week.  Gastroesophageal reflux disease without esophagitis -She is requesting omeprazole refills today. -It is well controlled on PPI therapy.  Need for shingles vaccine -Shingles vaccine administered today.  Intractable migraine without status migrainosus, unspecified migraine type -Change sumatriptan to rizatriptan per patient request, referred to headache  clinic.   Patient Instructions  -Nice seeing you today!!  -Lab work today; will notify you once results are available.  -Second and final shingles vaccine today.  -Stop imitrex. Start rizatriptan 5 mg at the onset of a headache.  -Schedule follow up in 6 months.     Lelon Frohlich, MD [facility]

## 2020-03-29 ENCOUNTER — Encounter: Payer: Self-pay | Admitting: Internal Medicine

## 2020-03-29 ENCOUNTER — Ambulatory Visit
Admission: EM | Admit: 2020-03-29 | Discharge: 2020-03-29 | Disposition: A | Discharge status: Home / Self Care | Payer: 59 | Attending: Family Medicine | Admitting: Family Medicine

## 2020-03-29 DIAGNOSIS — E0590 Thyrotoxicosis, unspecified without thyrotoxic crisis or storm: Secondary | ICD-10-CM

## 2020-03-29 DIAGNOSIS — R509 Fever, unspecified: Secondary | ICD-10-CM

## 2020-03-29 DIAGNOSIS — R52 Pain, unspecified: Secondary | ICD-10-CM

## 2020-03-29 LAB — COMPREHENSIVE METABOLIC PANEL
AG Ratio: 1.5 (calc) (ref 1.0–2.5)
ALT: 13 U/L (ref 6–29)
AST: 19 U/L (ref 10–35)
Albumin: 4.4 g/dL (ref 3.6–5.1)
Alkaline phosphatase (APISO): 93 U/L (ref 37–153)
BUN: 14 mg/dL (ref 7–25)
CO2: 24 mmol/L (ref 20–32)
Calcium: 10.9 mg/dL — ABNORMAL HIGH (ref 8.6–10.4)
Chloride: 105 mmol/L (ref 98–110)
Creat: 0.81 mg/dL (ref 0.50–1.05)
Globulin: 2.9 g/dL (calc) (ref 1.9–3.7)
Glucose, Bld: 87 mg/dL (ref 65–99)
Potassium: 4.3 mmol/L (ref 3.5–5.3)
Sodium: 144 mmol/L (ref 135–146)
Total Bilirubin: 0.8 mg/dL (ref 0.2–1.2)
Total Protein: 7.3 g/dL (ref 6.1–8.1)

## 2020-03-29 LAB — CBC WITH DIFFERENTIAL/PLATELET
Absolute Monocytes: 508 cells/uL (ref 200–950)
Basophils Absolute: 69 cells/uL (ref 0–200)
Basophils Relative: 0.9 %
Eosinophils Absolute: 131 cells/uL (ref 15–500)
Eosinophils Relative: 1.7 %
HCT: 40.9 % (ref 35.0–45.0)
Hemoglobin: 13.5 g/dL (ref 11.7–15.5)
Lymphs Abs: 2803 cells/uL (ref 850–3900)
MCH: 28.2 pg (ref 27.0–33.0)
MCHC: 33 g/dL (ref 32.0–36.0)
MCV: 85.4 fL (ref 80.0–100.0)
MPV: 11.4 fL (ref 7.5–12.5)
Monocytes Relative: 6.6 %
Neutro Abs: 4189 cells/uL (ref 1500–7800)
Neutrophils Relative %: 54.4 %
Platelets: 312 10*3/uL (ref 140–400)
RBC: 4.79 10*6/uL (ref 3.80–5.10)
RDW: 13 % (ref 11.0–15.0)
Total Lymphocyte: 36.4 %
WBC: 7.7 10*3/uL (ref 3.8–10.8)

## 2020-03-29 LAB — VITAMIN B12: Vitamin B-12: 1547 pg/mL — ABNORMAL HIGH (ref 200–1100)

## 2020-03-29 LAB — LIPID PANEL
Cholesterol: 251 mg/dL — ABNORMAL HIGH (ref <200)
HDL: 73 mg/dL (ref >=50)
LDL Cholesterol (Calc): 164 mg/dL (calc) — ABNORMAL HIGH
Non-HDL Cholesterol (Calc): 178 mg/dL (calc) — ABNORMAL HIGH (ref <130)
Total CHOL/HDL Ratio: 3.4 (calc) (ref <5.0)
Triglycerides: 50 mg/dL (ref <150)

## 2020-03-29 LAB — TSH: TSH: <0.01 mIU/L — ABNORMAL LOW

## 2020-03-29 LAB — VITAMIN D 25 HYDROXY (VIT D DEFICIENCY, FRACTURES): Vit D, 25-Hydroxy: 30 ng/mL (ref 30–100)

## 2020-03-29 MED ORDER — PROMETHAZINE-DM 6.25-15 MG/5ML PO SYRP: 5.0000 mL | ORAL_SOLUTION | Freq: Four times a day (QID) | ORAL | 0 refills | Status: DC | PRN

## 2020-03-29 NOTE — ED Provider Notes (Addendum)
[name]    CSN: [id number] Arrival date & time: 03/29/20  0801      History   Chief Complaint Chief Complaint  Patient presents with  . Fever    HPI Kendra Gonzalez is a 49 y.o. female.   HPI  Patient presents today with concern of fever, body aches, and chills.  Patient reports that she received the shingles vaccine yesterday around 11 AM.  She reports that she awakened feeling outside of her normal state and measured her temperature and it was 100.7.  She took a dose of Tylenol.  Upon awakening today she complains of congestion and cough.  She is fully vaccinated against COVID-19 and influenza.  She denies any known sick contacts.  Denies any illness prior to receiving vaccine yesterday.  Past Medical History:  Diagnosis Date  . Allergy   . Arthritis   . Asthma   . Endometriosis   . GERD (gastroesophageal reflux disease)   . History of tetanus, diphtheria, and acellular pertussis booster vaccination (Tdap)    07/30/2010  . Hyperlipidemia   . Kidney stones   . Pancreatitis     Patient Active Problem List   Diagnosis Date Noted  . Presence of left artificial hip joint 07/09/2017  . Hyperthyroidism 11/03/2016  . Multinodular goiter 04/17/2015  . History of stomach ulcers 03/22/2015  . Chronic pain of left heel 04/23/2014  . Chronic pain syndrome 03/19/2014  . Asthma 01/04/2013  . BMI 29.0-29.9,adult 10/12/2011  . 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 Hyperlipidemia 02/16/2007  . GERD 02/16/2007    Past Surgical History:  Procedure Laterality Date  . ABDOMINAL HYSTERECTOMY    . arthroscopic knee surgery     . carpel tunnel    . CHOLECYSTECTOMY    . ERCP  01/08/2012   Procedure: ENDOSCOPIC RETROGRADE CHOLANGIOPANCREATOGRAPHY (ERCP);  Surgeon: Beryle Beams, MD;  Location: [facility] END[facility]COPY;  Service: Endoscopy;  Laterality: N/A;  . HIP SURGERY    . oophrectomy  2011   secondary to cysts  . TUBAL LIGATION    . UPPER GASTROINTESTINAL  ENDOSCOPY  2016    OB History   No obstetric history on file.      Home Medications    Prior to Admission medications   Medication Sig Start Date End Date Taking? Authorizing Provider  acetaminophen (TYLENOL) 500 MG tablet Take 500 mg by mouth every 6 (six) hours as needed.    [provider]  albuterol (PROVENTIL) (2.5 MG/3ML) 0.083% nebulizer solution Take 3 mLs (2.5 mg total) by nebulization every 6 (six) hours as needed for wheezing or shortness of breath. 09/19/19   Isaac Bliss, Rayford Halsted, MD  albuterol (VENTOLIN HFA) 108 (90 Base) MCG/ACT inhaler Inhale 2 puffs into the lungs every 6 (six) hours as needed for wheezing or shortness of breath. 09/19/19   Isaac Bliss, Rayford Halsted, MD  Cyanocobalamin 5000 MCG/ML LIQD Place under the tongue.    [provider]  diclofenac sodium (VOLTAREN) 1 % GEL Apply up to three times a day as needed to large joint. 07/18/18   Garald Balding, MD  fluticasone-salmeterol (ADVAIR HFA) [phone] MCG/ACT inhaler Inhale 2 puffs into the lungs 2 (two) times daily. 09/19/19   Isaac Bliss, Rayford Halsted, MD  magnesium gluconate (MAGONATE) 500 MG tablet Take 500 mg by mouth at bedtime.    [provider]  Multiple Vitamins-Minerals (MULTIVITAMIN WITH MINERALS) tablet Take 1 tablet by mouth daily.    [provider]  omeprazole (  PRILOSEC) 40 MG capsule Take 1 capsule (40 mg total) by mouth daily. 03/28/20   Isaac Bliss, Rayford Halsted, MD  rizatriptan (MAXALT) 5 MG tablet Take 1 tablet (5 mg total) by mouth as needed for migraine. May repeat in 2 hours if needed 03/28/20   Isaac Bliss, Rayford Halsted, MD  Sodium Sulfate-Mag Sulfate-KCl (SUTAB) [phone] MG TABS Take 1 kit by mouth as directed. 10/27/19   Thornton Park, MD  sucralfate (CARAFATE) 1 g tablet Take 1 tablet (1 g total) by mouth 4 (four) times daily -  with meals and at bedtime for 7 days. 12/29/19 03/28/20  Tasia Catchings, Amy V, PA-C  pantoprazole (PROTONIX) 40 MG tablet Take 1  tablet (40 mg total) by mouth daily. 09/16/17 11/03/18  Mancel Bale, PA-C    Family History Family History  Problem Relation Age of Onset  . Heart disease Daughter   . Hypertension Father   . Hypertension Sister   . Hypertension Brother   . Hypertension Maternal Grandfather   . Heart disease Maternal Grandfather   . Hypertension Paternal Grandfather   . Hypertension Brother   . Hypertension Sister   . Thyroid disease Neg Hx   . Colon polyps Neg Hx   . Colon cancer Neg Hx   . Esophageal cancer Neg Hx   . Rectal cancer Neg Hx   . Stomach cancer Neg Hx     Social History Social History   Tobacco Use  . Smoking status: Never Smoker  . Smokeless tobacco: Never Used  Vaping Use  . Vaping Use: Never used  Substance Use Topics  . Alcohol use: No  . Drug use: No     Allergies   Hydromorphone   Review of Systems Review of Systems Pertinent negatives listed in HPI Physical Exam Triage Vital Signs ED Triage Vitals  Enc Vitals Group     BP 03/29/20 0815 135/79     Pulse Rate 03/29/20 0815 99     Resp 03/29/20 0815 18     Temp 03/29/20 0815 99.3 F (37.4 C)     Temp Source 03/29/20 0815 Oral     SpO2 03/29/20 0815 98 %     Weight --      Height --      Head Circumference --      Peak Flow --      Pain Score 03/29/20 0816 6     Pain Loc --      Pain Edu? --      Excl. in [place]? --    No data found.  Updated Vital Signs BP 135/79 (BP Location: Left Arm)   Pulse 99   Temp 99.3 F (37.4 C) (Oral)   Resp 18   LMP 06/06/1996 Comment: GYN  SpO2 98%   Visual Acuity Right Eye Distance:   Left Eye Distance:   Bilateral Distance:    Right Eye Near:   Left Eye Near:    Bilateral Near:     Physical Exam General appearance: alert, ill-appearing, cooperative and in no distress Head: Normocephalic, without obvious abnormality, atraumatic Respiratory: Respirations even and unlabored, normal respiratory rate Heart: rate and rhythm normal. No gallop or murmurs  noted on exam  Extremities: No gross deformities Skin: Skin color, texture, turgor normal. No rashes seen  Psych: Appropriate mood and affect. Neurologic: Mental status: Alert, oriented to person, place, and time, thought content appropriate.  UC Treatments / Results  Labs (all labs ordered are listed, but only abnormal results are displayed)  Labs Reviewed - No data to display  EKG   Radiology No results found.  Procedures Procedures (including critical care time)  Medications Ordered in UC Medications - No data to display  Initial Impression / Assessment and Plan / UC Course  I have reviewed the triage vital signs and the nursing notes.  Pertinent labs & imaging results that were available during my care of the patient were reviewed by me and considered in my medical decision making (see chart for details).     Acute viral illness.  Advised do not feel that respiratory symptoms are related to shingles vaccine however is a known expectation that some vaccines can cause generalized body aches and fever.  Advised to continue to manage fever and body aches with ibuprofen and Tylenol.  Prescription Promethazine DM for as needed use for cough.  Given sudden onset of symptoms would like to rule out Covid or influenza therefore respiratory panel pending. Final Clinical Impressions(s) / UC Diagnoses   Final diagnoses:  Fever, unspecified  Generalized body aches     Discharge Instructions     Continue to manage fever and body aches with ibuprofen and Tylenol.  Your respiratory test will be available with in 48 to 72 hours.  In the meantime hydrate well with fluids, quarantine until results are known.    ED Prescriptions    Medication Sig Dispense Auth. Provider   promethazine-dextromethorphan (PROMETHAZINE-DM) 6.25-15 MG/5ML syrup Take 5 mLs by mouth 4 (four) times daily as needed for cough. 140 mL Scot Jun, FNP     PDMP not reviewed this encounter.   Scot Jun, FNP 03/29/20 0829    Scot Jun, FNP 03/29/20 0830

## 2020-03-29 NOTE — Discharge Instructions (Addendum)
Continue to manage fever and body aches with ibuprofen and Tylenol.  Your respiratory test will be available with in 48 to 72 hours.  In the meantime hydrate well with fluids, quarantine until results are known.

## 2020-03-29 NOTE — ED Triage Notes (Signed)
Pt states woke up around 3am with fever 100.7 and chills. States had her shingle shot yesterday around 11am. Last tylenol at 3am.

## 2020-03-30 LAB — COVID-19, FLU A+B AND RSV
Influenza A, NAA: NOT DETECTED
Influenza B, NAA: NOT DETECTED
RSV, NAA: NOT DETECTED
SARS-CoV-2, NAA: NOT DETECTED

## 2020-04-01 ENCOUNTER — Ambulatory Visit: Payer: 59 | Admitting: Neurology

## 2020-04-02 ENCOUNTER — Other Ambulatory Visit: Payer: Self-pay | Admitting: Internal Medicine

## 2020-04-02 DIAGNOSIS — E559 Vitamin D deficiency, unspecified: Secondary | ICD-10-CM

## 2020-04-02 DIAGNOSIS — E0590 Thyrotoxicosis, unspecified without thyrotoxic crisis or storm: Secondary | ICD-10-CM

## 2020-04-02 MED ORDER — VITAMIN D (ERGOCALCIFEROL) 1.25 MG (50000 UNIT) PO CAPS: 50000.0000 [IU] | ORAL_CAPSULE | ORAL | 0 refills | Status: DC

## 2020-04-03 ENCOUNTER — Other Ambulatory Visit: Payer: Self-pay | Admitting: Internal Medicine

## 2020-04-03 DIAGNOSIS — E559 Vitamin D deficiency, unspecified: Secondary | ICD-10-CM

## 2020-04-03 DIAGNOSIS — E0590 Thyrotoxicosis, unspecified without thyrotoxic crisis or storm: Secondary | ICD-10-CM

## 2020-04-03 DIAGNOSIS — Z1211 Encounter for screening for malignant neoplasm of colon: Secondary | ICD-10-CM

## 2020-04-03 DIAGNOSIS — D619 Aplastic anemia, unspecified: Secondary | ICD-10-CM

## 2020-04-04 ENCOUNTER — Other Ambulatory Visit (INDEPENDENT_AMBULATORY_CARE_PROVIDER_SITE_OTHER): Payer: 59

## 2020-04-04 ENCOUNTER — Other Ambulatory Visit: Payer: Self-pay

## 2020-04-04 DIAGNOSIS — E0590 Thyrotoxicosis, unspecified without thyrotoxic crisis or storm: Secondary | ICD-10-CM

## 2020-04-04 DIAGNOSIS — E559 Vitamin D deficiency, unspecified: Secondary | ICD-10-CM

## 2020-04-05 LAB — VITAMIN D 25 HYDROXY (VIT D DEFICIENCY, FRACTURES): Vit D, 25-Hydroxy: 32 ng/mL (ref 30–100)

## 2020-04-11 ENCOUNTER — Other Ambulatory Visit: Payer: Self-pay | Admitting: Internal Medicine

## 2020-04-11 DIAGNOSIS — E559 Vitamin D deficiency, unspecified: Secondary | ICD-10-CM

## 2020-04-11 MED ORDER — VITAMIN D (ERGOCALCIFEROL) 1.25 MG (50000 UNIT) PO CAPS: 50000.0000 [IU] | ORAL_CAPSULE | ORAL | 0 refills | Status: AC

## 2020-05-03 ENCOUNTER — Other Ambulatory Visit: Payer: Self-pay

## 2020-05-03 ENCOUNTER — Encounter: Payer: Self-pay | Admitting: Endocrinology

## 2020-05-03 ENCOUNTER — Ambulatory Visit: Payer: 59 | Admitting: Endocrinology

## 2020-05-03 VITALS — BP 124/86 | HR 64 | Ht 64.0 in | Wt 197.0 lb

## 2020-05-03 DIAGNOSIS — E042 Nontoxic multinodular goiter: Secondary | ICD-10-CM | POA: Diagnosis not present

## 2020-05-03 MED ORDER — METHIMAZOLE 10 MG PO TABS: 10.0000 mg | ORAL_TABLET | Freq: Every day | ORAL | 3 refills | Status: DC

## 2020-05-03 NOTE — Progress Notes (Signed)
Subjective:    Patient ID: Kendra Gonzalez, female    DOB: 06-13-1970, 50 y.o.   MRN: [id number]  HPI Pt returns for f/u of hyperthyroidism (due to multinodular goiter; dx'ed early 2016; she had bilat bxs (Cat. 2); she was rx'ed tapazole; she declines RAI).  She has not recently taken tapazole.  pt states she feels well in general, except for tremor Past Medical History:  Diagnosis Date  . Allergy   . Arthritis   . Asthma   . Endometriosis   . GERD (gastroesophageal reflux disease)   . History of tetanus, diphtheria, and acellular pertussis booster vaccination (Tdap)    07/30/2010  . Hyperlipidemia   . Kidney stones   . Pancreatitis     Past Surgical History:  Procedure Laterality Date  . ABDOMINAL HYSTERECTOMY    . arthroscopic knee surgery     . carpel tunnel    . CHOLECYSTECTOMY    . ERCP  01/08/2012   Procedure: ENDOSCOPIC RETROGRADE CHOLANGIOPANCREATOGRAPHY (ERCP);  Surgeon: Beryle Beams, MD;  Location: [facility] ENDOSCOPY;  Service: Endoscopy;  Laterality: N/A;  . HIP SURGERY    . oophrectomy  2011   secondary to cysts  . TUBAL LIGATION    . UPPER GASTROINTESTINAL ENDOSCOPY  2016    Social History   Socioeconomic History  . Marital status: Married    Spouse name: Fritz Pickerel  . Number of children: 2  . Years of education: Not on file  . Highest education level: Not on file  Occupational History  . Occupation: Nursing Aid    Employer: [employer]  Tobacco Use  . Smoking status: Never Smoker  . Smokeless tobacco: Never Used  Vaping Use  . Vaping Use: Never used  Substance and Sexual Activity  . Alcohol use: No  . Drug use: No  . Sexual activity: Yes    Birth control/protection: None  Other Topics Concern  . Not on file  Social History Narrative   Works as a Education officer, environmental.     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   Adult children live in [place], [place] and [place].   Social Determinants of Health   Financial Resource Strain: Not on file  Food Insecurity:  Not on file  Transportation Needs: Not on file  Physical Activity: Not on file  Stress: Not on file  Social Connections: Not on file  Intimate Partner Violence: Not on file    Current Outpatient Medications on File Prior to Visit  Medication Sig Dispense Refill  . acetaminophen (TYLENOL) 500 MG tablet Take 500 mg by mouth every 6 (six) hours as needed.    Marland Kitchen albuterol (PROVENTIL) (2.5 MG/3ML) 0.083% nebulizer solution Take 3 mLs (2.5 mg total) by nebulization every 6 (six) hours as needed for wheezing or shortness of breath. 150 mL 1  . albuterol (VENTOLIN HFA) 108 (90 Base) MCG/ACT inhaler Inhale 2 puffs into the lungs every 6 (six) hours as needed for wheezing or shortness of breath. 18 g 2  . Cyanocobalamin 5000 MCG/ML LIQD Place under the tongue.    . diclofenac sodium (VOLTAREN) 1 % GEL Apply up to three times a day as needed to large joint. 3 Tube 4  . fluticasone-salmeterol (ADVAIR HFA) 115-21 MCG/ACT inhaler Inhale 2 puffs into the lungs 2 (two) times daily. 1 Inhaler 2  . magnesium gluconate (MAGONATE) 500 MG tablet Take 500 mg by mouth at bedtime.    . Multiple Vitamins-Minerals (MULTIVITAMIN WITH MINERALS) tablet Take 1 tablet by mouth  daily.    . omeprazole (PRILOSEC) 40 MG capsule Take 1 capsule (40 mg total) by mouth daily. 90 capsule 1  . rizatriptan (MAXALT) 5 MG tablet Take 1 tablet (5 mg total) by mouth as needed for migraine. May repeat in 2 hours if needed 10 tablet 0  . Sodium Sulfate-Mag Sulfate-KCl (SUTAB) [phone] MG TABS Take 1 kit by mouth as directed. 24 tablet 0  . Vitamin D, Ergocalciferol, (DRISDOL) 1.25 MG (50000 UNIT) CAPS capsule Take 1 capsule (50,000 Units total) by mouth every 7 (seven) days for 12 doses. 12 capsule 0  . [DISCONTINUED] pantoprazole (PROTONIX) 40 MG tablet Take 1 tablet (40 mg total) by mouth daily. 90 tablet 4   No current facility-administered medications on file prior to visit.    Allergies  Allergen Reactions  . Hydromorphone  Itching    Family History  Problem Relation Age of Onset  . Heart disease Daughter   . Hypertension Father   . Hypertension Sister   . Hypertension Brother   . Hypertension Maternal Grandfather   . Heart disease Maternal Grandfather   . Hypertension Paternal Grandfather   . Hypertension Brother   . Hypertension Sister   . Thyroid disease Neg Hx   . Colon polyps Neg Hx   . Colon cancer Neg Hx   . Esophageal cancer Neg Hx   . Rectal cancer Neg Hx   . Stomach cancer Neg Hx     BP 124/86   Pulse 64   Ht '5\' 4"'  (1.626 m)   Wt 197 lb (89.4 kg)   LMP 06/06/1996 Comment: GYN  SpO2 95%   BMI 33.81 kg/m    Review of Systems Denies fever and palpitations    Objective:   Physical Exam VS: see vs page GEN: no distress HEAD: head: no deformity eyes: no periorbital swelling, no proptosis external nose and ears are normal NECK: thyroid is not enlarged.  I can palpate the LLP nodule (1-2 cm).  CHEST WALL: no deformity LUNGS: clear to auscultation CV: reg rate and rhythm, no murmur.  MUSCULOSKELETAL: gait is normal and steady EXTEMITIES: no deformity.  no leg edema.   NEURO:  readily moves all 4's.  sensation is intact to touch on all 4's SKIN:  Normal texture and temperature.  No rash or suspicious lesion is visible.   NODES:  None palpable at the neck.  PSYCH: alert, well-oriented.  Does not appear anxious nor depressed.  Lab Results  Component Value Date   TSH <0.01 (L) 03/28/2020   [place] (2018): Left inferior thyroid nodule (labeled 5) meets criteria for surveillance, as designated by the newly established ACR TI-RADS criteria. Surveillance ultrasound study recommended to be performed annually up to 5 years. Previously left-sided nodules have been biopsied with benign, [place] 2, characterization and do not require follow-up.  I have reviewed outside records, and summarized: Pt was noted to have low TSH, and referred here.  I last saw this pt is 2018.        Assessment & Plan:  MNG: is again noted today.  Hyperthyroidism: uncontrolled off rx.  Patient Instructions  I have sent a prescription to your pharmacy, to resume the methimazole. If ever you have fever while taking methimazole, stop it and call us, even if the reason is obvious, because of the risk of a rare side-effect.   It is best to never miss the medication.  However, if you do miss it, next best is to double up the next time. If  you want, you could take the Radioactive iodine treatment pill.  It works like this: We would first check a thyroid "scan" (a special, but easy and painless type of thyroid x ray).  you go to the x-ray department of the hospital to swallow a pill, which contains a miniscule amount of radiation.  You will not notice any symptoms from this.  You will go back to the x-ray department the next day, to lie down in front of a camera.  The results of this will be sent to me.   Based on the results, i hope to order for you a treatment pill of radioactive iodine.  Although it is a larger amount of radiation, you will again notice no symptoms from this.  The pill is gone from your body in a few days (during which you should stay away from other people), but takes several months to work.  Therefore, please return here approximately 6-8 weeks after the treatment.  This treatment has been available for many years, and the only known side-effect is an underactive thyroid.  It is possible that i would eventually prescribe for you a thyroid hormone pill, which is very inexpensive.  You don't have to worry about side-effects of this thyroid hormone pill, because it is the same molecule your thyroid makes. Please come back for a follow-up appointment in 1 month.

## 2020-05-03 NOTE — Patient Instructions (Addendum)
I have sent a prescription to your pharmacy, to resume the methimazole. If ever you have fever while taking methimazole, stop it and call us, even if the reason is obvious, because of the risk of a rare side-effect.   It is best to never miss the medication.  However, if you do miss it, next best is to double up the next time. If you want, you could take the Radioactive iodine treatment pill.  It works like this: We would first check a thyroid "scan" (a special, but easy and painless type of thyroid x ray).  you go to the x-ray department of the hospital to swallow a pill, which contains a miniscule amount of radiation.  You will not notice any symptoms from this.  You will go back to the x-ray department the next day, to lie down in front of a camera.  The results of this will be sent to me.   Based on the results, i hope to order for you a treatment pill of radioactive iodine.  Although it is a larger amount of radiation, you will again notice no symptoms from this.  The pill is gone from your body in a few days (during which you should stay away from other people), but takes several months to work.  Therefore, please return here approximately 6-8 weeks after the treatment.  This treatment has been available for many years, and the only known side-effect is an underactive thyroid.  It is possible that i would eventually prescribe for you a thyroid hormone pill, which is very inexpensive.  You don't have to worry about side-effects of this thyroid hormone pill, because it is the same molecule your thyroid makes. Please come back for a follow-up appointment in 1 month.

## 2020-05-15 ENCOUNTER — Ambulatory Visit: Payer: 59 | Admitting: Neurology

## 2020-06-05 ENCOUNTER — Other Ambulatory Visit: Payer: Self-pay

## 2020-06-07 ENCOUNTER — Other Ambulatory Visit: Payer: Self-pay

## 2020-06-07 ENCOUNTER — Ambulatory Visit: Payer: 59 | Admitting: Endocrinology

## 2020-06-07 VITALS — BP 126/84 | HR 95 | Ht 64.0 in | Wt 200.0 lb

## 2020-06-07 DIAGNOSIS — E0590 Thyrotoxicosis, unspecified without thyrotoxic crisis or storm: Secondary | ICD-10-CM

## 2020-06-07 LAB — T4, FREE: Free T4: 0.8 ng/dL (ref 0.60–1.60)

## 2020-06-07 LAB — TSH: TSH: <0.01 u[IU]/mL — ABNORMAL LOW (ref 0.35–4.50)

## 2020-06-07 NOTE — Progress Notes (Signed)
Subjective:    Patient ID: Kendra Gonzalez, female    DOB: [birth year], 50 y.o.   MRN: [id number]  HPI Pt returns for f/u of hyperthyroidism (due to multinodular goiter; dx'ed early 2016; she had bilat bxs (Cat. 2); she was rx'ed tapazole; she declines RAI).  Back on tapazole, pt states she feels better in general.  Slight tremor persists.   Past Medical History:  Diagnosis Date  . Allergy   . Arthritis   . Asthma   . Endometriosis   . GERD (gastroesophageal reflux disease)   . History of tetanus, diphtheria, and acellular pertussis booster vaccination (Tdap)    07/30/2010  . Hyperlipidemia   . Kidney stones   . Pancreatitis     Past Surgical History:  Procedure Laterality Date  . ABDOMINAL HYSTERECTOMY    . arthroscopic knee surgery     . carpel tunnel    . CHOLECYSTECTOMY    . ERCP  01/08/2012   Procedure: ENDOSCOPIC RETROGRADE CHOLANGIOPANCREATOGRAPHY (ERCP);  Surgeon: Beryle Beams, MD;  Location: [facility] ENDOSCOPY;  Service: Endoscopy;  Laterality: N/A;  . HIP SURGERY    . oophrectomy  2011   secondary to cysts  . TUBAL LIGATION    . UPPER GASTROINTESTINAL ENDOSCOPY  2016    Social History   Socioeconomic History  . Marital status: Married    Spouse name: Fritz Pickerel  . Number of children: 2  . Years of education: Not on file  . Highest education level: Not on file  Occupational History  . Occupation: Nursing Aid    Employer: [employer]  Tobacco Use  . Smoking status: Never Smoker  . Smokeless tobacco: Never Used  Vaping Use  . Vaping Use: Never used  Substance and Sexual Activity  . Alcohol use: No  . Drug use: No  . Sexual activity: Yes    Birth control/protection: None  Other Topics Concern  . Not on file  Social History Narrative   Works as a Education officer, environmental.     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   Adult children live in [place], [place] and [place].   Social Determinants of Health   Financial Resource Strain: Not on file  Food Insecurity: Not on  file  Transportation Needs: Not on file  Physical Activity: Not on file  Stress: Not on file  Social Connections: Not on file  Intimate Partner Violence: Not on file    Current Outpatient Medications on File Prior to Visit  Medication Sig Dispense Refill  . acetaminophen (TYLENOL) 500 MG tablet Take 500 mg by mouth every 6 (six) hours as needed.    Marland Kitchen albuterol (PROVENTIL) (2.5 MG/3ML) 0.083% nebulizer solution Take 3 mLs (2.5 mg total) by nebulization every 6 (six) hours as needed for wheezing or shortness of breath. 150 mL 1  . albuterol (VENTOLIN HFA) 108 (90 Base) MCG/ACT inhaler Inhale 2 puffs into the lungs every 6 (six) hours as needed for wheezing or shortness of breath. 18 g 2  . Cyanocobalamin 5000 MCG/ML LIQD Place under the tongue.    . diclofenac sodium (VOLTAREN) 1 % GEL Apply up to three times a day as needed to large joint. 3 Tube 4  . fluticasone-salmeterol (ADVAIR HFA) 115-21 MCG/ACT inhaler Inhale 2 puffs into the lungs 2 (two) times daily. 1 Inhaler 2  . magnesium gluconate (MAGONATE) 500 MG tablet Take 500 mg by mouth at bedtime.    . methimazole (TAPAZOLE) 10 MG tablet Take 1 tablet (10 mg total) by  mouth daily. 90 tablet 3  . Multiple Vitamins-Minerals (MULTIVITAMIN WITH MINERALS) tablet Take 1 tablet by mouth daily.    Marland Kitchen omeprazole (PRILOSEC) 40 MG capsule Take 1 capsule (40 mg total) by mouth daily. 90 capsule 1  . rizatriptan (MAXALT) 5 MG tablet Take 1 tablet (5 mg total) by mouth as needed for migraine. May repeat in 2 hours if needed 10 tablet 0  . Sodium Sulfate-Mag Sulfate-KCl (SUTAB) [phone] MG TABS Take 1 kit by mouth as directed. 24 tablet 0  . Vitamin D, Ergocalciferol, (DRISDOL) 1.25 MG (50000 UNIT) CAPS capsule Take 1 capsule (50,000 Units total) by mouth every 7 (seven) days for 12 doses. 12 capsule 0  . [DISCONTINUED] pantoprazole (PROTONIX) 40 MG tablet Take 1 tablet (40 mg total) by mouth daily. 90 tablet 4   No current facility-administered  medications on file prior to visit.    Allergies  Allergen Reactions  . Hydromorphone Itching    Family History  Problem Relation Age of Onset  . Heart disease Daughter   . Hypertension Father   . Hypertension Sister   . Hypertension Brother   . Hypertension Maternal Grandfather   . Heart disease Maternal Grandfather   . Hypertension Paternal Grandfather   . Hypertension Brother   . Hypertension Sister   . Thyroid disease Neg Hx   . Colon polyps Neg Hx   . Colon cancer Neg Hx   . Esophageal cancer Neg Hx   . Rectal cancer Neg Hx   . Stomach cancer Neg Hx     BP 126/84 (BP Location: Right Arm, Patient Position: Sitting, Cuff Size: Large)   Pulse 95   Ht '5\' 4"'  (1.626 m)   Wt 200 lb (90.7 kg)   LMP 06/06/1996 Comment: GYN  SpO2 98%   BMI 34.33 kg/m    Review of Systems Denies fever    Objective:   Physical Exam VITAL SIGNS:  See vs page GENERAL: no distress NECK: thyroid is slightly enlarged, with multinodular surface.    Lab Results  Component Value Date   TSH <0.01 Repeated and verified X2. (L) 06/07/2020       Assessment & Plan:  Hyperthyroidism: uncontrolled.  Please continue the same methimazole, as it will prob work more with time.

## 2020-06-07 NOTE — Patient Instructions (Addendum)
Blood tests are requested for you today.  We'll let you know about the results.  If ever you have fever while taking methimazole, stop it and call us, even if the reason is obvious, because of the risk of a rare side-effect. It is best to never miss the medication.  However, if you do miss it, next best is to double up the next time. In the future, you can choose 1 of the other treatment options we have discussed.   Please come back for a follow-up appointment in 2-3 months.

## 2020-07-16 ENCOUNTER — Other Ambulatory Visit: Payer: Self-pay

## 2020-07-16 ENCOUNTER — Ambulatory Visit: Payer: Self-pay

## 2020-07-16 ENCOUNTER — Encounter: Payer: Self-pay | Admitting: Orthopaedic Surgery

## 2020-07-16 ENCOUNTER — Ambulatory Visit: Payer: 59 | Admitting: Orthopaedic Surgery

## 2020-07-16 VITALS — Ht 64.0 in | Wt 200.0 lb

## 2020-07-16 DIAGNOSIS — M25562 Pain in left knee: Secondary | ICD-10-CM | POA: Diagnosis not present

## 2020-07-16 DIAGNOSIS — M1712 Unilateral primary osteoarthritis, left knee: Secondary | ICD-10-CM | POA: Diagnosis not present

## 2020-07-16 DIAGNOSIS — G8929 Other chronic pain: Secondary | ICD-10-CM

## 2020-07-16 MED ORDER — LIDOCAINE HCL 1 % IJ SOLN
2.0000 mL | INTRAMUSCULAR | Status: AC | PRN
  Administered 2020-07-16: 2 mL

## 2020-07-16 MED ORDER — BUPIVACAINE HCL 0.25 % IJ SOLN
2.0000 mL | INTRAMUSCULAR | Status: AC | PRN
  Administered 2020-07-16: 2 mL via INTRA_ARTICULAR

## 2020-07-16 NOTE — Progress Notes (Signed)
Office Visit Note   Patient: Kendra Gonzalez           Date of Birth: Aug 31, 1970           MRN: [id number] Visit Date: 07/16/2020              Requested by: Isaac Bliss, Rayford Halsted, MD [place],  [place] [address] PCP: Isaac Bliss, Rayford Halsted, MD   Assessment & Plan: Visit Diagnoses:  1. Chronic pain of left knee   2. Unilateral primary osteoarthritis, left knee     Plan: Will inject medial compartment left knee with betamethasone and monitor response.  Kendra Gonzalez has osteoarthritis which hopefully will respond to the above  Follow-Up Instructions: Return if symptoms worsen or fail to improve.   Orders:  Orders Placed This Encounter  Procedures  . Large Joint Inj: L knee  . XR KNEE 3 VIEW LEFT   No orders of the defined types were placed in this encounter.     Procedures: Large Joint Inj: L knee on 07/16/2020 4:35 PM Indications: pain and diagnostic evaluation Details: 25 G 1.5 in needle, anteromedial approach  Arthrogram: No  Medications: 2 mL lidocaine 1 %; 2 mL bupivacaine 0.25 %  12 mg betamethasone injected into the medial compartment left knee with Xylocaine and Marcaine Procedure, treatment alternatives, risks and benefits explained, specific risks discussed. Consent was given by the patient. Patient was prepped and draped in the usual sterile fashion.       Clinical Data: No additional findings.   Subjective: Chief Complaint  Patient presents with  . Left Knee - Pain  Patient presents today for left knee pain. She states that her knee has been hurting for years, but worsening over time. Her pain is worse at the patella. She states that it grinds, buckles, catches, and swells. She has more pain with walking. She takes Tylenol for pain relief. She does recall having arthroscopic surgery many years ago on both knees.  Has been diagnosed with osteoarthritis both knees in the past.  No recent injury or  trauma  HPI  Review of Systems   Objective: Vital Signs: Ht 5\' 4"  (1.626 m)   Wt 200 lb (90.7 kg)   LMP 06/06/1996 Comment: GYN  BMI 34.33 kg/m   Physical Exam Constitutional:      Appearance: She is well-developed.  Eyes:     Pupils: Pupils are equal, round, and reactive to light.  Pulmonary:     Effort: Pulmonary effort is normal.  Skin:    General: Skin is warm and dry.  Neurological:     Mental Status: She is alert and oriented to person, place, and time.  Psychiatric:        Behavior: Behavior normal.     Ortho Exam left knee with minimal patellar crepitation but no pain.  No effusion.  Predominately medial joint pain that was mild mostly anteriorly.  No lateral joint pain.  Full extension of flexed over 100 degrees without instability  Specialty Comments:  No specialty comments available.  Imaging: XR KNEE 3 VIEW LEFT  Result Date: 07/16/2020 Films of the left knee obtained in 3 projections standing.  There are degenerative changes in all 3 compartments.  There are small peripheral osteophytes along the medial lateral femoral condyle and a large accumulation of chronic calcification within the medial collateral ligament on its femoral attachment. no ectopic calcification or acute changes.    PMFS History: Patient Active Problem List  Diagnosis Date Noted  . Unilateral primary osteoarthritis, left knee 07/16/2020  . Presence of left artificial hip joint 07/09/2017  . Hyperthyroidism 11/03/2016  . Multinodular goiter 04/17/2015  . History of stomach ulcers 03/22/2015  . Chronic pain of left heel 04/23/2014  . Chronic pain syndrome 03/19/2014  . Asthma 01/04/2013  . BMI 29.0-29.9,adult 10/12/2011  . 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 Hyperlipidemia 02/16/2007  . GERD 02/16/2007   Past Medical History:  Diagnosis Date  . Allergy   . Arthritis   . Asthma   . Endometriosis   . GERD (gastroesophageal reflux disease)   . History of tetanus, diphtheria,  and acellular pertussis booster vaccination (Tdap)    07/30/2010  . Hyperlipidemia   . Kidney stones   . Pancreatitis     Family History  Problem Relation Age of Onset  . Heart disease Daughter   . Hypertension Father   . Hypertension Sister   . Hypertension Brother   . Hypertension Maternal Grandfather   . Heart disease Maternal Grandfather   . Hypertension Paternal Grandfather   . Hypertension Brother   . Hypertension Sister   . Thyroid disease Neg Hx   . Colon polyps Neg Hx   . Colon cancer Neg Hx   . Esophageal cancer Neg Hx   . Rectal cancer Neg Hx   . Stomach cancer Neg Hx     Past Surgical History:  Procedure Laterality Date  . ABDOMINAL HYSTERECTOMY    . arthroscopic knee surgery     . carpel tunnel    . CHOLECYSTECTOMY    . ERCP  01/08/2012   Procedure: ENDOSCOPIC RETROGRADE CHOLANGIOPANCREATOGRAPHY (ERCP);  Surgeon: Beryle Beams, MD;  Location: [facility] ENDOSCOPY;  Service: Endoscopy;  Laterality: N/A;  . HIP SURGERY    . oophrectomy  2011   secondary to cysts  . TUBAL LIGATION    . UPPER GASTROINTESTINAL ENDOSCOPY  2016   Social History   Occupational History  . Occupation: Nursing Aid    Employer: [employer]  Tobacco Use  . Smoking status: Never Smoker  . Smokeless tobacco: Never Used  Vaping Use  . Vaping Use: Never used  Substance and Sexual Activity  . Alcohol use: No  . Drug use: No  . Sexual activity: Yes    Birth control/protection: None

## 2020-07-23 ENCOUNTER — Encounter: Payer: Self-pay | Admitting: Endocrinology

## 2020-07-26 ENCOUNTER — Other Ambulatory Visit: Payer: Self-pay | Admitting: Endocrinology

## 2020-07-26 DIAGNOSIS — E042 Nontoxic multinodular goiter: Secondary | ICD-10-CM

## 2020-07-27 ENCOUNTER — Ambulatory Visit
Admission: EM | Admit: 2020-07-27 | Discharge: 2020-07-27 | Disposition: A | Discharge status: Home / Self Care | Payer: 59 | Attending: Family Medicine | Admitting: Family Medicine

## 2020-07-27 ENCOUNTER — Other Ambulatory Visit: Payer: Self-pay

## 2020-07-27 DIAGNOSIS — M542 Cervicalgia: Secondary | ICD-10-CM

## 2020-07-27 MED ORDER — DICLOFENAC SODIUM 50 MG PO TBEC: 50.0000 mg | DELAYED_RELEASE_TABLET | Freq: Two times a day (BID) | ORAL | 0 refills | Status: AC

## 2020-07-27 MED ORDER — TIZANIDINE HCL 2 MG PO TABS: 2.0000 mg | ORAL_TABLET | Freq: Three times a day (TID) | ORAL | 0 refills | Status: DC | PRN

## 2020-07-27 NOTE — ED Provider Notes (Signed)
[name]    CSN: [id number] Arrival date & time: 07/27/20  0806      History   Chief Complaint Chief Complaint  Patient presents with  . Neck Pain    HPI Kendra Gonzalez is a 50 y.o. female.   HPI  Neck pain present for at least one week.  The pain is not aggravated by rotational movements or hyperextension or flexion.  Pain is localized only to the left lateral neck and with lying down pain is worse.  She endorses left ear pain related to neck pain.  Denies any associated injury or left shoulder pain.  Patient has a US of the thyroid pending as she is followed by endocrinology for management of multinodular goiter and hypothyroidism.  She is without fever and denies any limits and ranges of motion.  Past Medical History:  Diagnosis Date  . Allergy   . Arthritis   . Asthma   . Endometriosis   . GERD (gastroesophageal reflux disease)   . History of tetanus, diphtheria, and acellular pertussis booster vaccination (Tdap)    07/30/2010  . Hyperlipidemia   . Kidney stones   . Pancreatitis     Patient Active Problem List   Diagnosis Date Noted  . Unilateral primary osteoarthritis, left knee 07/16/2020  . Presence of left artificial hip joint 07/09/2017  . Hyperthyroidism 11/03/2016  . Multinodular goiter 04/17/2015  . History of stomach ulcers 03/22/2015  . Chronic pain of left heel 04/23/2014  . Chronic pain syndrome 03/19/2014  . Asthma 01/04/2013  . BMI 29.0-29.9,adult 10/12/2011  . 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 Hyperlipidemia 02/16/2007  . GERD 02/16/2007    Past Surgical History:  Procedure Laterality Date  . ABDOMINAL HYSTERECTOMY    . arthroscopic knee surgery     . carpel tunnel    . CHOLECYSTECTOMY    . ERCP  01/08/2012   Procedure: ENDOSCOPIC RETROGRADE CHOLANGIOPANCREATOGRAPHY (ERCP);  Surgeon: Beryle Beams, MD;  Location: [facility]COPY;  Service: Endoscopy;  Laterality: N/A;  . HIP SURGERY    . oophrectomy  2011    secondary to cysts  . TUBAL LIGATION    . UPPER GASTROINTESTINAL ENDOSCOPY  2016    OB History   No obstetric history on file.      Home Medications    Prior to Admission medications   Medication Sig Start Date End Date Taking? Authorizing Provider  acetaminophen (TYLENOL) 500 MG tablet Take 500 mg by mouth every 6 (six) hours as needed.    [provider]  albuterol (PROVENTIL) (2.5 MG/3ML) 0.083% nebulizer solution Take 3 mLs (2.5 mg total) by nebulization every 6 (six) hours as needed for wheezing or shortness of breath. 09/19/19   Isaac Bliss, Rayford Halsted, MD  albuterol (VENTOLIN HFA) 108 (90 Base) MCG/ACT inhaler Inhale 2 puffs into the lungs every 6 (six) hours as needed for wheezing or shortness of breath. 09/19/19   Isaac Bliss, Rayford Halsted, MD  Cyanocobalamin 5000 MCG/ML LIQD Place under the tongue.    [provider]  diclofenac sodium (VOLTAREN) 1 % GEL Apply up to three times a day as needed to large joint. 07/18/18   Garald Balding, MD  fluticasone-salmeterol (ADVAIR HFA) [phone] MCG/ACT inhaler Inhale 2 puffs into the lungs 2 (two) times daily. 09/19/19   Isaac Bliss, Rayford Halsted, MD  magnesium gluconate (MAGONATE) 500 MG tablet Take 500 mg by mouth at bedtime.    [provider]  methimazole (TAPAZOLE) 10 MG tablet  Take 1 tablet (10 mg total) by mouth daily. 05/03/20   Renato Shin, MD  Multiple Vitamins-Minerals (MULTIVITAMIN WITH MINERALS) tablet Take 1 tablet by mouth daily.    [provider]  omeprazole (PRILOSEC) 40 MG capsule Take 1 capsule (40 mg total) by mouth daily. 03/28/20   Isaac Bliss, Rayford Halsted, MD  rizatriptan (MAXALT) 5 MG tablet Take 1 tablet (5 mg total) by mouth as needed for migraine. May repeat in 2 hours if needed 03/28/20   Isaac Bliss, Rayford Halsted, MD  Sodium Sulfate-Mag Sulfate-KCl (SUTAB) [phone] MG TABS Take 1 kit by mouth as directed. 10/27/19   Thornton Park, MD  pantoprazole (PROTONIX) 40 MG  tablet Take 1 tablet (40 mg total) by mouth daily. 09/16/17 11/03/18  Mancel Bale, PA-C    Family History Family History  Problem Relation Age of Onset  . Heart disease Daughter   . Hypertension Father   . Hypertension Sister   . Hypertension Brother   . Hypertension Maternal Grandfather   . Heart disease Maternal Grandfather   . Hypertension Paternal Grandfather   . Hypertension Brother   . Hypertension Sister   . Thyroid disease Neg Hx   . Colon polyps Neg Hx   . Colon cancer Neg Hx   . Esophageal cancer Neg Hx   . Rectal cancer Neg Hx   . Stomach cancer Neg Hx     Social History Social History   Tobacco Use  . Smoking status: Never Smoker  . Smokeless tobacco: Never Used  Vaping Use  . Vaping Use: Never used  Substance Use Topics  . Alcohol use: No  . Drug use: No     Allergies   Hydromorphone   Review of Systems Review of Systems Pertinent negatives listed in HPI   Physical Exam Triage Vital Signs ED Triage Vitals  Enc Vitals Group     BP 07/27/20 0817 136/84     Pulse Rate 07/27/20 0817 84     Resp 07/27/20 0817 16     Temp 07/27/20 0817 98.1 F (36.7 C)     Temp Source 07/27/20 0817 Oral     SpO2 07/27/20 0817 98 %     Weight --      Height --      Head Circumference --      Peak Flow --      Pain Score 07/27/20 0818 5     Pain Loc --      Pain Edu? --      Excl. in [place]? --    No data found.  Updated Vital Signs BP 136/84 (BP Location: Left Arm)   Pulse 84   Temp 98.1 F (36.7 C) (Oral)   Resp 16   LMP 06/06/1996 Comment: GYN  SpO2 98%   Visual Acuity Right Eye Distance:   Left Eye Distance:   Bilateral Distance:    Right Eye Near:   Left Eye Near:    Bilateral Near:     Physical Exam Constitutional:      Appearance: Normal appearance.  HENT:     Head: Normocephalic.     Right Ear: Tympanic membrane, ear canal and external ear normal.     Left Ear: Tympanic membrane, ear canal and external ear normal.  Neck:      Comments: Tenderness with palpation of the sternocleidomastoid muscle (left) Cardiovascular:     Rate and Rhythm: Normal rate and regular rhythm.  Pulmonary:     Effort: Pulmonary effort is  normal.     Breath sounds: Normal breath sounds.  Musculoskeletal:     Cervical back: Normal range of motion.  Skin:    General: Skin is warm.     Capillary Refill: Capillary refill takes less than 2 seconds.  Neurological:     General: No focal deficit present.     Mental Status: She is alert and oriented to person, place, and time.     Cranial Nerves: No cranial nerve deficit.     Motor: No weakness.     Gait: Gait normal.  Psychiatric:        Mood and Affect: Mood normal.        Behavior: Behavior normal.        Thought Content: Thought content normal.        Judgment: Judgment normal.      UC Treatments / Results  Labs (all labs ordered are listed, but only abnormal results are displayed) Labs Reviewed - No data to display  EKG   Radiology No results found.  Procedures Procedures (including critical care time)  Medications Ordered in UC Medications - No data to display  Initial Impression / Assessment and Plan / UC Course  I have reviewed the triage vital signs and the nursing notes.  Pertinent labs & imaging results that were available during my care of the patient were reviewed by me and considered in my medical decision making (see chart for details).      Trial tizanidine 2 to 4 mg every 8 hours as needed for neck pain and for inflammation diclofenac 50 mg twice daily over the next 7 to 10 days.  Also encouraged heat application to reduce inflammation.  Keep follow-up with endocrinology.  Follow-up with PCP if symptoms persist and do not improve with prescribed therapy.  Final Clinical Impressions(s) / UC Diagnoses   Final diagnoses:  Neck pain on left side   Discharge Instructions   None    ED Prescriptions    Medication Sig Dispense Auth. Provider    tiZANidine (ZANAFLEX) 2 MG tablet Take 1-2 tablets (2-4 mg total) by mouth every 8 (eight) hours as needed for muscle spasms. 20 tablet Scot Jun, FNP   diclofenac (VOLTAREN) 50 MG EC tablet Take 1 tablet (50 mg total) by mouth 2 (two) times daily for 14 days. 28 tablet Scot Jun, FNP     PDMP not reviewed this encounter.   Scot Jun, [place] 07/27/20 [phone]

## 2020-08-06 ENCOUNTER — Other Ambulatory Visit: Payer: Self-pay

## 2020-08-06 ENCOUNTER — Ambulatory Visit: Payer: 59 | Admitting: Endocrinology

## 2020-08-06 VITALS — BP 136/82 | HR 70 | Ht 64.0 in | Wt 200.0 lb

## 2020-08-06 DIAGNOSIS — E0590 Thyrotoxicosis, unspecified without thyrotoxic crisis or storm: Secondary | ICD-10-CM | POA: Diagnosis not present

## 2020-08-06 LAB — TSH: TSH: <0.01 u[IU]/mL — ABNORMAL LOW (ref 0.35–4.50)

## 2020-08-06 LAB — T4, FREE: Free T4: 0.96 ng/dL (ref 0.60–1.60)

## 2020-08-06 MED ORDER — METHIMAZOLE 10 MG PO TABS: 10.0000 mg | ORAL_TABLET | Freq: Two times a day (BID) | ORAL | 3 refills | Status: DC

## 2020-08-06 NOTE — Patient Instructions (Addendum)
Blood tests are requested for you today.  We'll let you know about these results, and also for the ultrasound.   If ever you have fever while taking methimazole, stop it and call us, even if the reason is obvious, because of the risk of a rare side-effect. It is best to never miss the medication.  However, if you do miss it, next best is to double up the next time.   Please come back for a follow-up appointment in 3 months.

## 2020-08-06 NOTE — Progress Notes (Signed)
Subjective:    Patient ID: Kendra Gonzalez, female    DOB: 11/19/1970, 50 y.o.   MRN: [id number]  HPI Pt returns for f/u of hyperthyroidism (due to multinodular goiter; dx'ed early 2016; she had bilat bxs (Cat. 2); she was rx'ed tapazole; she declines RAI).  Back on tapazole, pt states she feels better in general.   Past Medical History:  Diagnosis Date  . Allergy   . Arthritis   . Asthma   . Endometriosis   . GERD (gastroesophageal reflux disease)   . History of tetanus, diphtheria, and acellular pertussis booster vaccination (Tdap)    07/30/2010  . Hyperlipidemia   . Kidney stones   . Pancreatitis     Past Surgical History:  Procedure Laterality Date  . ABDOMINAL HYSTERECTOMY    . arthroscopic knee surgery     . carpel tunnel    . CHOLECYSTECTOMY    . ERCP  01/08/2012   Procedure: ENDOSCOPIC RETROGRADE CHOLANGIOPANCREATOGRAPHY (ERCP);  Surgeon: Beryle Beams, MD;  Location: [facility] ENDOSCOPY;  Service: Endoscopy;  Laterality: N/A;  . HIP SURGERY    . oophrectomy  2011   secondary to cysts  . TUBAL LIGATION    . UPPER GASTROINTESTINAL ENDOSCOPY  2016    Social History   Socioeconomic History  . Marital status: Married    Spouse name: Fritz Pickerel  . Number of children: 2  . Years of education: Not on file  . Highest education level: Not on file  Occupational History  . Occupation: Nursing Aid    Employer: [employer]  Tobacco Use  . Smoking status: Never Smoker  . Smokeless tobacco: Never Used  Vaping Use  . Vaping Use: Never used  Substance and Sexual Activity  . Alcohol use: No  . Drug use: No  . Sexual activity: Yes    Birth control/protection: None  Other Topics Concern  . Not on file  Social History Narrative   Works as a Education officer, environmental.     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   Adult children live in [place], [place] and [place].   Social Determinants of Health   Financial Resource Strain: Not on file  Food Insecurity: Not on file  Transportation Needs:  Not on file  Physical Activity: Not on file  Stress: Not on file  Social Connections: Not on file  Intimate Partner Violence: Not on file    Current Outpatient Medications on File Prior to Visit  Medication Sig Dispense Refill  . acetaminophen (TYLENOL) 500 MG tablet Take 500 mg by mouth every 6 (six) hours as needed.    Marland Kitchen albuterol (PROVENTIL) (2.5 MG/3ML) 0.083% nebulizer solution Take 3 mLs (2.5 mg total) by nebulization every 6 (six) hours as needed for wheezing or shortness of breath. 150 mL 1  . albuterol (VENTOLIN HFA) 108 (90 Base) MCG/ACT inhaler Inhale 2 puffs into the lungs every 6 (six) hours as needed for wheezing or shortness of breath. 18 g 2  . Cyanocobalamin 5000 MCG/ML LIQD Place under the tongue.    . diclofenac (VOLTAREN) 50 MG EC tablet Take 1 tablet (50 mg total) by mouth 2 (two) times daily for 14 days. 28 tablet 0  . diclofenac sodium (VOLTAREN) 1 % GEL Apply up to three times a day as needed to large joint. 3 Tube 4  . fluticasone-salmeterol (ADVAIR HFA) 115-21 MCG/ACT inhaler Inhale 2 puffs into the lungs 2 (two) times daily. 1 Inhaler 2  . magnesium gluconate (MAGONATE) 500 MG tablet Take  500 mg by mouth at bedtime.    . Multiple Vitamins-Minerals (MULTIVITAMIN WITH MINERALS) tablet Take 1 tablet by mouth daily.    Marland Kitchen omeprazole (PRILOSEC) 40 MG capsule Take 1 capsule (40 mg total) by mouth daily. 90 capsule 1  . rizatriptan (MAXALT) 5 MG tablet Take 1 tablet (5 mg total) by mouth as needed for migraine. May repeat in 2 hours if needed 10 tablet 0  . Sodium Sulfate-Mag Sulfate-KCl (SUTAB) [id number] MG TABS Take 1 kit by mouth as directed. 24 tablet 0  . tiZANidine (ZANAFLEX) 2 MG tablet Take 1-2 tablets (2-4 mg total) by mouth every 8 (eight) hours as needed for muscle spasms. 20 tablet 0  . [DISCONTINUED] pantoprazole (PROTONIX) 40 MG tablet Take 1 tablet (40 mg total) by mouth daily. 90 tablet 4   No current facility-administered medications on file prior to  visit.    Allergies  Allergen Reactions  . Hydromorphone Itching    Family History  Problem Relation Age of Onset  . Heart disease Daughter   . Hypertension Father   . Hypertension Sister   . Hypertension Brother   . Hypertension Maternal Grandfather   . Heart disease Maternal Grandfather   . Hypertension Paternal Grandfather   . Hypertension Brother   . Hypertension Sister   . Thyroid disease Neg Hx   . Colon polyps Neg Hx   . Colon cancer Neg Hx   . Esophageal cancer Neg Hx   . Rectal cancer Neg Hx   . Stomach cancer Neg Hx     BP 136/82 (BP Location: Right Arm, Patient Position: Sitting, Cuff Size: Large)   Pulse 70   Ht '5\' 4"'  (1.626 m)   Wt 200 lb (90.7 kg)   LMP 06/06/1996 Comment: GYN  SpO2 99%   BMI 34.33 kg/m    Review of Systems Denies fever.      Objective:   Physical Exam VITAL SIGNS:  See vs page GENERAL: no distress NECK: thyroid is slightly enlarged, with multinodular surface.     Lab Results  Component Value Date   TSH <0.01 (L) 08/06/2020      Assessment & Plan:  Hyperthyroidism: uncontrolled.  increase the methimazole to 10 mg, twice a day. Please come back for a follow-up appointment in 3 months

## 2020-08-09 ENCOUNTER — Ambulatory Visit
Admission: RE | Admit: 2020-08-09 | Discharge: 2020-08-09 | Disposition: A | Discharge status: Home / Self Care | Payer: 59 | Source: Ambulatory Visit | Attending: Endocrinology | Admitting: Endocrinology

## 2020-08-09 DIAGNOSIS — E042 Nontoxic multinodular goiter: Secondary | ICD-10-CM

## 2020-08-13 ENCOUNTER — Encounter: Payer: Self-pay | Admitting: Endocrinology

## 2020-08-13 ENCOUNTER — Telehealth: Payer: Self-pay | Admitting: Internal Medicine

## 2020-08-13 NOTE — Telephone Encounter (Signed)
Patient is calling and stated that she has some swelling in her neck from her having nodules and wanted to know if that could be associated with the pain she is experiencing on that side, please advise. CB [place] [phone]

## 2020-08-14 NOTE — Telephone Encounter (Signed)
Appointment scheuled

## 2020-08-15 ENCOUNTER — Other Ambulatory Visit: Payer: Self-pay

## 2020-08-16 ENCOUNTER — Encounter: Payer: Self-pay | Admitting: Internal Medicine

## 2020-08-16 ENCOUNTER — Ambulatory Visit: Payer: 59 | Admitting: Internal Medicine

## 2020-08-16 VITALS — BP 120/80 | HR 71 | Temp 98.0°F | Wt 202.7 lb

## 2020-08-16 DIAGNOSIS — M436 Torticollis: Secondary | ICD-10-CM

## 2020-08-16 MED ORDER — CYCLOBENZAPRINE HCL 5 MG PO TABS: 5.0000 mg | ORAL_TABLET | Freq: Every evening | ORAL | 0 refills | Status: DC | PRN

## 2020-08-16 MED ORDER — DICLOFENAC SODIUM 75 MG PO TBEC: 75.0000 mg | DELAYED_RELEASE_TABLET | Freq: Two times a day (BID) | ORAL | 0 refills | Status: DC

## 2020-08-16 NOTE — Progress Notes (Signed)
Acute office Visit     This visit occurred during the SARS-CoV-2 public health emergency.  Safety protocols were in place, including screening questions prior to the visit, additional usage of staff PPE, and extensive cleaning of exam room while observing appropriate contact time as indicated for disinfecting solutions.    CC/Reason for Visit: Continued left-sided neck pain  HPI: Kendra Gonzalez is a 50 y.o. female who is coming in today for the above mentioned reasons.  She was seen in the ED for left-sided neck pain.  It was diagnosed as muscular and she was given diclofenac tablets and tizanidine.  In the interim she saw her endocrinologist who does not believe that her multinodular thyroid goiter is the cause of this neck pain.  It is especially painful at night.  It hurts more when she turns her head to the right.  Past Medical/Surgical History: Past Medical History:  Diagnosis Date  . Allergy   . Arthritis   . Asthma   . Endometriosis   . GERD (gastroesophageal reflux disease)   . History of tetanus, diphtheria, and acellular pertussis booster vaccination (Tdap)    07/30/2010  . Hyperlipidemia   . Kidney stones   . Pancreatitis     Past Surgical History:  Procedure Laterality Date  . ABDOMINAL HYSTERECTOMY    . arthroscopic knee surgery     . carpel tunnel    . CHOLECYSTECTOMY    . ERCP  01/08/2012   Procedure: ENDOSCOPIC RETROGRADE CHOLANGIOPANCREATOGRAPHY (ERCP);  Surgeon: Beryle Beams, MD;  Location: [facility] ENDOSCOPY;  Service: Endoscopy;  Laterality: N/A;  . HIP SURGERY    . oophrectomy  2011   secondary to cysts  . TUBAL LIGATION    . UPPER GASTROINTESTINAL ENDOSCOPY  2016    Social History:  reports that she has never smoked. She has never used smokeless tobacco. She reports that she does not drink alcohol and does not use drugs.  Allergies: Allergies  Allergen Reactions  . Hydromorphone Itching    Family History:  Family History  Problem  Relation Age of Onset  . Heart disease Daughter   . Hypertension Father   . Hypertension Sister   . Hypertension Brother   . Hypertension Maternal Grandfather   . Heart disease Maternal Grandfather   . Hypertension Paternal Grandfather   . Hypertension Brother   . Hypertension Sister   . Thyroid disease Neg Hx   . Colon polyps Neg Hx   . Colon cancer Neg Hx   . Esophageal cancer Neg Hx   . Rectal cancer Neg Hx   . Stomach cancer Neg Hx      Current Outpatient Medications:  .  acetaminophen (TYLENOL) 500 MG tablet, Take 500 mg by mouth every 6 (six) hours as needed., Disp: , Rfl:  .  albuterol (PROVENTIL) (2.5 MG/3ML) 0.083% nebulizer solution, Take 3 mLs (2.5 mg total) by nebulization every 6 (six) hours as needed for wheezing or shortness of breath., Disp: 150 mL, Rfl: 1 .  albuterol (VENTOLIN HFA) 108 (90 Base) MCG/ACT inhaler, Inhale 2 puffs into the lungs every 6 (six) hours as needed for wheezing or shortness of breath., Disp: 18 g, Rfl: 2 .  Cyanocobalamin 5000 MCG/ML LIQD, Place under the tongue., Disp: , Rfl:  .  cyclobenzaprine (FLEXERIL) 5 MG tablet, Take 1 tablet (5 mg total) by mouth at bedtime as needed for muscle spasms., Disp: 30 tablet, Rfl: 0 .  diclofenac (VOLTAREN) 75 MG EC tablet, Take  1 tablet (75 mg total) by mouth 2 (two) times daily., Disp: 30 tablet, Rfl: 0 .  diclofenac sodium (VOLTAREN) 1 % GEL, Apply up to three times a day as needed to large joint., Disp: 3 Tube, Rfl: 4 .  fluticasone-salmeterol (ADVAIR HFA) 115-21 MCG/ACT inhaler, Inhale 2 puffs into the lungs 2 (two) times daily., Disp: 1 Inhaler, Rfl: 2 .  magnesium gluconate (MAGONATE) 500 MG tablet, Take 500 mg by mouth at bedtime., Disp: , Rfl:  .  methimazole (TAPAZOLE) 10 MG tablet, Take 1 tablet (10 mg total) by mouth 2 (two) times daily., Disp: 180 tablet, Rfl: 3 .  Multiple Vitamins-Minerals (MULTIVITAMIN WITH MINERALS) tablet, Take 1 tablet by mouth daily., Disp: , Rfl:  .  omeprazole (PRILOSEC)  40 MG capsule, Take 1 capsule (40 mg total) by mouth daily., Disp: 90 capsule, Rfl: 1 .  rizatriptan (MAXALT) 5 MG tablet, Take 1 tablet (5 mg total) by mouth as needed for migraine. May repeat in 2 hours if needed, Disp: 10 tablet, Rfl: 0 .  Sodium Sulfate-Mag Sulfate-KCl (SUTAB) [phone] MG TABS, Take 1 kit by mouth as directed., Disp: 24 tablet, Rfl: 0 .  tiZANidine (ZANAFLEX) 2 MG tablet, Take 1-2 tablets (2-4 mg total) by mouth every 8 (eight) hours as needed for muscle spasms., Disp: 20 tablet, Rfl: 0  Review of Systems:  Constitutional: Denies fever, chills, diaphoresis, appetite change and fatigue.  HEENT: Denies photophobia, eye pain, redness, hearing loss, ear pain, congestion, sore throat, rhinorrhea, sneezing, mouth sores, trouble swallowing, neck pain, neck stiffness and tinnitus.   Respiratory: Denies SOB, DOE, cough, chest tightness,  and wheezing.   Cardiovascular: Denies chest pain, palpitations and leg swelling.  Gastrointestinal: Denies nausea, vomiting, abdominal pain, diarrhea, constipation, blood in stool and abdominal distention.  Genitourinary: Denies dysuria, urgency, frequency, hematuria, flank pain and difficulty urinating.  Endocrine: Denies: hot or cold intolerance, sweats, changes in hair or nails, polyuria, polydipsia. Musculoskeletal: Denies back pain, joint swelling, arthralgias and gait problem.  Skin: Denies pallor, rash and wound.  Neurological: Denies dizziness, seizures, syncope, weakness, light-headedness, numbness and headaches.  Hematological: Denies adenopathy. Easy bruising, personal or family bleeding history  Psychiatric/Behavioral: Denies suicidal ideation, mood changes, confusion, nervousness, sleep disturbance and agitation    Physical Exam: Vitals:   08/16/20 0700  BP: 120/80  Pulse: 71  Temp: 98 F (36.7 C)  TempSrc: Oral  SpO2: 99%  Weight: 202 lb 11.2 oz (91.9 kg)    Body mass index is 34.79 kg/m.   Constitutional: NAD,  calm, comfortable Eyes: PERRL, lids and conjunctivae normal, wears corrective lenses ENMT: Mucous membranes are moist. Posterior pharynx clear of any exudate or lesions. Normal dentition. Tympanic membrane is pearly white, no erythema or bulging. Neck: Tightness to the left sternocleidomastoid muscle Neurologic: Grossly intact and nonfocal Psychiatric: Normal judgment and insight. Alert and oriented x 3. Normal mood.    Impression and Plan:  Torticollis  - Plan: cyclobenzaprine (FLEXERIL) 5 MG tablet, diclofenac (VOLTAREN) 75 MG EC tablet -She is requesting refills of medications given in the ED including her diclofenac and muscle relaxer. -I have advised icing and local massage therapy in addition to that. -She will return if no improvement in 2 to 3 weeks.    Lelon Frohlich, MD [facility]

## 2020-08-19 ENCOUNTER — Other Ambulatory Visit: Payer: Self-pay | Admitting: Endocrinology

## 2020-08-19 DIAGNOSIS — E0590 Thyrotoxicosis, unspecified without thyrotoxic crisis or storm: Secondary | ICD-10-CM

## 2020-09-13 ENCOUNTER — Ambulatory Visit: Payer: 59 | Admitting: Internal Medicine

## 2020-09-13 ENCOUNTER — Other Ambulatory Visit: Payer: Self-pay

## 2020-09-13 ENCOUNTER — Encounter: Payer: Self-pay | Admitting: Internal Medicine

## 2020-09-13 VITALS — BP 110/80 | HR 86 | Temp 97.6°F | Wt 207.8 lb

## 2020-09-13 DIAGNOSIS — E042 Nontoxic multinodular goiter: Secondary | ICD-10-CM

## 2020-09-26 ENCOUNTER — Ambulatory Visit (INDEPENDENT_AMBULATORY_CARE_PROVIDER_SITE_OTHER): Payer: 59

## 2020-09-26 ENCOUNTER — Other Ambulatory Visit: Payer: Self-pay | Admitting: Podiatry

## 2020-09-26 ENCOUNTER — Other Ambulatory Visit: Payer: Self-pay

## 2020-09-26 ENCOUNTER — Ambulatory Visit: Payer: 59 | Admitting: Podiatry

## 2020-09-26 ENCOUNTER — Encounter: Payer: Self-pay | Admitting: Podiatry

## 2020-09-26 DIAGNOSIS — M722 Plantar fascial fibromatosis: Secondary | ICD-10-CM | POA: Diagnosis not present

## 2020-09-26 DIAGNOSIS — M778 Other enthesopathies, not elsewhere classified: Secondary | ICD-10-CM

## 2020-09-26 MED ORDER — TRIAMCINOLONE ACETONIDE 40 MG/ML IJ SUSP
40.0000 mg | Freq: Once | INTRAMUSCULAR | Status: AC
  Administered 2020-09-26: 40 mg

## 2020-09-26 MED ORDER — METHYLPREDNISOLONE 4 MG PO TBPK: ORAL_TABLET | ORAL | 0 refills | Status: DC

## 2020-09-26 MED ORDER — MELOXICAM 15 MG PO TABS: 15.0000 mg | ORAL_TABLET | Freq: Every day | ORAL | 3 refills | Status: DC

## 2020-09-26 NOTE — Patient Instructions (Signed)

## 2020-09-28 NOTE — Progress Notes (Signed)
Subjective:  Patient ID: Kendra Gonzalez, female    DOB: Feb 26, 1971,  MRN: [id number] HPI Chief Complaint  Patient presents with  . Foot Pain    Plantar heel bilateral (R>L) - aching x 1-2 years, AM pain, worsening, [facility] eval- said PF and injected-no help, taking OTC NSAID at home  . New Patient (Initial Visit)    50 y.o. female presents with the above complaint.   ROS: Denies fever chills nausea vomiting muscle aches pains calf pain back pain chest pain shortness of breath.  Past Medical History:  Diagnosis Date  . Allergy   . Arthritis   . Asthma   . Endometriosis   . GERD (gastroesophageal reflux disease)   . History of tetanus, diphtheria, and acellular pertussis booster vaccination (Tdap)    07/30/2010  . Hyperlipidemia   . Kidney stones   . Pancreatitis    Past Surgical History:  Procedure Laterality Date  . ABDOMINAL HYSTERECTOMY    . arthroscopic knee surgery     . carpel tunnel    . CHOLECYSTECTOMY    . ERCP  01/08/2012   Procedure: ENDOSCOPIC RETROGRADE CHOLANGIOPANCREATOGRAPHY (ERCP);  Surgeon: Beryle Beams, MD;  Location: [facility] ENDOSCOPY;  Service: Endoscopy;  Laterality: N/A;  . HIP SURGERY    . oophrectomy  2011   secondary to cysts  . TUBAL LIGATION    . UPPER GASTROINTESTINAL ENDOSCOPY  2016    Current Outpatient Medications:  .  meloxicam (MOBIC) 15 MG tablet, Take 1 tablet (15 mg total) by mouth daily., Disp: 30 tablet, Rfl: 3 .  methylPREDNISolone (MEDROL DOSEPAK) 4 MG TBPK tablet, 6 day dose pack - take as directed, Disp: 21 tablet, Rfl: 0 .  acetaminophen (TYLENOL) 500 MG tablet, Take 500 mg by mouth every 6 (six) hours as needed., Disp: , Rfl:  .  albuterol (PROVENTIL) (2.5 MG/3ML) 0.083% nebulizer solution, Take 3 mLs (2.5 mg total) by nebulization every 6 (six) hours as needed for wheezing or shortness of breath., Disp: 150 mL, Rfl: 1 .  albuterol (VENTOLIN HFA) 108 (90 Base) MCG/ACT inhaler, Inhale 2 puffs into the lungs every 6  (six) hours as needed for wheezing or shortness of breath., Disp: 18 g, Rfl: 2 .  Cyanocobalamin 5000 MCG/ML LIQD, Place under the tongue., Disp: , Rfl:  .  cyclobenzaprine (FLEXERIL) 5 MG tablet, Take 1 tablet (5 mg total) by mouth at bedtime as needed for muscle spasms., Disp: 30 tablet, Rfl: 0 .  diclofenac (VOLTAREN) 75 MG EC tablet, Take 1 tablet (75 mg total) by mouth 2 (two) times daily., Disp: 30 tablet, Rfl: 0 .  diclofenac sodium (VOLTAREN) 1 % GEL, Apply up to three times a day as needed to large joint., Disp: 3 Tube, Rfl: 4 .  fluticasone-salmeterol (ADVAIR HFA) 115-21 MCG/ACT inhaler, Inhale 2 puffs into the lungs 2 (two) times daily., Disp: 1 Inhaler, Rfl: 2 .  magnesium gluconate (MAGONATE) 500 MG tablet, Take 500 mg by mouth at bedtime., Disp: , Rfl:  .  methimazole (TAPAZOLE) 10 MG tablet, Take 1 tablet (10 mg total) by mouth 2 (two) times daily., Disp: 180 tablet, Rfl: 3 .  Multiple Vitamins-Minerals (MULTIVITAMIN WITH MINERALS) tablet, Take 1 tablet by mouth daily., Disp: , Rfl:  .  neomycin-polymyxin b-dexamethasone (MAXITROL) 3.5-10000-0.1 OINT, , Disp: , Rfl:  .  omeprazole (PRILOSEC) 40 MG capsule, Take 1 capsule (40 mg total) by mouth daily., Disp: 90 capsule, Rfl: 1 .  rizatriptan (MAXALT) 5 MG tablet, Take 1 tablet (  5 mg total) by mouth as needed for migraine. May repeat in 2 hours if needed, Disp: 10 tablet, Rfl: 0 .  Sodium Sulfate-Mag Sulfate-KCl (SUTAB) [phone] MG TABS, Take 1 kit by mouth as directed., Disp: 24 tablet, Rfl: 0 .  tiZANidine (ZANAFLEX) 2 MG tablet, Take 1-2 tablets (2-4 mg total) by mouth every 8 (eight) hours as needed for muscle spasms., Disp: 20 tablet, Rfl: 0  Allergies  Allergen Reactions  . Hydromorphone Itching   Review of Systems Objective:  There were no vitals filed for this visit.  General: Well developed, nourished, in no acute distress, alert and oriented x3   Dermatological: Skin is warm, dry and supple bilateral. Nails x 10  are well maintained; remaining integument appears unremarkable at this time. There are no open sores, no preulcerative lesions, no rash or signs of infection present.  Vascular: Dorsalis Pedis artery and Posterior Tibial artery pedal pulses are 2/4 bilateral with immedate capillary fill time. Pedal hair growth present. No varicosities and no lower extremity edema present bilateral.   Neruologic: Grossly intact via light touch bilateral. Vibratory intact via tuning fork bilateral. Protective threshold with Semmes Wienstein monofilament intact to all pedal sites bilateral. Patellar and Achilles deep tendon reflexes 2+ bilateral. No Babinski or clonus noted bilateral.   Musculoskeletal: No gross boney pedal deformities bilateral. No pain, crepitus, or limitation noted with foot and ankle range of motion bilateral. Muscular strength 5/5 in all groups tested bilateral.  Hallux abductovalgus deformity noted bilateral mild limited range of motion.  Majority of her pain is located around the heel at the plantar medial calcaneal tubercle at the plantar fashion calcaneal insertion site.  Gait: Unassisted, Nonantalgic.    Radiographs:  An osseously mature individual with mild pes planus demonstrates soft tissue increase in density at the plantar fascial calcaneal insertion site right appears to be worse than the left there is plantar spurring as well as posterior spurring.  She also has hallux abductovalgus deformity bilaterally with hallux interphalangeal.  No other significant osseous abnormalities noted.  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  Started her on methylprednisolone to be followed by meloxicam and placed her in bilateral plantar fascial braces.  We discussed appropriate shoe gear stretching exercises ice therapy and shoe gear modifications.  We discussed  etiology pathology conservative versus surgical therapies.  I will follow-up with her in 1 month should she have questions or concerns she will notify us immediately.     Jerrilyn Messinger T. [place], [place]

## 2020-09-30 ENCOUNTER — Encounter: Payer: Self-pay | Admitting: Endocrinology

## 2020-09-30 NOTE — Telephone Encounter (Signed)
Kendra Gonzalez,  Could you help this patient?

## 2020-10-08 ENCOUNTER — Ambulatory Visit: Payer: Self-pay | Admitting: Surgery

## 2020-10-29 ENCOUNTER — Ambulatory Visit: Payer: 59 | Admitting: Podiatry

## 2020-10-29 ENCOUNTER — Encounter: Payer: Self-pay | Admitting: Podiatry

## 2020-10-29 ENCOUNTER — Other Ambulatory Visit: Payer: Self-pay

## 2020-10-29 DIAGNOSIS — M722 Plantar fascial fibromatosis: Secondary | ICD-10-CM | POA: Diagnosis not present

## 2020-10-29 NOTE — Progress Notes (Signed)
She presents today for follow-up of her bilateral plantar fasciitis.  States that she is about 90% improved continues to wear her plantar fascial braces and take her meloxicam on a daily basis.  Objective: Vital signs are stable she is alert and oriented x3.  She has no reproducible pain medial calcaneal tubercle nor does she have pain on palpation of the fourth fifth tarsometatarsal joint or the Achilles.  Assessment: Well-healing Planter fasciitis.  Plan: Encouraged her to continue all current therapies until she is 100% pain-free last 1 month.  I would like to follow-up with her with any reoccurrence or regression.

## 2020-11-04 NOTE — Patient Instructions (Addendum)
DUE TO COVID-19 ONLY ONE VISITOR IS ALLOWED TO COME WITH YOU AND STAY IN THE WAITING ROOM ONLY DURING PRE OP AND PROCEDURE.   **NO VISITORS ARE ALLOWED IN THE SHORT STAY AREA OR RECOVERY ROOM!!**  IF YOU WILL BE ADMITTED INTO THE HOSPITAL YOU ARE ALLOWED ONLY TWO SUPPORT PEOPLE DURING VISITATION HOURS ONLY (10AM -8PM)   The support person(s) may change daily. The support person(s) must pass our screening, gel in and out, and wear a mask at all times, including in the patient's room. Patients must also wear a mask when staff or their support person are in the room.  No visitors under the age of 25. Any visitor under the age of 57 must be accompanied by an adult.    COVID SWAB TESTING MUST BE COMPLETED ON:  11/20/20 @ 8:00 AM   [address] [place], [place] [address]   You are not required to quarantine, however you are required to wear a well-fitted mask when you are out and around people not in your household.  Hand Hygiene often Do NOT share personal items Notify your provider if you are in close contact with someone who has COVID or you develop fever 100.4 or greater, new onset of sneezing, cough, sore throat, shortness of breath or body aches.   Your procedure is scheduled on: 11/21/20   Report to [facility] Main  Entrance    Report to admitting at 8:30 AM   Call this number if you have problems the morning of surgery [phone]   Do not eat food :After Midnight.   May have liquids until 7:30 AM day of surgery  CLEAR LIQUID DIET  Foods Allowed                                                                     Foods Excluded  Water, Black Coffee and tea, regular and decaf               liquids that you cannot  Plain Jell-O in any flavor  (No red)                                    see through such as: Fruit ices (not with fruit pulp)                                            milk, soups, orange juice              Iced Popsicles (No red)                                                 All solid food                                   Apple juices Sports drinks like Gatorade (No red) Lightly seasoned  clear broth or consume(fat free) Sugar, honey syrup    Oral Hygiene is also important to reduce your risk of infection.                                    Remember - BRUSH YOUR TEETH THE MORNING OF SURGERY WITH YOUR REGULAR TOOTHPASTE   Take these medicines the morning of surgery with A SIP OF WATER: Acetaminophen, Baclofen, Gabapentin, Pantoprazole, Inhalers.                               You may not have any metal on your body including hair pins, jewelry, and body piercing             Do not wear make-up, lotions, powders, perfumes, or deodorant  Do not wear nail polish including gel and S&S, artificial/acrylic nails, or any other type of covering on natural nails including finger and toenails. If you have artificial nails, gel coating, etc. that needs to be removed by a nail salon please have this removed prior to surgery or surgery may need to be canceled/ delayed if the surgeon/ anesthesia feels like they are unable to be safely monitored.   Do not shave  48 hours prior to surgery.    Do not bring valuables to the hospital. [place].   Bring small overnight bag day of surgery.  Special Instructions: Bring a copy of your healthcare power of attorney and living will documents         the day of surgery if you haven't scanned them in before.  Please read over the following fact sheets you were given: IF YOU HAVE QUESTIONS ABOUT YOUR PRE OP INSTRUCTIONS PLEASE CALL [phone]   [facility] - Preparing for Surgery Before surgery, you can play an important role.  Because skin is not sterile, your skin needs to be as free of germs as possible.  You can reduce the number of germs on your skin by washing with CHG (chlorahexidine gluconate) soap before surgery.  CHG is an antiseptic cleaner which kills  germs and bonds with the skin to continue killing germs even after washing. Please DO NOT use if you have an allergy to CHG or antibacterial soaps.  If your skin becomes reddened/irritated stop using the CHG and inform your nurse when you arrive at Short Stay. Do not shave (including legs and underarms) for at least 48 hours prior to the first CHG shower.  You may shave your face/neck.  Please follow these instructions carefully:  1.  Shower with CHG Soap the night before surgery and the  morning of surgery.  2.  If you choose to wash your hair, wash your hair first as usual with your normal  shampoo.  3.  After you shampoo, rinse your hair and body thoroughly to remove the shampoo.                             4.  Use CHG as you would any other liquid soap.  You can apply chg directly to the skin and wash.  Gently with a scrungie or clean washcloth.  5.  Apply the CHG Soap to your body ONLY FROM THE NECK  DOWN.   Do   not use on face/ open                           Wound or open sores. Avoid contact with eyes, ears mouth and   genitals (private parts).                       Wash face,  Genitals (private parts) with your normal soap.             6.  Wash thoroughly, paying special attention to the area where your    surgery  will be performed.  7.  Thoroughly rinse your body with warm water from the neck down.  8.  DO NOT shower/wash with your normal soap after using and rinsing off the CHG Soap.                9.  Pat yourself dry with a clean towel.            10.  Wear clean pajamas.            11.  Place clean sheets on your bed the night of your first shower and do not  sleep with pets. Day of Surgery : Do not apply any lotions/deodorants the morning of surgery.  Please wear clean clothes to the hospital/surgery center.  FAILURE TO FOLLOW THESE INSTRUCTIONS MAY RESULT IN THE CANCELLATION OF YOUR SURGERY  PATIENT SIGNATURE_________________________________  NURSE  SIGNATURE__________________________________  ________________________________________________________________________

## 2020-11-04 NOTE — Progress Notes (Addendum)
COVID Vaccine Completed: yes x3 Date COVID Vaccine completed: 04/28/19, 05/27/19 Has received booster:02/01/20 COVID vaccine manufacturer: Pfizer     Date of COVID positive in last 90 days: N/A  PCP - Lelon Frohlich, MD Cardiologist - N/A  Chest x-ray -  EKG - 12/29/19 on chart Stress Test - N/A ECHO - N/A Cardiac Cath - N/A Pacemaker/ICD device last checked: Spinal Cord Stimulator:  Sleep Study - N/A CPAP -   Fasting Blood Sugar - N/A Checks Blood Sugar _____ times a day  Blood Thinner Instructions: N/A Aspirin Instructions: Last Dose:  Activity level: Can go up a flight of stairs and perform activities of daily living without stopping and without symptoms of chest pain or shortness of breath.    Anesthesia review: palpitations  Patient denies shortness of breath, fever, cough and chest pain at PAT appointment   Patient verbalized understanding of instructions that were given to them at the PAT appointment. Patient was also instructed that they will need to review over the PAT instructions again at home before surgery.

## 2020-11-05 ENCOUNTER — Ambulatory Visit: Payer: 59 | Admitting: Endocrinology

## 2020-11-06 ENCOUNTER — Ambulatory Visit (HOSPITAL_COMMUNITY)
Admission: RE | Admit: 2020-11-06 | Discharge: 2020-11-06 | Disposition: A | Discharge status: Home / Self Care | Payer: 59 | Source: Ambulatory Visit | Attending: Anesthesiology | Admitting: Anesthesiology

## 2020-11-06 ENCOUNTER — Other Ambulatory Visit: Payer: Self-pay

## 2020-11-06 ENCOUNTER — Encounter (HOSPITAL_COMMUNITY)
Admission: RE | Admit: 2020-11-06 | Discharge: 2020-11-06 | Disposition: A | Discharge status: Home / Self Care | Payer: 59 | Source: Ambulatory Visit | Attending: Surgery | Admitting: Surgery

## 2020-11-06 ENCOUNTER — Encounter (HOSPITAL_COMMUNITY): Payer: Self-pay

## 2020-11-06 DIAGNOSIS — Z01818 Encounter for other preprocedural examination: Secondary | ICD-10-CM | POA: Insufficient documentation

## 2020-11-06 HISTORY — DX: Headache, unspecified: R51.9

## 2020-11-06 HISTORY — DX: Unspecified cataract: H26.9

## 2020-11-06 LAB — CBC
HCT: 42.5 % (ref 36.0–46.0)
Hemoglobin: 14 g/dL (ref 12.0–15.0)
MCH: 28.9 pg (ref 26.0–34.0)
MCHC: 32.9 g/dL (ref 30.0–36.0)
MCV: 87.8 fL (ref 80.0–100.0)
Platelets: 331 10*3/uL (ref 150–400)
RBC: 4.84 MIL/uL (ref 3.87–5.11)
RDW: 13.2 % (ref 11.5–15.5)
WBC: 7.1 10*3/uL (ref 4.0–10.5)
nRBC: 0 % (ref 0.0–0.2)

## 2020-11-06 NOTE — Progress Notes (Addendum)
Patient has not arrived to appointment. Called x2 and left voicemail with call back number. Will attempt to call again later.  Patient arrived at 0900. She thought that was the scheduled time. Was able to complete PAT appointment.

## 2020-11-18 ENCOUNTER — Encounter (HOSPITAL_COMMUNITY): Payer: Self-pay | Admitting: Surgery

## 2020-11-18 NOTE — H&P (Signed)
General Surgery [facility]  Kendra Gonzalez DOB: 30-Aug-1970 Married / Language: English / Race: Black or African American Female   History of Present Illness  The patient is a 50 year old female who presents with hyperthyroidism.  CHIEF COMPLAINT: toxic multinodular goiter  Patient is referred by Dr. Renato Shin for surgical evaluation and management of toxic multinodular goiter.  Patient has had a history of thyroid disease dating back 7-8 years.  She has multiple nodules.  Her TSH level is suppressed at less than 0.1.  She is on methimazole 10 mg twice daily.  She does not wish to proceed with radioactive iodine treatment.  Patient has undergone prior ultrasound examinations as well as fine-needle aspiration biopsies with benign results.  Her most recent ultrasound is from August 09, 2020.  This shows a right thyroid lobe measuring 5.3 cm with several small nodules.  The left thyroid lobe measures 6.1 cm and contains 2 dominant nodules measuring 2.2 cm and 2.9 cm.  Patient complains of tremor.  She complains of hot flashes.  She complains of fatigue.  She complains of globus sensation. She has had no prior head or neck surgery.  There is no family history of thyroid disease or thyroid malignancy.  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   Past Surgical History  Cataract Surgery   Bilateral. Gallbladder Surgery - Laparoscopic   Hip Surgery   Bilateral. Hysterectomy (not due to cancer) - Complete   Knee Surgery   Left. Oral Surgery    Diagnostic Studies History  Colonoscopy   1-5 years ago Mammogram   within last year Pap Smear   >5 years ago  Allergies  Allergies Reconciled    Medication History  Medications Reconciled  Meloxicam  ('15MG'$  Tablet, Oral) Active. predniSONE  ('10MG'$  Tablet, Oral) Active. Neomycin-Polymyxin-Dexameth  (3.5-10000-0.1 Ointment, Ophthalmic) Active. Baclofen  ('10MG'$  Tablet, Oral) Active. Diclofenac Sodium   ('75MG'$  Tablet DR, Oral) Active. methIMAzole  ('10MG'$  Tablet, Oral) Active. tiZANidine HCl  ('2MG'$  Tablet, Oral) Active. Omeprazole  ('40MG'$  Capsule DR, Oral) Active. Rizatriptan Benzoate  ('5MG'$  Tablet, Oral) Active. Albuterol Sulfate HFA  (108 (90 Base)MCG/ACT Aerosol Soln, Inhalation) Active. Sodium & Potassium Bicarbonate  (Oral) Active. Cyanocobalamin  (500MCG Tablet Disint, Oral) Active.  Social History  Caffeine use   Tea. No alcohol use   No drug use   Tobacco use   Never smoker.  Family History  Cervical Cancer   Mother. Heart disease in female family member before age 51   Hypertension   Brother, Father, Mother, Sister.  Pregnancy / Birth History  Age at menarche   13 years. Gravida   4 Maternal age   72-20 Para   2  Other Problems Arthritis   Asthma   Gastric Ulcer   Gastroesophageal Reflux Disease   Kidney Stone   Migraine Headache   Oophorectomy   Right. Pancreatitis   Thyroid Disease    Review of Systems  General Present- Night Sweats. Not Present- Appetite Loss, Chills, Fatigue, Fever, Weight Gain and Weight Loss. Skin Not Present- Change in Wart/Mole, Dryness, Hives, Jaundice, New Lesions, Non-Healing Wounds, Rash and Ulcer. HEENT Present- Seasonal Allergies. Not Present- Earache, Hearing Loss, Hoarseness, Nose Bleed, Oral Ulcers, Ringing in the Ears, Sinus Pain, Sore Throat, Visual Disturbances, Wears glasses/contact lenses and Yellow Eyes. Respiratory Not Present- Bloody sputum, Chronic Cough, Difficulty Breathing, Snoring and Wheezing. Breast Not Present- Breast Mass, Breast Pain, Nipple Discharge and Skin Changes. Cardiovascular Not Present- Chest  Pain, Difficulty Breathing Lying Down, Leg Cramps, Palpitations, Rapid Heart Rate, Shortness of Breath and Swelling of Extremities. Gastrointestinal Not Present- Abdominal Pain, Bloating, Bloody Stool, Change in Bowel Habits, Chronic diarrhea, Constipation, Difficulty Swallowing, Excessive gas, Gets full quickly at  meals, Hemorrhoids, Indigestion, Nausea, Rectal Pain and Vomiting. Female Genitourinary Not Present- Frequency, Nocturia, Painful Urination, Pelvic Pain and Urgency. Musculoskeletal Present- Joint Pain. Not Present- Back Pain, Joint Stiffness, Muscle Pain, Muscle Weakness and Swelling of Extremities. Neurological Present- Tremor. Not Present- Decreased Memory, Fainting, Headaches, Numbness, Seizures, Tingling, Trouble walking and Weakness. Psychiatric Not Present- Anxiety, Bipolar, Change in Sleep Pattern, Depression, Fearful and Frequent crying. Endocrine Present- Hot flashes. Not Present- Cold Intolerance, Excessive Hunger, Hair Changes, Heat Intolerance and New Diabetes. Hematology Not Present- Blood Thinners, Easy Bruising, Excessive bleeding, Gland problems, HIV and Persistent Infections.  Vitals  Weight: 198 lb   Height: 64 in  Body Surface Area: 1.95 m   Body Mass Index: 33.99 kg/m   Temp.: 98.3 F    Pulse: 118 (Regular)    P.OX: 100% (Room air) BP: 120/80(Sitting, Left Arm, Standard)  Physical Exam   GENERAL APPEARANCE Development: normal Nutritional status: normal Gross deformities: none  SKIN Rash, lesions, ulcers: none Induration, erythema: none Nodules: none palpable  EYES Conjunctiva and lids: normal Pupils: equal and reactive Iris: normal bilaterally  EARS, NOSE, MOUTH, THROAT External ears: no lesion or deformity External nose: no lesion or deformity Hearing: grossly normal Due to Covid-19 pandemic, patient is wearing a mask.  NECK Symmetric: yes Trachea: midline Thyroid: Right thyroid lobe is nodular to palpation and relatively firm. It is nontender. Palpation of the left lobe shows larger approximately 2 cm nodules which is firm and mobile. There is no tenderness. There is no associated lymphadenopathy.  CHEST Respiratory effort: normal Retraction or accessory muscle use: no Breath sounds: normal bilaterally Rales, rhonchi, wheeze:  none  CARDIOVASCULAR Auscultation: regular rhythm, normal rate Murmurs: none Pulses: radial pulse 2+ palpable Lower extremity edema: none  MUSCULOSKELETAL Station and gait: normal Digits and nails: no clubbing or cyanosis Muscle strength: grossly normal all extremities Range of motion: grossly normal all extremities Deformity: none  LYMPHATIC Cervical: none palpable Supraclavicular: none palpable  PSYCHIATRIC Oriented to person, place, and time: yes Mood and affect: normal for situation Judgment and insight: appropriate for situation    Assessment & Plan   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   Patient provided with a copy of "The Thyroid Book: Medical and Surgical Treatment of Thyroid Problems", published by [employer], 16 pages.  Book reviewed and explained to patient during visit today.  Patient has a mildly enlarged multinodular thyroid gland with hyperthyroidism.  The suspected a problem for several years.  Fine-needle aspiration biopsies have been benign.  Patient has been poorly controlled on methimazole.  Patient does not wish to undergo radioactive iodine treatment.  She would like to proceed with thyroidectomy for treatment.  We discussed the procedure of total thyroidectomy.  Discussed risk and benefits of the procedure.  We discussed the risk of recurrent laryngeal nerve injury and injury to parathyroid glands.  We discussed the size and location of the surgical incision.  We discussed the hospital stay to be anticipated in the postoperative recovery and return to work.  We discussed the need for lifelong thyroid hormone replacement.  The patient understands and wishes to proceed with surgery in the near future.  The  risks and benefits of the procedure have been discussed at length with the patient.  The patient understands the proposed procedure,  potential alternative treatments, and the course of recovery to be expected.  All of the patient's questions have been answered at this time.  The patient wishes to proceed with surgery.  Armandina Gemma, MD [facility] Office: [phone]

## 2020-11-20 ENCOUNTER — Other Ambulatory Visit (HOSPITAL_COMMUNITY)
Admission: RE | Admit: 2020-11-20 | Discharge: 2020-11-20 | Disposition: A | Discharge status: Home / Self Care | Payer: 59 | Source: Ambulatory Visit | Attending: Surgery | Admitting: Surgery

## 2020-11-20 DIAGNOSIS — Z20822 Contact with and (suspected) exposure to covid-19: Secondary | ICD-10-CM | POA: Insufficient documentation

## 2020-11-20 DIAGNOSIS — Z01812 Encounter for preprocedural laboratory examination: Secondary | ICD-10-CM | POA: Diagnosis present

## 2020-11-20 LAB — SARS CORONAVIRUS 2 (TAT 6-24 HRS): SARS Coronavirus 2: NEGATIVE

## 2020-11-20 NOTE — Anesthesia Preprocedure Evaluation (Addendum)
Anesthesia Evaluation  Patient identified by MRN, date of birth, ID band Patient awake    Reviewed: Allergy & Precautions, NPO status , Patient's Chart, lab work & pertinent test results  Airway Mallampati: II  TM Distance: >3 FB Neck ROM: Full    Dental no notable dental hx. (+) Teeth Intact, Dental Advisory Given   Pulmonary asthma ,    Pulmonary exam normal breath sounds clear to auscultation       Cardiovascular Normal cardiovascular exam Rhythm:Regular Rate:Normal     Neuro/Psych  Headaches,    GI/Hepatic GERD  ,  Endo/Other  Hyperthyroidism   Renal/GU Renal disease     Musculoskeletal   Abdominal (+) + obese (BMI 34.07),   Peds  Hematology Lab Results      Component                Value               Date                      WBC                      7.1                 11/06/2020                HGB                      14.0                11/06/2020                HCT                      42.5                11/06/2020                MCV                      87.8                11/06/2020                PLT                      331                 11/06/2020              Anesthesia Other Findings All: ? Hydromorphone  Reproductive/Obstetrics                            Anesthesia Physical Anesthesia Plan  ASA: 2  Anesthesia Plan: General   Post-op Pain Management:    Induction: Intravenous  PONV Risk Score and Plan: 4 or greater and Treatment may vary due to age or medical condition, Ondansetron, Dexamethasone and Midazolam  Airway Management Planned: Oral ETT and Video Laryngoscope Planned  Additional Equipment: None  Intra-op Plan:   Post-operative Plan: Extubation in OR  Informed Consent: I have reviewed the patients History and Physical, chart, labs and discussed the procedure including the risks, benefits and alternatives for the proposed anesthesia with the patient  or authorized representative who has indicated his/her understanding and  acceptance.     Dental advisory given  Plan Discussed with: CRNA  Anesthesia Plan Comments: (GA ETT)       Anesthesia Quick Evaluation

## 2020-11-21 ENCOUNTER — Ambulatory Visit (HOSPITAL_COMMUNITY)
Admission: RE | Admit: 2020-11-21 | Discharge: 2020-11-22 | Disposition: A | Discharge status: Home / Self Care | Payer: 59 | Source: Ambulatory Visit | Attending: Surgery | Admitting: Surgery

## 2020-11-21 ENCOUNTER — Encounter (HOSPITAL_COMMUNITY)
Admission: RE | Disposition: A | Discharge status: Home / Self Care | Payer: Self-pay | Source: Ambulatory Visit | Attending: Surgery

## 2020-11-21 ENCOUNTER — Ambulatory Visit (HOSPITAL_COMMUNITY): Payer: 59 | Admitting: Anesthesiology

## 2020-11-21 ENCOUNTER — Other Ambulatory Visit: Payer: Self-pay

## 2020-11-21 ENCOUNTER — Encounter (HOSPITAL_COMMUNITY): Payer: Self-pay | Admitting: Surgery

## 2020-11-21 DIAGNOSIS — Z7952 Long term (current) use of systemic steroids: Secondary | ICD-10-CM | POA: Diagnosis not present

## 2020-11-21 DIAGNOSIS — E0590 Thyrotoxicosis, unspecified without thyrotoxic crisis or storm: Secondary | ICD-10-CM | POA: Diagnosis present

## 2020-11-21 DIAGNOSIS — Z79899 Other long term (current) drug therapy: Secondary | ICD-10-CM | POA: Insufficient documentation

## 2020-11-21 DIAGNOSIS — Z791 Long term (current) use of non-steroidal anti-inflammatories (NSAID): Secondary | ICD-10-CM | POA: Diagnosis not present

## 2020-11-21 DIAGNOSIS — E0520 Thyrotoxicosis with toxic multinodular goiter without thyrotoxic crisis or storm: Secondary | ICD-10-CM | POA: Diagnosis present

## 2020-11-21 DIAGNOSIS — E042 Nontoxic multinodular goiter: Secondary | ICD-10-CM

## 2020-11-21 DIAGNOSIS — F458 Other somatoform disorders: Secondary | ICD-10-CM | POA: Insufficient documentation

## 2020-11-21 HISTORY — PX: THYROIDECTOMY: SHX17

## 2020-11-21 SURGERY — THYROIDECTOMY
Anesthesia: General | Site: Neck

## 2020-11-21 MED ORDER — PANTOPRAZOLE SODIUM 40 MG PO TBEC
40.0000 mg | DELAYED_RELEASE_TABLET | Freq: Every day | ORAL | Status: DC
  Administered 2020-11-21 – 2020-11-22 (×2): 40 mg via ORAL
  Filled 2020-11-21 (×2): qty 1

## 2020-11-21 MED ORDER — PROPOFOL 10 MG/ML IV BOLUS
INTRAVENOUS | Status: AC
  Filled 2020-11-21: qty 20

## 2020-11-21 MED ORDER — MIDAZOLAM HCL 5 MG/5ML IJ SOLN
INTRAMUSCULAR | Status: DC | PRN
Start: 2020-11-21 — End: 2020-11-21
  Administered 2020-11-21: 2 mg via INTRAVENOUS

## 2020-11-21 MED ORDER — SODIUM CHLORIDE 0.45 % IV SOLN: INTRAVENOUS | Status: DC

## 2020-11-21 MED ORDER — PROPOFOL 10 MG/ML IV BOLUS
INTRAVENOUS | Status: DC | PRN
  Administered 2020-11-21: 150 mg via INTRAVENOUS

## 2020-11-21 MED ORDER — ONDANSETRON 4 MG PO TBDP
4.0000 mg | ORAL_TABLET | Freq: Four times a day (QID) | ORAL | Status: DC | PRN
  Administered 2020-11-21: 4 mg via ORAL
  Filled 2020-11-21: qty 1

## 2020-11-21 MED ORDER — ESMOLOL HCL 100 MG/10ML IV SOLN
INTRAVENOUS | Status: AC
  Filled 2020-11-21: qty 10

## 2020-11-21 MED ORDER — HEMOSTATIC AGENTS (NO CHARGE) OPTIME
TOPICAL | Status: DC | PRN
  Administered 2020-11-21: 1 via TOPICAL

## 2020-11-21 MED ORDER — ZONISAMIDE 100 MG PO CAPS
100.0000 mg | ORAL_CAPSULE | Freq: Every day | ORAL | Status: DC
  Administered 2020-11-21: 100 mg via ORAL
  Filled 2020-11-21: qty 1

## 2020-11-21 MED ORDER — ROCURONIUM BROMIDE 10 MG/ML (PF) SYRINGE
PREFILLED_SYRINGE | INTRAVENOUS | Status: AC
  Filled 2020-11-21: qty 10

## 2020-11-21 MED ORDER — CHLORHEXIDINE GLUCONATE CLOTH 2 % EX PADS: 6.0000 | MEDICATED_PAD | Freq: Once | CUTANEOUS | Status: DC

## 2020-11-21 MED ORDER — LIDOCAINE 2% (20 MG/ML) 5 ML SYRINGE
INTRAMUSCULAR | Status: AC
  Filled 2020-11-21: qty 5

## 2020-11-21 MED ORDER — SODIUM CHLORIDE 0.9 % IV SOLN
2.0000 g | INTRAVENOUS | Status: AC
  Administered 2020-11-21: 2 g via INTRAVENOUS
  Filled 2020-11-21: qty 2

## 2020-11-21 MED ORDER — LIDOCAINE 2% (20 MG/ML) 5 ML SYRINGE
INTRAMUSCULAR | Status: DC | PRN
Start: 2020-11-21 — End: 2020-11-21
  Administered 2020-11-21: 100 mg via INTRAVENOUS

## 2020-11-21 MED ORDER — ACETAMINOPHEN 325 MG PO TABS
650.0000 mg | ORAL_TABLET | Freq: Four times a day (QID) | ORAL | Status: DC | PRN
  Administered 2020-11-21: 650 mg via ORAL
  Filled 2020-11-21: qty 2

## 2020-11-21 MED ORDER — TRAMADOL HCL 50 MG PO TABS
50.0000 mg | ORAL_TABLET | Freq: Four times a day (QID) | ORAL | Status: DC | PRN
  Administered 2020-11-21: 50 mg via ORAL
  Filled 2020-11-21: qty 1

## 2020-11-21 MED ORDER — OXYCODONE HCL 5 MG/5ML PO SOLN: 5.0000 mg | Freq: Once | ORAL | Status: DC | PRN

## 2020-11-21 MED ORDER — ACETAMINOPHEN 650 MG RE SUPP: 650.0000 mg | Freq: Four times a day (QID) | RECTAL | Status: DC | PRN

## 2020-11-21 MED ORDER — ONDANSETRON HCL 4 MG/2ML IJ SOLN: 4.0000 mg | Freq: Once | INTRAMUSCULAR | Status: DC | PRN

## 2020-11-21 MED ORDER — AMISULPRIDE (ANTIEMETIC) 5 MG/2ML IV SOLN: 10.0000 mg | Freq: Once | INTRAVENOUS | Status: DC | PRN

## 2020-11-21 MED ORDER — DEXAMETHASONE SODIUM PHOSPHATE 10 MG/ML IJ SOLN
INTRAMUSCULAR | Status: DC | PRN
  Administered 2020-11-21: 10 mg via INTRAVENOUS

## 2020-11-21 MED ORDER — OXYCODONE HCL 5 MG PO TABS
5.0000 mg | ORAL_TABLET | ORAL | Status: DC | PRN
  Administered 2020-11-21 – 2020-11-22 (×3): 5 mg via ORAL
  Filled 2020-11-21 (×3): qty 1

## 2020-11-21 MED ORDER — ORAL CARE MOUTH RINSE: 15.0000 mL | Freq: Once | OROMUCOSAL | Status: AC

## 2020-11-21 MED ORDER — ROCURONIUM BROMIDE 10 MG/ML (PF) SYRINGE
PREFILLED_SYRINGE | INTRAVENOUS | Status: DC | PRN
  Administered 2020-11-21: 60 mg via INTRAVENOUS
  Administered 2020-11-21: 10 mg via INTRAVENOUS

## 2020-11-21 MED ORDER — CALCIUM CARBONATE 1250 (500 CA) MG PO TABS
2.0000 | ORAL_TABLET | Freq: Three times a day (TID) | ORAL | Status: DC
  Administered 2020-11-21 – 2020-11-22 (×2): 1000 mg via ORAL
  Filled 2020-11-21 (×2): qty 1

## 2020-11-21 MED ORDER — FENTANYL CITRATE (PF) 250 MCG/5ML IJ SOLN
INTRAMUSCULAR | Status: AC
  Filled 2020-11-21: qty 5

## 2020-11-21 MED ORDER — OXYCODONE HCL 5 MG PO TABS: 5.0000 mg | ORAL_TABLET | Freq: Once | ORAL | Status: DC | PRN

## 2020-11-21 MED ORDER — DEXAMETHASONE SODIUM PHOSPHATE 10 MG/ML IJ SOLN
INTRAMUSCULAR | Status: AC
  Filled 2020-11-21: qty 1

## 2020-11-21 MED ORDER — 0.9 % SODIUM CHLORIDE (POUR BTL) OPTIME
TOPICAL | Status: DC | PRN
  Administered 2020-11-21: 1000 mL

## 2020-11-21 MED ORDER — ONDANSETRON HCL 4 MG/2ML IJ SOLN
INTRAMUSCULAR | Status: AC
  Filled 2020-11-21: qty 2

## 2020-11-21 MED ORDER — ACETAMINOPHEN 10 MG/ML IV SOLN
1000.0000 mg | Freq: Once | INTRAVENOUS | Status: DC | PRN
  Administered 2020-11-21: 1000 mg via INTRAVENOUS

## 2020-11-21 MED ORDER — CHLORHEXIDINE GLUCONATE 0.12 % MT SOLN
15.0000 mL | Freq: Once | OROMUCOSAL | Status: AC
  Administered 2020-11-21: 15 mL via OROMUCOSAL

## 2020-11-21 MED ORDER — FENTANYL CITRATE (PF) 100 MCG/2ML IJ SOLN
INTRAMUSCULAR | Status: AC
  Filled 2020-11-21: qty 2

## 2020-11-21 MED ORDER — GABAPENTIN 300 MG PO CAPS
300.0000 mg | ORAL_CAPSULE | Freq: Three times a day (TID) | ORAL | Status: DC
  Administered 2020-11-21 – 2020-11-22 (×3): 300 mg via ORAL
  Filled 2020-11-21 (×3): qty 1

## 2020-11-21 MED ORDER — FENTANYL CITRATE (PF) 100 MCG/2ML IJ SOLN
INTRAMUSCULAR | Status: DC | PRN
  Administered 2020-11-21 (×4): 50 ug via INTRAVENOUS
  Administered 2020-11-21: 100 ug via INTRAVENOUS
  Administered 2020-11-21: 50 ug via INTRAVENOUS

## 2020-11-21 MED ORDER — ACETAMINOPHEN 10 MG/ML IV SOLN
INTRAVENOUS | Status: AC
  Filled 2020-11-21: qty 100

## 2020-11-21 MED ORDER — ONDANSETRON HCL 4 MG/2ML IJ SOLN
4.0000 mg | Freq: Four times a day (QID) | INTRAMUSCULAR | Status: DC | PRN
  Administered 2020-11-21: 4 mg via INTRAVENOUS
  Filled 2020-11-21: qty 2

## 2020-11-21 MED ORDER — MIDAZOLAM HCL 2 MG/2ML IJ SOLN
INTRAMUSCULAR | Status: AC
  Filled 2020-11-21: qty 2

## 2020-11-21 MED ORDER — FENTANYL CITRATE (PF) 100 MCG/2ML IJ SOLN
25.0000 ug | INTRAMUSCULAR | Status: DC | PRN
  Administered 2020-11-21 (×2): 50 ug via INTRAVENOUS

## 2020-11-21 MED ORDER — LACTATED RINGERS IV SOLN: INTRAVENOUS | Status: DC

## 2020-11-21 MED ORDER — ONDANSETRON HCL 4 MG/2ML IJ SOLN
INTRAMUSCULAR | Status: DC | PRN
  Administered 2020-11-21: 4 mg via INTRAVENOUS

## 2020-11-21 MED ORDER — SUGAMMADEX SODIUM 200 MG/2ML IV SOLN
INTRAVENOUS | Status: DC | PRN
  Administered 2020-11-21: 200 mg via INTRAVENOUS

## 2020-11-21 SURGICAL SUPPLY — 31 items
ATTRACTOMAT 16X20 MAGNETIC DRP (DRAPES) ×2 IMPLANT
BAG COUNTER SPONGE SURGICOUNT (BAG) ×2 IMPLANT
BLADE SURG 15 STRL LF DISP TIS (BLADE) ×1 IMPLANT
BLADE SURG 15 STRL SS (BLADE) ×2
CHLORAPREP W/TINT 26 (MISCELLANEOUS) ×2 IMPLANT
CLIP TI MEDIUM 6 (CLIP) ×12 IMPLANT
CLIP TI WIDE RED SMALL 6 (CLIP) ×10 IMPLANT
CLIP VESOCCLUDE SM WIDE 6/CT (CLIP) ×2 IMPLANT
COVER SURGICAL LIGHT HANDLE (MISCELLANEOUS) ×2 IMPLANT
DERMABOND ADVANCED (GAUZE/BANDAGES/DRESSINGS) ×1
DERMABOND ADVANCED .7 DNX12 (GAUZE/BANDAGES/DRESSINGS) ×1 IMPLANT
DRAPE LAPAROTOMY T 98X78 PEDS (DRAPES) ×2 IMPLANT
DRAPE UTILITY XL STRL (DRAPES) ×2 IMPLANT
ELECT PENCIL ROCKER SW 15FT (MISCELLANEOUS) ×2 IMPLANT
ELECT REM PT RETURN 15FT ADLT (MISCELLANEOUS) ×2 IMPLANT
GAUZE 4X4 16PLY ~~LOC~~+RFID DBL (SPONGE) ×2 IMPLANT
GLOVE SURG SYN 7.5  E (GLOVE) ×4
GLOVE SURG SYN 7.5 E (GLOVE) ×2 IMPLANT
GOWN STRL REUS W/TWL XL LVL3 (GOWN DISPOSABLE) ×4 IMPLANT
HEMOSTAT SURGICEL 2X4 FIBR (HEMOSTASIS) ×2 IMPLANT
ILLUMINATOR WAVEGUIDE N/F (MISCELLANEOUS) ×2 IMPLANT
KIT BASIN OR (CUSTOM PROCEDURE TRAY) ×2 IMPLANT
KIT TURNOVER KIT A (KITS) ×2 IMPLANT
PACK BASIC VI WITH GOWN DISP (CUSTOM PROCEDURE TRAY) ×2 IMPLANT
SHEARS HARMONIC 9CM CVD (BLADE) ×2 IMPLANT
SUT MNCRL AB 4-0 PS2 18 (SUTURE) ×2 IMPLANT
SUT VIC AB 3-0 SH 18 (SUTURE) ×4 IMPLANT
SYR BULB IRRIG 60ML STRL (SYRINGE) ×2 IMPLANT
TOWEL OR 17X26 10 PK STRL BLUE (TOWEL DISPOSABLE) ×2 IMPLANT
TOWEL OR NON WOVEN STRL DISP B (DISPOSABLE) ×2 IMPLANT
TUBING CONNECTING 10 (TUBING) ×2 IMPLANT

## 2020-11-21 NOTE — Anesthesia Postprocedure Evaluation (Signed)
Anesthesia Post Note  Patient: Kendra Gonzalez  Procedure(s) Performed: TOTAL THYROIDECTOMY (Neck)     Patient location during evaluation: PACU Anesthesia Type: General Level of consciousness: awake and alert Pain management: pain level controlled Vital Signs Assessment: post-procedure vital signs reviewed and stable Respiratory status: spontaneous breathing, nonlabored ventilation, respiratory function stable and patient connected to nasal cannula oxygen Cardiovascular status: blood pressure returned to baseline and stable Postop Assessment: no apparent nausea or vomiting Anesthetic complications: no   No notable events documented.  Last Vitals:  Vitals:   11/21/20 1345 11/21/20 1420  BP: (!) 164/94 (!) 174/95  Pulse: 92 89  Resp: (!) 22 15  Temp:  (!) 36.4 C  SpO2: 99% 98%    Last Pain:  Vitals:   11/21/20 1420  TempSrc: Oral  PainSc: 10-Worst pain ever                 Barnet Glasgow

## 2020-11-21 NOTE — Interval H&P Note (Signed)
History and Physical Interval Note:  11/21/2020 10:30 AM  [place]  has presented today for surgery, with the diagnosis of TOXIC MULTINODULAR GOITER.  The various methods of treatment have been discussed with the patient and family. After consideration of risks, benefits and other options for treatment, the patient has consented to    Procedure(s): TOTAL THYROIDECTOMY (N/A) as a surgical intervention.    The patient's history has been reviewed, patient examined, no change in status, stable for surgery.  I have reviewed the patient's chart and labs.  Questions were answered to the patient's satisfaction.    Armandina Gemma, MD [facility] Office: [place]

## 2020-11-21 NOTE — Anesthesia Procedure Notes (Signed)
Procedure Name: Intubation Date/Time: 11/21/2020 11:04 AM Performed by: Maxwell Caul, CRNA Pre-anesthesia Checklist: Patient identified, Emergency Drugs available, Suction available and Patient being monitored Patient Re-evaluated:Patient Re-evaluated prior to induction Oxygen Delivery Method: Circle system utilized Preoxygenation: Pre-oxygenation with 100% oxygen Induction Type: IV induction Ventilation: Mask ventilation without difficulty Laryngoscope Size: Mac and 4 Grade View: Grade I Tube type: Oral Tube size: 7.5 mm Number of attempts: 1 Airway Equipment and Method: Stylet Placement Confirmation: ETT inserted through vocal cords under direct vision, positive ETCO2 and breath sounds checked- equal and bilateral Secured at: 21 cm Tube secured with: Tape Dental Injury: Teeth and Oropharynx as per pre-operative assessment

## 2020-11-21 NOTE — Op Note (Signed)
Procedure Note  Pre-operative Diagnosis:  toxic multinodular thyroid goiter  Post-operative Diagnosis:  same  Surgeon:  Armandina Gemma, MD  Assistant:  none   Procedure:  Total thyroidectomy  Anesthesia:  General  Estimated Blood Loss:  minimal  Drains: none         Specimen: thyroid to pathology  Indications:  Patient is referred by Dr. Renato Shin for surgical evaluation and management of toxic multinodular goiter.  Patient has had a history of thyroid disease dating back 7-8 years.  She has multiple nodules.  Her TSH level is suppressed at less than 0.1.  She is on methimazole 10 mg twice daily.  She does not wish to proceed with radioactive iodine treatment.  Patient has undergone prior ultrasound examinations as well as fine-needle aspiration biopsies with benign results.  Her most recent ultrasound is from August 09, 2020.  This shows a right thyroid lobe measuring 5.3 cm with several small nodules.  The left thyroid lobe measures 6.1 cm and contains 2 dominant nodules measuring 2.2 cm and 2.9 cm.  Patient complains of tremor.  She complains of hot flashes.  She complains of fatigue.  She complains of globus sensation. She has had no prior head or neck surgery.  There is no family history of thyroid disease or thyroid malignancy.  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  Procedure Details: Procedure was done in [place] [address]1 at the [facility]. The patient was brought to the operating room and placed in a supine position on the operating room table. Following administration of general anesthesia, the patient was positioned and then prepped and draped in the usual aseptic fashion. After ascertaining that an adequate level of anesthesia had been achieved, a small Kocher incision was made with #15 blade. Dissection was carried through subcutaneous tissues and platysma.Hemostasis was achieved with the electrocautery. Skin flaps were elevated cephalad and caudad from  the thyroid notch to the sternal notch. A Mahorner self-retaining retractor was placed for exposure. Strap muscles were incised in the midline and dissection was begun on the left side.  Strap muscles were reflected laterally.  Left thyroid lobe was mildly enlarged with several nodules.  The left lobe was gently mobilized with blunt dissection. Superior pole vessels were dissected out and divided individually between small and medium ligaclips with the harmonic scalpel. The thyroid lobe was rolled anteriorly. Branches of the inferior thyroid artery were divided between small ligaclips with the harmonic scalpel. Inferior venous tributaries were divided between ligaclips. Both the superior and inferior parathyroid glands were identified and preserved on their vascular pedicles. The recurrent laryngeal nerve was identified and preserved along its course. The ligament of Gwenlyn Found was released with the electrocautery and the gland was mobilized onto the anterior trachea. Isthmus was mobilized across the midline. There was a small pyramidal lobe present which was resected with the thyroid isthmus. Dry pack was placed in the left neck.  The right thyroid lobe was gently mobilized with blunt dissection. Right thyroid lobe was normal in size with small nodules. Superior pole vessels were dissected out and divided between small and medium ligaclips with the Harmonic scalpel. Superior parathyroid was identified and preserved. Inferior venous tributaries were divided between medium ligaclips with the harmonic scalpel. The right thyroid lobe was rolled anteriorly and the branches of the inferior thyroid artery divided between small ligaclips. The right recurrent laryngeal nerve was identified and preserved along its course. The ligament of Gwenlyn Found was released with the electrocautery.  The right thyroid lobe was mobilized onto the anterior trachea and the remainder of the thyroid was dissected off the anterior trachea and the  thyroid was completely excised. A suture was used to mark the left lobe. The entire thyroid gland was submitted to pathology for review.  The neck was irrigated with warm saline. Fibrillar was placed throughout the operative field. Strap muscles were approximated in the midline with interrupted 3-0 Vicryl sutures. Platysma was closed with interrupted 3-0 Vicryl sutures. Skin was closed with a running 4-0 Monocryl subcuticular suture. Wound was washed and Dermabond was applied. The patient was awakened from anesthesia and brought to the recovery room. The patient tolerated the procedure well.   Armandina Gemma, MD [facility] Office: [phone]

## 2020-11-21 NOTE — Transfer of Care (Signed)
Immediate Anesthesia Transfer of Care Note  Patient: Galen Manila Levitan  Procedure(s) Performed: TOTAL THYROIDECTOMY (Neck)  Patient Location: PACU  Anesthesia Type:General  Level of Consciousness: awake, alert  and oriented  Airway & Oxygen Therapy: Patient Spontanous Breathing and Patient connected to face mask oxygen  Post-op Assessment: Report given to RN and Post -op Vital signs reviewed and stable  Post vital signs: Reviewed and stable  Last Vitals:  Vitals Value Taken Time  BP 189/96 11/21/20 1300  Temp    Pulse 92 11/21/20 1302  Resp 24 11/21/20 1302  SpO2 100 % 11/21/20 1302  Vitals shown include unvalidated device data.  Last Pain:  Vitals:   11/21/20 0909  TempSrc: Oral  PainSc:          Complications: No notable events documented.

## 2020-11-22 ENCOUNTER — Encounter (HOSPITAL_COMMUNITY): Payer: Self-pay | Admitting: Surgery

## 2020-11-22 DIAGNOSIS — E0520 Thyrotoxicosis with toxic multinodular goiter without thyrotoxic crisis or storm: Secondary | ICD-10-CM | POA: Diagnosis not present

## 2020-11-22 LAB — BASIC METABOLIC PANEL
Anion gap: 9 (ref 5–15)
BUN: 10 mg/dL (ref 6–20)
CO2: 26 mmol/L (ref 22–32)
Calcium: 10.7 mg/dL — ABNORMAL HIGH (ref 8.9–10.3)
Chloride: 102 mmol/L (ref 98–111)
Creatinine, Ser: 0.84 mg/dL (ref 0.44–1.00)
GFR, Estimated: >60 mL/min (ref >60)
Glucose, Bld: 119 mg/dL — ABNORMAL HIGH (ref 70–99)
Potassium: 4.1 mmol/L (ref 3.5–5.1)
Sodium: 137 mmol/L (ref 135–145)

## 2020-11-22 LAB — SURGICAL PATHOLOGY

## 2020-11-22 MED ORDER — PHENOL 1.4 % MT LIQD
1.0000 | OROMUCOSAL | Status: DC | PRN
  Filled 2020-11-22: qty 177

## 2020-11-22 MED ORDER — LEVOTHYROXINE SODIUM 100 MCG PO TABS: 100.0000 ug | ORAL_TABLET | Freq: Every day | ORAL | 2 refills | Status: DC

## 2020-11-22 MED ORDER — CALCIUM CARBONATE ANTACID 500 MG PO CHEW: 2.0000 | CHEWABLE_TABLET | Freq: Two times a day (BID) | ORAL | 1 refills | Status: DC

## 2020-11-22 NOTE — Discharge Instructions (Signed)
[facility]  THYROID & PARATHYROID SURGERY:  POST-OP INSTRUCTIONS  Always review your discharge instruction sheet from the facility where your surgery was performed.  A prescription for pain medication may be given to you upon discharge.  Take your pain medication as prescribed.  If narcotic pain medicine is not needed, then you may take acetaminophen (Tylenol) or ibuprofen (Advil) as needed.  Take your usually prescribed medications unless otherwise directed.  If you need a refill on your pain medication, please contact our office during regular business hours.  Prescriptions cannot be processed by our office after 5 pm or on weekends.  Start with a light diet upon arrival home, such as soup and crackers or toast.  Be sure to drink plenty of fluids daily.  Resume your normal diet the day after surgery.  Most patients will experience some swelling and bruising on the chest and neck area.  Ice packs will help.  Swelling and bruising can take several days to resolve.   It is common to experience some constipation after surgery.  Increasing fluid intake and taking a stool softener (Colace) will usually help or prevent this problem.  A mild laxative (Milk of Magnesia or Miralax) should be taken according to package directions if there has been no bowel movement after 48 hours.  You have steri-strips and a gauze dressing over your incision.  You may remove the gauze bandage on the second day after surgery, and you may shower at that time.  Leave your steri-strips (small skin tapes) in place directly over the incision.  These strips should remain on the skin for 5-7 days and then be removed.  You may get them wet in the shower and pat them dry.  You may resume regular (light) daily activities beginning the next day (such as daily self-care, walking, climbing stairs) gradually increasing activities as tolerated.  You may have sexual intercourse when it is comfortable.  Refrain from  any heavy lifting or straining until approved by your doctor.  You may drive when you no longer are taking prescription pain medication, you can comfortably wear a seatbelt, and you can safely maneuver your car and apply brakes.  You should see your doctor in the office for a follow-up appointment approximately three weeks after your surgery.  Make sure that you call for this appointment within a day or two after you arrive home to insure a convenient appointment time.  WHEN TO CALL YOUR DOCTOR: -- Fever greater than 101.5 -- Inability to urinate -- Nausea and/or vomiting - persistent -- Extreme swelling or bruising -- Continued bleeding from incision -- Increased pain, redness, or drainage from the incision -- Difficulty swallowing or breathing -- Muscle cramping or spasms -- Numbness or tingling in hands or around lips  The clinic staff is available to answer your questions during regular business hours.  Please don't hesitate to call and ask to speak to one of the nurses if you have concerns.  Shaguana Love, MD [facility] Office: [phone] 

## 2020-11-22 NOTE — Progress Notes (Signed)
Nurse reviewed discharge instructions with pt.  Pt verbalized understanding of discharge instructions, follow up appointments and new medications.  No concerns and pt opted to walk off unit at discharge.

## 2020-11-22 NOTE — Discharge Summary (Signed)
    Physician Discharge Summary [facility]  Patient ID: Kendra Gonzalez MRN: [id number] DOB/AGE: Aug 04, 1970 50 y.o.  Admit date: 11/21/2020  Discharge date: 11/22/2020  Discharge Diagnoses:  Principal Problem:   Multinodular goiter Active Problems:   Hyperthyroidism   Toxic multinodular goiter   Discharged Condition: good  Hospital Course: Patient was admitted for observation following thyroid surgery.  Post op course was uncomplicated.  Pain was well controlled.  Tolerated diet.  Post op calcium level on morning following surgery was 10.7 mg/dl.  Patient was prepared for discharge home on POD#1.   Consults: None  Treatments: surgery: total thyroidectomy  Discharge Exam: Blood pressure (!) 156/92, pulse 78, temperature 97.7 F (36.5 C), temperature source Oral, resp. rate 18, height '5\' 4"'$  (1.626 m), weight 89.8 kg, last menstrual period 06/06/1996, SpO2 100 %. HEENT - clear Neck - wound dry and intact; mild STS; Dermabond in place; mild hoarseness, no stridor Chest - clear bilaterally Cor - RRR   Disposition: Home  Discharge Instructions     Diet - low sodium heart healthy   Complete by: As directed    Ice pack   Complete by: As directed    Ice pack   Complete by: As directed    Increase activity slowly   Complete by: As directed    No dressing needed   Complete by: As directed          Follow-up Information     Armandina Gemma, MD. Schedule an appointment as soon as possible for a visit in 3 week(s).   Specialty: General Surgery Why: For wound re-check Contact information: [place] [address] [phone]                 Armandina Gemma, [facility] Office: [phone]   Signed: Armandina Gemma 11/22/2020, 8:14 AM

## 2020-11-24 NOTE — Progress Notes (Signed)
Final pathology is benign, as expected.  tmg  Armandina Gemma, MD [facility] Office: [phone]

## 2021-01-16 ENCOUNTER — Other Ambulatory Visit: Payer: Self-pay | Admitting: Internal Medicine

## 2021-01-16 DIAGNOSIS — M436 Torticollis: Secondary | ICD-10-CM

## 2021-01-17 ENCOUNTER — Other Ambulatory Visit: Payer: Self-pay

## 2021-01-17 MED ORDER — CYCLOBENZAPRINE HCL 5 MG PO TABS
5.0000 mg | ORAL_TABLET | Freq: Every evening | ORAL | 0 refills | Status: DC | PRN
  Filled 2021-01-17: qty 30, 30d supply, fill #0

## 2021-01-25 LAB — HM MAMMOGRAPHY: HM Mammogram: NORMAL (ref 0–4)

## 2021-01-30 ENCOUNTER — Encounter: Payer: Self-pay | Admitting: Internal Medicine

## 2021-01-30 ENCOUNTER — Ambulatory Visit (INDEPENDENT_AMBULATORY_CARE_PROVIDER_SITE_OTHER): Payer: 59 | Admitting: Internal Medicine

## 2021-01-30 ENCOUNTER — Other Ambulatory Visit: Payer: Self-pay

## 2021-01-30 VITALS — BP 130/90 | HR 66 | Temp 98.1°F | Wt 199.8 lb

## 2021-01-30 DIAGNOSIS — R519 Headache, unspecified: Secondary | ICD-10-CM | POA: Diagnosis not present

## 2021-01-30 MED ORDER — GABAPENTIN 300 MG PO CAPS: 300.0000 mg | ORAL_CAPSULE | Freq: Three times a day (TID) | ORAL | 0 refills | Status: DC

## 2021-01-30 MED ORDER — ALPRAZOLAM 0.25 MG PO TABS: 0.2500 mg | ORAL_TABLET | Freq: Once | ORAL | 0 refills | Status: AC

## 2021-01-30 MED ORDER — PROMETHAZINE HCL 25 MG PO TABS: 25.0000 mg | ORAL_TABLET | Freq: Once | ORAL | 0 refills | Status: DC

## 2021-01-30 NOTE — Progress Notes (Signed)
Acute office Visit     This visit occurred during the SARS-CoV-2 public health emergency.  Safety protocols were in place, including screening questions prior to the visit, additional usage of staff PPE, and extensive cleaning of exam room while observing appropriate contact time as indicated for disinfecting solutions.    CC/Reason for Visit: Severe headache  HPI: Kendra Gonzalez is a 50 y.o. female who is coming in today for the above mentioned reasons.  She has a history of headaches.  I had referred her to a headache specialist, I do not have any consultation notes.  She cannot recall if her headache was given a specific name such as migraine headache or cluster headache.  She states that she has a headache that began on Tuesday 2 days ago she describes it as a left temporal headache with ear pressure, some nausea and significant photophobia.  She has not had any URI symptoms.  She also brings in a form for intermittent FMLA for for the purpose of her migraines.  She called her headache specialist who prescribed baclofen and Zonegran which she has been taking without relief.  Past Medical/Surgical History: Past Medical History:  Diagnosis Date   Allergy    Arthritis    Asthma    Cataract    bilateral eyes   Endometriosis    GERD (gastroesophageal reflux disease)    Headache    migraines   History of tetanus, diphtheria, and acellular pertussis booster vaccination (Tdap)    07/30/2010   Hyperlipidemia    Kidney stones    Pancreatitis     Past Surgical History:  Procedure Laterality Date   ABDOMINAL HYSTERECTOMY     arthroscopic knee surgery      carpel tunnel     CHOLECYSTECTOMY     ERCP  01/08/2012   Procedure: ENDOSCOPIC RETROGRADE CHOLANGIOPANCREATOGRAPHY (ERCP);  Surgeon: Beryle Beams, MD;  Location: [facility] ENDOSCOPY;  Service: Endoscopy;  Laterality: N/A;   HIP ARTHROPLASTY     left   HIP SURGERY     oophrectomy  2011   secondary to cysts    THYROIDECTOMY N/A 11/21/2020   Procedure: TOTAL THYROIDECTOMY;  Surgeon: Armandina Gemma, MD;  Location: [facility] ORS;  Service: General;  Laterality: N/A;   TUBAL LIGATION     UPPER GASTROINTESTINAL ENDOSCOPY  2016    Social History:  reports that she has never smoked. She has never used smokeless tobacco. She reports that she does not drink alcohol and does not use drugs.  Allergies: Allergies  Allergen Reactions   Hydromorphone Itching    Family History:  Family History  Problem Relation Age of Onset   Heart disease Daughter    Hypertension Father    Hypertension Sister    Hypertension Brother    Hypertension Maternal Grandfather    Heart disease Maternal Grandfather    Hypertension Paternal Grandfather    Hypertension Brother    Hypertension Sister    Thyroid disease Neg Hx    Colon polyps Neg Hx    Colon cancer Neg Hx    Esophageal cancer Neg Hx    Rectal cancer Neg Hx    Stomach cancer Neg Hx      Current Outpatient Medications:    acetaminophen (TYLENOL) 500 MG tablet, Take 1,000 mg by mouth every 8 (eight) hours as needed for moderate pain., Disp: , Rfl:    albuterol (PROVENTIL) (2.5 MG/3ML) 0.083% nebulizer solution, Take 3 mLs (2.5 mg total) by nebulization every 6 (  six) hours as needed for wheezing or shortness of breath., Disp: 150 mL, Rfl: 1   albuterol (VENTOLIN HFA) 108 (90 Base) MCG/ACT inhaler, Inhale 2 puffs into the lungs every 6 (six) hours as needed for wheezing or shortness of breath., Disp: 18 g, Rfl: 2   ALPRAZolam (XANAX) 0.25 MG tablet, Take 1 tablet (0.25 mg total) by mouth once for 1 dose., Disp: 2 tablet, Rfl: 0   baclofen (LIORESAL) 10 MG tablet, Take 10 mg by mouth 2 (two) times daily as needed (migraines)., Disp: , Rfl:    calcium carbonate (TUMS) 500 MG chewable tablet, Chew 2 tablets (400 mg of elemental calcium total) by mouth 2 (two) times daily., Disp: 90 tablet, Rfl: 1   cyclobenzaprine (FLEXERIL) 5 MG tablet, Take 1 tablet (5 mg total) by mouth  at bedtime as needed for muscle spasms., Disp: 30 tablet, Rfl: 0   diclofenac (VOLTAREN) 75 MG EC tablet, Take 1 tablet (75 mg total) by mouth 2 (two) times daily., Disp: 30 tablet, Rfl: 0   diclofenac sodium (VOLTAREN) 1 % GEL, Apply up to three times a day as needed to large joint., Disp: 3 Tube, Rfl: 4   fluticasone-salmeterol (ADVAIR HFA) 115-21 MCG/ACT inhaler, Inhale 2 puffs into the lungs 2 (two) times daily. (Patient taking differently: Inhale 2 puffs into the lungs 2 (two) times daily as needed (asthma).), Disp: 1 Inhaler, Rfl: 2   levothyroxine (SYNTHROID) 100 MCG tablet, Take 1 tablet (100 mcg total) by mouth daily., Disp: 30 tablet, Rfl: 2   magnesium gluconate (MAGONATE) 500 MG tablet, Take 500 mg by mouth at bedtime., Disp: , Rfl:    pantoprazole (PROTONIX) 40 MG tablet, Take 40 mg by mouth daily., Disp: , Rfl:    promethazine (PHENERGAN) 25 MG tablet, Take 1 tablet (25 mg total) by mouth once for 1 dose., Disp: 2 tablet, Rfl: 0   Sodium Sulfate-Mag Sulfate-KCl (SUTAB) [phone] MG TABS, Take 1 kit by mouth as directed., Disp: 24 tablet, Rfl: 0   tiZANidine (ZANAFLEX) 2 MG tablet, Take 1-2 tablets (2-4 mg total) by mouth every 8 (eight) hours as needed for muscle spasms., Disp: 20 tablet, Rfl: 0   tobramycin-dexamethasone (TOBRADEX) ophthalmic solution, Place 1 drop into the left eye 4 (four) times daily., Disp: , Rfl:    zonisamide (ZONEGRAN) 25 MG capsule, Take 100 mg by mouth at bedtime., Disp: , Rfl:    gabapentin (NEURONTIN) 300 MG capsule, Take 1 capsule (300 mg total) by mouth 3 (three) times daily., Disp: 90 capsule, Rfl: 0  Review of Systems:  Constitutional: Denies fever, chills, diaphoresis, appetite change and fatigue.  HEENT: Denies , eye pain, redness, hearing loss, ear pain, congestion, sore throat, rhinorrhea, sneezing, mouth sores, trouble swallowing, neck pain, neck stiffness and tinnitus.   Respiratory: Denies SOB, DOE, cough, chest tightness,  and wheezing.    Cardiovascular: Denies chest pain, palpitations and leg swelling.  Gastrointestinal: Denies nausea, vomiting, abdominal pain, diarrhea, constipation, blood in stool and abdominal distention.  Genitourinary: Denies dysuria, urgency, frequency, hematuria, flank pain and difficulty urinating.  Endocrine: Denies: hot or cold intolerance, sweats, changes in hair or nails, polyuria, polydipsia. Musculoskeletal: Denies myalgias, back pain, joint swelling, arthralgias and gait problem.  Skin: Denies pallor, rash and wound.  Neurological: Denies dizziness, seizures, syncope, weakness, light-headedness, numbness and headaches.  Hematological: Denies adenopathy. Easy bruising, personal or family bleeding history  Psychiatric/Behavioral: Denies suicidal ideation, mood changes, confusion, nervousness, sleep disturbance and agitation    Physical Exam: Vitals:  01/30/21 1028  BP: 130/90  Pulse: 66  Temp: 98.1 F (36.7 C)  TempSrc: Oral  Weight: 199 lb 12.8 oz (90.6 kg)    Body mass index is 34.3 kg/m.   Constitutional: NAD, calm, comfortable Eyes: PERRL, lids and conjunctivae normal ENMT: Mucous membranes are moist. Posterior pharynx clear of any exudate or lesions. Normal dentition. Tympanic membrane is pearly white, no erythema or bulging. Neck: normal, supple, no masses, no thyromegaly Respiratory: clear to auscultation bilaterally, no wheezing, no crackles. Normal respiratory effort. No accessory muscle use.  Cardiovascular: Regular rate and rhythm, no murmurs / rubs / gallops. No extremity edema.  Neurologic: Grossly intact and nonfocal Psychiatric: Normal judgment and insight. Alert and oriented x 3. Normal mood.    Impression and Plan:  Severe headache  -Etiology unclear, she has been advised to touch base with her headache specialist for further management. -Given severity and duration I will go ahead and prescribe her 25 mg of Benadryl, 25 mg of Phenergan and 0.25 mg of  alprazolam to take all at once.  She has been advised that this will cause drowsiness and she should only take this today if she has 8 to 10 hours where she can rest.  Hopefully this will buy her some time of headache relief while she can get into see her headache specialist. -Intermittent FMLA forms will be filled out for 3 days a month.  Time spent: 31 minutes reviewing chart, interviewing and examining patient and formulating plan of care.   Patient Instructions  -Nice seeing you today!!  -Take benadryl 25 mg, phenergan 25 mg and xanax 0.25 mg once today when you have a full 10 hours that you can relax.  -Call your headache specialist for further advice.    Lelon Frohlich, MD [facility]

## 2021-01-30 NOTE — Patient Instructions (Signed)
-  Nice seeing you today!!  -Take benadryl 25 mg, phenergan 25 mg and xanax 0.25 mg once today when you have a full 10 hours that you can relax.  -Call your headache specialist for further advice.

## 2021-02-03 ENCOUNTER — Encounter: Payer: Self-pay | Admitting: Internal Medicine

## 2021-02-05 ENCOUNTER — Telehealth: Payer: Self-pay | Admitting: Internal Medicine

## 2021-02-05 NOTE — Telephone Encounter (Signed)
Patient dropped off forms that she would like Dr. Jerilee Hoh to complete.   Patient would like forms faxed over to [phone] with ATTN to [place] once forms are completed.  Forms will be placed in folder.  Please advise.

## 2021-02-10 LAB — HM MAMMOGRAPHY

## 2021-02-11 ENCOUNTER — Telehealth: Payer: Self-pay

## 2021-02-11 NOTE — Telephone Encounter (Signed)
Form given to Dr Jerilee Hoh to complete.

## 2021-02-12 NOTE — Telephone Encounter (Signed)
Form completed, faxed, and confirmed.  Patient is aware via [place].

## 2021-02-24 ENCOUNTER — Ambulatory Visit (INDEPENDENT_AMBULATORY_CARE_PROVIDER_SITE_OTHER): Payer: 59 | Admitting: Endocrinology

## 2021-02-24 ENCOUNTER — Other Ambulatory Visit (INDEPENDENT_AMBULATORY_CARE_PROVIDER_SITE_OTHER): Payer: 59

## 2021-02-24 ENCOUNTER — Other Ambulatory Visit: Payer: Self-pay

## 2021-02-24 VITALS — BP 134/80 | HR 81 | Ht 64.0 in | Wt 200.8 lb

## 2021-02-24 DIAGNOSIS — E0590 Thyrotoxicosis, unspecified without thyrotoxic crisis or storm: Secondary | ICD-10-CM

## 2021-02-24 LAB — T4, FREE: Free T4: 1.14 ng/dL (ref 0.60–1.60)

## 2021-02-24 LAB — TSH: TSH: 0.3 u[IU]/mL — ABNORMAL LOW (ref 0.35–5.50)

## 2021-02-24 LAB — VITAMIN D 25 HYDROXY (VIT D DEFICIENCY, FRACTURES): VITD: 23.14 ng/mL — ABNORMAL LOW (ref 30.00–100.00)

## 2021-02-24 NOTE — Patient Instructions (Signed)
Blood tests are requested for you today.  We'll let you know about the results.  

## 2021-02-24 NOTE — Progress Notes (Signed)
Subjective:    Patient ID: Kendra Gonzalez, female    DOB: [birth year], 50 y.o.   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 2); she was rx'ed tapazole; she declines RAI).   Pt had thyroidectomy 7/22  THYROID, TOTAL THYROIDECTOMY:  -  Nodular hyperplasia with dominant nodule  -  No malignancy identified  pt states she feels well in general.  She does not take Vit-D.  synthroid is 125/d, x 4 weeks.  Past Medical History:  Diagnosis Date   Allergy    Arthritis    Asthma    Cataract    bilateral eyes   Endometriosis    GERD (gastroesophageal reflux disease)    Headache    migraines   History of tetanus, diphtheria, and acellular pertussis booster vaccination (Tdap)    07/30/2010   Hyperlipidemia    Kidney stones    Pancreatitis     Past Surgical History:  Procedure Laterality Date   ABDOMINAL HYSTERECTOMY     arthroscopic knee surgery      carpel tunnel     CHOLECYSTECTOMY     ERCP  01/08/2012   Procedure: ENDOSCOPIC RETROGRADE CHOLANGIOPANCREATOGRAPHY (ERCP);  Surgeon: Beryle Beams, MD;  Location: [facility] ENDOSCOPY;  Service: Endoscopy;  Laterality: N/A;   HIP ARTHROPLASTY     left   HIP SURGERY     oophrectomy  2011   secondary to cysts   THYROIDECTOMY N/A 11/21/2020   Procedure: TOTAL THYROIDECTOMY;  Surgeon: Armandina Gemma, MD;  Location: [facility] ORS;  Service: General;  Laterality: N/A;   TUBAL LIGATION     UPPER GASTROINTESTINAL ENDOSCOPY  2016    Social History   Socioeconomic History   Marital status: Married    Spouse name: Fritz Pickerel   Number of children: 2   Years of education: Not on file   Highest education level: Not on file  Occupational History   Occupation: Nursing Aid    Employer: [employer]  Tobacco Use   Smoking status: Never   Smokeless tobacco: Never  Vaping Use   Vaping Use: Never used  Substance and Sexual Activity   Alcohol use: No    Drug use: No   Sexual activity: Yes    Birth control/protection: None  Other Topics Concern   Not on file  Social History Narrative   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   Adult children live in [place], [place] and [place].   Social Determinants of Health   Financial Resource Strain: Not on file  Food Insecurity: Not on file  Transportation Needs: Not on file  Physical Activity: Not on file  Stress: Not on file  Social Connections: Not on file  Intimate Partner Violence: Not on file    Current Outpatient Medications on File Prior to Visit  Medication Sig Dispense Refill   acetaminophen (TYLENOL) 500 MG tablet Take 1,000 mg by mouth every 8 (eight) hours as needed for moderate pain.     albuterol (PROVENTIL) (2.5 MG/3ML) 0.083% nebulizer solution Take 3 mLs (2.5 mg total) by nebulization every 6 (six) hours as needed for wheezing or shortness of breath. 150 mL 1   albuterol (VENTOLIN HFA) 108 (90 Base) MCG/ACT inhaler Inhale 2 puffs into the lungs every 6 (six) hours as needed for wheezing or shortness of breath. 18 g 2   baclofen (LIORESAL) 10 MG tablet  Take 10 mg by mouth 2 (two) times daily as needed (migraines).     calcium carbonate (TUMS) 500 MG chewable tablet Chew 2 tablets (400 mg of elemental calcium total) by mouth 2 (two) times daily. 90 tablet 1   cyclobenzaprine (FLEXERIL) 5 MG tablet Take 1 tablet (5 mg total) by mouth at bedtime as needed for muscle spasms. 30 tablet 0   diclofenac (VOLTAREN) 75 MG EC tablet Take 1 tablet (75 mg total) by mouth 2 (two) times daily. 30 tablet 0   diclofenac sodium (VOLTAREN) 1 % GEL Apply up to three times a day as needed to large joint. 3 Tube 4   fluticasone-salmeterol (ADVAIR HFA) 115-21 MCG/ACT inhaler Inhale 2 puffs into the lungs 2 (two) times daily. (Patient taking differently: Inhale 2 puffs into the lungs 2 (two) times daily as needed (asthma).) 1 Inhaler 2   gabapentin (NEURONTIN) 300 MG capsule Take 1  capsule (300 mg total) by mouth 3 (three) times daily. 90 capsule 0   magnesium gluconate (MAGONATE) 500 MG tablet Take 500 mg by mouth at bedtime.     pantoprazole (PROTONIX) 40 MG tablet Take 40 mg by mouth daily.     Sodium Sulfate-Mag Sulfate-KCl (SUTAB) [phone] MG TABS Take 1 kit by mouth as directed. 24 tablet 0   tiZANidine (ZANAFLEX) 2 MG tablet Take 1-2 tablets (2-4 mg total) by mouth every 8 (eight) hours as needed for muscle spasms. 20 tablet 0   tobramycin-dexamethasone (TOBRADEX) ophthalmic solution Place 1 drop into the left eye 4 (four) times daily.     zonisamide (ZONEGRAN) 25 MG capsule Take 100 mg by mouth at bedtime.     promethazine (PHENERGAN) 25 MG tablet Take 1 tablet (25 mg total) by mouth once for 1 dose. 2 tablet 0   [DISCONTINUED] methimazole (TAPAZOLE) 10 MG tablet Take 1 tablet (10 mg total) by mouth 2 (two) times daily. (Patient not taking: Reported on 10/29/2020) 180 tablet 3   No current facility-administered medications on file prior to visit.    Allergies  Allergen Reactions   Hydromorphone Itching    Family History  Problem Relation Age of Onset   Heart disease Daughter    Hypertension Father    Hypertension Sister    Hypertension Brother    Hypertension Maternal Grandfather    Heart disease Maternal Grandfather    Hypertension Paternal Grandfather    Hypertension Brother    Hypertension Sister    Thyroid disease Neg Hx    Colon polyps Neg Hx    Colon cancer Neg Hx    Esophageal cancer Neg Hx    Rectal cancer Neg Hx    Stomach cancer Neg Hx     BP 134/80 (BP Location: Right Arm, Patient Position: Sitting, Cuff Size: Normal)   Pulse 81   Ht '5\' 4"'  (1.626 m)   Wt 200 lb 12.8 oz (91.1 kg)   LMP 06/06/1996 Comment: GYN  SpO2 98%   BMI 34.47 kg/m   Review of Systems     Objective:   Physical Exam VITAL SIGNS:  See vs page GENERAL: no distress NECK: healing surgical scar  Lab Results  Component Value Date   TSH 0.30 (L)  02/24/2021      Assessment & Plan:  Hypothyroidism: overcontrolled.  I have sent a prescription to your pharmacy, to reduce synthroid Hypercalcemia: recheck today.

## 2021-02-25 MED ORDER — LEVOTHYROXINE SODIUM 112 MCG PO TABS: 112.0000 ug | ORAL_TABLET | Freq: Every day | ORAL | 3 refills | Status: DC

## 2021-02-26 ENCOUNTER — Other Ambulatory Visit: Payer: Self-pay | Admitting: Endocrinology

## 2021-02-26 LAB — PTH, INTACT AND CALCIUM
Calcium: 10.5 mg/dL — ABNORMAL HIGH (ref 8.6–10.4)
PTH: 31 pg/mL (ref 16–77)

## 2021-02-26 MED ORDER — LEVOTHYROXINE SODIUM 112 MCG PO TABS: 112.0000 ug | ORAL_TABLET | Freq: Every day | ORAL | 3 refills | Status: DC

## 2021-03-19 ENCOUNTER — Other Ambulatory Visit: Payer: Self-pay

## 2021-03-19 ENCOUNTER — Encounter: Payer: Self-pay | Admitting: Internal Medicine

## 2021-03-19 ENCOUNTER — Encounter: Payer: Self-pay | Admitting: Pulmonary Disease

## 2021-03-19 ENCOUNTER — Ambulatory Visit (INDEPENDENT_AMBULATORY_CARE_PROVIDER_SITE_OTHER): Payer: 59 | Admitting: Pulmonary Disease

## 2021-03-19 VITALS — BP 126/82 | HR 70 | Temp 97.8°F | Ht 64.0 in | Wt 201.0 lb

## 2021-03-19 DIAGNOSIS — G4709 Other insomnia: Secondary | ICD-10-CM

## 2021-03-19 DIAGNOSIS — J4530 Mild persistent asthma, uncomplicated: Secondary | ICD-10-CM

## 2021-03-19 MED ORDER — ZOLPIDEM TARTRATE 10 MG PO TABS
10.0000 mg | ORAL_TABLET | Freq: Every evening | ORAL | 3 refills | Status: DC | PRN
Start: 2021-03-19 — End: 2021-06-24

## 2021-03-19 MED ORDER — ALBUTEROL SULFATE HFA 108 (90 BASE) MCG/ACT IN AERS: 2.0000 | INHALATION_SPRAY | Freq: Four times a day (QID) | RESPIRATORY_TRACT | 2 refills | Status: DC | PRN

## 2021-03-19 MED ORDER — ADVAIR HFA 115-21 MCG/ACT IN AERO: 2.0000 | INHALATION_SPRAY | Freq: Two times a day (BID) | RESPIRATORY_TRACT | 2 refills | Status: DC

## 2021-03-19 MED ORDER — ZOLPIDEM TARTRATE 10 MG PO TABS: 10.0000 mg | ORAL_TABLET | Freq: Every evening | ORAL | 5 refills | Status: DC | PRN

## 2021-03-19 MED ORDER — ALBUTEROL SULFATE (2.5 MG/3ML) 0.083% IN NEBU: 2.5000 mg | INHALATION_SOLUTION | Freq: Four times a day (QID) | RESPIRATORY_TRACT | 1 refills | Status: DC | PRN

## 2021-03-19 NOTE — Progress Notes (Signed)
Kendra Gonzalez    [id number]    [birth year]  Primary Care Physician:Hernandez Everardo Beals, MD  Referring Physician: Orie Rout, [address],  [place] [address]  Chief complaint:   Patient with sleep onset and sleep maintenance insomnia  HPI:  Sleep onset and sleep maintenance insomnia has been going on for many years Has tried over-the-counter medications that have not really helped-melatonin  Usually tries to go to bed between 830 and 9 PM It does take her hours to fall asleep She does have multiple awakenings up to 3 times Final wake up time about 6 AM  She works different schedules that may affect bedtime and wake up time as well  There is some environmental factors that may be contributing to poor sleep as well- noise  She does have chronic hip pain that she takes Neurontin for  No other health problems thoughts contributing to sleeplessness  She denies snoring, no witnessed apneas No dryness of her mouth in the mornings  Outpatient Encounter Medications as of 03/19/2021  Medication Sig   acetaminophen (TYLENOL) 500 MG tablet Take 1,000 mg by mouth every 8 (eight) hours as needed for moderate pain.   albuterol (PROVENTIL) (2.5 MG/3ML) 0.083% nebulizer solution Take 3 mLs (2.5 mg total) by nebulization every 6 (six) hours as needed for wheezing or shortness of breath.   albuterol (VENTOLIN HFA) 108 (90 Base) MCG/ACT inhaler Inhale 2 puffs into the lungs every 6 (six) hours as needed for wheezing or shortness of breath.   baclofen (LIORESAL) 10 MG tablet Take 10 mg by mouth 2 (two) times daily as needed (migraines).   calcium carbonate (TUMS) 500 MG chewable tablet Chew 2 tablets (400 mg of elemental calcium total) by mouth 2 (two) times daily.   cyclobenzaprine (FLEXERIL) 5 MG tablet Take 1 tablet (5 mg total) by mouth at bedtime as needed for muscle spasms.   diclofenac (VOLTAREN) 75 MG EC tablet Take 1 tablet (75 mg total)  by mouth 2 (two) times daily.   diclofenac sodium (VOLTAREN) 1 % GEL Apply up to three times a day as needed to large joint.   fluticasone-salmeterol (ADVAIR HFA) 115-21 MCG/ACT inhaler Inhale 2 puffs into the lungs 2 (two) times daily. (Patient taking differently: Inhale 2 puffs into the lungs 2 (two) times daily as needed (asthma).)   levothyroxine (SYNTHROID) 112 MCG tablet Take 1 tablet (112 mcg total) by mouth daily.   magnesium gluconate (MAGONATE) 500 MG tablet Take 500 mg by mouth at bedtime.   pantoprazole (PROTONIX) 40 MG tablet Take 40 mg by mouth daily.   tiZANidine (ZANAFLEX) 2 MG tablet Take 1-2 tablets (2-4 mg total) by mouth every 8 (eight) hours as needed for muscle spasms.   zonisamide (ZONEGRAN) 25 MG capsule Take 100 mg by mouth at bedtime.   gabapentin (NEURONTIN) 300 MG capsule Take 1 capsule (300 mg total) by mouth 3 (three) times daily.   promethazine (PHENERGAN) 25 MG tablet Take 1 tablet (25 mg total) by mouth once for 1 dose.   [DISCONTINUED] methimazole (TAPAZOLE) 10 MG tablet Take 1 tablet (10 mg total) by mouth 2 (two) times daily. (Patient not taking: Reported on 10/29/2020)   [DISCONTINUED] Sodium Sulfate-Mag Sulfate-KCl (SUTAB) [phone] MG TABS Take 1 kit by mouth as directed.   [DISCONTINUED] tobramycin-dexamethasone (TOBRADEX) ophthalmic solution Place 1 drop into the left eye 4 (four) times daily.   No facility-administered encounter medications on file as of 03/19/2021.  Allergies as of 03/19/2021 - Review Complete 03/19/2021  Allergen Reaction Noted   Hydromorphone Itching 09/16/2017    Past Medical History:  Diagnosis Date   Allergy    Arthritis    Asthma    Cataract    bilateral eyes   Endometriosis    GERD (gastroesophageal reflux disease)    Headache    migraines   History of tetanus, diphtheria, and acellular pertussis booster vaccination (Tdap)    07/30/2010   Hyperlipidemia    Kidney stones    Pancreatitis     Past Surgical  History:  Procedure Laterality Date   ABDOMINAL HYSTERECTOMY     arthroscopic knee surgery      carpel tunnel     CHOLECYSTECTOMY     ERCP  01/08/2012   Procedure: ENDOSCOPIC RETROGRADE CHOLANGIOPANCREATOGRAPHY (ERCP);  Surgeon: Patrick D Hung, MD;  Location: [facility] ENDOSCOPY;  Service: Endoscopy;  Laterality: N/A;   HIP ARTHROPLASTY     left   HIP SURGERY     oophrectomy  2011   secondary to cysts   THYROIDECTOMY N/A 11/21/2020   Procedure: TOTAL THYROIDECTOMY;  Surgeon: Gerkin, Todd, MD;  Location: [facility] ORS;  Service: General;  Laterality: N/A;   TUBAL LIGATION     UPPER GASTROINTESTINAL ENDOSCOPY  2016    Family History  Problem Relation Age of Onset   Heart disease Daughter    Hypertension Father    Hypertension Sister    Hypertension Brother    Hypertension Maternal Grandfather    Heart disease Maternal Grandfather    Hypertension Paternal Grandfather    Hypertension Brother    Hypertension Sister    Thyroid disease Neg Hx    Colon polyps Neg Hx    Colon cancer Neg Hx    Esophageal cancer Neg Hx    Rectal cancer Neg Hx    Stomach cancer Neg Hx     Social History   Socioeconomic History   Marital status: Married    Spouse name: Larry   Number of children: 2   Years of education: Not on file   Highest education level: Not on file  Occupational History   Occupation: Nursing Aid    Employer: [employer]  Tobacco Use   Smoking status: Never   Smokeless tobacco: Never  Vaping Use   Vaping Use: Never used  Substance and Sexual Activity   Alcohol use: No   Drug use: No   Sexual activity: Yes    Birth control/protection: None  Other Topics Concern   Not on file  Social History Narrative   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   Adult children live in [place],  and [place].   Social Determinants of Health   Financial Resource Strain: Not on file  Food Insecurity: Not on file  Transportation Needs: Not on file  Physical Activity:  Not on file  Stress: Not on file  Social Connections: Not on file  Intimate Partner Violence: Not on file    Review of Systems  Constitutional:  Positive for fatigue.  Psychiatric/Behavioral:  Positive for sleep disturbance.    Vitals:   03/19/21 1021  BP: 126/82  Pulse: 70  Temp: 97.8 F (36.6 C)  SpO2: 100%     Physical Exam Constitutional:      Appearance: She is obese.  HENT:     Head: Normocephalic.     Nose: Nose normal.     Mouth/Throat:       Mouth: Mucous membranes are moist.     Comments: Mallampati 3, Eyes:     Pupils: Pupils are equal, round, and reactive to light.  Cardiovascular:     Rate and Rhythm: Normal rate.     Heart sounds: No murmur heard.   No friction rub.  Pulmonary:     Effort: No respiratory distress.     Breath sounds: No stridor. No wheezing or rhonchi.  Musculoskeletal:     Cervical back: No rigidity or tenderness.  Neurological:     Mental Status: She is alert.  Psychiatric:        Mood and Affect: Mood normal.   Data Reviewed: No recent labs  Assessment:  Insomnia  Denies any significant risk of sleep apnea  Environmental changes may be contributing to insomnia as well  Plan/Recommendations: Environmental changes as able  Treatment trial with Ambien  Encouraged to call us with any significant concerns  Behavioral modifications to help improve sleep quality   Adewale Olalere MD [facility] 03/19/2021, 10:29 AM  CC: Freeman, Marshall, MD    

## 2021-03-19 NOTE — Patient Instructions (Signed)
We will try Ambien  Try and limit your time in bed to 6 to 8 hours that you desire to get sleep for  Call with significant concerns  I will see you in 3 months

## 2021-03-31 ENCOUNTER — Telehealth: Payer: Self-pay | Admitting: Pulmonary Disease

## 2021-04-01 ENCOUNTER — Telehealth: Payer: Self-pay

## 2021-04-01 ENCOUNTER — Ambulatory Visit: Payer: 59 | Admitting: Endocrinology

## 2021-04-01 NOTE — Telephone Encounter (Signed)
Pt's OV notes have been printed to be faxed to [facility] at provided fax number. Nothing further needed.

## 2021-04-01 NOTE — Telephone Encounter (Signed)
Dr. Loanne Drilling pt -  Patient missed her appointment today. She would like the nurse to call her back. Patient stated she only needs a lab appointment not an office visit.   Call back phone [phone]

## 2021-04-17 ENCOUNTER — Encounter: Payer: Self-pay | Admitting: Endocrinology

## 2021-04-17 ENCOUNTER — Encounter: Payer: Self-pay | Admitting: Internal Medicine

## 2021-04-17 ENCOUNTER — Encounter: Payer: 59 | Admitting: Internal Medicine

## 2021-04-17 ENCOUNTER — Other Ambulatory Visit: Payer: Self-pay

## 2021-04-17 ENCOUNTER — Ambulatory Visit (INDEPENDENT_AMBULATORY_CARE_PROVIDER_SITE_OTHER): Payer: 59 | Admitting: Internal Medicine

## 2021-04-17 VITALS — BP 140/110 | HR 85 | Temp 98.0°F | Ht 64.0 in | Wt 203.0 lb

## 2021-04-17 DIAGNOSIS — R519 Headache, unspecified: Secondary | ICD-10-CM

## 2021-04-17 DIAGNOSIS — E039 Hypothyroidism, unspecified: Secondary | ICD-10-CM

## 2021-04-17 DIAGNOSIS — E785 Hyperlipidemia, unspecified: Secondary | ICD-10-CM | POA: Diagnosis not present

## 2021-04-17 DIAGNOSIS — Z0000 Encounter for general adult medical examination without abnormal findings: Secondary | ICD-10-CM

## 2021-04-17 DIAGNOSIS — Z23 Encounter for immunization: Secondary | ICD-10-CM

## 2021-04-17 LAB — COMPREHENSIVE METABOLIC PANEL
ALT: 11 U/L (ref 0–35)
AST: 19 U/L (ref 0–37)
Albumin: 4.6 g/dL (ref 3.5–5.2)
Alkaline Phosphatase: 95 U/L (ref 39–117)
BUN: 12 mg/dL (ref 6–23)
CO2: 29 mEq/L (ref 19–32)
Calcium: 10.7 mg/dL — ABNORMAL HIGH (ref 8.4–10.5)
Chloride: 103 mEq/L (ref 96–112)
Creatinine, Ser: 1.03 mg/dL (ref 0.40–1.20)
GFR: 62.66 mL/min (ref >60.00)
Glucose, Bld: 91 mg/dL (ref 70–99)
Potassium: 4 mEq/L (ref 3.5–5.1)
Sodium: 139 mEq/L (ref 135–145)
Total Bilirubin: 0.9 mg/dL (ref 0.2–1.2)
Total Protein: 8.2 g/dL (ref 6.0–8.3)

## 2021-04-17 LAB — LIPID PANEL
Cholesterol: 298 mg/dL — ABNORMAL HIGH (ref 0–200)
HDL: 90.2 mg/dL (ref >39.00)
LDL Cholesterol: 198 mg/dL — ABNORMAL HIGH (ref 0–99)
NonHDL: 208.11
Total CHOL/HDL Ratio: 3
Triglycerides: 52 mg/dL (ref 0.0–149.0)
VLDL: 10.4 mg/dL (ref 0.0–40.0)

## 2021-04-17 LAB — CBC WITH DIFFERENTIAL/PLATELET
Basophils Absolute: 0.1 10*3/uL (ref 0.0–0.1)
Basophils Relative: 1 % (ref 0.0–3.0)
Eosinophils Absolute: 0.1 10*3/uL (ref 0.0–0.7)
Eosinophils Relative: 1.9 % (ref 0.0–5.0)
HCT: 43.5 % (ref 36.0–46.0)
Hemoglobin: 14.5 g/dL (ref 12.0–15.0)
Lymphocytes Relative: 38.6 % (ref 12.0–46.0)
Lymphs Abs: 2.7 10*3/uL (ref 0.7–4.0)
MCHC: 33.3 g/dL (ref 30.0–36.0)
MCV: 87.9 fl (ref 78.0–100.0)
Monocytes Absolute: 0.3 10*3/uL (ref 0.1–1.0)
Monocytes Relative: 4.6 % (ref 3.0–12.0)
Neutro Abs: 3.8 10*3/uL (ref 1.4–7.7)
Neutrophils Relative %: 53.9 % (ref 43.0–77.0)
Platelets: 299 10*3/uL (ref 150.0–400.0)
RBC: 4.95 Mil/uL (ref 3.87–5.11)
RDW: 13.7 % (ref 11.5–15.5)
WBC: 7 10*3/uL (ref 4.0–10.5)

## 2021-04-17 LAB — HEMOGLOBIN A1C: Hgb A1c MFr Bld: 5.9 % (ref 4.6–6.5)

## 2021-04-17 LAB — VITAMIN B12: Vitamin B-12: 615 pg/mL (ref 211–911)

## 2021-04-17 LAB — VITAMIN D 25 HYDROXY (VIT D DEFICIENCY, FRACTURES): VITD: 28.44 ng/mL — ABNORMAL LOW (ref 30.00–100.00)

## 2021-04-17 LAB — TSH: TSH: 16.21 u[IU]/mL — ABNORMAL HIGH (ref 0.35–5.50)

## 2021-04-17 NOTE — Progress Notes (Signed)
Established Patient Office Visit     This visit occurred during the SARS-CoV-2 public health emergency.  Safety protocols were in place, including screening questions prior to the visit, additional usage of staff PPE, and extensive cleaning of exam room while observing appropriate contact time as indicated for disinfecting solutions.    CC/Reason for Visit: Annual preventive exam  HPI: Kendra Gonzalez is a 50 y.o. female who is coming in today for the above mentioned reasons. Past Medical History is significant for: Hypothyroidism followed by endocrine she has been dealing with significant headaches.  She has been seeing the headache specialty clinic but has not been happy with their treatment plan.  She has routine eye and dental care.  She is overdue for Tdap and COVID booster.  Colonoscopy and mammogram are up-to-date.  Her gynecologist told her she no longer needed Pap smears due to having had a complete hysterectomy.  She continues to be dealing with elevated blood pressures.  She thinks that she only has elevated blood pressure when she has a headache not the other way around.   Past Medical/Surgical History: Past Medical History:  Diagnosis Date   Allergy    Arthritis    Asthma    Cataract    bilateral eyes   Endometriosis    GERD (gastroesophageal reflux disease)    Headache    migraines   History of tetanus, diphtheria, and acellular pertussis booster vaccination (Tdap)    07/30/2010   Hyperlipidemia    Kidney stones    Pancreatitis     Past Surgical History:  Procedure Laterality Date   ABDOMINAL HYSTERECTOMY     arthroscopic knee surgery      carpel tunnel     CHOLECYSTECTOMY     ERCP  01/08/2012   Procedure: ENDOSCOPIC RETROGRADE CHOLANGIOPANCREATOGRAPHY (ERCP);  Surgeon: Beryle Beams, MD;  Location: [facility] ENDOSCOPY;  Service: Endoscopy;  Laterality: N/A;   HIP ARTHROPLASTY     left   HIP SURGERY     oophrectomy  2011   secondary to cysts    THYROIDECTOMY N/A 11/21/2020   Procedure: TOTAL THYROIDECTOMY;  Surgeon: Armandina Gemma, MD;  Location: [facility] ORS;  Service: General;  Laterality: N/A;   TUBAL LIGATION     UPPER GASTROINTESTINAL ENDOSCOPY  2016    Social History:  reports that she has never smoked. She has never used smokeless tobacco. She reports that she does not drink alcohol and does not use drugs.  Allergies: Allergies  Allergen Reactions   Hydromorphone Itching    Family History:  Family History  Problem Relation Age of Onset   Heart disease Daughter    Hypertension Father    Hypertension Sister    Hypertension Brother    Hypertension Maternal Grandfather    Heart disease Maternal Grandfather    Hypertension Paternal Grandfather    Hypertension Brother    Hypertension Sister    Thyroid disease Neg Hx    Colon polyps Neg Hx    Colon cancer Neg Hx    Esophageal cancer Neg Hx    Rectal cancer Neg Hx    Stomach cancer Neg Hx      Current Outpatient Medications:    acetaminophen (TYLENOL) 500 MG tablet, Take 1,000 mg by mouth every 8 (eight) hours as needed for moderate pain., Disp: , Rfl:    albuterol (PROVENTIL) (2.5 MG/3ML) 0.083% nebulizer solution, Take 3 mLs (2.5 mg total) by nebulization every 6 (six) hours as needed for wheezing or  shortness of breath., Disp: 150 mL, Rfl: 1   albuterol (VENTOLIN HFA) 108 (90 Base) MCG/ACT inhaler, Inhale 2 puffs into the lungs every 6 (six) hours as needed for wheezing or shortness of breath., Disp: 18 g, Rfl: 2   baclofen (LIORESAL) 10 MG tablet, Take 10 mg by mouth 2 (two) times daily as needed (migraines)., Disp: , Rfl:    calcium carbonate (TUMS) 500 MG chewable tablet, Chew 2 tablets (400 mg of elemental calcium total) by mouth 2 (two) times daily., Disp: 90 tablet, Rfl: 1   cyclobenzaprine (FLEXERIL) 5 MG tablet, Take 1 tablet (5 mg total) by mouth at bedtime as needed for muscle spasms., Disp: 30 tablet, Rfl: 0   diclofenac sodium (VOLTAREN) 1 % GEL, Apply up to  three times a day as needed to large joint., Disp: 3 Tube, Rfl: 4   fluticasone-salmeterol (ADVAIR HFA) 115-21 MCG/ACT inhaler, Inhale 2 puffs into the lungs 2 (two) times daily., Disp: 1 each, Rfl: 2   levothyroxine (SYNTHROID) 112 MCG tablet, Take 1 tablet (112 mcg total) by mouth daily., Disp: 90 tablet, Rfl: 3   magnesium gluconate (MAGONATE) 500 MG tablet, Take 500 mg by mouth at bedtime., Disp: , Rfl:    pantoprazole (PROTONIX) 40 MG tablet, Take 40 mg by mouth daily., Disp: , Rfl:    tiZANidine (ZANAFLEX) 2 MG tablet, Take 1-2 tablets (2-4 mg total) by mouth every 8 (eight) hours as needed for muscle spasms., Disp: 20 tablet, Rfl: 0   zolpidem (AMBIEN) 10 MG tablet, Take 1 tablet (10 mg total) by mouth at bedtime as needed for sleep., Disp: 30 tablet, Rfl: 3   zonisamide (ZONEGRAN) 25 MG capsule, Take 100 mg by mouth at bedtime., Disp: , Rfl:    diclofenac (VOLTAREN) 75 MG EC tablet, Take 1 tablet (75 mg total) by mouth 2 (two) times daily. (Patient not taking: Reported on 04/17/2021), Disp: 30 tablet, Rfl: 0   gabapentin (NEURONTIN) 300 MG capsule, Take 1 capsule (300 mg total) by mouth 3 (three) times daily., Disp: 90 capsule, Rfl: 0   promethazine (PHENERGAN) 25 MG tablet, Take 1 tablet (25 mg total) by mouth once for 1 dose., Disp: 2 tablet, Rfl: 0  Review of Systems:  Constitutional: Denies fever, chills, diaphoresis, appetite change and fatigue.  HEENT: Denies photophobia, eye pain, redness, hearing loss, ear pain, congestion, sore throat, rhinorrhea, sneezing, mouth sores, trouble swallowing, neck pain, neck stiffness and tinnitus.   Respiratory: Denies SOB, DOE, cough, chest tightness,  and wheezing.   Cardiovascular: Denies chest pain, palpitations and leg swelling.  Gastrointestinal: Denies nausea, vomiting, abdominal pain, diarrhea, constipation, blood in stool and abdominal distention.  Genitourinary: Denies dysuria, urgency, frequency, hematuria, flank pain and difficulty  urinating.  Endocrine: Denies: hot or cold intolerance, sweats, changes in hair or nails, polyuria, polydipsia. Musculoskeletal: Denies myalgias, back pain, joint swelling, arthralgias and gait problem.  Skin: Denies pallor, rash and wound.  Neurological: Denies dizziness, seizures, syncope, weakness, light-headedness, numbness. Hematological: Denies adenopathy. Easy bruising, personal or family bleeding history  Psychiatric/Behavioral: Denies suicidal ideation, mood changes, confusion, nervousness, sleep disturbance and agitation    Physical Exam: Vitals:   04/17/21 1257  BP: (!) 140/110  Pulse: 85  Temp: 98 F (36.7 C)  TempSrc: Oral  SpO2: 98%  Weight: 203 lb (92.1 kg)  Height: 5\' 4"  (1.626 m)    Body mass index is 34.84 kg/m.   Constitutional: NAD, calm, comfortable Eyes: PERRL, lids and conjunctivae normal ENMT: Mucous membranes are moist.  Posterior pharynx clear of any exudate or lesions. Normal dentition. Tympanic membrane is pearly white, no erythema or bulging. Neck: normal, supple, no masses, no thyromegaly Respiratory: clear to auscultation bilaterally, no wheezing, no crackles. Normal respiratory effort. No accessory muscle use.  Cardiovascular: Regular rate and rhythm, no murmurs / rubs / gallops. No extremity edema. 2+ pedal pulses. No carotid bruits.  Abdomen: no tenderness, no masses palpated. No hepatosplenomegaly. Bowel sounds positive.  Musculoskeletal: no clubbing / cyanosis. No joint deformity upper and lower extremities. Good ROM, no contractures. Normal muscle tone.  Skin: no rashes, lesions, ulcers. No induration Neurologic: CN 2-12 grossly intact. Sensation intact, DTR normal. Strength 5/5 in all 4.  Psychiatric: Normal judgment and insight. Alert and oriented x 3. Normal mood.    Impression and Plan:  Encounter for preventive health examination -Recommend routine eye and dental care. -Immunizations: Tdap today, referral, otherwise immunizations  are up-to-date -Healthy lifestyle discussed in detail. -Labs to be updated today. -Colon cancer screening: 10/2019 -Breast cancer screening: 01/2021 -Cervical cancer screening: Per GYN no further needed -Lung cancer screening: Not applicable -Prostate cancer screening: Not applicable -DEXA: Not applicable  Need for Tdap vaccination -Tdap administered today.  Hypercalcemia  - Plan: Comprehensive metabolic panel  Hyperlipidemia, unspecified hyperlipidemia type -Check lipids today.  Hypothyroidism, unspecified type -Followed by endocrinology, recently had a levothyroxine dose decreased.  Intractable headache, unspecified chronicity pattern, unspecified headache type  -Suspect may be related to elevated diastolic blood pressure. -She will do ambulatory blood pressure monitoring and return in 2 weeks for follow-up.  If blood pressures are normal, can consider referral to neurology for further diagnosis and management.    Lelon Frohlich, MD [facility]

## 2021-04-17 NOTE — Addendum Note (Signed)
Addended by: Westley Hummer B on: 04/17/2021 01:41 PM   Modules accepted: Orders

## 2021-04-18 ENCOUNTER — Encounter: Payer: Self-pay | Admitting: Internal Medicine

## 2021-04-18 ENCOUNTER — Other Ambulatory Visit: Payer: Self-pay | Admitting: Internal Medicine

## 2021-04-18 DIAGNOSIS — E559 Vitamin D deficiency, unspecified: Secondary | ICD-10-CM | POA: Insufficient documentation

## 2021-04-18 DIAGNOSIS — E782 Mixed hyperlipidemia: Secondary | ICD-10-CM

## 2021-04-18 DIAGNOSIS — R7302 Impaired glucose tolerance (oral): Secondary | ICD-10-CM | POA: Insufficient documentation

## 2021-04-18 MED ORDER — VITAMIN D (ERGOCALCIFEROL) 1.25 MG (50000 UNIT) PO CAPS: 50000.0000 [IU] | ORAL_CAPSULE | ORAL | 0 refills | Status: AC

## 2021-04-18 MED ORDER — ATORVASTATIN CALCIUM 40 MG PO TABS: 40.0000 mg | ORAL_TABLET | Freq: Every day | ORAL | 1 refills | Status: DC

## 2021-04-18 NOTE — Progress Notes (Signed)
Some issues here:  1. Vit D def: 50000 IU weekly x 12 weeks with follow up levels then. Will send Rx.  2. Her TSH is high, she needs levothyroxine dose increase,  this is managed by endocrinology. Please forward to Dr. Loanne Drilling and let patient know results.  3. 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 Work on lifestyle changes.  4. 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 Is she taking any calcium supplements? Please ask her to stop. Let's add a PTH to her labs as she has had hypercalcemia for at least 1 year.  5. With an LDL Cholesterol of 198, needs a statin (high risk for heart disease and stroke). Start atorvastatin 40 mg at bedtime and recheck lipids in 3 months. As above, lifestyle changes are imperative.

## 2021-04-22 ENCOUNTER — Telehealth: Payer: Self-pay | Admitting: Internal Medicine

## 2021-04-22 NOTE — Addendum Note (Signed)
Addended by: Agnes Lawrence on: 04/22/2021 11:48 AM   Modules accepted: Orders

## 2021-04-22 NOTE — Telephone Encounter (Signed)
Santiago Glad from [facility] lab called because they received a PTH add on for lab, but they will need a recollect on that sample as they cannot use sample after one day.      Please advise

## 2021-04-23 NOTE — Telephone Encounter (Signed)
Left a message for the patient to return my call.  

## 2021-04-23 NOTE — Telephone Encounter (Signed)
Spoke with the patient and informed her of the message below.  Lab appt scheduled for 12/29.

## 2021-04-23 NOTE — Telephone Encounter (Signed)
Patient returned call for Mechele Claude, I let her know that she was unavailable and would call when she was. Patient verbalized understanding.    Please advise

## 2021-04-24 ENCOUNTER — Other Ambulatory Visit: Payer: 59

## 2021-04-24 DIAGNOSIS — E785 Hyperlipidemia, unspecified: Secondary | ICD-10-CM

## 2021-04-24 LAB — LIPID PANEL
Cholesterol: 259 mg/dL — ABNORMAL HIGH (ref 0–200)
HDL: 78.1 mg/dL (ref >39.00)
LDL Cholesterol: 170 mg/dL — ABNORMAL HIGH (ref 0–99)
NonHDL: 181.13
Total CHOL/HDL Ratio: 3
Triglycerides: 55 mg/dL (ref 0.0–149.0)
VLDL: 11 mg/dL (ref 0.0–40.0)

## 2021-04-25 ENCOUNTER — Other Ambulatory Visit: Payer: Self-pay | Admitting: Internal Medicine

## 2021-05-08 ENCOUNTER — Encounter: Payer: Self-pay | Admitting: Internal Medicine

## 2021-05-13 ENCOUNTER — Ambulatory Visit: Payer: 59 | Admitting: Internal Medicine

## 2021-05-13 ENCOUNTER — Encounter: Payer: Self-pay | Admitting: Internal Medicine

## 2021-05-13 VITALS — BP 150/100 | HR 70 | Temp 98.1°F | Resp 16 | Ht 64.0 in | Wt 210.0 lb

## 2021-05-13 DIAGNOSIS — I10 Essential (primary) hypertension: Secondary | ICD-10-CM | POA: Diagnosis not present

## 2021-05-13 MED ORDER — VALSARTAN-HYDROCHLOROTHIAZIDE 80-12.5 MG PO TABS: 1.0000 | ORAL_TABLET | Freq: Every day | ORAL | 3 refills | Status: DC

## 2021-05-13 NOTE — Progress Notes (Signed)
Established Patient Office Visit     This visit occurred during the SARS-CoV-2 public health emergency.  Safety protocols were in place, including screening questions prior to the visit, additional usage of staff PPE, and extensive cleaning of exam room while observing appropriate contact time as indicated for disinfecting solutions.    CC/Reason for Visit: Follow-up blood pressure  HPI: Lowen Mansouri Omlor is a 51 y.o. female who is coming in today for the above mentioned reasons.  During her last visit she was noted to have elevated blood pressure and headache.  She was asked to keep a blood pressure log at home which I have inserted below.  She has been noted to have variable blood pressure but on average diastolics have been in the 90s.  In office today blood pressure was initially taking at 118/80, but on recheck by myself is 150/100.     Past Medical/Surgical History: Past Medical History:  Diagnosis Date   Allergy    Arthritis    Asthma    Cataract    bilateral eyes   Endometriosis    GERD (gastroesophageal reflux disease)    Headache    migraines   History of tetanus, diphtheria, and acellular pertussis booster vaccination (Tdap)    07/30/2010   Hyperlipidemia    Kidney stones    Pancreatitis     Past Surgical History:  Procedure Laterality Date   ABDOMINAL HYSTERECTOMY     arthroscopic knee surgery      carpel tunnel     CHOLECYSTECTOMY     ERCP  01/08/2012   Procedure: ENDOSCOPIC RETROGRADE CHOLANGIOPANCREATOGRAPHY (ERCP);  Surgeon: Beryle Beams, MD;  Location: [facility] ENDOSCOPY;  Service: Endoscopy;  Laterality: N/A;   HIP ARTHROPLASTY     left   HIP SURGERY     oophrectomy  2011   secondary to cysts   THYROIDECTOMY N/A 11/21/2020   Procedure: TOTAL THYROIDECTOMY;  Surgeon: Armandina Gemma, MD;  Location: [facility] ORS;  Service: General;  Laterality: N/A;   TUBAL LIGATION     UPPER GASTROINTESTINAL ENDOSCOPY  2016    Social History:  reports that she has  never smoked. She has never used smokeless tobacco. She reports that she does not drink alcohol and does not use drugs.  Allergies: Allergies  Allergen Reactions   Hydromorphone Itching    Family History:  Family History  Problem Relation Age of Onset   Heart disease Daughter    Hypertension Father    Hypertension Sister    Hypertension Brother    Hypertension Maternal Grandfather    Heart disease Maternal Grandfather    Hypertension Paternal Grandfather    Hypertension Brother    Hypertension Sister    Thyroid disease Neg Hx    Colon polyps Neg Hx    Colon cancer Neg Hx    Esophageal cancer Neg Hx    Rectal cancer Neg Hx    Stomach cancer Neg Hx      Current Outpatient Medications:    valsartan-hydrochlorothiazide (DIOVAN-HCT) 80-12.5 MG tablet, Take 1 tablet by mouth daily., Disp: 90 tablet, Rfl: 3   acetaminophen (TYLENOL) 500 MG tablet, Take 1,000 mg by mouth every 8 (eight) hours as needed for moderate pain., Disp: , Rfl:    albuterol (PROVENTIL) (2.5 MG/3ML) 0.083% nebulizer solution, Take 3 mLs (2.5 mg total) by nebulization every 6 (six) hours as needed for wheezing or shortness of breath., Disp: 150 mL, Rfl: 1   albuterol (VENTOLIN HFA) 108 (90 Base) MCG/ACT  inhaler, Inhale 2 puffs into the lungs every 6 (six) hours as needed for wheezing or shortness of breath., Disp: 18 g, Rfl: 2   atorvastatin (LIPITOR) 40 MG tablet, Take 1 tablet (40 mg total) by mouth daily., Disp: 90 tablet, Rfl: 1   baclofen (LIORESAL) 10 MG tablet, Take 10 mg by mouth 2 (two) times daily as needed (migraines)., Disp: , Rfl:    calcium carbonate (TUMS) 500 MG chewable tablet, Chew 2 tablets (400 mg of elemental calcium total) by mouth 2 (two) times daily., Disp: 90 tablet, Rfl: 1   cyclobenzaprine (FLEXERIL) 5 MG tablet, Take 1 tablet (5 mg total) by mouth at bedtime as needed for muscle spasms., Disp: 30 tablet, Rfl: 0   diclofenac (VOLTAREN) 75 MG EC tablet, Take 1 tablet (75 mg total) by mouth  2 (two) times daily. (Patient not taking: Reported on 04/17/2021), Disp: 30 tablet, Rfl: 0   diclofenac sodium (VOLTAREN) 1 % GEL, Apply up to three times a day as needed to large joint., Disp: 3 Tube, Rfl: 4   fluticasone-salmeterol (ADVAIR HFA) 115-21 MCG/ACT inhaler, Inhale 2 puffs into the lungs 2 (two) times daily., Disp: 1 each, Rfl: 2   gabapentin (NEURONTIN) 300 MG capsule, Take 1 capsule (300 mg total) by mouth 3 (three) times daily., Disp: 90 capsule, Rfl: 0   levothyroxine (SYNTHROID) 112 MCG tablet, Take 1 tablet (112 mcg total) by mouth daily., Disp: 90 tablet, Rfl: 3   magnesium gluconate (MAGONATE) 500 MG tablet, Take 500 mg by mouth at bedtime., Disp: , Rfl:    pantoprazole (PROTONIX) 40 MG tablet, Take 40 mg by mouth daily., Disp: , Rfl:    promethazine (PHENERGAN) 25 MG tablet, Take 1 tablet (25 mg total) by mouth once for 1 dose., Disp: 2 tablet, Rfl: 0   tiZANidine (ZANAFLEX) 2 MG tablet, Take 1-2 tablets (2-4 mg total) by mouth every 8 (eight) hours as needed for muscle spasms., Disp: 20 tablet, Rfl: 0   Vitamin D, Ergocalciferol, (DRISDOL) 1.25 MG (50000 UNIT) CAPS capsule, Take 1 capsule (50,000 Units total) by mouth every 7 (seven) days for 12 doses., Disp: 12 capsule, Rfl: 0   zolpidem (AMBIEN) 10 MG tablet, Take 1 tablet (10 mg total) by mouth at bedtime as needed for sleep., Disp: 30 tablet, Rfl: 3   zonisamide (ZONEGRAN) 25 MG capsule, Take 100 mg by mouth at bedtime., Disp: , Rfl:   Review of Systems:  Constitutional: Denies fever, chills, diaphoresis, appetite change and fatigue.  HEENT: Denies photophobia, eye pain, redness, hearing loss, ear pain, congestion, sore throat, rhinorrhea, sneezing, mouth sores, trouble swallowing, neck pain, neck stiffness and tinnitus.   Respiratory: Denies SOB, DOE, cough, chest tightness,  and wheezing.   Cardiovascular: Denies chest pain, palpitations and leg swelling.  Gastrointestinal: Denies nausea, vomiting, abdominal pain,  diarrhea, constipation, blood in stool and abdominal distention.  Genitourinary: Denies dysuria, urgency, frequency, hematuria, flank pain and difficulty urinating.  Endocrine: Denies: hot or cold intolerance, sweats, changes in hair or nails, polyuria, polydipsia. Musculoskeletal: Denies myalgias, back pain, joint swelling, arthralgias and gait problem.  Skin: Denies pallor, rash and wound.  Neurological: Denies dizziness, seizures, syncope, weakness, light-headedness, numbness. Hematological: Denies adenopathy. Easy bruising, personal or family bleeding history  Psychiatric/Behavioral: Denies suicidal ideation, mood changes, confusion, nervousness, sleep disturbance and agitation    Physical Exam: Vitals:   05/13/21 1552  BP: 118/80  Pulse: 70  Resp: 16  Temp: 98.1 F (36.7 C)  SpO2: 99%  Weight: 210  lb (95.3 kg)  Height: 5\' 4"  (1.626 m)    Body mass index is 36.05 kg/m.   Constitutional: NAD, calm, comfortable Eyes: PERRL, lids and conjunctivae normal ENMT: Mucous membranes are moist.  Neurologic: Grossly intact and nonfocal Psychiatric: Normal judgment and insight. Alert and oriented x 3. Normal mood.    Impression and Plan:  Primary hypertension   Plan: valsartan-hydrochlorothiazide (DIOVAN-HCT) 80-12.5 MG tablet -With elevated measurements at home and in the office today I will go ahead and make the diagnosis of hypertension. -Will start on Diovan HCT 80/12.5 mg.  She will return in 6 weeks for follow-up.  Time spent: 30 minutes reviewing chart, interviewing and examining patient and formulating plan of care.   Patient Instructions  -Nice seeing you today!!  -Start Diovan HCT 1 tablet daily.  -Schedule follow up in 6 weeks for your BP.    Lelon Frohlich, MD [facility]

## 2021-05-13 NOTE — Patient Instructions (Signed)
-  Nice seeing you today!!  -Start Diovan HCT 1 tablet daily.  -Schedule follow up in 6 weeks for your BP.

## 2021-05-16 ENCOUNTER — Ambulatory Visit: Payer: 59 | Admitting: Endocrinology

## 2021-05-16 ENCOUNTER — Other Ambulatory Visit: Payer: Self-pay

## 2021-05-16 DIAGNOSIS — E039 Hypothyroidism, unspecified: Secondary | ICD-10-CM | POA: Diagnosis not present

## 2021-05-16 LAB — T4, FREE: Free T4: 0.67 ng/dL (ref 0.60–1.60)

## 2021-05-16 LAB — VITAMIN D 25 HYDROXY (VIT D DEFICIENCY, FRACTURES): VITD: 29.48 ng/mL — ABNORMAL LOW (ref 30.00–100.00)

## 2021-05-16 LAB — TSH: TSH: 23.34 u[IU]/mL — ABNORMAL HIGH (ref 0.35–5.50)

## 2021-05-16 NOTE — Patient Instructions (Addendum)
Blood tests are requested for you today.  We'll let you know about the results.  Please come back for a follow-up appointment in 6 months.   

## 2021-05-16 NOTE — Progress Notes (Signed)
Subjective:    Patient ID: Kendra Gonzalez, female    DOB: [birth year], 51 y.o.   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 2); she was rx'ed tapazole; she declines RAI).   Pt had thyroidectomy 7/22; path was benign).  pt states she feels well in general.  She takes high-dose Vit-D, as rx'ed.  Pt says she never misses synthroid.  Pt says she is uncertain why the thyroid is low.  She has not takes Tums or any other Ca++ medication.   Past Medical History:  Diagnosis Date   Allergy    Arthritis    Asthma    Cataract    bilateral eyes   Endometriosis    GERD (gastroesophageal reflux disease)    Headache    migraines   History of tetanus, diphtheria, and acellular pertussis booster vaccination (Tdap)    07/30/2010   Hyperlipidemia    Kidney stones    Pancreatitis     Past Surgical History:  Procedure Laterality Date   ABDOMINAL HYSTERECTOMY     arthroscopic knee surgery      carpel tunnel     CHOLECYSTECTOMY     ERCP  01/08/2012   Procedure: ENDOSCOPIC RETROGRADE CHOLANGIOPANCREATOGRAPHY (ERCP);  Surgeon: Beryle Beams, MD;  Location: [facility] ENDOSCOPY;  Service: Endoscopy;  Laterality: N/A;   HIP ARTHROPLASTY     left   HIP SURGERY     oophrectomy  2011   secondary to cysts   THYROIDECTOMY N/A 11/21/2020   Procedure: TOTAL THYROIDECTOMY;  Surgeon: Armandina Gemma, MD;  Location: [facility] ORS;  Service: General;  Laterality: N/A;   TUBAL LIGATION     UPPER GASTROINTESTINAL ENDOSCOPY  2016    Social History   Socioeconomic History   Marital status: Married    Spouse name: Fritz Pickerel   Number of children: 2   Years of education: Not on file   Highest education level: Not on file  Occupational History   Occupation: Nursing Aid    Employer: [employer]  Tobacco Use   Smoking status: Never   Smokeless tobacco: Never  Vaping Use   Vaping Use: Never used  Substance and Sexual  Activity   Alcohol use: No   Drug use: No   Sexual activity: Yes    Birth control/protection: None  Other Topics Concern   Not on file  Social History Narrative   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   Adult children live in [place], [place] and [place].   Social Determinants of Health   Financial Resource Strain: Not on file  Food Insecurity: Not on file  Transportation Needs: Not on file  Physical Activity: Not on file  Stress: Not on file  Social Connections: Not on file  Intimate Partner Violence: Not on file    Current Outpatient Medications on File Prior to Visit  Medication Sig Dispense Refill   acetaminophen (TYLENOL) 500 MG tablet Take 1,000 mg by mouth every 8 (eight) hours as needed for moderate pain.     albuterol (PROVENTIL) (2.5 MG/3ML) 0.083% nebulizer solution Take 3 mLs (2.5 mg total) by nebulization every 6 (six) hours as needed for wheezing or shortness of breath. 150 mL 1   albuterol (VENTOLIN HFA) 108 (90 Base) MCG/ACT inhaler Inhale 2 puffs into the lungs every 6 (six) hours as needed for wheezing or shortness of breath. [place]  g 2   atorvastatin (LIPITOR) 40 MG tablet Take 1 tablet (40 mg total) by mouth daily. 90 tablet 1   baclofen (LIORESAL) 10 MG tablet Take 10 mg by mouth 2 (two) times daily as needed (migraines).     cyclobenzaprine (FLEXERIL) 5 MG tablet Take 1 tablet (5 mg total) by mouth at bedtime as needed for muscle spasms. 30 tablet 0   diclofenac (VOLTAREN) 75 MG EC tablet Take 1 tablet (75 mg total) by mouth 2 (two) times daily. 30 tablet 0   diclofenac sodium (VOLTAREN) 1 % GEL Apply up to three times a day as needed to large joint. 3 Tube 4   fluticasone-salmeterol (ADVAIR HFA) 115-21 MCG/ACT inhaler Inhale 2 puffs into the lungs 2 (two) times daily. 1 each 2   magnesium gluconate (MAGONATE) 500 MG tablet Take 500 mg by mouth at bedtime.     pantoprazole (PROTONIX) 40 MG tablet Take 40 mg by mouth daily.     tiZANidine  (ZANAFLEX) 2 MG tablet Take 1-2 tablets (2-4 mg total) by mouth every 8 (eight) hours as needed for muscle spasms. 20 tablet 0   valsartan-hydrochlorothiazide (DIOVAN-HCT) 80-12.5 MG tablet Take 1 tablet by mouth daily. 90 tablet 3   Vitamin D, Ergocalciferol, (DRISDOL) 1.25 MG (50000 UNIT) CAPS capsule Take 1 capsule (50,000 Units total) by mouth every 7 (seven) days for 12 doses. 12 capsule 0   zonisamide (ZONEGRAN) 25 MG capsule Take 100 mg by mouth at bedtime.     gabapentin (NEURONTIN) 300 MG capsule Take 1 capsule (300 mg total) by mouth 3 (three) times daily. 90 capsule 0   promethazine (PHENERGAN) 25 MG tablet Take 1 tablet (25 mg total) by mouth once for 1 dose. 2 tablet 0   zolpidem (AMBIEN) 10 MG tablet Take 1 tablet (10 mg total) by mouth at bedtime as needed for sleep. 30 tablet 3   [DISCONTINUED] methimazole (TAPAZOLE) 10 MG tablet Take 1 tablet (10 mg total) by mouth 2 (two) times daily. (Patient not taking: Reported on 10/29/2020) 180 tablet 3   No current facility-administered medications on file prior to visit.    Allergies  Allergen Reactions   Hydromorphone Itching    Family History  Problem Relation Age of Onset   Heart disease Daughter    Hypertension Father    Hypertension Sister    Hypertension Brother    Hypertension Maternal Grandfather    Heart disease Maternal Grandfather    Hypertension Paternal Grandfather    Hypertension Brother    Hypertension Sister    Thyroid disease Neg Hx    Colon polyps Neg Hx    Colon cancer Neg Hx    Esophageal cancer Neg Hx    Rectal cancer Neg Hx    Stomach cancer Neg Hx     BP (!) 130/100    Pulse 72    Ht 5\' 4"  (1.626 m)    Wt 205 lb 3.2 oz (93.1 kg)    LMP 06/06/1996 Comment: GYN   SpO2 99%    BMI 35.22 kg/m    Review of Systems     Objective:   Physical Exam VITAL SIGNS:  See vs page.   GENERAL: no distress.   Neck: a healed scar is present.  I do not appreciate a nodule in the thyroid or elsewhere in the neck.       Lab Results  Component Value Date   TSH 23.34 (H) 05/16/2021      Assessment & Plan:  Hypothyroidism: uncontrolled.  I have sent a prescription to your pharmacy, to increase synthroid. Hypercalcemia: recheck today

## 2021-05-17 MED ORDER — LEVOTHYROXINE SODIUM 137 MCG PO TABS: 137.0000 ug | ORAL_TABLET | Freq: Every day | ORAL | 3 refills | Status: DC

## 2021-05-19 LAB — PTH, INTACT AND CALCIUM
Calcium: 10.2 mg/dL (ref 8.6–10.4)
PTH: 43 pg/mL (ref 16–77)

## 2021-05-21 ENCOUNTER — Encounter: Payer: Self-pay | Admitting: Internal Medicine

## 2021-05-21 MED ORDER — VALACYCLOVIR HCL 1 G PO TABS
2000.0000 mg | ORAL_TABLET | Freq: Two times a day (BID) | ORAL | 1 refills | Status: AC
Start: 2021-05-21 — End: 2021-05-22

## 2021-05-27 ENCOUNTER — Encounter: Payer: Self-pay | Admitting: Internal Medicine

## 2021-05-27 DIAGNOSIS — R519 Headache, unspecified: Secondary | ICD-10-CM

## 2021-05-28 ENCOUNTER — Encounter: Payer: Self-pay | Admitting: Neurology

## 2021-06-24 ENCOUNTER — Encounter: Payer: Self-pay | Admitting: Internal Medicine

## 2021-06-24 ENCOUNTER — Ambulatory Visit: Payer: 59 | Admitting: Internal Medicine

## 2021-06-24 VITALS — BP 130/90 | HR 70 | Temp 98.0°F | Wt 210.2 lb

## 2021-06-24 DIAGNOSIS — E559 Vitamin D deficiency, unspecified: Secondary | ICD-10-CM

## 2021-06-24 DIAGNOSIS — E785 Hyperlipidemia, unspecified: Secondary | ICD-10-CM

## 2021-06-24 DIAGNOSIS — I10 Essential (primary) hypertension: Secondary | ICD-10-CM

## 2021-06-24 DIAGNOSIS — Z1231 Encounter for screening mammogram for malignant neoplasm of breast: Secondary | ICD-10-CM | POA: Diagnosis not present

## 2021-06-24 LAB — COMPREHENSIVE METABOLIC PANEL
ALT: 15 U/L (ref 0–35)
AST: 20 U/L (ref 0–37)
Albumin: 4.5 g/dL (ref 3.5–5.2)
Alkaline Phosphatase: 88 U/L (ref 39–117)
BUN: 12 mg/dL (ref 6–23)
CO2: 29 mEq/L (ref 19–32)
Calcium: 10.2 mg/dL (ref 8.4–10.5)
Chloride: 103 mEq/L (ref 96–112)
Creatinine, Ser: 0.95 mg/dL (ref 0.40–1.20)
GFR: 68.96 mL/min (ref >60.00)
Glucose, Bld: 92 mg/dL (ref 70–99)
Potassium: 4.2 mEq/L (ref 3.5–5.1)
Sodium: 138 mEq/L (ref 135–145)
Total Bilirubin: 1.1 mg/dL (ref 0.2–1.2)
Total Protein: 7.7 g/dL (ref 6.0–8.3)

## 2021-06-24 LAB — LIPID PANEL
Cholesterol: 177 mg/dL (ref 0–200)
HDL: 66.4 mg/dL (ref >39.00)
LDL Cholesterol: 101 mg/dL — ABNORMAL HIGH (ref 0–99)
NonHDL: 110.85
Total CHOL/HDL Ratio: 3
Triglycerides: 47 mg/dL (ref 0.0–149.0)
VLDL: 9.4 mg/dL (ref 0.0–40.0)

## 2021-06-24 LAB — VITAMIN D 25 HYDROXY (VIT D DEFICIENCY, FRACTURES): VITD: 44.6 ng/mL (ref 30.00–100.00)

## 2021-06-24 MED ORDER — VALSARTAN-HYDROCHLOROTHIAZIDE 160-25 MG PO TABS: 1.0000 | ORAL_TABLET | Freq: Every day | ORAL | 1 refills | Status: DC

## 2021-06-24 NOTE — Patient Instructions (Signed)
-  Nice seeing you today!!  -Lab work today; will notify you once results are available.  -Increase diovan HCT to 160/25 mg daily (may take 2 of current tabs, once you pick up new prescription, only 1 tab daily).  -Schedule follow up in 6 weeks with home BP measurements.

## 2021-06-24 NOTE — Telephone Encounter (Signed)
Noted  

## 2021-06-24 NOTE — Progress Notes (Signed)
Established Patient Office Visit     This visit occurred during the SARS-CoV-2 public health emergency.  Safety protocols were in place, including screening questions prior to the visit, additional usage of staff PPE, and extensive cleaning of exam room while observing appropriate contact time as indicated for disinfecting solutions.    CC/Reason for Visit: Follow-up chronic conditions  HPI: Kendra Gonzalez is a 51 y.o. female who is coming in today for the above mentioned reasons. Past Medical History is significant for: Hypertension, hypothyroidism, hyperlipidemia, impaired glucose tolerance and obesity.  At last visit she was started on Diovan HCT 80/12.5 mg for elevated blood pressure.  She states that at home her systolics are between 269 and 485 with diastolics in the mid 46E to 90s.  She is feeling well and has no acute concerns other than eye irritation that she believes is due to seasonal allergies with the spring season.  In December she was started on atorvastatin and is due to have levels rechecked, also due to have vitamin D levels rechecked.   Past Medical/Surgical History: Past Medical History:  Diagnosis Date   Allergy    Arthritis    Asthma    Cataract    bilateral eyes   Endometriosis    GERD (gastroesophageal reflux disease)    Headache    migraines   History of tetanus, diphtheria, and acellular pertussis booster vaccination (Tdap)    07/30/2010   Hyperlipidemia    Kidney stones    Pancreatitis     Past Surgical History:  Procedure Laterality Date   ABDOMINAL HYSTERECTOMY     arthroscopic knee surgery      carpel tunnel     CHOLECYSTECTOMY     ERCP  01/08/2012   Procedure: ENDOSCOPIC RETROGRADE CHOLANGIOPANCREATOGRAPHY (ERCP);  Surgeon: Beryle Beams, MD;  Location: [facility] ENDOSCOPY;  Service: Endoscopy;  Laterality: N/A;   HIP ARTHROPLASTY     left   HIP SURGERY     oophrectomy  2011   secondary to cysts   THYROIDECTOMY N/A 11/21/2020    Procedure: TOTAL THYROIDECTOMY;  Surgeon: Armandina Gemma, MD;  Location: [facility] ORS;  Service: General;  Laterality: N/A;   TUBAL LIGATION     UPPER GASTROINTESTINAL ENDOSCOPY  2016    Social History:  reports that she has never smoked. She has never used smokeless tobacco. She reports that she does not drink alcohol and does not use drugs.  Allergies: Allergies  Allergen Reactions   Hydromorphone Itching    Family History:  Family History  Problem Relation Age of Onset   Heart disease Daughter    Hypertension Father    Hypertension Sister    Hypertension Brother    Hypertension Maternal Grandfather    Heart disease Maternal Grandfather    Hypertension Paternal Grandfather    Hypertension Brother    Hypertension Sister    Thyroid disease Neg Hx    Colon polyps Neg Hx    Colon cancer Neg Hx    Esophageal cancer Neg Hx    Rectal cancer Neg Hx    Stomach cancer Neg Hx      Current Outpatient Medications:    acetaminophen (TYLENOL) 500 MG tablet, Take 1,000 mg by mouth every 8 (eight) hours as needed for moderate pain., Disp: , Rfl:    albuterol (PROVENTIL) (2.5 MG/3ML) 0.083% nebulizer solution, Take 3 mLs (2.5 mg total) by nebulization every 6 (six) hours as needed for wheezing or shortness of breath., Disp: 150 mL,  Rfl: 1   albuterol (VENTOLIN HFA) 108 (90 Base) MCG/ACT inhaler, Inhale 2 puffs into the lungs every 6 (six) hours as needed for wheezing or shortness of breath., Disp: 18 g, Rfl: 2   atorvastatin (LIPITOR) 40 MG tablet, Take 1 tablet (40 mg total) by mouth daily., Disp: 90 tablet, Rfl: 1   baclofen (LIORESAL) 10 MG tablet, Take 10 mg by mouth 2 (two) times daily as needed (migraines)., Disp: , Rfl:    cyclobenzaprine (FLEXERIL) 5 MG tablet, Take 1 tablet (5 mg total) by mouth at bedtime as needed for muscle spasms., Disp: 30 tablet, Rfl: 0   diclofenac (VOLTAREN) 75 MG EC tablet, Take 1 tablet (75 mg total) by mouth 2 (two) times daily., Disp: 30 tablet, Rfl: 0    diclofenac sodium (VOLTAREN) 1 % GEL, Apply up to three times a day as needed to large joint., Disp: 3 Tube, Rfl: 4   fluticasone-salmeterol (ADVAIR HFA) 115-21 MCG/ACT inhaler, Inhale 2 puffs into the lungs 2 (two) times daily., Disp: 1 each, Rfl: 2   levothyroxine (SYNTHROID) 137 MCG tablet, Take 1 tablet (137 mcg total) by mouth daily before breakfast., Disp: 90 tablet, Rfl: 3   magnesium gluconate (MAGONATE) 500 MG tablet, Take 500 mg by mouth at bedtime., Disp: , Rfl:    pantoprazole (PROTONIX) 40 MG tablet, Take 40 mg by mouth daily., Disp: , Rfl:    tiZANidine (ZANAFLEX) 2 MG tablet, Take 1-2 tablets (2-4 mg total) by mouth every 8 (eight) hours as needed for muscle spasms., Disp: 20 tablet, Rfl: 0   valsartan-hydrochlorothiazide (DIOVAN-HCT) 160-25 MG tablet, Take 1 tablet by mouth daily., Disp: 90 tablet, Rfl: 1   Vitamin D, Ergocalciferol, (DRISDOL) 1.25 MG (50000 UNIT) CAPS capsule, Take 1 capsule (50,000 Units total) by mouth every 7 (seven) days for 12 doses., Disp: 12 capsule, Rfl: 0   zonisamide (ZONEGRAN) 25 MG capsule, Take 100 mg by mouth at bedtime., Disp: , Rfl:    gabapentin (NEURONTIN) 300 MG capsule, Take 1 capsule (300 mg total) by mouth 3 (three) times daily., Disp: 90 capsule, Rfl: 0   promethazine (PHENERGAN) 25 MG tablet, Take 1 tablet (25 mg total) by mouth once for 1 dose., Disp: 2 tablet, Rfl: 0  Review of Systems:  Constitutional: Denies fever, chills, diaphoresis, appetite change and fatigue.  HEENT: Denies photophobia, eye pain, redness, hearing loss, ear pain, congestion, sore throat, rhinorrhea, sneezing, mouth sores, trouble swallowing, neck pain, neck stiffness and tinnitus.   Respiratory: Denies SOB, DOE, cough, chest tightness,  and wheezing.   Cardiovascular: Denies chest pain, palpitations and leg swelling.  Gastrointestinal: Denies nausea, vomiting, abdominal pain, diarrhea, constipation, blood in stool and abdominal distention.  Genitourinary: Denies  dysuria, urgency, frequency, hematuria, flank pain and difficulty urinating.  Endocrine: Denies: hot or cold intolerance, sweats, changes in hair or nails, polyuria, polydipsia. Musculoskeletal: Denies myalgias, back pain, joint swelling, arthralgias and gait problem.  Skin: Denies pallor, rash and wound.  Neurological: Denies dizziness, seizures, syncope, weakness, light-headedness, numbness and headaches.  Hematological: Denies adenopathy. Easy bruising, personal or family bleeding history  Psychiatric/Behavioral: Denies suicidal ideation, mood changes, confusion, nervousness, sleep disturbance and agitation    Physical Exam: Vitals:   06/24/21 0830 06/24/21 0858  BP: 120/90 130/90  Pulse: 70   Temp: 98 F (36.7 C)   TempSrc: Oral   SpO2: 99%   Weight: 210 lb 3.2 oz (95.3 kg)     Body mass index is 36.08 kg/m.   Constitutional: NAD, calm,  comfortable Eyes: PERRL, lids and conjunctivae normal ENMT: Mucous membranes are moist.  Respiratory: clear to auscultation bilaterally, no wheezing, no crackles. Normal respiratory effort. No accessory muscle use.  Cardiovascular: Regular rate and rhythm, no murmurs / rubs / gallops. No extremity edema.  Neurologic: Grossly intact and nonfocal Psychiatric: Normal judgment and insight. Alert and oriented x 3. Normal mood.    Impression and Plan:  Vitamin D deficiency  - Plan: VITAMIN D 25 Hydroxy (Vit-D Deficiency, Fractures)  Hyperlipidemia, unspecified hyperlipidemia type  - Plan: Lipid panel -She was started on atorvastatin 40 mg 3 months ago due to an LDL of 170.  Encounter for screening mammogram for malignant neoplasm of breast  - Plan: MM Digital Screening  Primary hypertension  - Plan: Comprehensive metabolic panel, valsartan-hydrochlorothiazide (DIOVAN-HCT) 160-25 MG tablet -2 separate in office measurements today are 120/90, 130/90.  Increase Diovan HCT to 160/25 mg and return in 6 weeks for follow-up.  Time spent: 31  minutes reviewing chart, interviewing and examining patient and formulating plan of care.   Patient Instructions  -Nice seeing you today!!  -Lab work today; will notify you once results are available.  -Increase diovan HCT to 160/25 mg daily (may take 2 of current tabs, once you pick up new prescription, only 1 tab daily).  -Schedule follow up in 6 weeks with home BP measurements.    Lelon Frohlich, MD [facility]

## 2021-07-04 NOTE — Progress Notes (Signed)
NEUROLOGY CONSULTATION NOTE  Kendra Gonzalez MRN: [id number] DOB: 26-May-1970  Referring provider: Lelon Frohlich, MD Primary care provider: Lelon Frohlich, MD  Reason for consult:  headache  Assessment/Plan:   Migraine without aura, without status migrainosus, intractable Cervicalgia  PT for neck pain Migraine prevention:  Emgality.  Due to HTN, would not use Aimovig Migraine rescue:  Maxalt '10mg'$  and Zofran '4mg'$ .  Stop Excedrin Limit use of pain relievers to no more than 2 days out of week to prevent risk of rebound or medication-overuse headache. Keep headache diary Follow up 6 months    Subjective:  Kendra Gonzalez is a 51 year old female with hypothyroidism, HTN, asthma, cataracts and history of kidney stones who presents for headaches.  History supplemented by referring provider's note.  Onset:  52 years old Location:  starts in back of neck and radiates up to behind either eye (usually left side) Quality:  pressure Intensity:  10/10 Aura:  absent Prodrome:  absent Associated symptoms:  Nausea, photophobia, phonophobia, osmophobia, blurred vision.  She denies associated vomiting, unilateral numbness or weakness. Duration:  2-3 days Frequency:  1 to 2 times a week Frequency of abortive medication: Excedrin 3 days a week Triggers:  scents (perfumes), seasonal allergies Relieving factors:  resting in dark quiet room Activity:  aggravates  MRI of brain with and without contrast on 07/18/2019 personally reviewed showed minimal nonspecific T2 signal changes in the cerebral white matter and partially empty sella.  MRI of c-spine on 08/19/2019 personally reviewed revealed mild multilevel cervical spondylosis with mild neural foraminal narrowing but without significant spinal canal stenosis or cord impingement   Current NSAIDS/analgesics:  Excedrin Migraine Current triptans:  none Current ergotamine:  none Current anti-emetic:  none Current  muscle relaxants:   Current Antihypertensive medications:  valsartan-HCTZ Current Antidepressant medications:  none Current Anticonvulsant medications:  none Current anti-CGRP:  none Current Vitamins/Herbal/Supplements:  magnesium gluconate Current Antihistamines/Decongestants:  none Other therapy:  none Hormone/birth control:  none   Past NSAIDS/analgesics:  Fioricet, ibuprofen, indomethacin, meloxicam, diclofenac '75mg'$ , naproxen, tramadol, acetaminophen Past abortive triptans:  rizatriptan, sumatriptan '50mg'$  Past abortive ergotamine:  none Past muscle relaxants:  baclofen, cyclobenzaprine '5mg'$  QHS (muscle spasms), tizanidine 2-'4mg'$  PRN (muscle spasms) Past anti-emetic:  Zofran, promethazine Past antihypertensive medications:  none Past antidepressant medications:  venlafaxine Past anticonvulsant medications:  zonisamide, gabapentin, pregablin Past anti-CGRP:  none Past vitamins/Herbal/Supplements:  none Past antihistamines/decongestants:  Xyzal Other past therapies:  none  Caffeine:  No coffee.  May drink tea 2 days a week Alcohol:  No Smoker:  No Diet:  four 16 oz water bottles daily.  No soda.  Does not skip meals Exercise:  walks or runs 30 minutes Depression:  no; Anxiety:  no Other pain:  left hip pain Sleep hygiene:  10 PM to 6 AM.  Toss and turns due to hip pain Family history of headache:  none      PAST MEDICAL HISTORY: Past Medical History:  Diagnosis Date   Allergy    Arthritis    Asthma    Cataract    bilateral eyes   Endometriosis    GERD (gastroesophageal reflux disease)    Headache    migraines   History of tetanus, diphtheria, and acellular pertussis booster vaccination (Tdap)    07/30/2010   Hyperlipidemia    Kidney stones    Pancreatitis     PAST SURGICAL HISTORY: Past Surgical History:  Procedure Laterality Date   ABDOMINAL HYSTERECTOMY  arthroscopic knee surgery      carpel tunnel     CHOLECYSTECTOMY     ERCP  01/08/2012   Procedure:  ENDOSCOPIC RETROGRADE CHOLANGIOPANCREATOGRAPHY (ERCP);  Surgeon: Beryle Beams, MD;  Location[facility] ENDOSCOPY;  Service: Endoscopy;  Laterality: N/A;   HIP ARTHROPLASTY     left   HIP SURGERY     oophrectomy  2011   secondary to cysts   THYROIDECTOMY N/A 11/21/2020   Procedure: TOTAL THYROIDECTOMY;  Surgeon: Armandina Gemma, MD;  Location: [facility] ORS;  Service: General;  Laterality: N/A;   TUBAL LIGATION     UPPER GASTROINTESTINAL ENDOSCOPY  2016    MEDICATIONS: Current Outpatient Medications on File Prior to Visit  Medication Sig Dispense Refill   acetaminophen (TYLENOL) 500 MG tablet Take 1,000 mg by mouth every 8 (eight) hours as needed for moderate pain.     albuterol (PROVENTIL) (2.5 MG/3ML) 0.083% nebulizer solution Take 3 mLs (2.5 mg total) by nebulization every 6 (six) hours as needed for wheezing or shortness of breath. 150 mL 1   albuterol (VENTOLIN HFA) 108 (90 Base) MCG/ACT inhaler Inhale 2 puffs into the lungs every 6 (six) hours as needed for wheezing or shortness of breath. 18 g 2   atorvastatin (LIPITOR) 40 MG tablet Take 1 tablet (40 mg total) by mouth daily. 90 tablet 1   baclofen (LIORESAL) 10 MG tablet Take 10 mg by mouth 2 (two) times daily as needed (migraines).     cyclobenzaprine (FLEXERIL) 5 MG tablet Take 1 tablet (5 mg total) by mouth at bedtime as needed for muscle spasms. 30 tablet 0   diclofenac (VOLTAREN) 75 MG EC tablet Take 1 tablet (75 mg total) by mouth 2 (two) times daily. 30 tablet 0   diclofenac sodium (VOLTAREN) 1 % GEL Apply up to three times a day as needed to large joint. 3 Tube 4   fluticasone-salmeterol (ADVAIR HFA) 115-21 MCG/ACT inhaler Inhale 2 puffs into the lungs 2 (two) times daily. 1 each 2   gabapentin (NEURONTIN) 300 MG capsule Take 1 capsule (300 mg total) by mouth 3 (three) times daily. 90 capsule 0   levothyroxine (SYNTHROID) 137 MCG tablet Take 1 tablet (137 mcg total) by mouth daily before breakfast. 90 tablet 3   magnesium gluconate  (MAGONATE) 500 MG tablet Take 500 mg by mouth at bedtime.     pantoprazole (PROTONIX) 40 MG tablet Take 40 mg by mouth daily.     promethazine (PHENERGAN) 25 MG tablet Take 1 tablet (25 mg total) by mouth once for 1 dose. 2 tablet 0   tiZANidine (ZANAFLEX) 2 MG tablet Take 1-2 tablets (2-4 mg total) by mouth every 8 (eight) hours as needed for muscle spasms. 20 tablet 0   valsartan-hydrochlorothiazide (DIOVAN-HCT) 160-25 MG tablet Take 1 tablet by mouth daily. 90 tablet 1   Vitamin D, Ergocalciferol, (DRISDOL) 1.25 MG (50000 UNIT) CAPS capsule Take 1 capsule (50,000 Units total) by mouth every 7 (seven) days for 12 doses. 12 capsule 0   zonisamide (ZONEGRAN) 25 MG capsule Take 100 mg by mouth at bedtime.     [DISCONTINUED] methimazole (TAPAZOLE) 10 MG tablet Take 1 tablet (10 mg total) by mouth 2 (two) times daily. (Patient not taking: Reported on 10/29/2020) 180 tablet 3   No current facility-administered medications on file prior to visit.    ALLERGIES: Allergies  Allergen Reactions   Hydromorphone Itching    FAMILY HISTORY: Family History  Problem Relation Age of Onset   Heart disease  Daughter    Hypertension Father    Hypertension Sister    Hypertension Brother    Hypertension Maternal Grandfather    Heart disease Maternal Grandfather    Hypertension Paternal Grandfather    Hypertension Brother    Hypertension Sister    Thyroid disease Neg Hx    Colon polyps Neg Hx    Colon cancer Neg Hx    Esophageal cancer Neg Hx    Rectal cancer Neg Hx    Stomach cancer Neg Hx     Objective:  Blood pressure 127/85, pulse 91, height '5\' 4"'$  (1.626 m), weight 207 lb 3.2 oz (94 kg), last menstrual period 06/06/1996, SpO2 100 %. General: No acute distress.  Patient appears well-groomed.   Head:  Normocephalic/atraumatic Eyes:  fundi examined but not visualized Neck: supple, left suboccipital/ upper cervical paraspinal tenderness, full range of motion Neurological Exam: Mental status: alert  and oriented to person, place, and time, recent and remote memory intact, fund of knowledge intact, attention and concentration intact, speech fluent and not dysarthric, language intact. Cranial nerves: CN I: not tested CN II: pupils equal, round and reactive to light, visual fields intact CN III, IV, VI:  full range of motion, no nystagmus, no ptosis CN V: facial sensation intact. CN VII: upper and lower face symmetric CN VIII: hearing intact CN IX, X: gag intact, uvula midline CN XI: sternocleidomastoid and trapezius muscles intact CN XII: tongue midline Bulk & Tone: normal, no fasciculations. Motor:  muscle strength 5/5 throughout Sensation:  Pinprick, temperature and vibratory sensation intact. Deep Tendon Reflexes:  2+ throughout,  toes downgoing.   Finger to nose testing:  Without dysmetria.   Heel to shin:  Without dysmetria.   Gait:  Normal station and stride.  Romberg negative.    Thank you for allowing me to take part in the care of this patient.  Metta Clines, DO  CC: Matthew Saras, MD

## 2021-07-07 ENCOUNTER — Other Ambulatory Visit: Payer: Self-pay

## 2021-07-07 ENCOUNTER — Ambulatory Visit: Payer: 59 | Admitting: Neurology

## 2021-07-07 ENCOUNTER — Encounter: Payer: Self-pay | Admitting: Neurology

## 2021-07-07 VITALS — BP 127/85 | HR 91 | Ht 64.0 in | Wt 207.2 lb

## 2021-07-07 DIAGNOSIS — M542 Cervicalgia: Secondary | ICD-10-CM | POA: Diagnosis not present

## 2021-07-07 DIAGNOSIS — G43019 Migraine without aura, intractable, without status migrainosus: Secondary | ICD-10-CM

## 2021-07-07 MED ORDER — EMGALITY 120 MG/ML ~~LOC~~ SOAJ: 240.0000 mg | Freq: Once | SUBCUTANEOUS | 0 refills | Status: AC

## 2021-07-07 MED ORDER — RIZATRIPTAN BENZOATE 10 MG PO TBDP: 10.0000 mg | ORAL_TABLET | ORAL | 0 refills | Status: DC | PRN

## 2021-07-07 MED ORDER — ONDANSETRON 4 MG PO TBDP
4.0000 mg | ORAL_TABLET | Freq: Three times a day (TID) | ORAL | 5 refills | Status: DC | PRN
Start: 2021-07-07 — End: 2021-11-05

## 2021-07-07 MED ORDER — EMGALITY 120 MG/ML ~~LOC~~ SOAJ: 120.0000 mg | SUBCUTANEOUS | 11 refills | Status: DC

## 2021-07-07 NOTE — Patient Instructions (Signed)
?  Refer to physical therapy for neck pain ?Start Emgality 2 injections for first dose, then 1 injection every 28 days thereafter.Kendra Gonzalez   ?Take rizatriptan at earliest onset of headache.  May repeat dose once in 2 hours if needed.  Maximum 2 tablets in 24 hours.  Stop [name] ?Ondansetron for nausea ?Limit use of pain relievers to no more than 2 days out of the week.  These medications include acetaminophen, NSAIDs (ibuprofen/Advil/Motrin, naproxen/Aleve, triptans (Imitrex/sumatriptan), Excedrin, and narcotics.  This will help reduce risk of rebound headaches. ?Be aware of common food triggers: ? - Caffeine:  coffee, black tea, cola, Mt. Dew ? - Chocolate ? - Dairy:  aged cheeses (brie, blue, cheddar, gouda, [place], provolone, [place], Swiss, etc), chocolate milk, buttermilk, sour cream, limit eggs and yogurt ? - Nuts, peanut butter ? - Alcohol ? - Cereals/grains:  FRESH breads (fresh bagels, sourdough, doughnuts), yeast productions ? - Processed/canned/aged/cured meats (pre-packaged deli meats, hotdogs) ? - MSG/glutamate:  soy sauce, flavor enhancer, pickled/preserved/marinated foods ? - Sweeteners:  aspartame (Equal, Nutrasweet).  Sugar and Splenda are okay ? - Vegetables:  legumes (lima beans, lentils, snow peas, fava beans, pinto peans, peas, garbanzo beans), sauerkraut, onions, olives, pickles ? - Fruit:  avocados, bananas, citrus fruit (orange, lemon, grapefruit), mango ? - Other:  Frozen meals, macaroni and cheese ?Routine exercise ?Stay adequately hydrated (aim for 64 oz water daily) ?Keep headache diary ?Maintain proper stress management ?Maintain proper sleep hygiene ?Do not skip meals ?Consider supplements:  magnesium citrate '400mg'$  daily, riboflavin '400mg'$  daily, coenzyme Q10 '100mg'$  three times daily. ? ?

## 2021-07-07 NOTE — Progress Notes (Signed)
Kendra Gonzalez (Key: [id number]) ? ?[employer] is reviewing your PA request. Typically an electronic response will be received within 24-72 hours. To check for an update later, open this request from your dashboard. ? ?You may close this dialog and return to your dashboard to perform other tasks. ?

## 2021-07-10 ENCOUNTER — Other Ambulatory Visit: Payer: Self-pay

## 2021-07-10 ENCOUNTER — Ambulatory Visit: Payer: 59 | Attending: Neurology

## 2021-07-10 DIAGNOSIS — G4486 Cervicogenic headache: Secondary | ICD-10-CM | POA: Insufficient documentation

## 2021-07-10 DIAGNOSIS — M542 Cervicalgia: Secondary | ICD-10-CM | POA: Insufficient documentation

## 2021-07-10 NOTE — Therapy (Signed)
?OUTPATIENT PHYSICAL THERAPY CERVICAL EVALUATION ? ? ?Patient Name: Kendra Gonzalez ?MRN: [id number] ?DOB:[birth year], 51 y.o., female ?Today's Date: 07/10/2021 ? ? PT End of Session - 07/10/21 1301   ? ? Visit Number 1   ? Number of Visits 5   ? Date for PT Re-Evaluation 08/07/21   ? Authorization Type [employer]   ? Progress Note Due on Visit 5   ? PT Start Time 1215   ? PT Stop Time 1300   ? PT Time Calculation (min) 45 min   ? Activity Tolerance Patient tolerated treatment well   ? Behavior During Therapy [facility] for tasks assessed/performed   ? ?  ?  ? ?  ? ? ?Past Medical History:  ?Diagnosis Date  ? Allergy   ? Arthritis   ? Asthma   ? Cataract   ? bilateral eyes  ? Endometriosis   ? GERD (gastroesophageal reflux disease)   ? Headache   ? migraines  ? History of tetanus, diphtheria, and acellular pertussis booster vaccination (Tdap)   ? 07/30/2010  ? Hyperlipidemia   ? Kidney stones   ? Pancreatitis   ? ?Past Surgical History:  ?Procedure Laterality Date  ? ABDOMINAL HYSTERECTOMY    ? arthroscopic knee surgery     ? carpel tunnel    ? CHOLECYSTECTOMY    ? ERCP  01/08/2012  ? Procedure: ENDOSCOPIC RETROGRADE CHOLANGIOPANCREATOGRAPHY (ERCP);  Surgeon: Beryle Beams, MD;  Location: [facility] ENDOSCOPY;  Service: Endoscopy;  Laterality: N/A;  ? HIP ARTHROPLASTY    ? left  ? HIP SURGERY    ? oophrectomy  2011  ? secondary to cysts  ? THYROIDECTOMY N/A 11/21/2020  ? Procedure: TOTAL THYROIDECTOMY;  Surgeon: Armandina Gemma, MD;  Location: [facility] ORS;  Service: General;  Laterality: N/A;  ? TUBAL LIGATION    ? UPPER GASTROINTESTINAL ENDOSCOPY  2016  ? ?Patient Active Problem List  ? Diagnosis Date Noted  ? Hypothyroidism 05/16/2021  ? Vitamin D deficiency 04/18/2021  ? IGT (impaired glucose tolerance) 04/18/2021  ? Hypercalcemia 02/24/2021  ? 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 Unilateral primary osteoarthritis, left knee 07/16/2020  ? Presence of left artificial hip joint 07/09/2017  ? Multinodular goiter 04/17/2015  ? History of  stomach ulcers 03/22/2015  ? Chronic pain of left heel 04/23/2014  ? Chronic pain syndrome 03/19/2014  ? Asthma 01/04/2013  ? BMI 29.0-29.9,adult 10/12/2011  ? Paroxysmal nerve pain 01/22/2011  ? Hyperlipidemia 02/16/2007  ? GERD 02/16/2007  ? ? ?PCP: Isaac Bliss, Rayford Halsted, MD ? ?REFERRING PROVIDER: Pieter Partridge, DO ? ?REFERRING DIAG: M54.2 (ICD-10-CM) - Cervicalgia Onset:   ? ?THERAPY DIAG:  ?Cervicalgia ? ?Cervicogenic headache ? ?ONSET DATE: 51 years old  ? ?SUBJECTIVE:                                                                                                                                                                                                        ? ?  SUBJECTIVE STATEMENT: ?20 year history of cervical pain and migraine headaches ? ?PERTINENT HISTORY:  ?History of migraines ? ?PAIN:  ?Are you having pain? Yes, 3/10 best 10/10 worst, triggered by lights, allergies smells ?PRECAUTIONS: Other: migraines ? ?WEIGHT BEARING RESTRICTIONS No ? ?FALLS:  ?Has patient fallen in last 6 months? No ?Number of falls: 0 ? ?LIVING ENVIRONMENT: ?Lives with: lives with their family ?Lives in: House/apartment ? ? ?OCCUPATION: CNA ? ?PLOF: Independent ? ?PATIENT GOALS Reduce my pain intensity ? ?OBJECTIVE:  ? ?DIAGNOSTIC FINDINGS:  ?None noted ? ?PATIENT SURVEYS:  ?FOTO 60 ? ? ?COGNITION: ?Overall cognitive status: Within functional limits for tasks assessed ? ? ?SENSATION: ?WFL ? ?POSTURE:  ?Rounded, forward shoulders ? ?PALPATION: ?Global tenderness through cervical and upper thoracic musculature  ? ?CERVICAL ROM:  ? ?Active ROM A/PROM (deg) ?07/10/2021  ?Flexion 75%*  ?Extension 33%  ?Right lateral flexion 50%  ?Left lateral flexion 75%  ?Right rotation 75%  ?Left rotation 75%  ? * pain ? ?UE ROM: ? ?Active ROM Right ?07/10/2021 Left ?07/10/2021  ?Shoulder flexion [facility] WFL  ?Shoulder extension [facility] WFL  ?Shoulder abduction [facility] WFL  ?Shoulder adduction    ?Shoulder extension    ?Shoulder internal rotation [facility] WFL   ?Shoulder external rotation [facility] WFL  ?Elbow flexion [facility] WFL  ?Elbow extension [facility] WFL  ?Wrist flexion    ?Wrist extension    ?Wrist ulnar deviation    ?Wrist radial deviation    ?Wrist pronation    ?Wrist supination    ?Grip strength  WFL WFL  ? (Blank rows = not tested) ? ?UE MMT: ? ?MMT Right ?07/10/2021 Left ?07/10/2021  ?Shoulder flexion [facility] WFL  ?Shoulder extension [facility] WFL  ?Shoulder abduction [facility] WFL  ?Shoulder adduction    ?Shoulder extension    ?Shoulder internal rotation [facility] WFL  ?Shoulder external rotation [facility] WFL  ?Middle trapezius    ?Lower trapezius    ?Elbow flexion    ?Elbow extension    ?Wrist flexion    ?Wrist extension    ?Wrist ulnar deviation    ?Wrist radial deviation    ?Wrist pronation    ?Wrist supination    ?Grip strength    ? (Blank rows = not tested) ? ?CERVICAL SPECIAL TESTS:  ?Neck flexor muscle endurance test: Negative 30s hold time ? ? ?FUNCTIONAL TESTS:  ?N/a ? ?PATIENT SURVEYS:  ?FOTO 74 ? ?TODAY'S TREATMENT:  ?Eval and HEP ? ? ?PATIENT EDUCATION:  ?Education details: Discussed eval findings, rehab rationale and POC and patient is in agreement ?Person educated: Patient ?Education method: Explanation, Demonstration, and Handouts ?Education comprehension: verbalized understanding, returned demonstration, and needs further education ? ? ?HOME EXERCISE PROGRAM: ?Access Code: [email or web address] ?URL: [email or web address] ?Date: 07/10/2021 ?Prepared by: Sharlynn Oliphant ? ?Exercises ?Supine Chin Tuck - 2 x daily - 7 x weekly - 1 sets - 10 reps - 3s hold ?Seated Cervical Retraction - 2 x daily - 7 x weekly - 1 sets - 10 reps - 3s hold ?Seated Scapular Retraction - 2 x daily - 7 x weekly - 1 sets - 10 reps - 3s hold ?Doorway Pec Stretch at 90 Degrees Abduction - 2 x daily - 7 x weekly - 1 sets - 3 reps - 30s hold ? ?Patient Education ?Forward Head Posture ? ?ASSESSMENT: ? ?CLINICAL IMPRESSION: ?Patient is a 51 y.o. female who was seen today for physical therapy evaluation and treatment for  cervicalgia and HAs. She has full BUE ROM  and strength, postural dysfunctions of forward  head and rounded shoulders noted, no radicular symptoms reported, cervical ROM restrictions found, most prominent in extension due to posture. ? ? ?OBJECTIVE IMPAIRMENTS decreased activity tolerance, decreased knowledge of condition, decreased ROM, increased fascial restrictions, postural dysfunction, and pain.  ? ?PERSONAL FACTORS Behavior pattern, Fitness, Past/current experiences, Time since onset of injury/illness/exacerbation, and migraines  are also affecting patient's functional outcome.  ? ? ?REHAB POTENTIAL: Good ? ?CLINICAL DECISION MAKING: Stable/uncomplicated ? ?EVALUATION COMPLEXITY: Low ? ? ?GOALS: ?Goals reviewed with patient? Yes ? ?SHORT TERM GOALS: Target date: 07/24/2021 ? ?Patient to demonstrate independence in HEP  ?Baseline: [id number] ?Goal status: INITIAL ? ?2.  Patient to demo good postural alignment against wall ?Baseline: forward head and rounded shoulders ?Goal status: INITIAL ? ? ?LONG TERM GOALS: Target date: 08/07/2021 ? ?2/10 resting pain ?Baseline: 3/10 ?Goal status: INITIAL ? ?2.  Increase FOTO score to 72 ?Baseline: 70 ?Goal status: INITIAL ? ?3.  Increase cervical AROM to 50% extension and 75% RSB ?Baseline: 3% and 50% respectively ?Goal status: INITIAL ? ? ?PLAN: ?PT FREQUENCY: 1-2x/week ? ?PT DURATION: 4 weeks ? ?PLANNED INTERVENTIONS: Therapeutic exercises, Therapeutic activity, Neuromuscular re-education, Balance training, Gait training, Patient/Family education, Joint mobilization, Dry Needling, and Manual therapy ? ?PLAN FOR NEXT SESSION: HEP review and update, postural training, manual techniques, TPDN as appropriate, posterior shoulder strengthening ? ? ?Lanice Shirts, PT ?07/10/2021, 1:04 PM ? ? ? ?  ?

## 2021-07-11 ENCOUNTER — Telehealth: Payer: Self-pay | Admitting: Neurology

## 2021-07-11 NOTE — Telephone Encounter (Signed)
Patient called and stated the pharmacy had sent a prior authorization request for emgality. ?

## 2021-07-14 NOTE — Progress Notes (Signed)
Kendra Gonzalez (Key: [id number]) ?

## 2021-07-15 NOTE — Telephone Encounter (Signed)
Appeal started. 

## 2021-07-16 ENCOUNTER — Ambulatory Visit: Payer: 59

## 2021-07-17 ENCOUNTER — Telehealth: Payer: Self-pay | Admitting: Neurology

## 2021-07-17 NOTE — Telephone Encounter (Signed)
Pt called in and left a message. She stated one of the medications she was prescribed was denied.  ?

## 2021-07-17 NOTE — Telephone Encounter (Signed)
Pt called with an update that an appeal was started and that we will call her next week with more information. ?

## 2021-07-22 NOTE — Telephone Encounter (Signed)
Appeal still pending. Pt advised. ?Dr.Jaffe please advise of next steps.  ?

## 2021-07-23 ENCOUNTER — Other Ambulatory Visit: Payer: 59

## 2021-07-23 ENCOUNTER — Ambulatory Visit: Payer: 59

## 2021-07-23 DIAGNOSIS — G4486 Cervicogenic headache: Secondary | ICD-10-CM

## 2021-07-23 DIAGNOSIS — M542 Cervicalgia: Secondary | ICD-10-CM | POA: Diagnosis not present

## 2021-07-23 NOTE — Therapy (Addendum)
OUTPATIENT PHYSICAL THERAPY TREATMENT NOTE/DC SUMMARY   Patient Name: Kendra Gonzalez MRN: [id number] DOB:27-Oct-1970, 51 y.o., female Today's Date: 07/23/2021  PCP: Isaac Bliss, Rayford Halsted, MD REFERRING PROVIDER: Pieter Partridge, DO PHYSICAL THERAPY DISCHARGE SUMMARY  Visits from Start of Care: 2  Current functional level related to goals / functional outcomes: Goals partially met   Remaining deficits: UTA   Education / Equipment: HEP   Patient agrees to discharge. Patient goals were partially met. Patient is being discharged due to not returning since the last visit.   PT End of Session - 07/23/21 1217     Visit Number 2    Number of Visits 5    Date for PT Re-Evaluation 08/07/21    Authorization Type [employer]    Progress Note Due on Visit 5    PT Start Time 1215    PT Stop Time 1255    PT Time Calculation (min) 40 min    Activity Tolerance Patient tolerated treatment well    Behavior During Therapy WFL for tasks assessed/performed             Past Medical History:  Diagnosis Date   Allergy    Arthritis    Asthma    Cataract    bilateral eyes   Endometriosis    GERD (gastroesophageal reflux disease)    Headache    migraines   History of tetanus, diphtheria, and acellular pertussis booster vaccination (Tdap)    07/30/2010   Hyperlipidemia    Kidney stones    Pancreatitis    Past Surgical History:  Procedure Laterality Date   ABDOMINAL HYSTERECTOMY     arthroscopic knee surgery      carpel tunnel     CHOLECYSTECTOMY     ERCP  01/08/2012   Procedure: ENDOSCOPIC RETROGRADE CHOLANGIOPANCREATOGRAPHY (ERCP);  Surgeon: Beryle Beams, MD;  Location: [facility] ENDOSCOPY;  Service: Endoscopy;  Laterality: N/A;   HIP ARTHROPLASTY     left   HIP SURGERY     oophrectomy  2011   secondary to cysts   THYROIDECTOMY N/A 11/21/2020   Procedure: TOTAL THYROIDECTOMY;  Surgeon: Armandina Gemma, MD;  Location: [facility] ORS;  Service: General;  Laterality: N/A;   TUBAL LIGATION      UPPER GASTROINTESTINAL ENDOSCOPY  2016   Patient Active Problem List   Diagnosis Date Noted   Hypothyroidism 05/16/2021   Vitamin D deficiency 04/18/2021   IGT (impaired glucose tolerance) 04/18/2021   Hypercalcemia 02/24/2021   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   Unilateral primary osteoarthritis, left knee 07/16/2020   Presence of left artificial hip joint 07/09/2017   Multinodular goiter 04/17/2015   History of stomach ulcers 03/22/2015   Chronic pain of left heel 04/23/2014   Chronic pain syndrome 03/19/2014   Asthma 01/04/2013   BMI 29.0-29.9,adult 10/12/2011   Paroxysmal nerve pain 01/22/2011   Hyperlipidemia 02/16/2007   GERD 02/16/2007    REFERRING DIAG: M54.2 (ICD-10-CM) - Cervicalgia Onset:    THERAPY DIAG: Cervicalgia   Cervicogenic headache   PERTINENT HISTORY: History of migraines  PRECAUTIONS: migraines  SUBJECTIVE: Feels good today, symptoms flare up at work  PAIN:  Are you having pain? No      OBJECTIVE:    DIAGNOSTIC FINDINGS:  None noted   PATIENT SURVEYS:  FOTO 70     COGNITION: Overall cognitive status: Within functional limits for tasks assessed     SENSATION: WFL   POSTURE:  Rounded, forward shoulders   PALPATION: Global tenderness through cervical and  upper thoracic musculature    CERVICAL ROM:    Active ROM A/PROM (deg) 07/10/2021  Flexion 75%*  Extension 33%  Right lateral flexion 50%  Left lateral flexion 75%  Right rotation 75%  Left rotation 75%   * pain   UE ROM:   Active ROM Right 07/10/2021 Left 07/10/2021  Shoulder flexion [facility]  Shoulder extension [facility]  Shoulder abduction [facility]  Shoulder adduction      Shoulder extension      Shoulder internal rotation [facility]  Shoulder external rotation [facility] WFL  Elbow flexion [facility] WFL  Elbow extension [facility] WFL  Wrist flexion      Wrist extension      Wrist ulnar deviation      Wrist radial deviation      Wrist pronation      Wrist supination       Grip strength  WFL WFL   (Blank rows = not tested)   UE MMT:   MMT Right 07/10/2021 Left 07/10/2021  Shoulder flexion [facility] WFL  Shoulder extension [facility] WFL  Shoulder abduction [facility] WFL  Shoulder adduction      Shoulder extension      Shoulder internal rotation [facility] WFL  Shoulder external rotation [facility] WFL  Middle trapezius      Lower trapezius      Elbow flexion      Elbow extension      Wrist flexion      Wrist extension      Wrist ulnar deviation      Wrist radial deviation      Wrist pronation      Wrist supination      Grip strength       (Blank rows = not tested)   CERVICAL SPECIAL TESTS:  Neck flexor muscle endurance test: Negative 30s hold time     FUNCTIONAL TESTS:  N/a   PATIENT SURVEYS:  FOTO 53   TODAY'S TREATMENT:   [facility] Adult PT Treatment:                                                DATE: 07/23/21 Therapeutic Exercise: UBE L1 3/3 Seated chin tucks 10x  Scapular retractions 10x Supine chin tucks 10x Prone Ws 15x Prone shoulder flexion 15x Prone shoulder extension 15x Prone scapular retractions 15x Manual Therapy: SO traction Upper thoracic PA mobs T1-4,  Upper thoracic lower cervical rotational mobs into R rotation    PATIENT EDUCATION:  Education details: Discussed eval findings, rehab rationale and POC and patient is in agreement Person educated: Patient Education method: Explanation, Demonstration, and Handouts Education comprehension: verbalized understanding, returned demonstration, and needs further education     HOME EXERCISE PROGRAM: Access Code: [email or web address] URL: [email or web address] Date: 07/23/2021 Prepared by: Sharlynn Oliphant  Exercises - Seated Thoracic Lumbar Extension  - 2 x daily - 7 x weekly - 1 sets - 10 reps - Prone Scapular Slide with Shoulder Extension  - 2 x daily - 7 x weekly - 2 sets - 15 reps - 3s hold - Prone W Scapular Retraction  - 2 x daily - 7 x weekly - 2 sets - 15 reps - 3s hold - Prone Scapular  Retraction with Hands Behind Head  - 2 x daily - 7 x weekly - 2 sets - 15 reps - 3s hold  Patient Education -  Office Posture   ASSESSMENT:   CLINICAL IMPRESSION: Onset of HA during SO traction requiring a brief rest period.  Continued soft tissue and postural dysfunction.  Added prone postural and posterior shoulder strengthening tasks.  Regained 75% R cervical rotation following manual    OBJECTIVE IMPAIRMENTS decreased activity tolerance, decreased knowledge of condition, decreased ROM, increased fascial restrictions, postural dysfunction, and pain.    PERSONAL FACTORS Behavior pattern, Fitness, Past/current experiences, Time since onset of injury/illness/exacerbation, and migraines  are also affecting patient's functional outcome.      REHAB POTENTIAL: Good   CLINICAL DECISION MAKING: Stable/uncomplicated   EVALUATION COMPLEXITY: Low     GOALS: Goals reviewed with patient? Yes   SHORT TERM GOALS: Target date: 07/24/2021   Patient to demonstrate independence in HEP  Baseline: [id number] Goal status: INITIAL   2.  Patient to demo good postural alignment against wall Baseline: forward head and rounded shoulders Goal status: INITIAL     LONG TERM GOALS: Target date: 08/07/2021   2/10 resting pain Baseline: 3/10 Goal status: INITIAL   2.  Increase FOTO score to 72 Baseline: 70 Goal status: INITIAL   3.  Increase cervical AROM to 50% extension and 75% RSB Baseline: 3% and 50% respectively Goal status: INITIAL     PLAN: PT FREQUENCY: 1-2x/week   PT DURATION: 4 weeks   PLANNED INTERVENTIONS: Therapeutic exercises, Therapeutic activity, Neuromuscular re-education, Balance training, Gait training, Patient/Family education, Joint mobilization, Dry Needling, and Manual therapy   PLAN FOR NEXT SESSION: HEP review and update, postural training, manual techniques, TPDN as appropriate, posterior shoulder strengthening    Lanice Shirts, PT 07/23/2021, 12:18 PM

## 2021-07-24 ENCOUNTER — Encounter: Payer: Self-pay | Admitting: Internal Medicine

## 2021-07-24 NOTE — Telephone Encounter (Signed)
Pt is calling back and want you to give her a call back . ?

## 2021-07-25 MED ORDER — PROPRANOLOL HCL 60 MG PO TABS
60.0000 mg | ORAL_TABLET | Freq: Three times a day (TID) | ORAL | 0 refills | Status: DC
Start: 2021-07-25 — End: 2022-01-12

## 2021-07-25 NOTE — Telephone Encounter (Signed)
Pt advised Dr.Jaffe recommended starting  propranolol ER '60mg'$  daily for migraine prevention. While we wait on the appeal.  ?

## 2021-07-29 ENCOUNTER — Encounter: Payer: Self-pay | Admitting: Internal Medicine

## 2021-07-31 NOTE — Progress Notes (Signed)
Appeal approved until 01/22/2022 or until coverage for medication no longer available under the benefit plan.  ?

## 2021-08-06 ENCOUNTER — Ambulatory Visit: Payer: 59 | Attending: Neurology

## 2021-08-06 ENCOUNTER — Telehealth: Payer: Self-pay

## 2021-08-06 DIAGNOSIS — R2681 Unsteadiness on feet: Secondary | ICD-10-CM | POA: Insufficient documentation

## 2021-08-06 DIAGNOSIS — M7612 Psoas tendinitis, left hip: Secondary | ICD-10-CM | POA: Insufficient documentation

## 2021-08-06 DIAGNOSIS — M542 Cervicalgia: Secondary | ICD-10-CM | POA: Insufficient documentation

## 2021-08-06 NOTE — Telephone Encounter (Signed)
TC due to missed visit, spoke with patient, she stated she had called to cancel appointment due to work conflict.  Made aware she did not have any additional visits and stated she would call to schedule additional visits  ?

## 2021-08-06 NOTE — Therapy (Deleted)
?OUTPATIENT PHYSICAL THERAPY TREATMENT NOTE ? ? ?Patient Name: Kendra Gonzalez ?MRN: [id number] ?DOB:[birth year], 51 y.o., female ?Today's Date: 08/06/2021 ? ?PCP: Isaac Bliss, Rayford Halsted, MD ?REFERRING PROVIDER: Pieter Partridge, DO ? ? ? ? ?Past Medical History:  ?Diagnosis Date  ? Allergy   ? Arthritis   ? Asthma   ? Cataract   ? bilateral eyes  ? Endometriosis   ? GERD (gastroesophageal reflux disease)   ? Headache   ? migraines  ? History of tetanus, diphtheria, and acellular pertussis booster vaccination (Tdap)   ? 07/30/2010  ? Hyperlipidemia   ? Kidney stones   ? Pancreatitis   ? ?Past Surgical History:  ?Procedure Laterality Date  ? ABDOMINAL HYSTERECTOMY    ? arthroscopic knee surgery     ? carpel tunnel    ? CHOLECYSTECTOMY    ? ERCP  01/08/2012  ? Procedure: ENDOSCOPIC RETROGRADE CHOLANGIOPANCREATOGRAPHY (ERCP);  Surgeon: Beryle Beams, MD;  Location: [facility] ENDOSCOPY;  Service: Endoscopy;  Laterality: N/A;  ? HIP ARTHROPLASTY    ? left  ? HIP SURGERY    ? oophrectomy  2011  ? secondary to cysts  ? THYROIDECTOMY N/A 11/21/2020  ? Procedure: TOTAL THYROIDECTOMY;  Surgeon: Armandina Gemma, MD;  Location: [facility] ORS;  Service: General;  Laterality: N/A;  ? TUBAL LIGATION    ? UPPER GASTROINTESTINAL ENDOSCOPY  2016  ? ?Patient Active Problem List  ? Diagnosis Date Noted  ? Hypothyroidism 05/16/2021  ? Vitamin D deficiency 04/18/2021  ? IGT (impaired glucose tolerance) 04/18/2021  ? Hypercalcemia 02/24/2021  ? 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 Unilateral primary osteoarthritis, left knee 07/16/2020  ? Presence of left artificial hip joint 07/09/2017  ? Multinodular goiter 04/17/2015  ? History of stomach ulcers 03/22/2015  ? Chronic pain of left heel 04/23/2014  ? Chronic pain syndrome 03/19/2014  ? Asthma 01/04/2013  ? BMI 29.0-29.9,adult 10/12/2011  ? Paroxysmal nerve pain 01/22/2011  ? Hyperlipidemia 02/16/2007  ? GERD 02/16/2007  ? ? ?REFERRING DIAG: M54.2 (ICD-10-CM) - Cervicalgia Onset:   ? ?THERAPY  DIAG: Cervicalgia ?  ?Cervicogenic headache ? ? ?PERTINENT HISTORY: History of migraines ? ?PRECAUTIONS: migraines ? ?SUBJECTIVE: Feels good today, symptoms flare up at work ? ?PAIN:  ?Are you having pain? No ? ? ? ? ? ?OBJECTIVE:  ?  ?DIAGNOSTIC FINDINGS:  ?None noted ?  ?PATIENT SURVEYS:  ?FOTO 57 ?  ?  ?COGNITION: ?Overall cognitive status: Within functional limits for tasks assessed ?  ?  ?SENSATION: ?WFL ?  ?POSTURE:  ?Rounded, forward shoulders ?  ?PALPATION: ?Global tenderness through cervical and upper thoracic musculature  ?  ?CERVICAL ROM:  ?  ?Active ROM A/PROM (deg) ?07/10/2021  ?Flexion 75%*  ?Extension 33%  ?Right lateral flexion 50%  ?Left lateral flexion 75%  ?Right rotation 75%  ?Left rotation 75%  ? * pain ?  ?UE ROM: ?  ?Active ROM Right ?07/10/2021 Left ?07/10/2021  ?Shoulder flexion [facility] WFL  ?Shoulder extension [facility] WFL  ?Shoulder abduction [facility] WFL  ?Shoulder adduction      ?Shoulder extension      ?Shoulder internal rotation [facility] WFL  ?Shoulder external rotation [facility] WFL  ?Elbow flexion [facility] WFL  ?Elbow extension [facility] WFL  ?Wrist flexion      ?Wrist extension      ?Wrist ulnar deviation      ?Wrist radial deviation      ?Wrist pronation      ?Wrist supination      ?Grip strength  WFL WFL  ? (Blank rows = not tested) ?  ?UE MMT: ?  ?MMT Right ?07/10/2021 Left ?07/10/2021  ?Shoulder flexion [facility] WFL  ?Shoulder extension [facility] WFL  ?Shoulder abduction [facility] WFL  ?Shoulder adduction      ?Shoulder extension      ?Shoulder internal rotation [facility] WFL  ?Shoulder external rotation [facility] WFL  ?Middle trapezius      ?Lower trapezius      ?Elbow flexion      ?Elbow extension      ?Wrist flexion      ?Wrist extension      ?Wrist ulnar deviation      ?Wrist radial deviation      ?Wrist pronation      ?Wrist supination      ?Grip strength      ? (Blank rows = not tested) ?  ?CERVICAL SPECIAL TESTS:  ?Neck flexor muscle endurance test: Negative 30s hold time ?  ?  ?FUNCTIONAL TESTS:  ?N/a ?  ?PATIENT SURVEYS:  ?FOTO 61 ?   ?TODAY'S TREATMENT:  ? [facility] Adult PT Treatment:                                                DATE: 07/23/21 ?Therapeutic Exercise: ?UBE L1 3/3 ?Seated chin tucks 10x  ?Scapular retractions 10x ?Supine chin tucks 10x ?Prone Ws 15x ?Prone shoulder flexion 15x ?Prone shoulder extension 15x ?Prone scapular retractions 15x ?Manual Therapy: ?SO traction ?Upper thoracic PA mobs T1-4,  ?Upper thoracic lower cervical rotational mobs into R rotation ? ?  ?PATIENT EDUCATION:  ?Education details: Discussed eval findings, rehab rationale and POC and patient is in agreement ?Person educated: Patient ?Education method: Explanation, Demonstration, and Handouts ?Education comprehension: verbalized understanding, returned demonstration, and needs further education ?  ?  ?HOME EXERCISE PROGRAM: ?Access Code: [email or web address] ?URL: [email or web address] ?Date: 07/23/2021 ?Prepared by: Sharlynn Oliphant ? ?Exercises ?- Seated Thoracic Lumbar Extension  - 2 x daily - 7 x weekly - 1 sets - 10 reps ?- Prone Scapular Slide with Shoulder Extension  - 2 x daily - 7 x weekly - 2 sets - 15 reps - 3s hold ?- Prone W Scapular Retraction  - 2 x daily - 7 x weekly - 2 sets - 15 reps - 3s hold ?- Prone Scapular Retraction with Hands Behind Head  - 2 x daily - 7 x weekly - 2 sets - 15 reps - 3s hold ? ?Patient Education ?- Office Posture ?  ?ASSESSMENT: ?  ?CLINICAL IMPRESSION: Onset of HA during SO traction requiring a brief rest period.  Continued soft tissue and postural dysfunction.  Added prone postural and posterior shoulder strengthening tasks.  Regained 75% R cervical rotation following manual ? ?  ?OBJECTIVE IMPAIRMENTS decreased activity tolerance, decreased knowledge of condition, decreased ROM, increased fascial restrictions, postural dysfunction, and pain.  ?  ?PERSONAL FACTORS Behavior pattern, Fitness, Past/current experiences, Time since onset of injury/illness/exacerbation, and migraines  are also affecting patient's functional  outcome.  ?  ?  ?REHAB POTENTIAL: Good ?  ?CLINICAL DECISION MAKING: Stable/uncomplicated ?  ?EVALUATION COMPLEXITY: Low ?  ?  ?GOALS: ?Goals reviewed with patient? Yes ?  ?SHORT TERM GOALS: Target date: 07/24/2021 ?  ?Patient to demonstrate independence in HEP  ?Baseline: [id number] ?Goal status: INITIAL ?  ?2.  Patient to demo good postural alignment against wall ?Baseline: forward  head and rounded shoulders ?Goal status: INITIAL ?  ?  ?LONG TERM GOALS: Target date: 08/07/2021 ?  ?2/10 resting pain ?Baseline: 3/10 ?Goal status: INITIAL ?  ?2.  Increase FOTO score to 72 ?Baseline: 70 ?Goal status: INITIAL ?  ?3.  Increase cervical AROM to 50% extension and 75% RSB ?Baseline: 3% and 50% respectively ?Goal status: INITIAL ?  ?  ?PLAN: ?PT FREQUENCY: 1-2x/week ?  ?PT DURATION: 4 weeks ?  ?PLANNED INTERVENTIONS: Therapeutic exercises, Therapeutic activity, Neuromuscular re-education, Balance training, Gait training, Patient/Family education, Joint mobilization, Dry Needling, and Manual therapy ?  ?PLAN FOR NEXT SESSION: HEP review and update, postural training, manual techniques, TPDN as appropriate, posterior shoulder strengthening ? ? ? ?Lanice Shirts, PT ?08/06/2021, 10:05 AM ? ?   ?

## 2021-08-13 ENCOUNTER — Ambulatory Visit: Payer: 59 | Admitting: Internal Medicine

## 2021-08-13 ENCOUNTER — Encounter: Payer: Self-pay | Admitting: Internal Medicine

## 2021-08-13 VITALS — BP 130/90 | HR 72 | Temp 97.8°F

## 2021-08-13 DIAGNOSIS — I10 Essential (primary) hypertension: Secondary | ICD-10-CM | POA: Diagnosis not present

## 2021-08-13 NOTE — Progress Notes (Signed)
? ? ? ?Established Patient Office Visit ? ? ? ? ?This visit occurred during the SARS-CoV-2 public health emergency.  Safety protocols were in place, including screening questions prior to the visit, additional usage of staff PPE, and extensive cleaning of exam room while observing appropriate contact time as indicated for disinfecting solutions.  ? ? ?CC/Reason for Visit: Blood pressure follow-up ? ?HPI: Kendra Gonzalez is a 51 y.o. female who is coming in today for the above mentioned reasons. Past Medical History is significant for: Hypertension.  At last visit her Diovan was increased to 160/25 mg.  2 days ago she had a psoas lengthening surgery due to anterior hip pain and difficulties with range of motion.  For reasons that are not quite clear to me she discontinued taking her blood pressure medication about a month ago.  She has no clear explanation as to why other than her headaches had improved with her migraine preventative so she thought she no longer needed blood pressure medication. ? ? ?Past Medical/Surgical History: ?Past Medical History:  ?Diagnosis Date  ? Allergy   ? Arthritis   ? Asthma   ? Cataract   ? bilateral eyes  ? Endometriosis   ? GERD (gastroesophageal reflux disease)   ? Headache   ? migraines  ? History of tetanus, diphtheria, and acellular pertussis booster vaccination (Tdap)   ? 07/30/2010  ? Hyperlipidemia   ? Kidney stones   ? Pancreatitis   ? ? ?Past Surgical History:  ?Procedure Laterality Date  ? ABDOMINAL HYSTERECTOMY    ? arthroscopic knee surgery     ? carpel tunnel    ? CHOLECYSTECTOMY    ? ERCP  01/08/2012  ? Procedure: ENDOSCOPIC RETROGRADE CHOLANGIOPANCREATOGRAPHY (ERCP);  Surgeon: Beryle Beams, MD;  Location: [facility] ENDOSCOPY;  Service: Endoscopy;  Laterality: N/A;  ? HIP ARTHROPLASTY    ? left  ? HIP SURGERY    ? oophrectomy  2011  ? secondary to cysts  ? THYROIDECTOMY N/A 11/21/2020  ? Procedure: TOTAL THYROIDECTOMY;  Surgeon: Armandina Gemma, MD;  Location: [facility] ORS;   Service: General;  Laterality: N/A;  ? TUBAL LIGATION    ? UPPER GASTROINTESTINAL ENDOSCOPY  2016  ? ? ?Social History: ? reports that she has never smoked. She has never used smokeless tobacco. She reports that she does not drink alcohol and does not use drugs. ? ?Allergies: ?Allergies  ?Allergen Reactions  ? Hydromorphone Itching  ? ? ?Family History:  ?Family History  ?Problem Relation Age of Onset  ? Heart disease Daughter   ? Hypertension Father   ? Hypertension Sister   ? Hypertension Brother   ? Hypertension Maternal Grandfather   ? Heart disease Maternal Grandfather   ? Hypertension Paternal Grandfather   ? Hypertension Brother   ? Hypertension Sister   ? Thyroid disease Neg Hx   ? Colon polyps Neg Hx   ? Colon cancer Neg Hx   ? Esophageal cancer Neg Hx   ? Rectal cancer Neg Hx   ? Stomach cancer Neg Hx   ? ? ? ?Current Outpatient Medications:  ?  acetaminophen (TYLENOL) 500 MG tablet, Take 1,000 mg by mouth every 8 (eight) hours as needed for moderate pain., Disp: , Rfl:  ?  albuterol (PROVENTIL) (2.5 MG/3ML) 0.083% nebulizer solution, Take 3 mLs (2.5 mg total) by nebulization every 6 (six) hours as needed for wheezing or shortness of breath., Disp: 150 mL, Rfl: 1 ?  albuterol (VENTOLIN HFA) 108 (90 Base) MCG/ACT  inhaler, Inhale 2 puffs into the lungs every 6 (six) hours as needed for wheezing or shortness of breath., Disp: 18 g, Rfl: 2 ?  atorvastatin (LIPITOR) 40 MG tablet, Take 1 tablet (40 mg total) by mouth daily., Disp: 90 tablet, Rfl: 1 ?  baclofen (LIORESAL) 10 MG tablet, Take 10 mg by mouth 2 (two) times daily as needed (migraines)., Disp: , Rfl:  ?  cyclobenzaprine (FLEXERIL) 5 MG tablet, Take 1 tablet (5 mg total) by mouth at bedtime as needed for muscle spasms., Disp: 30 tablet, Rfl: 0 ?  diclofenac (VOLTAREN) 75 MG EC tablet, Take 1 tablet (75 mg total) by mouth 2 (two) times daily., Disp: 30 tablet, Rfl: 0 ?  diclofenac sodium (VOLTAREN) 1 % GEL, Apply up to three times a day as needed to  large joint., Disp: 3 Tube, Rfl: 4 ?  fluticasone-salmeterol (ADVAIR HFA) 115-21 MCG/ACT inhaler, Inhale 2 puffs into the lungs 2 (two) times daily., Disp: 1 each, Rfl: 2 ?  Galcanezumab-gnlm (EMGALITY) 120 MG/ML SOAJ, Inject 120 mg into the skin every 28 (twenty-eight) days., Disp: 1.12 mL, Rfl: 11 ?  levothyroxine (SYNTHROID) 137 MCG tablet, Take 1 tablet (137 mcg total) by mouth daily before breakfast., Disp: 90 tablet, Rfl: 3 ?  magnesium gluconate (MAGONATE) 500 MG tablet, Take 500 mg by mouth at bedtime., Disp: , Rfl:  ?  ondansetron (ZOFRAN-ODT) 4 MG disintegrating tablet, Take 1 tablet (4 mg total) by mouth every 8 (eight) hours as needed for nausea or vomiting., Disp: 20 tablet, Rfl: 5 ?  pantoprazole (PROTONIX) 40 MG tablet, Take 40 mg by mouth daily., Disp: , Rfl:  ?  propranolol (INDERAL) 60 MG tablet, Take 1 tablet (60 mg total) by mouth 3 (three) times daily., Disp: 30 tablet, Rfl: 0 ?  rizatriptan (MAXALT-MLT) 10 MG disintegrating tablet, Take 1 tablet (10 mg total) by mouth as needed for migraine (May repeat after 2 hours.  Maximum 2 tablets). May repeat in 2 hours if needed, Disp: 10 tablet, Rfl: 0 ?  tiZANidine (ZANAFLEX) 2 MG tablet, Take 1-2 tablets (2-4 mg total) by mouth every 8 (eight) hours as needed for muscle spasms., Disp: 20 tablet, Rfl: 0 ?  zonisamide (ZONEGRAN) 25 MG capsule, Take 100 mg by mouth at bedtime., Disp: , Rfl:  ?  gabapentin (NEURONTIN) 300 MG capsule, Take 1 capsule (300 mg total) by mouth 3 (three) times daily., Disp: 90 capsule, Rfl: 0 ?  promethazine (PHENERGAN) 25 MG tablet, Take 1 tablet (25 mg total) by mouth once for 1 dose., Disp: 2 tablet, Rfl: 0 ?  valsartan-hydrochlorothiazide (DIOVAN-HCT) 160-25 MG tablet, Take 1 tablet by mouth daily. (Patient not taking: Reported on 08/13/2021), Disp: 90 tablet, Rfl: 1 ? ?Review of Systems:  ?Constitutional: Denies fever, chills, diaphoresis, appetite change and fatigue.  ?HEENT: Denies photophobia, eye pain, redness, hearing  loss, ear pain, congestion, sore throat, rhinorrhea, sneezing, mouth sores, trouble swallowing, neck pain, neck stiffness and tinnitus.   ?Respiratory: Denies SOB, DOE, cough, chest tightness,  and wheezing.   ?Cardiovascular: Denies chest pain, palpitations and leg swelling.  ?Gastrointestinal: Denies nausea, vomiting, abdominal pain, diarrhea, constipation, blood in stool and abdominal distention.  ?Genitourinary: Denies dysuria, urgency, frequency, hematuria, flank pain and difficulty urinating.  ?Endocrine: Denies: hot or cold intolerance, sweats, changes in hair or nails, polyuria, polydipsia. ?Musculoskeletal: Denies myalgias, back pain, joint swelling, arthralgias and gait problem.  ?Skin: Denies pallor, rash and wound.  ?Neurological: Denies dizziness, seizures, syncope, weakness, light-headedness, numbness and headaches.  ?  Hematological: Denies adenopathy. Easy bruising, personal or family bleeding history  ?Psychiatric/Behavioral: Denies suicidal ideation, mood changes, confusion, nervousness, sleep disturbance and agitation ? ? ? ?Physical Exam: ?Vitals:  ? 08/13/21 0805  ?BP: 130/90  ?Pulse: 72  ?Temp: 97.8 ?F (36.6 ?C)  ?TempSrc: Oral  ?SpO2: 99%  ? ? ?There is no height or weight on file to calculate BMI. ? ? ?Constitutional: NAD, calm, comfortable ?Eyes: PERRL, lids and conjunctivae normal ?ENMT: Mucous membranes are moist.  ?Respiratory: clear to auscultation bilaterally, no wheezing, no crackles. Normal respiratory effort. No accessory muscle use.  ?Cardiovascular: Regular rate and rhythm, no murmurs / rubs / gallops. No extremity edema. ?Psychiatric: Normal judgment and insight. Alert and oriented x 3. Normal mood.  ? ? ?Impression and Plan: ? ?Primary hypertension ?-Have advised that she resume Diovan HCT 160/25 mg. ?-She will return in 6 to 8 weeks for follow-up. ? ? ?Time spent:20 minutes reviewing chart, interviewing and examining patient and formulating plan of care. ? ? ? ? ?Lelon Frohlich, MD ?[facility] ? ? ?

## 2021-08-14 ENCOUNTER — Ambulatory Visit: Payer: 59 | Admitting: Neurology

## 2021-08-15 ENCOUNTER — Telehealth: Payer: Self-pay

## 2021-08-15 ENCOUNTER — Ambulatory Visit: Payer: 59

## 2021-08-15 DIAGNOSIS — M542 Cervicalgia: Secondary | ICD-10-CM

## 2021-08-15 DIAGNOSIS — M7612 Psoas tendinitis, left hip: Secondary | ICD-10-CM | POA: Diagnosis present

## 2021-08-15 DIAGNOSIS — R2681 Unsteadiness on feet: Secondary | ICD-10-CM

## 2021-08-15 NOTE — Therapy (Signed)
?OUTPATIENT PHYSICAL THERAPY LOWER EXTREMITY EVALUATION ? ? ?Patient Name: Kendra Gonzalez ?MRN: [id number] ?DOB:[birth year], 51 y.o., female ?Today's Date: 08/15/2021 ? ? PT End of Session - 08/15/21 0920   ? ? Visit Number 3   ? Number of Visits 9   ? Date for PT Re-Evaluation 09/12/21   ? Authorization Type UHC   ? Progress Note Due on Visit 9   ? PT Start Time 7782   ? PT Stop Time 1000   ? PT Time Calculation (min) 45 min   ? Activity Tolerance Patient tolerated treatment well   ? Behavior During Therapy [facility] for tasks assessed/performed   ? ?  ?  ? ?  ? ? ?Past Medical History:  ?Diagnosis Date  ? Allergy   ? Arthritis   ? Asthma   ? Cataract   ? bilateral eyes  ? Endometriosis   ? GERD (gastroesophageal reflux disease)   ? Headache   ? migraines  ? History of tetanus, diphtheria, and acellular pertussis booster vaccination (Tdap)   ? 07/30/2010  ? Hyperlipidemia   ? Kidney stones   ? Pancreatitis   ? ?Past Surgical History:  ?Procedure Laterality Date  ? ABDOMINAL HYSTERECTOMY    ? arthroscopic knee surgery     ? carpel tunnel    ? CHOLECYSTECTOMY    ? ERCP  01/08/2012  ? Procedure: ENDOSCOPIC RETROGRADE CHOLANGIOPANCREATOGRAPHY (ERCP);  Surgeon: Beryle Beams, MD;  Location: [facility] ENDOSCOPY;  Service: Endoscopy;  Laterality: N/A;  ? HIP ARTHROPLASTY    ? left  ? HIP SURGERY    ? oophrectomy  2011  ? secondary to cysts  ? THYROIDECTOMY N/A 11/21/2020  ? Procedure: TOTAL THYROIDECTOMY;  Surgeon: Armandina Gemma, MD;  Location: [facility] ORS;  Service: General;  Laterality: N/A;  ? TUBAL LIGATION    ? UPPER GASTROINTESTINAL ENDOSCOPY  2016  ? ?Patient Active Problem List  ? Diagnosis Date Noted  ? Hypothyroidism 05/16/2021  ? Vitamin D deficiency 04/18/2021  ? IGT (impaired glucose tolerance) 04/18/2021  ? Hypercalcemia 02/24/2021  ? 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 Unilateral primary osteoarthritis, left knee 07/16/2020  ? Presence of left artificial hip joint 07/09/2017  ? Multinodular goiter 04/17/2015  ?  History of stomach ulcers 03/22/2015  ? Chronic pain of left heel 04/23/2014  ? Chronic pain syndrome 03/19/2014  ? Asthma 01/04/2013  ? BMI 29.0-29.9,adult 10/12/2011  ? Paroxysmal nerve pain 01/22/2011  ? Hyperlipidemia 02/16/2007  ? GERD 02/16/2007  ? ? ?PCP: Isaac Bliss, Rayford Halsted, MD ? ?REFERRING PROVIDER: Chari Manning, MD  ? ?REFERRING DIAG: L psoas release 08/11/21 ? ?THERAPY DIAG:  ?Unsteadiness on feet ? ?Psoas tendinitis of left side ? ?Cervicalgia ? ?ONSET DATE: 08/11/21 ? ?SUBJECTIVE:  ? ?SUBJECTIVE STATEMENT: ?Reports undergoing L psoas release 08/11/21 due to long standing L hip pain and discomfort.  Cervical symptoms resolved and does not feel further PT needed on neck. ? ?PERTINENT HISTORY: ?L psoas release 08/11/21, L THA 2019, 20 year history of cervical pain and migraine headaches ? ?PAIN:  ?Are you having pain? No ? ?PRECAUTIONS: None ? ?WEIGHT BEARING RESTRICTIONS No ? ?FALLS:  ?Has patient fallen in last 6 months? No ? ?LIVING ENVIRONMENT: ?Lives with: lives with their family ?Lives in: House/apartment ?Stairs: No ? ?OCCUPATION: CNA ? ?PLOF: Independent ? ?PATIENT GOALS To walk w/o a limp ? ? ?OBJECTIVE:  ? ?DIAGNOSTIC FINDINGS: none noted ? ?PATIENT SURVEYS:  ?FOTO 43 (61 predicted) ? ?COGNITION: ? Overall cognitive status:  Within functional limits for tasks assessed   ?  ?SENSATION: ?WFL ? ?MUSCLE LENGTH: ?Hamstrings: Right 80 deg; Left 60 deg(L anterior hip discomfort) ? ? ?POSTURE:  ?Flexed L hip ? ?PALPATION: ?Not tested ? ?LE ROM: ? ?Passive ROM Right ?08/15/2021 Left ?08/15/2021  ?Hip flexion 120 85  ?Hip extension 20 0  ?Hip abduction    ?Hip adduction    ?Hip internal rotation    ?Hip external rotation    ?Knee flexion    ?Knee extension    ?Ankle dorsiflexion    ?Ankle plantarflexion    ?Ankle inversion    ?Ankle eversion    ? (Blank rows = not tested) ? ?LE MMT: ? ?MMT Right ?08/15/2021 Left ?08/15/2021  ?Hip flexion 5 3  ?Hip extension    ?Hip abduction 5 3+  ?Hip adduction     ?Hip internal rotation    ?Hip external rotation    ?Knee flexion    ?Knee extension    ?Ankle dorsiflexion    ?Ankle plantarflexion    ?Ankle inversion    ?Ankle eversion    ? (Blank rows = not tested) ? ? ?FUNCTIONAL TESTS:  ?5 times sit to stand: 16s ? ?GAIT: ?Distance walked: 56f x2 ?Assistive device utilized: None ?Level of assistance: Complete Independence ?Comments: decreased L stance and L hip extension. ? ? ? ?TODAY'S TREATMENT: ?Eval and HEP ? ? ?PATIENT EDUCATION:  ?Education details: Discussed eval findings, rehab rationale and POC and patient is in agreement  ?Person educated: Patient ?Education method: Explanation ?Education comprehension: verbalized understanding, returned demonstration, and needs further education ? ? ?HOME EXERCISE PROGRAM: ?Access Code: [email or web address]?URL: [email or web address] ?Date: 08/15/2021 ?Prepared by: JSharlynn Oliphant? ?Exercises ?- Supine Quad Set  - 2 x daily - 7 x weekly - 2 sets - 15 reps - 3s hold ?- Supine Heel Slide  - 2 x daily - 7 x weekly - 2 sets - 15 reps ?- Sidelying Hip Abduction  - 2 x daily - 7 x weekly - 2 sets - 15 reps ?- Clamshell  - 2 x daily - 7 x weekly - 2 sets - 15 reps ?- Supine Bridge  - 2 x daily - 7 x weekly - 2 sets - 15 reps ? ?ASSESSMENT: ? ?CLINICAL IMPRESSION: ?Patient is a 51y.o. female who was seen today for physical therapy evaluation and treatment for L hip weakness and stiffness following psoas release.  She presents with minimal pain, decreased ROM and strength as well as changes to her gait pattern resulting in a decreased L stance time and stride length.  ? ? ?OBJECTIVE IMPAIRMENTS Abnormal gait, decreased activity tolerance, decreased mobility, difficulty walking, decreased ROM, decreased strength, increased edema, impaired flexibility, and postural dysfunction.  ? ? ?PERSONAL FACTORS Time since onset of injury/illness/exacerbation are also affecting patient's functional outcome.  ? ? ?REHAB POTENTIAL: Good ? ?CLINICAL  DECISION MAKING: Stable/uncomplicated ? ?EVALUATION COMPLEXITY: Low ? ? ?GOALS: ?Goals reviewed with patient? Yes ? ?SHORT TERM GOALS: STGs=LTGs ? ? ?LONG TERM GOALS: Target date: 09/12/2021 ? ?Patient to demonstrate independence in HEP ?Baseline: [id number] ?Goal status: INITIAL ? ?2.  20d L hip extension ?Baseline: 0d ?Goal status: INITIAL ? ?3.  Increase L hip strength to 4/5 ?Baseline:  ?MMT Right ?08/15/2021 Left ?08/15/2021  ?Hip flexion 5 3  ?Hip extension 5 3+  ?Hip abduction 5 3+  ? ?Goal status: INITIAL ? ?4.  Increase L stance time and step length to equal R ?Baseline: decrease L stance time and  step length ?Goal status: INITIAL ? ? ? ?PLAN: ?PT FREQUENCY: 1x/week ? ?PT DURATION: 4 weeks ? ?PLANNED INTERVENTIONS: Therapeutic exercises, Therapeutic activity, Neuromuscular re-education, Balance training, Gait training, Patient/Family education, Joint mobilization, Stair training, and Manual therapy ? ?PLAN FOR NEXT SESSION: Gait and balance training, gentle stretching, aerobic tasks, CKC strengthening ? ? ?Lanice Shirts, PT ?08/15/2021, 10:14 AM  ?

## 2021-08-15 NOTE — Telephone Encounter (Signed)
In error

## 2021-08-18 ENCOUNTER — Ambulatory Visit: Payer: 59

## 2021-08-18 DIAGNOSIS — R2681 Unsteadiness on feet: Secondary | ICD-10-CM | POA: Diagnosis not present

## 2021-08-18 DIAGNOSIS — M7612 Psoas tendinitis, left hip: Secondary | ICD-10-CM

## 2021-08-18 NOTE — Therapy (Addendum)
OUTPATIENT PHYSICAL THERAPY TREATMENT NOTE/DC SUMMARY   Patient Name: Kendra Gonzalez MRN: [id number] DOB:[birth year], 51 y.o., female Today's Date: 08/18/2021  PCP: Isaac Bliss, Rayford Halsted, MD REFERRING PROVIDER: Isaac Bliss, Estel* PHYSICAL THERAPY DISCHARGE SUMMARY  Visits from Start of Care: 4  Current functional level related to goals / functional outcomes: Goals partially met   Remaining deficits: UTA   Education / Equipment: HEP   Patient agrees to discharge. Patient goals were partially met. Patient is being discharged due to not returning since the last visit.  END OF SESSION:   PT End of Session - 08/18/21 0837     Visit Number 4    Number of Visits 9    Date for PT Re-Evaluation 09/12/21    Authorization Type UHC    Progress Note Due on Visit 9    PT Start Time 0830    PT Stop Time 0910    PT Time Calculation (min) 40 min    Activity Tolerance Patient tolerated treatment well    Behavior During Therapy WFL for tasks assessed/performed             Past Medical History:  Diagnosis Date   Allergy    Arthritis    Asthma    Cataract    bilateral eyes   Endometriosis    GERD (gastroesophageal reflux disease)    Headache    migraines   History of tetanus, diphtheria, and acellular pertussis booster vaccination (Tdap)    07/30/2010   Hyperlipidemia    Kidney stones    Pancreatitis    Past Surgical History:  Procedure Laterality Date   ABDOMINAL HYSTERECTOMY     arthroscopic knee surgery      carpel tunnel     CHOLECYSTECTOMY     ERCP  01/08/2012   Procedure: ENDOSCOPIC RETROGRADE CHOLANGIOPANCREATOGRAPHY (ERCP);  Surgeon: Beryle Beams, MD;  Location: [facility] ENDOSCOPY;  Service: Endoscopy;  Laterality: N/A;   HIP ARTHROPLASTY     left   HIP SURGERY     oophrectomy  2011   secondary to cysts   THYROIDECTOMY N/A 11/21/2020   Procedure: TOTAL THYROIDECTOMY;  Surgeon: Armandina Gemma, MD;  Location: [facility] ORS;  Service: General;   Laterality: N/A;   TUBAL LIGATION     UPPER GASTROINTESTINAL ENDOSCOPY  2016   Patient Active Problem List   Diagnosis Date Noted   Hypothyroidism 05/16/2021   Vitamin D deficiency 04/18/2021   IGT (impaired glucose tolerance) 04/18/2021   Hypercalcemia 02/24/2021   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   Unilateral primary osteoarthritis, left knee 07/16/2020   Presence of left artificial hip joint 07/09/2017   Multinodular goiter 04/17/2015   History of stomach ulcers 03/22/2015   Chronic pain of left heel 04/23/2014   Chronic pain syndrome 03/19/2014   Asthma 01/04/2013   BMI 29.0-29.9,adult 10/12/2011   Paroxysmal nerve pain 01/22/2011   Hyperlipidemia 02/16/2007   GERD 02/16/2007    REFERRING DIAG: L psoas release 08/11/21  THERAPY DIAG:  PERTINENT HISTORY: Unsteadiness on feet   Psoas tendinitis of left side   Cervicalgia  PRECAUTIONS: L psoas release 08/11/21, L THA 2019, 20 year history of cervical pain and migraine headaches    SUBJECTIVE: L hip sore today but not sure if it is post-op discomfort or chronic bursitis   PAIN:  Are you having pain? Yes, 3/10 lateral L hip, described as sore, worse with activity and better with rest   OBJECTIVE: (objective measures completed at initial evaluation unless otherwise dated)  OBJECTIVE:    DIAGNOSTIC FINDINGS: none noted   PATIENT SURVEYS:  FOTO 43 (61 predicted)   COGNITION:           Overall cognitive status: Within functional limits for tasks assessed                          SENSATION: WFL   MUSCLE LENGTH: Hamstrings: Right 80 deg; Left 60 deg(L anterior hip discomfort)     POSTURE:  Flexed L hip   PALPATION: Not tested   LE ROM:   Passive ROM Right 08/15/2021 Left 08/15/2021  Hip flexion 120 85  Hip extension 20 0  Hip abduction      Hip adduction      Hip internal rotation      Hip external rotation      Knee flexion      Knee extension      Ankle dorsiflexion      Ankle  plantarflexion      Ankle inversion      Ankle eversion       (Blank rows = not tested)   LE MMT:   MMT Right 08/15/2021 Left 08/15/2021  Hip flexion 5 3  Hip extension      Hip abduction 5 3+  Hip adduction      Hip internal rotation      Hip external rotation      Knee flexion      Knee extension      Ankle dorsiflexion      Ankle plantarflexion      Ankle inversion      Ankle eversion       (Blank rows = not tested)     FUNCTIONAL TESTS:  5 times sit to stand: 16s   GAIT: Distance walked: 12f x2 Assistive device utilized: None Level of assistance: Complete Independence Comments: decreased L stance and L hip extension.       TODAY'S TREATMENT: [facility] Adult PT Treatment:                                                DATE: 08/18/21 Therapeutic Exercise: Nustep L2 8 min L ITB stretch 30s x3 Quad set 15x Heel slide 15x over towel Abd 15x Clamshell 15x Bridge 15x Hip fallout YTB 15x L hip flexor stretch 30s x3 Step ups 4"x10 L Lateral steps 4" 10x L Runners step B 4" 10/10 Heel/toe walk fwd/bwd 5 trips at counter  PATIENT EDUCATION:  Education details: Discussed eval findings, rehab rationale and POC and patient is in agreement  Person educated: Patient Education method: Explanation Education comprehension: verbalized understanding, returned demonstration, and needs further education     HOME EXERCISE PROGRAM: Access Code: [email or web address]: [email or web address] Date: 08/18/2021 Prepared by: JSharlynn Oliphant Exercises - Supine Quad Set  - 2 x daily - 7 x weekly - 2 sets - 15 reps - 3s hold - Supine Heel Slide  - 2 x daily - 7 x weekly - 2 sets - 15 reps - Sidelying Hip Abduction  - 2 x daily - 7 x weekly - 2 sets - 15 reps - Clamshell  - 2 x daily - 7 x weekly - 2 sets - 15 reps - Supine Bridge  - 2 x daily - 7 x weekly - 2 sets - 15 reps -  Supine ITB Stretch  - 2 x daily - 7 x weekly - 1 sets - 3 reps - 30s hold   ASSESSMENT:   CLINICAL  IMPRESSION: Only mild soreness to note.  Today's session reviewed HEP for compliance and form, added an ITB stretch to HEP, continued strengthening of L hip accommodating for post-op soreness and irritation.  Began CKC tasks and balance/proprioceptive taining     OBJECTIVE IMPAIRMENTS Abnormal gait, decreased activity tolerance, decreased mobility, difficulty walking, decreased ROM, decreased strength, increased edema, impaired flexibility, and postural dysfunction.      PERSONAL FACTORS Time since onset of injury/illness/exacerbation are also affecting patient's functional outcome.      REHAB POTENTIAL: Good   CLINICAL DECISION MAKING: Stable/uncomplicated   EVALUATION COMPLEXITY: Low     GOALS: Goals reviewed with patient? Yes   SHORT TERM GOALS: STGs=LTGs     LONG TERM GOALS: Target date: 09/12/2021   Patient to demonstrate independence in HEP Baseline: [facility] Goal status: INITIAL   2.  20d L hip extension Baseline: 0d Goal status: INITIAL   3.  Increase L hip strength to 4/5 Baseline:  MMT Right 08/15/2021 Left 08/15/2021  Hip flexion 5 3  Hip extension 5 3+  Hip abduction 5 3+    Goal status: INITIAL   4.  Increase L stance time and step length to equal R Baseline: decrease L stance time and step length Goal status: INITIAL       PLAN: PT FREQUENCY: 1x/week   PT DURATION: 4 weeks   PLANNED INTERVENTIONS: Therapeutic exercises, Therapeutic activity, Neuromuscular re-education, Balance training, Gait training, Patient/Family education, Joint mobilization, Stair training, and Manual therapy   PLAN FOR NEXT SESSION: Gait and balance training, gentle stretching, aerobic tasks, CKC strengthening    Lanice Shirts, PT 08/18/2021, 8:38 AM

## 2021-08-19 ENCOUNTER — Other Ambulatory Visit: Payer: Self-pay | Admitting: Neurology

## 2021-08-19 MED ORDER — RIZATRIPTAN BENZOATE 10 MG PO TBDP: 10.0000 mg | ORAL_TABLET | ORAL | 4 refills | Status: DC | PRN

## 2021-08-20 ENCOUNTER — Telehealth: Payer: Self-pay | Admitting: Neurology

## 2021-08-20 NOTE — Telephone Encounter (Signed)
Kendra (SE), [place] [facility] [place].Receipt confirmed by pharmacy (08/19/2021  4:38 PM EDT). ? ?Rizatriptan has been sent per above. ?

## 2021-08-20 NOTE — Telephone Encounter (Signed)
Patient left message with access nurse.  She stated she would like to get her medication rizatriptan refilled. ?Access nurse message in [place] ?

## 2021-08-25 ENCOUNTER — Ambulatory Visit: Payer: 59 | Attending: Orthopedic Surgery

## 2021-08-25 ENCOUNTER — Telehealth: Payer: Self-pay

## 2021-08-25 NOTE — Telephone Encounter (Signed)
TC due to missed visit, VM left with reminder of next appointment. ?

## 2021-09-01 ENCOUNTER — Other Ambulatory Visit: Payer: Self-pay | Admitting: Neurology

## 2021-09-01 ENCOUNTER — Ambulatory Visit: Payer: 59

## 2021-09-04 ENCOUNTER — Other Ambulatory Visit: Payer: Self-pay | Admitting: Endocrinology

## 2021-09-04 DIAGNOSIS — E039 Hypothyroidism, unspecified: Secondary | ICD-10-CM

## 2021-09-08 ENCOUNTER — Ambulatory Visit: Payer: 59

## 2021-09-09 ENCOUNTER — Ambulatory Visit: Payer: 59 | Admitting: Endocrinology

## 2021-11-05 ENCOUNTER — Encounter: Payer: Self-pay | Admitting: Family Medicine

## 2021-11-05 ENCOUNTER — Ambulatory Visit (INDEPENDENT_AMBULATORY_CARE_PROVIDER_SITE_OTHER): Payer: 59 | Admitting: Family Medicine

## 2021-11-05 VITALS — BP 148/86 | HR 79 | Temp 97.7°F | Wt 199.4 lb

## 2021-11-05 DIAGNOSIS — I10 Essential (primary) hypertension: Secondary | ICD-10-CM

## 2021-11-05 DIAGNOSIS — R55 Syncope and collapse: Secondary | ICD-10-CM

## 2021-11-05 DIAGNOSIS — E890 Postprocedural hypothyroidism: Secondary | ICD-10-CM

## 2021-11-05 DIAGNOSIS — I951 Orthostatic hypotension: Secondary | ICD-10-CM

## 2021-11-05 LAB — LIPID PANEL
Cholesterol: 187 mg/dL (ref 0–200)
HDL: 79.7 mg/dL (ref >39.00)
LDL Cholesterol: 97 mg/dL (ref 0–99)
NonHDL: 107.23
Total CHOL/HDL Ratio: 2
Triglycerides: 52 mg/dL (ref 0.0–149.0)
VLDL: 10.4 mg/dL (ref 0.0–40.0)

## 2021-11-05 LAB — CBC WITH DIFFERENTIAL/PLATELET
Basophils Absolute: 0.1 10*3/uL (ref 0.0–0.1)
Basophils Relative: 1 % (ref 0.0–3.0)
Eosinophils Absolute: 0.1 10*3/uL (ref 0.0–0.7)
Eosinophils Relative: 1.1 % (ref 0.0–5.0)
HCT: 40 % (ref 36.0–46.0)
Hemoglobin: 13.2 g/dL (ref 12.0–15.0)
Lymphocytes Relative: 32 % (ref 12.0–46.0)
Lymphs Abs: 2.5 10*3/uL (ref 0.7–4.0)
MCHC: 33 g/dL (ref 30.0–36.0)
MCV: 88.8 fl (ref 78.0–100.0)
Monocytes Absolute: 0.5 10*3/uL (ref 0.1–1.0)
Monocytes Relative: 6.2 % (ref 3.0–12.0)
Neutro Abs: 4.7 10*3/uL (ref 1.4–7.7)
Neutrophils Relative %: 59.7 % (ref 43.0–77.0)
Platelets: 330 10*3/uL (ref 150.0–400.0)
RBC: 4.5 Mil/uL (ref 3.87–5.11)
RDW: 14.6 % (ref 11.5–15.5)
WBC: 7.8 10*3/uL (ref 4.0–10.5)

## 2021-11-05 LAB — TSH: TSH: 0.2 u[IU]/mL — ABNORMAL LOW (ref 0.35–5.50)

## 2021-11-05 LAB — BASIC METABOLIC PANEL
BUN: 9 mg/dL (ref 6–23)
CO2: 27 mEq/L (ref 19–32)
Calcium: 10.3 mg/dL (ref 8.4–10.5)
Chloride: 103 mEq/L (ref 96–112)
Creatinine, Ser: 0.95 mg/dL (ref 0.40–1.20)
GFR: 68.78 mL/min (ref >60.00)
Glucose, Bld: 93 mg/dL (ref 70–99)
Potassium: 4 mEq/L (ref 3.5–5.1)
Sodium: 139 mEq/L (ref 135–145)

## 2021-11-05 LAB — HEMOGLOBIN A1C: Hgb A1c MFr Bld: 6.1 % (ref 4.6–6.5)

## 2021-11-05 NOTE — Progress Notes (Signed)
Subjective:    Patient ID: Kendra Gonzalez, female    DOB: 27-Jul-1970, 51 y.o.   MRN: [id number]  Chief Complaint  Patient presents with   OTHER    Patient reports yesterday morning, she went to use the restroom. While sitting on toilet, broke out in sweat and states" feel like I'm going to black out". She added that this is not the first time.     HPI Patient was seen today for acute concern.  Patient endorses feeling presyncopal Tuesday morning.  Patient states she woke up to use the restroom.  While sitting on the toilet to have a BM patient endorses feeling diaphoretic like she was going to pass out. Patient states this is happened  in the past-immediately after thyroidectomy.  Patient endorses drinking 5-6 bottles of water per day.  Denies history of DM, palpitations, dizziness.  Now taking Synthroid 137 every morning with her calcium pill.  In the past patient was not taking medication.  Patient states she does not like taking medicine and at times forget to take meds.  Pt has h/o HTN.    Past Medical History:  Diagnosis Date   Allergy    Arthritis    Asthma    Cataract    bilateral eyes   Endometriosis    GERD (gastroesophageal reflux disease)    Headache    migraines   History of tetanus, diphtheria, and acellular pertussis booster vaccination (Tdap)    07/30/2010   Hyperlipidemia    Kidney stones    Pancreatitis     Allergies  Allergen Reactions   Hydromorphone Itching    ROS General: Denies fever, chills, night sweats, changes in weight, changes in appetite  +presyncopal, diaphoretic while on toilet. HEENT: Denies headaches, ear pain, changes in vision, rhinorrhea, sore throat CV: Denies CP, palpitations, SOB, orthopnea Pulm: Denies SOB, cough, wheezing GI: Denies abdominal pain, nausea, vomiting, diarrhea, constipation GU: Denies dysuria, hematuria, frequency, vaginal discharge Msk: Denies muscle cramps, joint pains Neuro: Denies weakness, numbness,  tingling Skin: Denies rashes, bruising Psych: Denies depression, anxiety, hallucinations     Objective:    Blood pressure 138/90, pulse 79, temperature 97.7 F (36.5 C), temperature source Oral, weight 199 lb 6.4 oz (90.4 kg), last menstrual period 06/06/1996, SpO2 99 %. Orthostatic VS for the past 24 hrs (Last 3 readings):  BP- Lying BP- Sitting BP- Standing at 0 minutes  11/05/21 0909 148/86 (!) 142/92 118/88    Gen. Pleasant, well-nourished, in no distress, normal affect   HEENT: [place]/AT, face symmetric, conjunctiva clear, no scleral icterus, PERRLA, EOMI, nares patent without drainage. Neck: No JVD, thyroid surgically absent,  no carotid bruits Lungs: no accessory muscle use, CTAB, no wheezes or rales Cardiovascular: RRR, no m/r/g, no peripheral edema Musculoskeletal: No deformities, no cyanosis or clubbing, normal tone Neuro:  A&Ox3, CN II-XII intact, normal gait Skin:  Warm, no lesions/ rash   Wt Readings from Last 3 Encounters:  11/05/21 199 lb 6.4 oz (90.4 kg)  07/07/21 207 lb 3.2 oz (94 kg)  06/24/21 210 lb 3.2 oz (95.3 kg)    Lab Results  Component Value Date   WBC 7.0 04/17/2021   HGB 14.5 04/17/2021   HCT 43.5 04/17/2021   PLT 299.0 04/17/2021   GLUCOSE 92 06/24/2021   CHOL 177 06/24/2021   TRIG 47.0 06/24/2021   HDL 66.40 06/24/2021   LDLDIRECT 161.8 10/20/2006   LDLCALC 101 (H) 06/24/2021   ALT 15 06/24/2021   AST 20 06/24/2021  NA 138 06/24/2021   K 4.2 06/24/2021   CL 103 06/24/2021   CREATININE 0.95 06/24/2021   BUN 12 06/24/2021   CO2 29 06/24/2021   TSH 23.34 (H) 05/16/2021   HGBA1C 5.9 04/17/2021    Assessment/Plan:  Vasovagal near syncope -Likely 2/2 bearing down while sitting on the toilet.  Also consider thyroid dysfunction. -Discussed the importance of hydration. -Patient advised to take thyroid medication first thing in the morning 30 minutes prior to taking other meds or eating -Obtain orthostatic blood pressure readings.        Orthostatics positive.  Laying down BP 148/86, sitting 142/92, standing BP 118/88 -Given handouts -Follow-up with PCP  - Plan: TSH, CBC with Differential/Platelet, Basic metabolic panel, Hemoglobin A1c  Essential hypertension -elevated -recheck -Discussed the importance of lifestyle modifications -Continue current medications including valsartan-hydrochlorothiazide 160-25 mg daily -Propranolol 60 mg 3 times daily listed however does not appear patient is taking as was prescribed in March 2023 with no refills.  Likely started for migraine prevention.  - Plan: TSH, Basic metabolic panel  Postoperative hypothyroidism  -last TSH on 05/16/2021 23.34. -Continue Synthroid 137 mcg -Discussed the importance of taking medication first thing in the morning 30 minutes prior to eating or taking other meds  - Plan: TSH  F/u prn with PCP  Grier Mitts, MD

## 2021-11-06 ENCOUNTER — Other Ambulatory Visit: Payer: Self-pay

## 2021-11-06 ENCOUNTER — Other Ambulatory Visit: Payer: Self-pay | Admitting: Internal Medicine

## 2021-11-06 DIAGNOSIS — E782 Mixed hyperlipidemia: Secondary | ICD-10-CM

## 2021-11-06 DIAGNOSIS — E039 Hypothyroidism, unspecified: Secondary | ICD-10-CM

## 2021-11-06 DIAGNOSIS — E890 Postprocedural hypothyroidism: Secondary | ICD-10-CM

## 2021-11-07 ENCOUNTER — Ambulatory Visit: Payer: 59 | Admitting: Endocrinology

## 2021-12-07 IMAGING — MR MR THORACIC SPINE W/O CM
4 of 6 series · 13 of 48 positions shown · non-contrast
Comparison: Prior radiograph from 05/30/2012.

CLINICAL DATA: Initial evaluation for left lower extremity pain for
several months.

EXAM:
MRI THORACIC SPINE WITHOUT CONTRAST
TECHNIQUE: Multiplanar, multisequence MR imaging of the thoracic spine was
performed. No intravenous contrast was administered.

[Series 4: T2 · sagittal · 4.0mm · 0.33mm/px · 4 of 12 slices shown (1 of 3)]
[im 1/12]
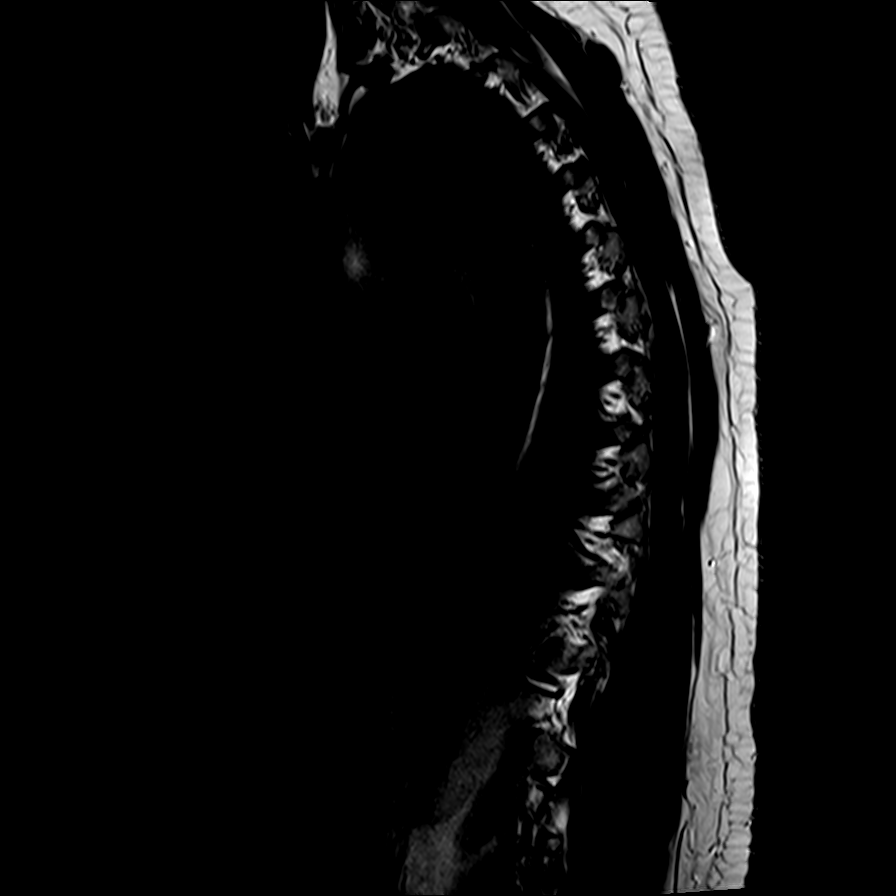
[im 3/12]
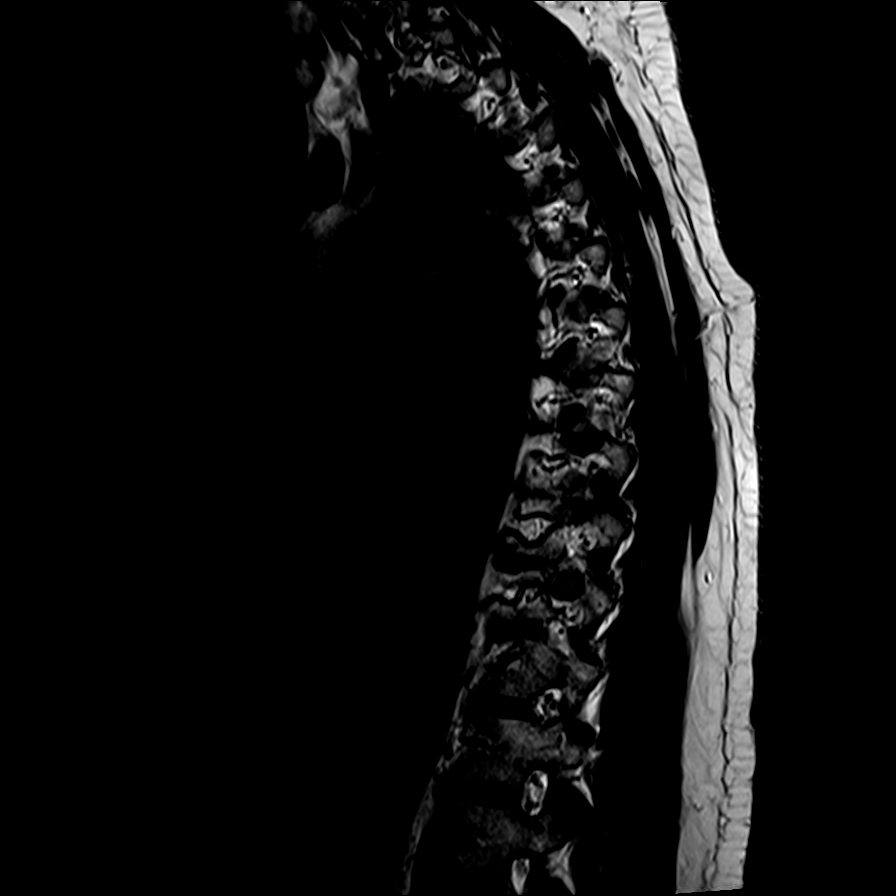
[im 6/12]
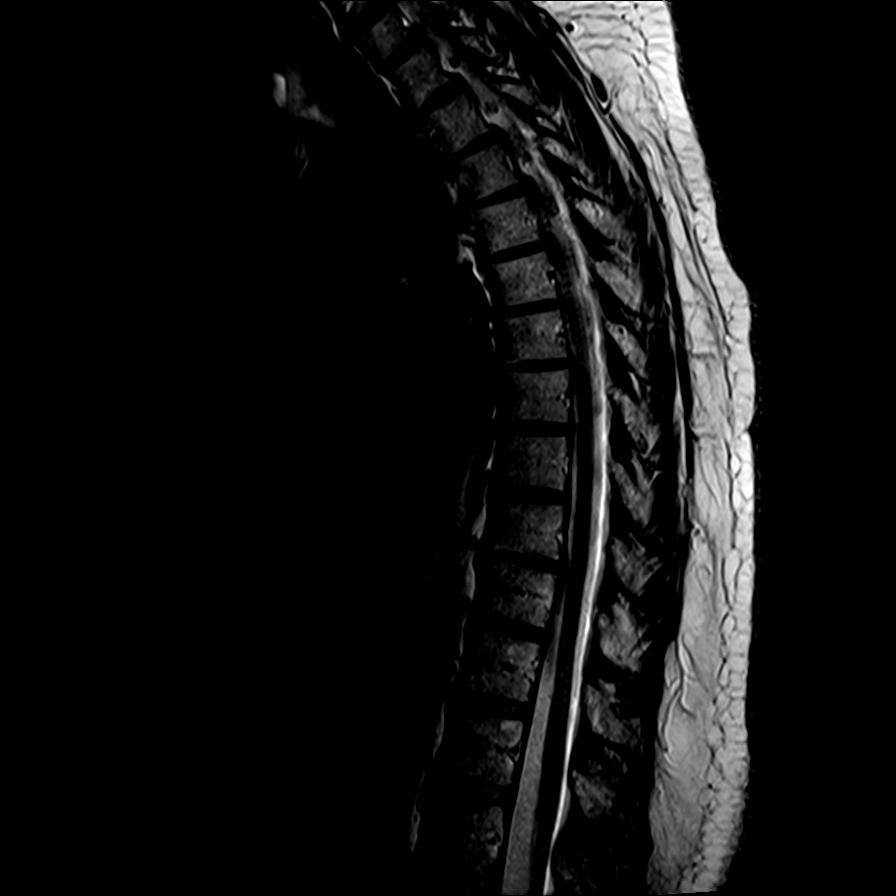
[im 12/12]
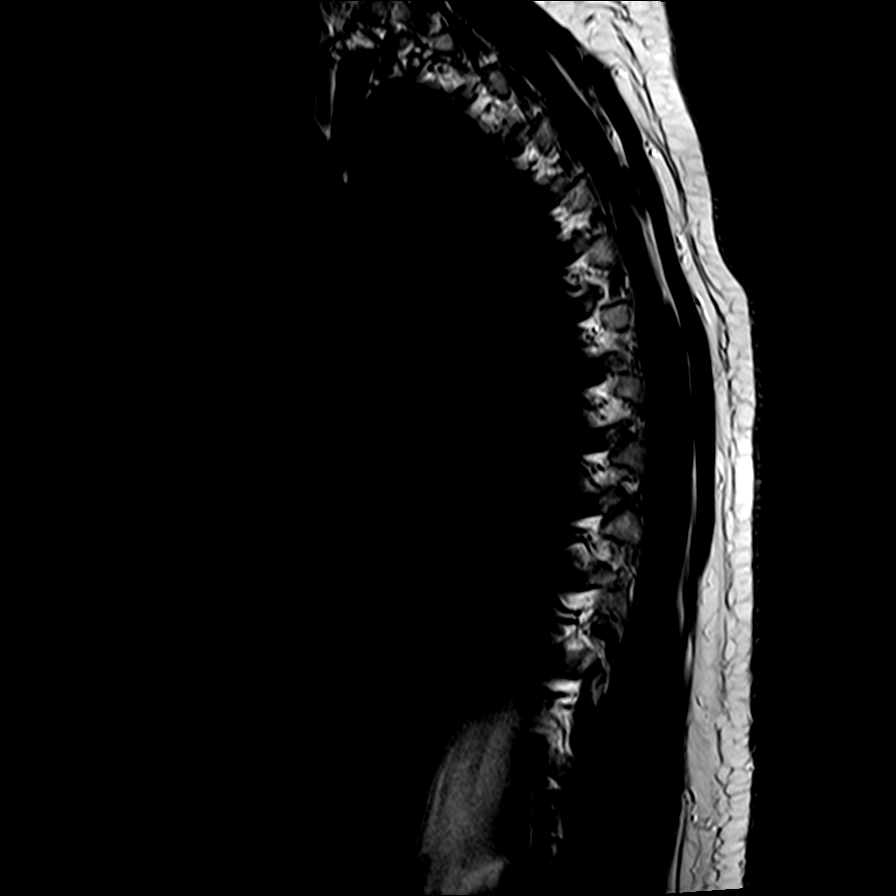

[Series 5: T1 · sagittal · 4.0mm · 0.78mm/px · 3 of 12 slices shown]
[im 1/12]
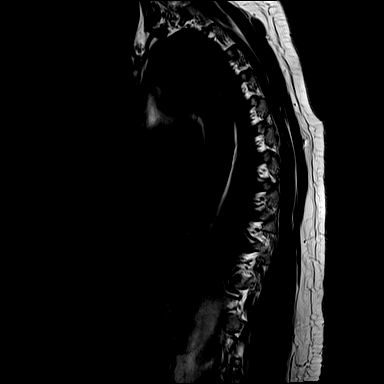
[im 6/12]
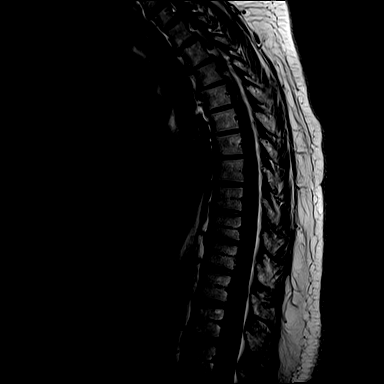
[im 12/12]
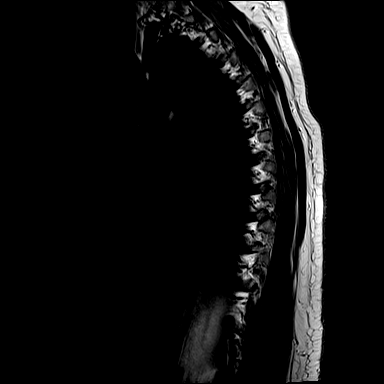

[Series 7: T2 · axial · 4.0mm · 0.39mm/px · z∈[-271,-153]mm · 3 of 31 slices shown (2 of 3)]
[im 5/31]
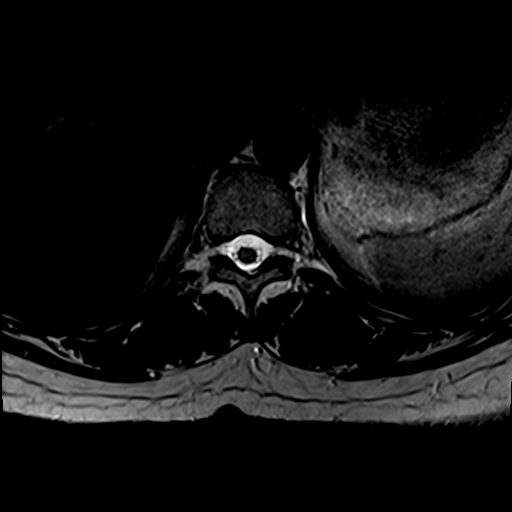
[im 17/31]
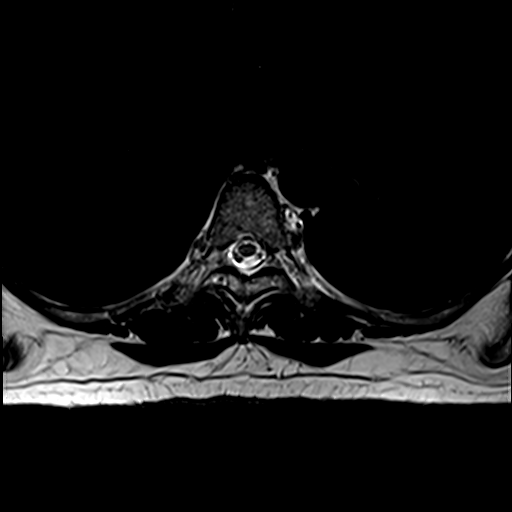
[im 26/31]
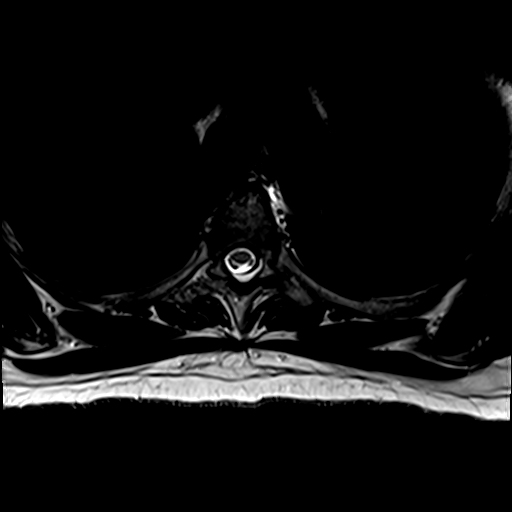

[Series 8: T2 · axial · 4.0mm · 0.39mm/px · z∈[-272,-150]mm · 3 of 31 slices shown (3 of 3)]
[im 5/31]
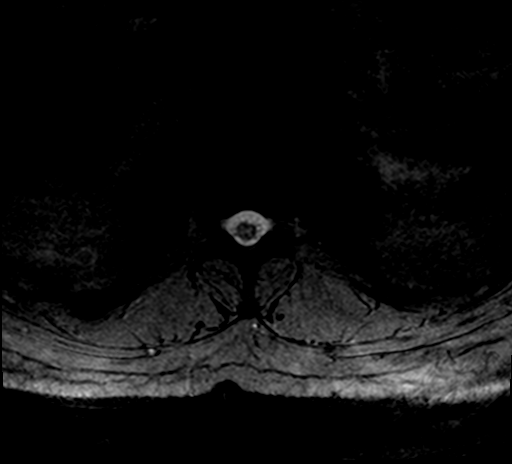
[im 17/31]
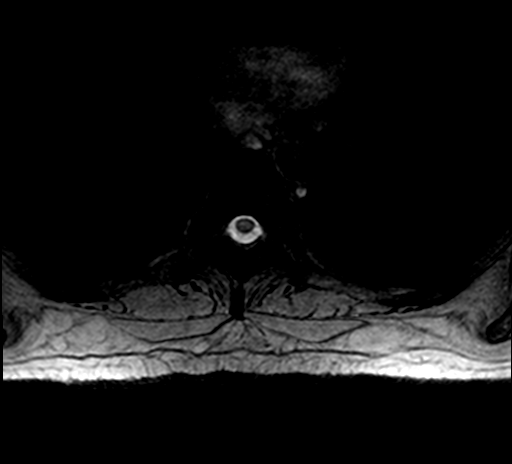
[im 26/31]
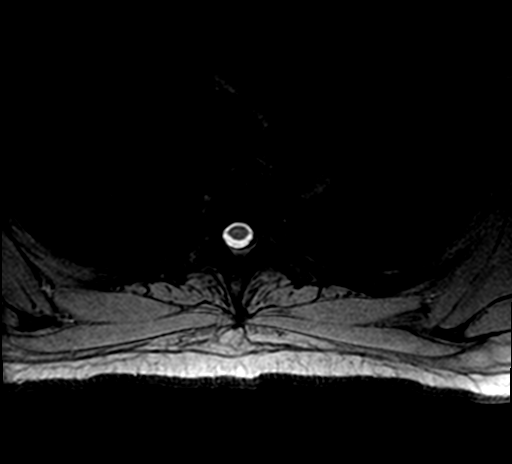

[13 of 48 positions shown; findings below may reference images not displayed]

FINDINGS: Alignment: Physiologic with preservation of the normal thoracic
kyphosis. No significant listhesis.

Vertebrae: Vertebral body height maintained without evidence for
acute or chronic fracture. Bone marrow signal intensity within
normal limits. No discrete or worrisome osseous lesions. Discogenic
reactive endplate changes with mild marrow edema seen about the
anterior aspect of the T9-10 interspace, which could contribute to
underlying back pain. No other abnormal marrow edema.

Cord: Signal intensity within the thoracic spinal cord is normal.
Normal cord caliber morphology.

Paraspinal and other soft tissues: Unremarkable.

Disc levels:

T1-2:  Unremarkable.

T2-3: Unremarkable.

T3-4:  Mild disc bulge.  No canal or foraminal stenosis.

T4-5: No significant disc bulge. Right-sided facet hypertrophy. No
significant stenosis.

T5-6: No significant disc bulge. Right greater than left facet
hypertrophy. No spinal stenosis. Mild right foraminal narrowing.

T6-7: Mild disc bulge. Mild bilateral facet hypertrophy. No
significant canal or foraminal stenosis.

T7-8: No significant disc bulge. Mild facet hypertrophy. No
stenosis.

T8-9: Tiny right paracentral disc protrusion minimally indents the
right ventral thecal sac. Mild bilateral facet hypertrophy. No
significant spinal stenosis. Mild bilateral foraminal narrowing.

T9-10: Mild disc bulge. Mild reactive endplate changes. Mild facet
hypertrophy. No significant spinal stenosis. Mild right foraminal
narrowing.

T10-11:  Negative interspace.  Mild facet hypertrophy.  No stenosis.

T11-12:  Unremarkable.

T12-L1:  Unremarkable.
IMPRESSION: 1. Mild multilevel degenerative spondylosis with multilevel
degenerative disc bulging and facet hypertrophy as above. No
significant spinal stenosis. Mild foraminal narrowing on the right
at T5-6 and T9-10, and bilaterally at T8-9.
2. Reactive endplate changes with associated marrow edema about the
T9-10 interspace, which could contribute to underlying back pain.

## 2021-12-11 ENCOUNTER — Telehealth: Payer: Self-pay | Admitting: Internal Medicine

## 2021-12-11 ENCOUNTER — Other Ambulatory Visit (INDEPENDENT_AMBULATORY_CARE_PROVIDER_SITE_OTHER): Payer: 59

## 2021-12-11 DIAGNOSIS — E039 Hypothyroidism, unspecified: Secondary | ICD-10-CM | POA: Diagnosis not present

## 2021-12-11 LAB — TSH: TSH: 0.02 u[IU]/mL — ABNORMAL LOW (ref 0.35–5.50)

## 2021-12-11 NOTE — Telephone Encounter (Signed)
FMLA form to be filled out--placed in dr's folder.  Fax to number on form upon completion.

## 2021-12-16 NOTE — Telephone Encounter (Signed)
Left message on machine for patient to schedule an appointment for form completion.

## 2021-12-17 ENCOUNTER — Encounter: Payer: Self-pay | Admitting: Internal Medicine

## 2021-12-17 ENCOUNTER — Ambulatory Visit: Payer: 59 | Admitting: Internal Medicine

## 2021-12-17 VITALS — BP 120/70 | HR 95 | Temp 97.9°F | Wt 199.5 lb

## 2021-12-17 DIAGNOSIS — E890 Postprocedural hypothyroidism: Secondary | ICD-10-CM

## 2021-12-17 DIAGNOSIS — R7302 Impaired glucose tolerance (oral): Secondary | ICD-10-CM

## 2021-12-17 DIAGNOSIS — E559 Vitamin D deficiency, unspecified: Secondary | ICD-10-CM | POA: Diagnosis not present

## 2021-12-17 DIAGNOSIS — E785 Hyperlipidemia, unspecified: Secondary | ICD-10-CM | POA: Diagnosis not present

## 2021-12-17 NOTE — Telephone Encounter (Signed)
Form was completed at office visit 12/17/21

## 2021-12-17 NOTE — Progress Notes (Signed)
Established Patient Office Visit     CC/Reason for Visit: Follow-up chronic conditions, requesting FMLA  HPI: Kendra Gonzalez is a 51 y.o. female who is coming in today for the above mentioned reasons. Past Medical History is significant for: Hypertension, hypothyroidism, migraine headaches, impaired glucose tolerance, vitamin D deficiency.  She is feeling well and has no acute concerns or complaints.  She gets occasional migraine headaches and is requesting intermittent FMLA for 1 to 2 days a month.  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  Migraines are very limiting as she has severe light sensitivity and nausea with them.  She saw Dr. Volanda Napoleon recently for a near syncopal episode.  She was found to have positive orthostatics.  TSH was found to be low.  She has been taking levothyroxine with other supplements that could potentially inhibit its absorption.   Past Medical/Surgical History: Past Medical History:  Diagnosis Date   Allergy    Arthritis    Asthma    Cataract    bilateral eyes   Endometriosis    GERD (gastroesophageal reflux disease)    Headache    migraines   History of tetanus, diphtheria, and acellular pertussis booster vaccination (Tdap)    07/30/2010   Hyperlipidemia    Kidney stones    Pancreatitis     Past Surgical History:  Procedure Laterality Date   ABDOMINAL HYSTERECTOMY     arthroscopic knee surgery      carpel tunnel     CHOLECYSTECTOMY     ERCP  01/08/2012   Procedure: ENDOSCOPIC RETROGRADE CHOLANGIOPANCREATOGRAPHY (ERCP);  Surgeon: Beryle Beams, MD;  Location: [facility] ENDOSCOPY;  Service: Endoscopy;  Laterality: N/A;   HIP ARTHROPLASTY     left   HIP SURGERY     oophrectomy  2011   secondary to cysts   THYROIDECTOMY N/A 11/21/2020   Procedure: TOTAL THYROIDECTOMY;  Surgeon: Armandina Gemma, MD;  Location: [facility] ORS;  Service: General;  Laterality: N/A;   TUBAL LIGATION     UPPER GASTROINTESTINAL ENDOSCOPY  2016     Social History:  reports that she has never smoked. She has never used smokeless tobacco. She reports that she does not drink alcohol and does not use drugs.  Allergies: Allergies  Allergen Reactions   Hydromorphone Itching    Family History:  Family History  Problem Relation Age of Onset   Heart disease Daughter    Hypertension Father    Hypertension Sister    Hypertension Brother    Hypertension Maternal Grandfather    Heart disease Maternal Grandfather    Hypertension Paternal Grandfather    Hypertension Brother    Hypertension Sister    Thyroid disease Neg Hx    Colon polyps Neg Hx    Colon cancer Neg Hx    Esophageal cancer Neg Hx    Rectal cancer Neg Hx    Stomach cancer Neg Hx      Current Outpatient Medications:    acetaminophen (TYLENOL) 500 MG tablet, Take 1,000 mg by mouth every 8 (eight) hours as needed for moderate pain., Disp: , Rfl:    albuterol (PROVENTIL) (2.5 MG/3ML) 0.083% nebulizer solution, Take 3 mLs (2.5 mg total) by nebulization every 6 (six) hours as needed for wheezing or shortness of breath., Disp: 150 mL, Rfl: 1   albuterol (VENTOLIN HFA) 108 (90 Base) MCG/ACT inhaler, Inhale 2 puffs into the lungs every 6 (six) hours as needed for wheezing or shortness of breath.,  Disp: 18 g, Rfl: 2   atorvastatin (LIPITOR) 40 MG tablet, Take 1 tablet (40 mg total) by mouth daily., Disp: 90 tablet, Rfl: 1   baclofen (LIORESAL) 10 MG tablet, Take 10 mg by mouth 2 (two) times daily as needed (migraines)., Disp: , Rfl:    cyclobenzaprine (FLEXERIL) 5 MG tablet, Take 1 tablet (5 mg total) by mouth at bedtime as needed for muscle spasms., Disp: 30 tablet, Rfl: 0   diclofenac (VOLTAREN) 75 MG EC tablet, Take 1 tablet (75 mg total) by mouth 2 (two) times daily., Disp: 30 tablet, Rfl: 0   diclofenac sodium (VOLTAREN) 1 % GEL, Apply up to three times a day as needed to large joint., Disp: 3 Tube, Rfl: 4   fluticasone-salmeterol (ADVAIR HFA) 115-21 MCG/ACT inhaler,  Inhale 2 puffs into the lungs 2 (two) times daily., Disp: 1 each, Rfl: 2   Galcanezumab-gnlm (EMGALITY) 120 MG/ML SOAJ, Inject 120 mg into the skin every 28 (twenty-eight) days., Disp: 1.12 mL, Rfl: 11   levothyroxine (SYNTHROID) 137 MCG tablet, Take 1 tablet (137 mcg total) by mouth daily before breakfast., Disp: 90 tablet, Rfl: 3   magnesium gluconate (MAGONATE) 500 MG tablet, Take 500 mg by mouth at bedtime., Disp: , Rfl:    pantoprazole (PROTONIX) 40 MG tablet, Take 40 mg by mouth daily., Disp: , Rfl:    propranolol (INDERAL) 60 MG tablet, Take 1 tablet (60 mg total) by mouth 3 (three) times daily., Disp: 30 tablet, Rfl: 0   rizatriptan (MAXALT-MLT) 10 MG disintegrating tablet, Take 1 tablet (10 mg total) by mouth as needed for migraine (May repeat after 2 hours.  Maximum 2 tablets). May repeat in 2 hours if needed, Disp: 10 tablet, Rfl: 4   tiZANidine (ZANAFLEX) 2 MG tablet, Take 1-2 tablets (2-4 mg total) by mouth every 8 (eight) hours as needed for muscle spasms., Disp: 20 tablet, Rfl: 0   valsartan-hydrochlorothiazide (DIOVAN-HCT) 160-25 MG tablet, Take 1 tablet by mouth daily., Disp: 90 tablet, Rfl: 1   zonisamide (ZONEGRAN) 25 MG capsule, Take 100 mg by mouth at bedtime., Disp: , Rfl:    gabapentin (NEURONTIN) 300 MG capsule, Take 1 capsule (300 mg total) by mouth 3 (three) times daily., Disp: 90 capsule, Rfl: 0   promethazine (PHENERGAN) 25 MG tablet, Take 1 tablet (25 mg total) by mouth once for 1 dose., Disp: 2 tablet, Rfl: 0  Review of Systems:  Constitutional: Denies fever, chills, diaphoresis, appetite change and fatigue.  HEENT: Denies photophobia, eye pain, redness, hearing loss, ear pain, congestion, sore throat, rhinorrhea, sneezing, mouth sores, trouble swallowing, neck pain, neck stiffness and tinnitus.   Respiratory: Denies SOB, DOE, cough, chest tightness,  and wheezing.   Cardiovascular: Denies chest pain, palpitations and leg swelling.  Gastrointestinal: Denies nausea,  vomiting, abdominal pain, diarrhea, constipation, blood in stool and abdominal distention.  Genitourinary: Denies dysuria, urgency, frequency, hematuria, flank pain and difficulty urinating.  Endocrine: Denies: hot or cold intolerance, sweats, changes in hair or nails, polyuria, polydipsia. Musculoskeletal: Denies myalgias, back pain, joint swelling, arthralgias and gait problem.  Skin: Denies pallor, rash and wound.  Neurological: Denies dizziness, seizures, syncope, weakness, light-headedness, numbness and headaches.  Hematological: Denies adenopathy. Easy bruising, personal or family bleeding history  Psychiatric/Behavioral: Denies suicidal ideation, mood changes, confusion, nervousness, sleep disturbance and agitation    Physical Exam: Vitals:   12/17/21 1334  BP: 120/70  Pulse: 95  Temp: 97.9 F (36.6 C)  TempSrc: Oral  SpO2: 100%  Weight: 199 lb 8  oz (90.5 kg)    Body mass index is 34.24 kg/m.   Constitutional: NAD, calm, comfortable Eyes: PERRL, lids and conjunctivae normal ENMT: Mucous membranes are moist.  Respiratory: clear to auscultation bilaterally, no wheezing, no crackles. Normal respiratory effort. No accessory muscle use.  Cardiovascular: Regular rate and rhythm, no murmurs / rubs / gallops. No extremity edema.  Psychiatric: Normal judgment and insight. Alert and oriented x 3. Normal mood.    Impression and Plan:  Postoperative hypothyroidism  IGT (impaired glucose tolerance)  Hyperlipidemia, unspecified hyperlipidemia type  Vitamin D deficiency  Hypertension  -FMLA forms have been filled out, copy will be placed in chart. -Recent A1c was 6.1. -Blood pressure is currently well controlled. -I have advised that correct way to take levothyroxine is first thing in the morning 30 minutes before food, drink or medications.  I will have her do this for 6 weeks and then return for repeat TSH. -I will follow-up with her in 6 months.    Time spent:31  minutes reviewing chart, interviewing and examining patient and formulating plan of care.     Lelon Frohlich, MD [facility]

## 2022-01-07 NOTE — Progress Notes (Signed)
NEUROLOGY FOLLOW UP OFFICE NOTE  Kendra Gonzalez  Assessment/Plan:   Migraine without aura, without status migrainosus, intractable Cervicalgia   Migraine prevention:  Emgality.  Due to HTN, would not use Aimovig Migraine rescue:  Will have her try samples of Ubrelvy '100mg'$  which will less likely cause drowsiness.  Zofran. Limit use of pain relievers to no more than 2 days out of week to prevent risk of rebound or medication-overuse headache. Keep headache diary Follow up 6 months       Subjective:  Kendra Gonzalez is a 51 year old female with hypothyroidism, HTN, asthma, cataracts and history of kidney stones who follows up for migraines.  UPDATE: Last visit, she was referred to physical therapy for neck pain.   Last visit, prescribed Emgality.  It was denied by insurance so she was started on propranolol instead while we appealed.  Eventually Emgality was approved.  Maxalt and Zofran seems to help as rescue therapy. Intensity:  moderate Duration:  1 day with rizatriptan (makes her sleepy) Frequency:  2 a month Frequency of abortive medication: 2 a month Current NSAIDS/analgesics:  none Current triptans:  rizatriptan '10mg'$  Current ergotamine:  none Current anti-emetic:  Zofran '4mg'$  Current muscle relaxants:   Current Antihypertensive medications:  valsartan-HCTZ Current Antidepressant medications:  none Current Anticonvulsant medications:  none Current anti-CGRP:  Emgality Current Vitamins/Herbal/Supplements:  magnesium gluconate Current Antihistamines/Decongestants:  none Other therapy: physical therapy for neck pain Hormone/birth control:  none  Caffeine:  No coffee.  May drink tea 2 days a week Alcohol:  No Smoker:  No Diet:  four 16 oz water bottles daily.  No soda.  Does not skip meals Exercise:  walks or runs 30 minutes Depression:  no; Anxiety:  no Other pain:  left hip pain Sleep hygiene:  10 PM to 6 AM.  Toss and turns due to hip  pain  HISTORY:  Onset:  51 years old Location:  starts in back of neck and radiates up to behind either eye (usually left side) Quality:  pressure Intensity:  10/10 Aura:  absent Prodrome:  absent Associated symptoms:  Nausea, photophobia, phonophobia, osmophobia, blurred vision.  She denies associated vomiting, unilateral numbness or weakness. Duration:  2-3 days Frequency:  1 to 2 times a week Frequency of abortive medication: Excedrin 3 days a week Triggers:  scents (perfumes), seasonal allergies Relieving factors:  resting in dark quiet room Activity:  aggravates   MRI of brain with and without contrast on 07/18/2019 showed minimal nonspecific T2 signal changes in the cerebral white matter and partially empty sella.  MRI of c-spine on 08/19/2019 revealed mild multilevel cervical spondylosis with mild neural foraminal narrowing but without significant spinal canal stenosis or cord impingement     Past NSAIDS/analgesics:  Fioricet, Excedrin, ibuprofen, indomethacin, meloxicam, diclofenac '75mg'$ , naproxen, tramadol, acetaminophen Past abortive triptans:  sumatriptan '50mg'$  Past abortive ergotamine:  none Past muscle relaxants:  baclofen, cyclobenzaprine '5mg'$  QHS (muscle spasms), tizanidine 2-'4mg'$  PRN (muscle spasms) Past anti-emetic:  Zofran, promethazine Past antihypertensive medications:  propranolol Past antidepressant medications:  venlafaxine Past anticonvulsant medications:  zonisamide, gabapentin, pregablin Past anti-CGRP:  none Past vitamins/Herbal/Supplements:  none Past antihistamines/decongestants:  Xyzal Other past therapies:  none    Family history of headache:  none  PAST MEDICAL HISTORY: Past Medical History:  Diagnosis Date   Allergy    Arthritis    Asthma    Cataract    bilateral eyes   Endometriosis    GERD (gastroesophageal reflux disease)  Headache    migraines   History of tetanus, diphtheria, and acellular pertussis booster vaccination (Tdap)     07/30/2010   Hyperlipidemia    Kidney stones    Pancreatitis     MEDICATIONS: Current Outpatient Medications on File Prior to Visit  Medication Sig Dispense Refill   acetaminophen (TYLENOL) 500 MG tablet Take 1,000 mg by mouth every 8 (eight) hours as needed for moderate pain.     albuterol (PROVENTIL) (2.5 MG/3ML) 0.083% nebulizer solution Take 3 mLs (2.5 mg total) by nebulization every 6 (six) hours as needed for wheezing or shortness of breath. 150 mL 1   albuterol (VENTOLIN HFA) 108 (90 Base) MCG/ACT inhaler Inhale 2 puffs into the lungs every 6 (six) hours as needed for wheezing or shortness of breath. 18 g 2   atorvastatin (LIPITOR) 40 MG tablet Take 1 tablet (40 mg total) by mouth daily. 90 tablet 1   baclofen (LIORESAL) 10 MG tablet Take 10 mg by mouth 2 (two) times daily as needed (migraines).     cyclobenzaprine (FLEXERIL) 5 MG tablet Take 1 tablet (5 mg total) by mouth at bedtime as needed for muscle spasms. 30 tablet 0   diclofenac (VOLTAREN) 75 MG EC tablet Take 1 tablet (75 mg total) by mouth 2 (two) times daily. 30 tablet 0   diclofenac sodium (VOLTAREN) 1 % GEL Apply up to three times a day as needed to large joint. 3 Tube 4   fluticasone-salmeterol (ADVAIR HFA) 115-21 MCG/ACT inhaler Inhale 2 puffs into the lungs 2 (two) times daily. 1 each 2   gabapentin (NEURONTIN) 300 MG capsule Take 1 capsule (300 mg total) by mouth 3 (three) times daily. 90 capsule 0   Galcanezumab-gnlm (EMGALITY) 120 MG/ML SOAJ Inject 120 mg into the skin every 28 (twenty-eight) days. 1.12 mL 11   levothyroxine (SYNTHROID) 137 MCG tablet Take 1 tablet (137 mcg total) by mouth daily before breakfast. 90 tablet 3   magnesium gluconate (MAGONATE) 500 MG tablet Take 500 mg by mouth at bedtime.     pantoprazole (PROTONIX) 40 MG tablet Take 40 mg by mouth daily.     promethazine (PHENERGAN) 25 MG tablet Take 1 tablet (25 mg total) by mouth once for 1 dose. 2 tablet 0   propranolol (INDERAL) 60 MG tablet Take 1  tablet (60 mg total) by mouth 3 (three) times daily. 30 tablet 0   rizatriptan (MAXALT-MLT) 10 MG disintegrating tablet Take 1 tablet (10 mg total) by mouth as needed for migraine (May repeat after 2 hours.  Maximum 2 tablets). May repeat in 2 hours if needed 10 tablet 4   tiZANidine (ZANAFLEX) 2 MG tablet Take 1-2 tablets (2-4 mg total) by mouth every 8 (eight) hours as needed for muscle spasms. 20 tablet 0   valsartan-hydrochlorothiazide (DIOVAN-HCT) 160-25 MG tablet Take 1 tablet by mouth daily. 90 tablet 1   zonisamide (ZONEGRAN) 25 MG capsule Take 100 mg by mouth at bedtime.     [DISCONTINUED] methimazole (TAPAZOLE) 10 MG tablet Take 1 tablet (10 mg total) by mouth 2 (two) times daily. (Patient not taking: Reported on 10/29/2020) 180 tablet 3   No current facility-administered medications on file prior to visit.    ALLERGIES: Allergies  Allergen Reactions   Hydromorphone Itching    FAMILY HISTORY: Family History  Problem Relation Age of Onset   Heart disease Daughter    Hypertension Father    Hypertension Sister    Hypertension Brother    Hypertension Maternal Grandfather  Heart disease Maternal Grandfather    Hypertension Paternal Grandfather    Hypertension Brother    Hypertension Sister    Thyroid disease Neg Hx    Colon polyps Neg Hx    Colon cancer Neg Hx    Esophageal cancer Neg Hx    Rectal cancer Neg Hx    Stomach cancer Neg Hx       Objective:  Blood pressure (!) 142/90, pulse 76, height '5\' 4"'$  (1.626 m), weight 198 lb (89.8 kg), last menstrual period 06/06/1996, SpO2 100 %. General: No acute distress.  Patient appears well-groomed.    Metta Clines, DO  CC: Lelon Frohlich, MD

## 2022-01-08 ENCOUNTER — Ambulatory Visit: Payer: 59 | Admitting: Neurology

## 2022-01-12 ENCOUNTER — Encounter: Payer: Self-pay | Admitting: Neurology

## 2022-01-12 ENCOUNTER — Ambulatory Visit (INDEPENDENT_AMBULATORY_CARE_PROVIDER_SITE_OTHER): Payer: 59 | Admitting: Neurology

## 2022-01-12 VITALS — BP 142/90 | HR 76 | Ht 64.0 in | Wt 198.0 lb

## 2022-01-12 DIAGNOSIS — G43009 Migraine without aura, not intractable, without status migrainosus: Secondary | ICD-10-CM | POA: Diagnosis not present

## 2022-01-12 NOTE — Patient Instructions (Signed)
  Continue Emgality Take Ubrelvy '100mg'$  at earliest onset of headache.  May repeat dose once in 2 hours if needed.  Maximum 2 tablets in 24 hours.  Let me know if effective.   Limit use of pain relievers to no more than 2 days out of the week.  These medications include acetaminophen, NSAIDs (ibuprofen/Advil/Motrin, naproxen/Aleve, triptans (Imitrex/sumatriptan), Excedrin, and narcotics.  This will help reduce risk of rebound headaches. Be aware of common food triggers:  - Caffeine:  coffee, black tea, cola, Mt. Dew  - Chocolate  - Dairy:  aged cheeses (brie, blue, cheddar, gouda, [place], provolone, [place], Swiss, etc), chocolate milk, buttermilk, sour cream, limit eggs and yogurt  - Nuts, peanut butter  - Alcohol  - Cereals/grains:  FRESH breads (fresh bagels, sourdough, doughnuts), yeast productions  - Processed/canned/aged/cured meats (pre-packaged deli meats, hotdogs)  - MSG/glutamate:  soy sauce, flavor enhancer, pickled/preserved/marinated foods  - Sweeteners:  aspartame (Equal, Nutrasweet).  Sugar and Splenda are okay  - Vegetables:  legumes (lima beans, lentils, snow peas, fava beans, pinto peans, peas, garbanzo beans), sauerkraut, onions, olives, pickles  - Fruit:  avocados, bananas, citrus fruit (orange, lemon, grapefruit), mango  - Other:  Frozen meals, macaroni and cheese Routine exercise Stay adequately hydrated (aim for 64 oz water daily) Keep headache diary Maintain proper stress management Maintain proper sleep hygiene Do not skip meals Consider supplements:  magnesium citrate '400mg'$  daily, riboflavin '400mg'$  daily, coenzyme Q10 '100mg'$  three times daily.

## 2022-01-13 ENCOUNTER — Other Ambulatory Visit: Payer: Self-pay | Admitting: Internal Medicine

## 2022-01-13 DIAGNOSIS — R519 Headache, unspecified: Secondary | ICD-10-CM

## 2022-01-13 MED ORDER — GABAPENTIN 300 MG PO CAPS: 300.0000 mg | ORAL_CAPSULE | Freq: Three times a day (TID) | ORAL | 0 refills | Status: DC

## 2022-01-13 MED ORDER — BACLOFEN 10 MG PO TABS: 10.0000 mg | ORAL_TABLET | Freq: Two times a day (BID) | ORAL | 1 refills | Status: DC | PRN

## 2022-01-20 ENCOUNTER — Ambulatory Visit (INDEPENDENT_AMBULATORY_CARE_PROVIDER_SITE_OTHER): Payer: 59 | Admitting: Internal Medicine

## 2022-01-20 ENCOUNTER — Encounter: Payer: Self-pay | Admitting: Internal Medicine

## 2022-01-20 VITALS — BP 110/80 | HR 80 | Temp 98.7°F | Wt 195.2 lb

## 2022-01-20 DIAGNOSIS — K5900 Constipation, unspecified: Secondary | ICD-10-CM

## 2022-01-20 NOTE — Progress Notes (Signed)
Established Patient Office Visit     CC/Reason for Visit: Discuss constipation  HPI: Kendra Gonzalez is a 51 y.o. female who is coming in today for the above mentioned reasons.  She scheduled this visit yesterday as she has not been able to have a bowel movement in 2 weeks.  She finally had a bowel movement this morning, describes very hard stools with a scant amount of blood in it.  She has tried some MiraLAX and enemas over the past few days.   Past Medical/Surgical History: Past Medical History:  Diagnosis Date   Allergy    Arthritis    Asthma    Cataract    bilateral eyes   Endometriosis    GERD (gastroesophageal reflux disease)    Headache    migraines   History of tetanus, diphtheria, and acellular pertussis booster vaccination (Tdap)    07/30/2010   Hyperlipidemia    Kidney stones    Pancreatitis     Past Surgical History:  Procedure Laterality Date   ABDOMINAL HYSTERECTOMY     arthroscopic knee surgery      carpel tunnel     CHOLECYSTECTOMY     ERCP  01/08/2012   Procedure: ENDOSCOPIC RETROGRADE CHOLANGIOPANCREATOGRAPHY (ERCP);  Surgeon: Beryle Beams, MD;  Location: [facility] ENDOSCOPY;  Service: Endoscopy;  Laterality: N/A;   HIP ARTHROPLASTY     left   HIP SURGERY     oophrectomy  2011   secondary to cysts   THYROIDECTOMY N/A 11/21/2020   Procedure: TOTAL THYROIDECTOMY;  Surgeon: Armandina Gemma, MD;  Location: [facility] ORS;  Service: General;  Laterality: N/A;   TUBAL LIGATION     UPPER GASTROINTESTINAL ENDOSCOPY  2016    Social History:  reports that she has never smoked. She has never used smokeless tobacco. She reports that she does not drink alcohol and does not use drugs.  Allergies: Allergies  Allergen Reactions   Hydromorphone Itching    Family History:  Family History  Problem Relation Age of Onset   Heart disease Daughter    Hypertension Father    Hypertension Sister    Hypertension Brother    Hypertension Maternal Grandfather     Heart disease Maternal Grandfather    Hypertension Paternal Grandfather    Hypertension Brother    Hypertension Sister    Thyroid disease Neg Hx    Colon polyps Neg Hx    Colon cancer Neg Hx    Esophageal cancer Neg Hx    Rectal cancer Neg Hx    Stomach cancer Neg Hx      Current Outpatient Medications:    acetaminophen (TYLENOL) 500 MG tablet, Take 1,000 mg by mouth every 8 (eight) hours as needed for moderate pain., Disp: , Rfl:    albuterol (PROVENTIL) (2.5 MG/3ML) 0.083% nebulizer solution, Take 3 mLs (2.5 mg total) by nebulization every 6 (six) hours as needed for wheezing or shortness of breath., Disp: 150 mL, Rfl: 1   albuterol (VENTOLIN HFA) 108 (90 Base) MCG/ACT inhaler, Inhale 2 puffs into the lungs every 6 (six) hours as needed for wheezing or shortness of breath., Disp: 18 g, Rfl: 2   baclofen (LIORESAL) 10 MG tablet, Take 1 tablet (10 mg total) by mouth 2 (two) times daily as needed (migraines)., Disp: 90 each, Rfl: 1   fluticasone-salmeterol (ADVAIR HFA) 115-21 MCG/ACT inhaler, Inhale 2 puffs into the lungs 2 (two) times daily., Disp: 1 each, Rfl: 2   gabapentin (NEURONTIN) 300 MG capsule, Take  1 capsule (300 mg total) by mouth 3 (three) times daily., Disp: 90 capsule, Rfl: 0   Galcanezumab-gnlm (EMGALITY) 120 MG/ML SOAJ, Inject 120 mg into the skin every 28 (twenty-eight) days., Disp: 1.12 mL, Rfl: 11   levothyroxine (SYNTHROID) 137 MCG tablet, Take 1 tablet (137 mcg total) by mouth daily before breakfast., Disp: 90 tablet, Rfl: 3   magnesium gluconate (MAGONATE) 500 MG tablet, Take 500 mg by mouth at bedtime., Disp: , Rfl:    rizatriptan (MAXALT-MLT) 10 MG disintegrating tablet, Take 1 tablet (10 mg total) by mouth as needed for migraine (May repeat after 2 hours.  Maximum 2 tablets). May repeat in 2 hours if needed, Disp: 10 tablet, Rfl: 4   valsartan-hydrochlorothiazide (DIOVAN-HCT) 160-25 MG tablet, Take 1 tablet by mouth daily., Disp: 90 tablet, Rfl: 1   promethazine  (PHENERGAN) 25 MG tablet, Take 1 tablet (25 mg total) by mouth once for 1 dose., Disp: 2 tablet, Rfl: 0  Review of Systems:  Constitutional: Denies fever, chills, diaphoresis, appetite change and fatigue.  HEENT: Denies photophobia, eye pain, redness, hearing loss, ear pain, congestion, sore throat, rhinorrhea, sneezing, mouth sores, trouble swallowing, neck pain, neck stiffness and tinnitus.   Respiratory: Denies SOB, DOE, cough, chest tightness,  and wheezing.   Cardiovascular: Denies chest pain, palpitations and leg swelling.  Gastrointestinal: Denies nausea, vomiting, abdominal pain, diarrhea and abdominal distention.  Genitourinary: Denies dysuria, urgency, frequency, hematuria, flank pain and difficulty urinating.  Endocrine: Denies: hot or cold intolerance, sweats, changes in hair or nails, polyuria, polydipsia. Musculoskeletal: Denies myalgias, back pain, joint swelling, arthralgias and gait problem.  Skin: Denies pallor, rash and wound.  Neurological: Denies dizziness, seizures, syncope, weakness, light-headedness, numbness and headaches.  Hematological: Denies adenopathy. Easy bruising, personal or family bleeding history  Psychiatric/Behavioral: Denies suicidal ideation, mood changes, confusion, nervousness, sleep disturbance and agitation    Physical Exam: Vitals:   01/20/22 1310  BP: 110/80  Pulse: 80  Temp: 98.7 F (37.1 C)  TempSrc: Oral  SpO2: 99%  Weight: 195 lb 3.2 oz (88.5 kg)    Body mass index is 33.51 kg/m.   Constitutional: NAD, calm, comfortable Eyes: PERRL, lids and conjunctivae normal ENMT: Mucous membranes are moist.  Abdomen: no tenderness, no masses palpated. No hepatosplenomegaly. Bowel sounds positive.   Psychiatric: Normal judgment and insight. Alert and oriented x 3. Normal mood.    Impression and Plan:  Constipation, unspecified constipation type  -She will increase fluid and fiber intake. -She will start MiraLAX and Colace twice daily  and titrate with a goal of a soft bowel movement at least every other day. -I suspect the scant amount of blood in her stool is probably due to an anal fissure.  She will monitor and let me know if it persists.  Time spent:21 minutes reviewing chart, interviewing and examining patient and formulating plan of care.      Lelon Frohlich, MD [facility]

## 2022-01-28 ENCOUNTER — Ambulatory Visit (INDEPENDENT_AMBULATORY_CARE_PROVIDER_SITE_OTHER): Payer: 59

## 2022-01-28 ENCOUNTER — Other Ambulatory Visit: Payer: Self-pay | Admitting: *Deleted

## 2022-01-28 ENCOUNTER — Encounter: Payer: Self-pay | Admitting: Orthopaedic Surgery

## 2022-01-28 ENCOUNTER — Ambulatory Visit (INDEPENDENT_AMBULATORY_CARE_PROVIDER_SITE_OTHER): Payer: 59 | Admitting: Orthopaedic Surgery

## 2022-01-28 ENCOUNTER — Other Ambulatory Visit (INDEPENDENT_AMBULATORY_CARE_PROVIDER_SITE_OTHER): Payer: 59

## 2022-01-28 DIAGNOSIS — M25551 Pain in right hip: Secondary | ICD-10-CM

## 2022-01-28 DIAGNOSIS — E039 Hypothyroidism, unspecified: Secondary | ICD-10-CM

## 2022-01-28 DIAGNOSIS — G8929 Other chronic pain: Secondary | ICD-10-CM

## 2022-01-28 DIAGNOSIS — E890 Postprocedural hypothyroidism: Secondary | ICD-10-CM

## 2022-01-28 DIAGNOSIS — M25561 Pain in right knee: Secondary | ICD-10-CM

## 2022-01-28 LAB — TSH: TSH: 0.01 u[IU]/mL — ABNORMAL LOW (ref 0.35–5.50)

## 2022-01-28 MED ORDER — BUPIVACAINE HCL 0.25 % IJ SOLN
2.0000 mL | INTRAMUSCULAR | Status: AC | PRN
  Administered 2022-01-28: 2 mL via INTRA_ARTICULAR

## 2022-01-28 MED ORDER — LIDOCAINE HCL 1 % IJ SOLN
2.0000 mL | INTRAMUSCULAR | Status: AC | PRN
  Administered 2022-01-28: 2 mL

## 2022-01-28 MED ORDER — LEVOTHYROXINE SODIUM 125 MCG PO TABS: 125.0000 ug | ORAL_TABLET | Freq: Every day | ORAL | 1 refills | Status: DC

## 2022-01-28 MED ORDER — METHYLPREDNISOLONE ACETATE 40 MG/ML IJ SUSP
80.0000 mg | INTRAMUSCULAR | Status: AC | PRN
  Administered 2022-01-28: 80 mg via INTRA_ARTICULAR

## 2022-01-28 NOTE — Progress Notes (Signed)
Office Visit Note   Patient: Kendra Gonzalez           Date of Birth: [birth year]           MRN: [id number] Visit Date: 01/28/2022              Requested by: Kendra Gonzalez, Kendra Halsted, MD [place],  [place] [address] PCP: Kendra Gonzalez, Kendra Halsted, MD   Assessment & Plan: Visit Diagnoses:  1. Chronic pain of right knee   2. Pain of right hip   3. Pain in right hip     Plan: Kendra Gonzalez has been experiencing some pain in the area of her lateral right hip for several months.  She seems to think it is "getting worse".  She denies any history of injury or trauma.  She denies any back or buttock pain.  She is not experiencing any groin pain.  She has had a prior left total hip replacement performed at [facility] about 4 years ago without any problems.  Sometimes the pain in the area of her right hip is is sharp and more localized laterally.  She has had gabapentin which she notes is not helping and taking Tylenol which gives her some minimal relief.  On occasion she has some swelling and discomfort in her right knee.  Her exam is consistent with greater trochanteric bursitis in the area of the right hip.  There is always a possibility that some of this is referred from her hip as she does have some arthritis noted by plain film.  We will plan on injecting the greater trochanteric region and monitor her response.  She has minimal degenerative change in her right knee and that does not appear to be that symptomatic  Follow-Up Instructions: Return if symptoms worsen or fail to improve.   Orders:  Orders Placed This Encounter  Procedures   Large Joint Inj: R greater trochanter   XR HIP UNILAT W OR W/O PELVIS 2-3 VIEWS RIGHT   XR KNEE 3 VIEW RIGHT   No orders of the defined types were placed in this encounter.     Procedures: Large Joint Inj: R greater trochanter on 01/28/2022 10:40 AM Indications: pain and diagnostic evaluation Details: 25 G 1.5 in  needle  Arthrogram: No  Medications: 2 mL lidocaine 1 %; 80 mg methylPREDNISolone acetate 40 MG/ML; 2 mL bupivacaine 0.25 % Procedure, treatment alternatives, risks and benefits explained, specific risks discussed. Consent was given by the patient. Immediately prior to procedure a time out was called to verify the correct patient, procedure, equipment, support staff and site/side marked as required. Patient was prepped and draped in the usual sterile fashion.       Clinical Data: No additional findings.   Subjective: Chief Complaint  Patient presents with   Right Hip - Pain   Right Knee - Pain  Several month history of pain along the lateral aspect of her right hip without injury or trauma.  No fever or chills.  No groin or thigh pain.  Has had some pain along the lateral thigh extending as far distally as her knee.  Has tried Tylenol with some relief.  Has gabapentin which does not help.  No related back or buttock pain  HPI  Review of Systems   Objective: Vital Signs: LMP 06/06/1996 Comment: GYN  Physical Exam Constitutional:      Appearance: She is well-developed.  Eyes:     Pupils: Pupils are equal, round, and reactive  to light.  Pulmonary:     Effort: Pulmonary effort is normal.  Skin:    General: Skin is warm and dry.  Neurological:     Mental Status: She is alert and oriented to person, place, and time.  Psychiatric:        Behavior: Behavior normal.     Ortho Exam awake alert and oriented x3.  Comfortable sitting and in no acute distress.  Ambulates without use of an ambulatory aid.  Painless range of motion both hips.  She is definitely tender over the greater trochanter enteric region of her right hip.  Large thighs with abundant adipose.  No specific right knee pain.  The knee was not hot red warm or effused.  No instability.  No popliteal pain.  No significant medial or lateral joint pain.  Full quick extension flex well over 100 degrees no thigh pain.   Straight leg raise negative.  No percussible tenderness of lumbar spine or sacrum  Specialty Comments:  No specialty comments available.  Imaging: XR KNEE 3 VIEW RIGHT  Result Date: 01/28/2022 Films of the right knee were obtained in 3 projections standing.  There are some very minimal degenerative changes in all 3 compartments but normal alignment and normal joint space with both medially and laterally.  Patella tracks in the midline.  No significant sclerosis with small peripheral osteophytes medially and laterally  XR HIP UNILAT W OR W/O PELVIS 2-3 VIEWS RIGHT  Result Date: 01/28/2022 AP pelvis and lateral right hip were obtained demonstrating some degenerative change at the far superior lateral joint line.  There is some sclerosis subchondral cyst formation and mild narrowing.  Some calcification along the dual acetabulum and labrum.  Mild subchondral cyst in the femoral head.  Films are consistent with mild to moderate osteoarthritis.  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   Unilateral primary osteoarthritis, left knee 07/16/2020   Presence of left artificial hip joint 07/09/2017   Multinodular goiter 04/17/2015   History of stomach ulcers 03/22/2015   Chronic pain of left heel 04/23/2014   Chronic pain syndrome 03/19/2014   Asthma 01/04/2013   BMI 29.0-29.9,adult 10/12/2011   Paroxysmal nerve pain 01/22/2011   Hyperlipidemia 02/16/2007   GERD 02/16/2007   Past Medical History:  Diagnosis Date   Allergy    Arthritis    Asthma    Cataract    bilateral eyes   Endometriosis    GERD (gastroesophageal reflux disease)    Headache    migraines    History of tetanus, diphtheria, and acellular pertussis booster vaccination (Tdap)    07/30/2010   Hyperlipidemia    Kidney stones    Pancreatitis     Family History  Problem Relation Age of Onset   Heart disease Daughter    Hypertension Father    Hypertension Sister    Hypertension Brother    Hypertension Maternal Grandfather    Heart disease Maternal Grandfather    Hypertension Paternal Grandfather    Hypertension Brother    Hypertension Sister    Thyroid disease Neg Hx    Colon polyps Neg Hx    Colon cancer Neg Hx  Esophageal cancer Neg Hx    Rectal cancer Neg Hx    Stomach cancer Neg Hx     Past Surgical History:  Procedure Laterality Date   ABDOMINAL HYSTERECTOMY     arthroscopic knee surgery      carpel tunnel     CHOLECYSTECTOMY     ERCP  01/08/2012   Procedure: ENDOSCOPIC RETROGRADE CHOLANGIOPANCREATOGRAPHY (ERCP);  Surgeon: Beryle Beams, MD;  Location: [facility] ENDOSCOPY;  Service: Endoscopy;  Laterality: N/A;   HIP ARTHROPLASTY     left   HIP SURGERY     oophrectomy  2011   secondary to cysts   THYROIDECTOMY N/A 11/21/2020   Procedure: TOTAL THYROIDECTOMY;  Surgeon: Armandina Gemma, MD;  Location: [facility] ORS;  Service: General;  Laterality: N/A;   TUBAL LIGATION     UPPER GASTROINTESTINAL ENDOSCOPY  2016   Social History   Occupational History   Occupation: Nursing Aid    Employer: [employer]  Tobacco Use   Smoking status: Never   Smokeless tobacco: Never  Vaping Use   Vaping Use: Never used  Substance and Sexual Activity   Alcohol use: No   Drug use: No   Sexual activity: Yes    Birth control/protection: None     Garald Balding, MD   Note - This record has been created using Editor, commissioning.  Chart creation errors have been sought, but may not always  have been located. Such creation errors do not reflect on  the standard of medical care.

## 2022-02-02 ENCOUNTER — Ambulatory Visit
Admission: RE | Admit: 2022-02-02 | Discharge: 2022-02-02 | Disposition: A | Discharge status: Home / Self Care | Payer: 59 | Source: Ambulatory Visit | Attending: Internal Medicine | Admitting: Internal Medicine

## 2022-02-02 DIAGNOSIS — Z1231 Encounter for screening mammogram for malignant neoplasm of breast: Secondary | ICD-10-CM

## 2022-02-03 ENCOUNTER — Other Ambulatory Visit: Payer: Self-pay | Admitting: Neurology

## 2022-02-03 ENCOUNTER — Encounter: Payer: Self-pay | Admitting: Neurology

## 2022-02-03 ENCOUNTER — Other Ambulatory Visit: Payer: Self-pay

## 2022-02-03 MED ORDER — UBRELVY 100 MG PO TABS: 1.0000 | ORAL_TABLET | ORAL | 11 refills | Status: DC | PRN

## 2022-02-03 NOTE — Progress Notes (Signed)
error 

## 2022-02-05 ENCOUNTER — Telehealth: Payer: Self-pay

## 2022-02-05 ENCOUNTER — Other Ambulatory Visit (HOSPITAL_COMMUNITY): Payer: Self-pay

## 2022-02-05 NOTE — Telephone Encounter (Signed)
Patient Advocate Encounter   Received notification from [employer] that prior authorization is required for Emgality '120MG'$ /ML auto-injectors  Submitted: 02-05-2022 Key [id number]  Status is pending

## 2022-02-06 ENCOUNTER — Other Ambulatory Visit: Payer: Self-pay | Admitting: Family Medicine

## 2022-02-06 ENCOUNTER — Other Ambulatory Visit (HOSPITAL_COMMUNITY): Payer: Self-pay

## 2022-02-06 ENCOUNTER — Telehealth: Payer: Self-pay

## 2022-02-06 DIAGNOSIS — R519 Headache, unspecified: Secondary | ICD-10-CM

## 2022-02-06 NOTE — Telephone Encounter (Signed)
Patient Advocate Encounter   Received notification from [employer] that prior authorization is required for Ubrelvy '100MG'$  tablets  Submitted: 02-06-2022 Key [id number]  Status is pending

## 2022-02-06 NOTE — Telephone Encounter (Signed)
Patient Advocate Encounter  Prior Authorization for Kendra Gonzalez '100MG'$  tablets has been approved.     Effective: 02-06-2022 to 02-07-2023

## 2022-02-06 NOTE — Telephone Encounter (Signed)
Patient Advocate Encounter  Prior Authorization for Emgality '120MG'$ /ML auto-injectors has been approved.    Effective: 01-25-2022 to 02-06-2023

## 2022-02-10 ENCOUNTER — Encounter: Payer: Self-pay | Admitting: Internal Medicine

## 2022-02-10 ENCOUNTER — Ambulatory Visit (INDEPENDENT_AMBULATORY_CARE_PROVIDER_SITE_OTHER): Payer: 59 | Admitting: Internal Medicine

## 2022-02-10 ENCOUNTER — Ambulatory Visit (INDEPENDENT_AMBULATORY_CARE_PROVIDER_SITE_OTHER): Payer: 59

## 2022-02-10 VITALS — BP 120/84 | HR 76 | Temp 98.0°F | Wt 191.5 lb

## 2022-02-10 DIAGNOSIS — K5900 Constipation, unspecified: Secondary | ICD-10-CM

## 2022-02-10 MED ORDER — MAGNESIUM CITRATE PO SOLN: 1.0000 | Freq: Once | ORAL | 1 refills | Status: AC

## 2022-02-10 NOTE — Progress Notes (Signed)
Established Patient Office Visit     CC/Reason for Visit: Continued constipation  HPI: Kendra Gonzalez is a 51 y.o. female who is coming in today for the above mentioned reasons.  She was seen on 9/26 for constipation and was advised to take Colace and MiraLAX twice daily which she has been adherent to.  Unfortunately she continues to have severe constipation and is now having nausea and stomach discomfort and fatigue.  She is only having a bowel movement about every 4 to 5 days.   Past Medical/Surgical History: Past Medical History:  Diagnosis Date   Allergy    Arthritis    Asthma    Cataract    bilateral eyes   Endometriosis    GERD (gastroesophageal reflux disease)    Headache    migraines   History of tetanus, diphtheria, and acellular pertussis booster vaccination (Tdap)    07/30/2010   Hyperlipidemia    Kidney stones    Pancreatitis     Past Surgical History:  Procedure Laterality Date   ABDOMINAL HYSTERECTOMY     arthroscopic knee surgery      carpel tunnel     CHOLECYSTECTOMY     ERCP  01/08/2012   Procedure: ENDOSCOPIC RETROGRADE CHOLANGIOPANCREATOGRAPHY (ERCP);  Surgeon: Beryle Beams, MD;  Location: [facility] ENDOSCOPY;  Service: Endoscopy;  Laterality: N/A;   HIP ARTHROPLASTY     left   HIP SURGERY     oophrectomy  2011   secondary to cysts   THYROIDECTOMY N/A 11/21/2020   Procedure: TOTAL THYROIDECTOMY;  Surgeon: Armandina Gemma, MD;  Location: [facility] ORS;  Service: General;  Laterality: N/A;   TUBAL LIGATION     UPPER GASTROINTESTINAL ENDOSCOPY  2016    Social History:  reports that she has never smoked. She has never used smokeless tobacco. She reports that she does not drink alcohol and does not use drugs.  Allergies: Allergies  Allergen Reactions   Hydromorphone Itching    Family History:  Family History  Problem Relation Age of Onset   Heart disease Daughter    Hypertension Father    Hypertension Sister    Hypertension Brother     Hypertension Maternal Grandfather    Heart disease Maternal Grandfather    Hypertension Paternal Grandfather    Hypertension Brother    Hypertension Sister    Thyroid disease Neg Hx    Colon polyps Neg Hx    Colon cancer Neg Hx    Esophageal cancer Neg Hx    Rectal cancer Neg Hx    Stomach cancer Neg Hx      Current Outpatient Medications:    acetaminophen (TYLENOL) 500 MG tablet, Take 1,000 mg by mouth every 8 (eight) hours as needed for moderate pain., Disp: , Rfl:    albuterol (PROVENTIL) (2.5 MG/3ML) 0.083% nebulizer solution, Take 3 mLs (2.5 mg total) by nebulization every 6 (six) hours as needed for wheezing or shortness of breath., Disp: 150 mL, Rfl: 1   albuterol (VENTOLIN HFA) 108 (90 Base) MCG/ACT inhaler, Inhale 2 puffs into the lungs every 6 (six) hours as needed for wheezing or shortness of breath., Disp: 18 g, Rfl: 2   baclofen (LIORESAL) 10 MG tablet, Take 1 tablet (10 mg total) by mouth 2 (two) times daily as needed (migraines)., Disp: 90 each, Rfl: 1   fluticasone-salmeterol (ADVAIR HFA) 115-21 MCG/ACT inhaler, Inhale 2 puffs into the lungs 2 (two) times daily., Disp: 1 each, Rfl: 2   gabapentin (NEURONTIN) 300 MG  capsule, Take 1 capsule (300 mg total) by mouth 3 (three) times daily., Disp: 90 capsule, Rfl: 0   Galcanezumab-gnlm (EMGALITY) 120 MG/ML SOAJ, Inject 120 mg into the skin every 28 (twenty-eight) days., Disp: 1.12 mL, Rfl: 11   levothyroxine (SYNTHROID) 125 MCG tablet, Take 1 tablet (125 mcg total) by mouth daily., Disp: 90 tablet, Rfl: 1   magnesium citrate SOLN, Take 296 mLs (1 Bottle total) by mouth once for 1 dose., Disp: 195 mL, Rfl: 1   magnesium gluconate (MAGONATE) 500 MG tablet, Take 500 mg by mouth at bedtime., Disp: , Rfl:    Ubrogepant (UBRELVY) 100 MG TABS, Take 1 tablet by mouth as needed. May repeat after 2 hours.  Maximum 2 tablets in 24 hours., Disp: 16 tablet, Rfl: 11   valsartan-hydrochlorothiazide (DIOVAN-HCT) 160-25 MG tablet, Take 1 tablet by  mouth daily., Disp: 90 tablet, Rfl: 1   promethazine (PHENERGAN) 25 MG tablet, Take 1 tablet (25 mg total) by mouth once for 1 dose., Disp: 2 tablet, Rfl: 0  Review of Systems:  Constitutional: Denies fever, chills, diaphoresis, appetite change. HEENT: Denies photophobia, eye pain, redness, hearing loss, ear pain, congestion, sore throat, rhinorrhea, sneezing, mouth sores, trouble swallowing, neck pain, neck stiffness and tinnitus.   Respiratory: Denies SOB, DOE, cough, chest tightness,  and wheezing.   Cardiovascular: Denies chest pain, palpitations and leg swelling.  Gastrointestinal: Denies vomiting,  diarrhea,  blood in stool.  Genitourinary: Denies dysuria, urgency, frequency, hematuria, flank pain and difficulty urinating.  Endocrine: Denies: hot or cold intolerance, sweats, changes in hair or nails, polyuria, polydipsia. Musculoskeletal: Denies myalgias, back pain, joint swelling, arthralgias and gait problem.  Skin: Denies pallor, rash and wound.  Neurological: Denies dizziness, seizures, syncope, weakness, light-headedness, numbness and headaches.  Hematological: Denies adenopathy. Easy bruising, personal or family bleeding history  Psychiatric/Behavioral: Denies suicidal ideation, mood changes, confusion, nervousness, sleep disturbance and agitation    Physical Exam: Vitals:   02/10/22 0735  BP: 120/84  Pulse: 76  Temp: 98 F (36.7 C)  TempSrc: Oral  SpO2: 98%  Weight: 191 lb 8 oz (86.9 kg)    Body mass index is 32.87 kg/m.   Constitutional: NAD, calm, comfortable Eyes: PERRL, lids and conjunctivae normal ENMT: Mucous membranes are moist.   Abdomen: Soft, no tenderness, no masses palpated. No hepatosplenomegaly. Bowel sounds positive.  Psychiatric: Normal judgment and insight. Alert and oriented x 3. Normal mood.    Impression and Plan:  Constipation, unspecified constipation type - Plan: DG Abd 2 Views, magnesium citrate SOLN  -Check abdominal film, send mag  citrate, continue Colace and MiraLAX.  Increase water intake.  Time spent:22 minutes reviewing chart, interviewing and examining patient and formulating plan of care.      Lelon Frohlich, MD [facility]

## 2022-02-11 ENCOUNTER — Other Ambulatory Visit: Payer: Self-pay | Admitting: Internal Medicine

## 2022-02-11 MED ORDER — MAGNESIUM GLUCONATE 500 MG PO TABS: 500.0000 mg | ORAL_TABLET | Freq: Every day | ORAL | 0 refills | Status: DC

## 2022-02-12 ENCOUNTER — Telehealth: Payer: Self-pay | Admitting: Internal Medicine

## 2022-02-12 NOTE — Telephone Encounter (Signed)
Pharmacy told patient they were waiting for provider approval for Magnesium Gluconate 550 MG TABS

## 2022-03-15 ENCOUNTER — Ambulatory Visit
Admission: EM | Admit: 2022-03-15 | Discharge: 2022-03-15 | Disposition: A | Discharge status: Home / Self Care | Payer: 59

## 2022-03-15 ENCOUNTER — Emergency Department (HOSPITAL_COMMUNITY)
Admission: EM | Admit: 2022-03-15 | Discharge: 2022-03-15 | Disposition: A | Discharge status: Home / Self Care | Payer: 59 | Attending: Emergency Medicine | Admitting: Emergency Medicine

## 2022-03-15 ENCOUNTER — Other Ambulatory Visit: Payer: Self-pay

## 2022-03-15 ENCOUNTER — Encounter (HOSPITAL_COMMUNITY): Payer: Self-pay

## 2022-03-15 DIAGNOSIS — Z79899 Other long term (current) drug therapy: Secondary | ICD-10-CM | POA: Insufficient documentation

## 2022-03-15 DIAGNOSIS — I10 Essential (primary) hypertension: Secondary | ICD-10-CM | POA: Diagnosis not present

## 2022-03-15 DIAGNOSIS — Z7951 Long term (current) use of inhaled steroids: Secondary | ICD-10-CM | POA: Insufficient documentation

## 2022-03-15 DIAGNOSIS — R519 Headache, unspecified: Secondary | ICD-10-CM

## 2022-03-15 DIAGNOSIS — J45909 Unspecified asthma, uncomplicated: Secondary | ICD-10-CM | POA: Insufficient documentation

## 2022-03-15 DIAGNOSIS — H53149 Visual discomfort, unspecified: Secondary | ICD-10-CM | POA: Insufficient documentation

## 2022-03-15 LAB — CBC WITH DIFFERENTIAL/PLATELET
Abs Immature Granulocytes: 0.01 10*3/uL (ref 0.00–0.07)
Basophils Absolute: 0 10*3/uL (ref 0.0–0.1)
Basophils Relative: 1 %
Eosinophils Absolute: 0.1 10*3/uL (ref 0.0–0.5)
Eosinophils Relative: 1 %
HCT: 43.6 % (ref 36.0–46.0)
Hemoglobin: 14.3 g/dL (ref 12.0–15.0)
Immature Granulocytes: 0 %
Lymphocytes Relative: 34 %
Lymphs Abs: 2.2 10*3/uL (ref 0.7–4.0)
MCH: 28.5 pg (ref 26.0–34.0)
MCHC: 32.8 g/dL (ref 30.0–36.0)
MCV: 87 fL (ref 80.0–100.0)
Monocytes Absolute: 0.4 10*3/uL (ref 0.1–1.0)
Monocytes Relative: 7 %
Neutro Abs: 3.7 10*3/uL (ref 1.7–7.7)
Neutrophils Relative %: 57 %
Platelets: 307 10*3/uL (ref 150–400)
RBC: 5.01 MIL/uL (ref 3.87–5.11)
RDW: 13.3 % (ref 11.5–15.5)
WBC: 6.4 10*3/uL (ref 4.0–10.5)
nRBC: 0 % (ref 0.0–0.2)

## 2022-03-15 LAB — COMPREHENSIVE METABOLIC PANEL
ALT: 20 U/L (ref 0–44)
AST: 25 U/L (ref 15–41)
Albumin: 4.2 g/dL (ref 3.5–5.0)
Alkaline Phosphatase: 82 U/L (ref 38–126)
Anion gap: 10 (ref 5–15)
BUN: 9 mg/dL (ref 6–20)
CO2: 23 mmol/L (ref 22–32)
Calcium: 10.1 mg/dL (ref 8.9–10.3)
Chloride: 104 mmol/L (ref 98–111)
Creatinine, Ser: 0.86 mg/dL (ref 0.44–1.00)
GFR, Estimated: >60 mL/min (ref >60)
Glucose, Bld: 83 mg/dL (ref 70–99)
Potassium: 4.1 mmol/L (ref 3.5–5.1)
Sodium: 137 mmol/L (ref 135–145)
Total Bilirubin: 1 mg/dL (ref 0.3–1.2)
Total Protein: 8 g/dL (ref 6.5–8.1)

## 2022-03-15 LAB — SEDIMENTATION RATE: Sed Rate: 14 mm/hr (ref 0–22)

## 2022-03-15 LAB — TSH: TSH: <0.01 u[IU]/mL — ABNORMAL LOW (ref 0.350–4.500)

## 2022-03-15 LAB — T4, FREE: Free T4: 1.81 ng/dL — ABNORMAL HIGH (ref 0.61–1.12)

## 2022-03-15 MED ORDER — KETOROLAC TROMETHAMINE 30 MG/ML IJ SOLN
15.0000 mg | Freq: Once | INTRAMUSCULAR | Status: AC
  Administered 2022-03-15: 15 mg via INTRAVENOUS
  Filled 2022-03-15: qty 1

## 2022-03-15 MED ORDER — DEXAMETHASONE SODIUM PHOSPHATE 10 MG/ML IJ SOLN
10.0000 mg | Freq: Once | INTRAMUSCULAR | Status: AC
  Administered 2022-03-15: 10 mg via INTRAVENOUS
  Filled 2022-03-15: qty 1

## 2022-03-15 MED ORDER — SODIUM CHLORIDE 0.9 % IV BOLUS
1000.0000 mL | Freq: Once | INTRAVENOUS | Status: AC
  Administered 2022-03-15: 1000 mL via INTRAVENOUS

## 2022-03-15 MED ORDER — PROCHLORPERAZINE EDISYLATE 10 MG/2ML IJ SOLN
10.0000 mg | Freq: Once | INTRAMUSCULAR | Status: AC
  Administered 2022-03-15: 10 mg via INTRAVENOUS
  Filled 2022-03-15: qty 2

## 2022-03-15 NOTE — Discharge Instructions (Signed)
Go to the emergency department as soon as you leave urgent care for further evaluation and management. 

## 2022-03-15 NOTE — ED Provider Notes (Signed)
[facility] DEPT Provider Note   CSN: [id number] Arrival date & time: 03/15/22  1328     History  Chief Complaint  Patient presents with   Migraine    Kendra Gonzalez is a 51 y.o. female.  HPI Patient presents with concern of headache.  She has a history of migraines, takes multiple medication including monthly injectable and a daily.  Today, in spite of taking her medication her headache has been persistent, right-sided with associated fullness feeling.  There is photophobia, but no vision loss, no vomiting, no fever, no neck pain.  She went to urgent care and was sent here due to the severity of her pain.    Home Medications Prior to Admission medications   Medication Sig Start Date End Date Taking? Authorizing Provider  acetaminophen (TYLENOL) 500 MG tablet Take 1,000 mg by mouth every 8 (eight) hours as needed for moderate pain.    [provider]  albuterol (PROVENTIL) (2.5 MG/3ML) 0.083% nebulizer solution Take 3 mLs (2.5 mg total) by nebulization every 6 (six) hours as needed for wheezing or shortness of breath. 03/19/21   Isaac Bliss, Rayford Halsted, MD  albuterol (VENTOLIN HFA) 108 (90 Base) MCG/ACT inhaler Inhale 2 puffs into the lungs every 6 (six) hours as needed for wheezing or shortness of breath. 03/19/21   Isaac Bliss, Rayford Halsted, MD  baclofen (LIORESAL) 10 MG tablet Take 1 tablet (10 mg total) by mouth 2 (two) times daily as needed (migraines). 01/13/22   Leamon Arnt, MD  fluticasone-salmeterol (ADVAIR HFA) [phone] MCG/ACT inhaler Inhale 2 puffs into the lungs 2 (two) times daily. 03/19/21   Isaac Bliss, Rayford Halsted, MD  gabapentin (NEURONTIN) 300 MG capsule Take 1 capsule (300 mg total) by mouth 3 (three) times daily. 01/13/22 02/12/22  Leamon Arnt, MD  Galcanezumab-gnlm [facility]) 120 MG/ML SOAJ Inject 120 mg into the skin every 28 (twenty-eight) days. 07/07/21   Pieter Partridge, DO  levothyroxine (SYNTHROID) 125  MCG tablet Take 1 tablet (125 mcg total) by mouth daily. 01/28/22   Isaac Bliss, Rayford Halsted, MD  Magnesium Gluconate 550 MG TABS TAKE 1 TABLET BY MOUTH AT BEDTIME. 02/11/22   Isaac Bliss, Rayford Halsted, MD  promethazine (PHENERGAN) 25 MG tablet Take 1 tablet (25 mg total) by mouth once for 1 dose. 01/30/21 01/30/21  Erline Hau, MD  Ubrogepant (UBRELVY) 100 MG TABS Take 1 tablet by mouth as needed. May repeat after 2 hours.  Maximum 2 tablets in 24 hours. 02/03/22   Tomi Likens, Adam R, DO  valsartan-hydrochlorothiazide (DIOVAN-HCT) 160-25 MG tablet Take 1 tablet by mouth daily. 06/24/21   Isaac Bliss, Rayford Halsted, MD  methimazole (TAPAZOLE) 10 MG tablet Take 1 tablet (10 mg total) by mouth 2 (two) times daily. Patient not taking: Reported on 10/29/2020 08/06/20 11/21/20  Renato Shin, MD      Allergies    Hydromorphone    Review of Systems   Review of Systems  All other systems reviewed and are negative.   Physical Exam Updated Vital Signs BP 114/72   Pulse 79   Temp 98.2 F (36.8 C) (Oral)   Resp 18   Ht '5\' 4"'$  (1.626 m)   Wt 86.6 kg   LMP 06/06/1996 Comment: GYN  SpO2 100%   BMI 32.79 kg/m  Physical Exam Vitals and nursing note reviewed.  Constitutional:      General: She is not in acute distress.    Appearance: She is well-developed.  HENT:  Head: Normocephalic and atraumatic.  Eyes:     Conjunctiva/sclera: Conjunctivae normal.  Neck:     Comments: No rigidity, no hesitancy to move the neck Cardiovascular:     Rate and Rhythm: Normal rate and regular rhythm.  Pulmonary:     Effort: Pulmonary effort is normal. No respiratory distress.     Breath sounds: Normal breath sounds. No stridor.  Abdominal:     General: There is no distension.  Skin:    General: Skin is warm and dry.  Neurological:     Mental Status: She is alert and oriented to person, place, and time.     Cranial Nerves: No cranial nerve deficit.     Motor: No weakness, tremor or atrophy.   Psychiatric:        Mood and Affect: Mood normal.     ED Results / Procedures / Treatments   Labs (all labs ordered are listed, but only abnormal results are displayed) Labs Reviewed  COMPREHENSIVE METABOLIC PANEL  CBC WITH DIFFERENTIAL/PLATELET  SEDIMENTATION RATE  TSH  T4, FREE    EKG None  Radiology No results found.  Procedures Procedures    Medications Ordered in ED Medications  sodium chloride 0.9 % bolus 1,000 mL (0 mLs Intravenous Stopped 03/15/22 1709)  ketorolac (TORADOL) 30 MG/ML injection 15 mg (15 mg Intravenous Given 03/15/22 1409)  prochlorperazine (COMPAZINE) injection 10 mg (10 mg Intravenous Given 03/15/22 1409)  dexamethasone (DECADRON) injection 10 mg (10 mg Intravenous Given 03/15/22 1410)    ED Course/ Medical Decision Making/ A&P                           Medical Decision Making Patient with history of migraines, asthma, hypertension who presents with headache, photophobia in spite of home meds.  [place] includes atypical migraine, intractable pain, less likely intracranial mass less likely giant cell arteritis, though this is a consideration  Amount and/or Complexity of Data Reviewed Independent Historian:     Details: Urgent care notes reviewed, concern for intractable headache Labs: ordered. Decision-making details documented in ED Course. Radiology:  Decision-making details documented in ED Course. ECG/medicine tests:  Decision-making details documented in ED Course.  Risk Prescription drug management. Decision regarding hospitalization.   5:43 PM Patient awake, alert, sitting on the edge of the bed.  1 prior interval evaluation was similarly reassuring. We discussed all findings including reassuring labs.  Sed rate normal, no evidence for temporal arteritis.  Physical exam reassuring, no neurodeficits low suspicion for intracranial pathology.  Given resolution here patient appropriate for discharge with outpatient follow-up,  though hospitalization was a consideration.        Final Clinical Impression(s) / ED Diagnoses Final diagnoses:  Bad headache    Rx / DC Orders ED Discharge Orders     None         Carmin Muskrat, MD 03/15/22 1743

## 2022-03-15 NOTE — ED Notes (Signed)
Patient is being discharged from the Urgent Care and sent to the Emergency Department via POV . Per Oswaldo Conroy NP, patient is in need of higher level of care due to migraines. Patient is aware and verbalizes understanding of plan of care.  Vitals:   03/15/22 1209  BP: (!) 141/93  Pulse: 72  Resp: 16  Temp: 98.3 F (36.8 C)  SpO2: 98%

## 2022-03-15 NOTE — ED Notes (Signed)
Lights dimmed for pt due to migraine

## 2022-03-15 NOTE — Discharge Instructions (Signed)
As discussed, your evaluation today has been largely reassuring.  But, it is important that you monitor your condition carefully, and do not hesitate to return to the ED if you develop new, or concerning changes in your condition. ? ?Otherwise, please follow-up with your physician for appropriate ongoing care. ? ?

## 2022-03-15 NOTE — ED Triage Notes (Signed)
Pt presents to uc with co of migraine since Friday. Pt also reports right otalgia. Pt st she has been taking her migraine medication with minimal improvement. Pt reports pain is 9/10 all the time.

## 2022-03-15 NOTE — ED Triage Notes (Signed)
Pt was seen at [facility] today for migraine sent here for CT scan.

## 2022-03-15 NOTE — ED Provider Notes (Signed)
[name]    CSN: [id number] Arrival date & time: 03/15/22  1122      History   Chief Complaint Chief Complaint  Patient presents with   Migraine    HPI Kendra Gonzalez is a 51 y.o. female.   Patient presents with migraine headache that has been present for about 3 days.  Patient reports that headache is present in the forehead and bilateral sides of the head.  She does report that she has history of migraines but states that this "feels different".  She states that she is also having some right ear pain and pain throughout her right temple which is not typical for her migraines.  She reports nausea with no vomiting.  Denies dizziness or blurred vision.  Patient denies any recent falls or head trauma.  Patient states that she takes [place] and [employer] for migraines.  She last took [place] this morning with no improvement.  She reports that she has been told to not take any additional medications for migraines.  Pain is 9/10 on pain scale.   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   Unilateral primary osteoarthritis, left knee 07/16/2020   Presence of left artificial hip joint 07/09/2017   Multinodular goiter 04/17/2015   History of stomach ulcers 03/22/2015   Chronic pain of left heel 04/23/2014   Chronic pain syndrome 03/19/2014   Asthma 01/04/2013   BMI 29.0-29.9,adult  10/12/2011   Paroxysmal nerve pain 01/22/2011   Hyperlipidemia 02/16/2007   GERD 02/16/2007    Past Surgical History:  Procedure Laterality Date   ABDOMINAL HYSTERECTOMY     arthroscopic knee surgery      carpel tunnel     CHOLECYSTECTOMY     ERCP  01/08/2012   Procedure: ENDOSCOPIC RETROGRADE CHOLANGIOPANCREATOGRAPHY (ERCP);  Surgeon: Beryle Beams, MD;  Location: [facility] ENDOSCOPY;  Service: Endoscopy;  Laterality: N/A;   HIP ARTHROPLASTY     left   HIP SURGERY     oophrectomy  2011   secondary to cysts   THYROIDECTOMY N/A 11/21/2020   Procedure: TOTAL THYROIDECTOMY;  Surgeon: Armandina Gemma, MD;  Location: [facility] ORS;  Service: General;  Laterality: N/A;   TUBAL LIGATION     UPPER GASTROINTESTINAL ENDOSCOPY  2016    OB History   No obstetric history on file.      Home Medications    Prior to Admission medications   Medication Sig Start Date End Date Taking? Authorizing Provider  acetaminophen (TYLENOL) 500 MG tablet Take 1,000 mg by mouth every 8 (eight) hours as needed for moderate pain.    [provider]  albuterol (PROVENTIL) (2.5 MG/3ML) 0.083% nebulizer solution Take 3 mLs (2.5 mg total) by nebulization every 6 (six) hours as needed for wheezing or shortness of breath. 03/19/21   Isaac Bliss, Rayford Halsted,  MD  albuterol (VENTOLIN HFA) 108 (90 Base) MCG/ACT inhaler Inhale 2 puffs into the lungs every 6 (six) hours as needed for wheezing or shortness of breath. 03/19/21   Isaac Bliss, Rayford Halsted, MD  baclofen (LIORESAL) 10 MG tablet Take 1 tablet (10 mg total) by mouth 2 (two) times daily as needed (migraines). 01/13/22   Leamon Arnt, MD  fluticasone-salmeterol (ADVAIR HFA) [phone] MCG/ACT inhaler Inhale 2 puffs into the lungs 2 (two) times daily. 03/19/21   Isaac Bliss, Rayford Halsted, MD  gabapentin (NEURONTIN) 300 MG capsule Take 1 capsule (300 mg total) by mouth 3 (three) times daily. 01/13/22 02/12/22  Leamon Arnt, MD  Galcanezumab-gnlm [facility]) 120 MG/ML  SOAJ Inject 120 mg into the skin every 28 (twenty-eight) days. 07/07/21   Pieter Partridge, DO  levothyroxine (SYNTHROID) 125 MCG tablet Take 1 tablet (125 mcg total) by mouth daily. 01/28/22   Isaac Bliss, Rayford Halsted, MD  Magnesium Gluconate 550 MG TABS TAKE 1 TABLET BY MOUTH AT BEDTIME. 02/11/22   Isaac Bliss, Rayford Halsted, MD  promethazine (PHENERGAN) 25 MG tablet Take 1 tablet (25 mg total) by mouth once for 1 dose. 01/30/21 01/30/21  Erline Hau, MD  Ubrogepant (UBRELVY) 100 MG TABS Take 1 tablet by mouth as needed. May repeat after 2 hours.  Maximum 2 tablets in 24 hours. 02/03/22   Tomi Likens, Adam R, DO  valsartan-hydrochlorothiazide (DIOVAN-HCT) 160-25 MG tablet Take 1 tablet by mouth daily. 06/24/21   Isaac Bliss, Rayford Halsted, MD  methimazole (TAPAZOLE) 10 MG tablet Take 1 tablet (10 mg total) by mouth 2 (two) times daily. Patient not taking: Reported on 10/29/2020 08/06/20 11/21/20  Renato Shin, MD    Family History Family History  Problem Relation Age of Onset   Heart disease Daughter    Hypertension Father    Hypertension Sister    Hypertension Brother    Hypertension Maternal Grandfather    Heart disease Maternal Grandfather    Hypertension Paternal Grandfather    Hypertension Brother    Hypertension Sister    Thyroid disease Neg Hx    Colon polyps Neg Hx    Colon cancer Neg Hx    Esophageal cancer Neg Hx    Rectal cancer Neg Hx    Stomach cancer Neg Hx     Social History Social History   Tobacco Use   Smoking status: Never   Smokeless tobacco: Never  Vaping Use   Vaping Use: Never used  Substance Use Topics   Alcohol use: No   Drug use: No     Allergies   Hydromorphone   Review of Systems Review of Systems Per HPI  Physical Exam Triage Vital Signs ED Triage Vitals  Enc Vitals Group     BP 03/15/22 1209 (!) 141/93     Pulse Rate 03/15/22 1209 72     Resp 03/15/22 1209 16     Temp 03/15/22 1209 98.3 F (36.8 C)     Temp Source 03/15/22  1209 Oral     SpO2 03/15/22 1209 98 %     Weight --      Height --      Head Circumference --      Peak Flow --      Pain Score 03/15/22 1208 9     Pain Loc --      Pain Edu? --      Excl. in [place]? --    No data found.  Updated Vital Signs BP Marland Kitchen)  141/93   Pulse 72   Temp 98.3 F (36.8 C) (Oral)   Resp 16   LMP 06/06/1996 Comment: GYN  SpO2 98%   Visual Acuity Right Eye Distance:   Left Eye Distance:   Bilateral Distance:    Right Eye Near:   Left Eye Near:    Bilateral Near:     Physical Exam Constitutional:      General: She is not in acute distress.    Appearance: Normal appearance. She is not toxic-appearing or diaphoretic.     Comments: Patient sitting with her eyes closed due to light sensitivity and holding her head due to pain.   HENT:     Head: Normocephalic and atraumatic.      Comments: Tenderness to palpation across the right temple.  No obvious swelling or discoloration noted.    Right Ear: Tympanic membrane, ear canal and external ear normal.     Left Ear: Tympanic membrane, ear canal and external ear normal.  Eyes:     Extraocular Movements: Extraocular movements intact.     Conjunctiva/sclera: Conjunctivae normal.     Pupils: Pupils are equal, round, and reactive to light.  Pulmonary:     Effort: Pulmonary effort is normal.  Neurological:     General: No focal deficit present.     Mental Status: She is alert and oriented to person, place, and time. Mental status is at baseline.     Cranial Nerves: Cranial nerves 2-12 are intact.     Sensory: Sensation is intact.     Motor: Motor function is intact.     Coordination: Coordination is intact.     Gait: Gait is intact.  Psychiatric:        Mood and Affect: Mood normal.        Behavior: Behavior normal.        Thought Content: Thought content normal.        Judgment: Judgment normal.      UC Treatments / Results  Labs (all labs ordered are listed, but only abnormal results are  displayed) Labs Reviewed - No data to display  EKG   Radiology No results found.  Procedures Procedures (including critical care time)  Medications Ordered in UC Medications - No data to display  Initial Impression / Assessment and Plan / UC Course  I have reviewed the triage vital signs and the nursing notes.  Pertinent labs & imaging results that were available during my care of the patient were reviewed by me and considered in my medical decision making (see chart for details).     Patient has history of migraines but states that this "feels different".  She has right-sided temple pain which is not typical for her migraines.  It is very concerning that she may need imaging of her head which cannot be provided here at urgent care.  Patient was advised to go to the ER for further evaluation and management and was agreeable with plan.  Vital signs and patient stable at discharge.  Agree with patient self transport to the hospital. Final Clinical Impressions(s) / UC Diagnoses   Final diagnoses:  Severe headache     Discharge Instructions      Go to the emergency department as soon as you leave urgent care for further evaluation and management.    ED Prescriptions   None    PDMP not reviewed this encounter.   Teodora Medici, [place] 03/15/22 [phone]

## 2022-03-15 NOTE — ED Notes (Signed)
Called patient and told her we have a room ready for her. Pt st she would be right inside.

## 2022-03-16 ENCOUNTER — Other Ambulatory Visit: Payer: Self-pay | Admitting: Student in an Organized Health Care Education/Training Program

## 2022-03-16 ENCOUNTER — Ambulatory Visit: Payer: Self-pay

## 2022-03-16 DIAGNOSIS — M5416 Radiculopathy, lumbar region: Secondary | ICD-10-CM

## 2022-03-23 ENCOUNTER — Other Ambulatory Visit: Payer: Self-pay | Admitting: Pulmonary Disease

## 2022-03-23 ENCOUNTER — Telehealth: Payer: Self-pay | Admitting: Internal Medicine

## 2022-03-23 NOTE — Telephone Encounter (Signed)
Placed in Dr Hernandez's folder 

## 2022-03-23 NOTE — Telephone Encounter (Signed)
Patient dropped of form needing to be filled out by PCP.  Billing form was completed and attached, form was given to [place] directly for completion. Patient also canceled appointment for tomorrow (11/28) regarding labs, as she has already had labs drawn at hospital.      FYI/ please advise

## 2022-03-24 ENCOUNTER — Ambulatory Visit: Payer: 59 | Admitting: Internal Medicine

## 2022-04-11 ENCOUNTER — Ambulatory Visit
Admission: RE | Admit: 2022-04-11 | Discharge: 2022-04-11 | Disposition: A | Discharge status: Home / Self Care | Payer: 59 | Source: Ambulatory Visit | Attending: Student in an Organized Health Care Education/Training Program | Admitting: Student in an Organized Health Care Education/Training Program

## 2022-04-11 DIAGNOSIS — M5416 Radiculopathy, lumbar region: Secondary | ICD-10-CM

## 2022-04-30 NOTE — Progress Notes (Signed)
Name: Kendra Gonzalez  MRN/ DOB: [id number], [birth year]    Age/ Sex: 52 y.o., female     PCP: Isaac Bliss, Rayford Halsted, MD   Reason for Endocrinology Evaluation: Hypothyriodism     Initial Endocrinology Clinic Visit: 04/16/2015    PATIENT IDENTIFIER: [name] is a 52 y.o., female with a past medical history of hypothyroididism. She has followed with [facility] since 04/16/2015 for consultative assistance with management of her hypothyroidism.   HISTORICAL SUMMARY: The patient was first diagnosed with MNG and hyperthyroidism in 2016 requiring methimazole intake.   She is S/P total thyroidectomy 10/2020 with benign pathology ( pt declined RAI ablation)   SUBJECTIVE:    Today (05/01/2022):  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  Weight has been stable  Has palpitations  Has occasional tremors  She has been tired  She does not sleep well due to hot flashes and heat intolerance  Denies loose stools or diarrhea  Has noted left local neck swelling   Levothyroxine dose was reduced from 137 to 125 mcg a few months ago    Levothyroxine 125 mcg daily   HISTORY:  Past Medical History:  Past Medical History:  Diagnosis Date   Allergy    Arthritis    Asthma    Cataract    bilateral eyes   Endometriosis    GERD (gastroesophageal reflux disease)    Headache    migraines   History of tetanus, diphtheria, and acellular pertussis booster vaccination (Tdap)    07/30/2010   Hyperlipidemia    Kidney stones    Pancreatitis    Past Surgical History:  Past Surgical History:  Procedure Laterality Date   ABDOMINAL HYSTERECTOMY     arthroscopic knee surgery      carpel tunnel     CHOLECYSTECTOMY     ERCP  01/08/2012   Procedure: ENDOSCOPIC RETROGRADE CHOLANGIOPANCREATOGRAPHY (ERCP);  Surgeon: Beryle Beams, MD;  Location: [facility] ENDOSCOPY;  Service: Endoscopy;  Laterality: N/A;   HIP ARTHROPLASTY     left    HIP SURGERY     oophrectomy  2011   secondary to cysts   THYROIDECTOMY N/A 11/21/2020   Procedure: TOTAL THYROIDECTOMY;  Surgeon: Armandina Gemma, MD;  Location: [facility] ORS;  Service: General;  Laterality: N/A;   TUBAL LIGATION     UPPER GASTROINTESTINAL ENDOSCOPY  2016   Social History:  reports that she has never smoked. She has never used smokeless tobacco. She reports that she does not drink alcohol and does not use drugs. Family History:  Family History  Problem Relation Age of Onset   Heart disease Daughter    Hypertension Father    Hypertension Sister    Hypertension Brother    Hypertension Maternal Grandfather    Heart disease Maternal Grandfather    Hypertension Paternal Grandfather    Hypertension Brother    Hypertension Sister    Thyroid disease Neg Hx    Colon polyps Neg Hx    Colon cancer Neg Hx    Esophageal cancer Neg Hx    Rectal cancer Neg Hx    Stomach cancer Neg Hx      HOME MEDICATIONS: Allergies as of 05/01/2022       Reactions   Hydromorphone Itching        Medication List        Accurate as of May 01, 2022  9:33 AM. If you have any questions, ask your nurse or doctor.  acetaminophen 500 MG tablet Commonly known as: TYLENOL Take 1,000 mg by mouth every 8 (eight) hours as needed for moderate pain.   Advair HFA 115-21 MCG/ACT inhaler Generic drug: fluticasone-salmeterol Inhale 2 puffs into the lungs 2 (two) times daily.   albuterol 108 (90 Base) MCG/ACT inhaler Commonly known as: VENTOLIN HFA Inhale 2 puffs into the lungs every 6 (six) hours as needed for wheezing or shortness of breath.   albuterol (2.5 MG/3ML) 0.083% nebulizer solution Commonly known as: PROVENTIL Take 3 mLs (2.5 mg total) by nebulization every 6 (six) hours as needed for wheezing or shortness of breath.   atorvastatin 40 MG tablet Commonly known as: LIPITOR Take 40 mg by mouth daily.   baclofen 10 MG tablet Commonly known as: LIORESAL Take 1 tablet (10  mg total) by mouth 2 (two) times daily as needed (migraines).   Emgality 120 MG/ML Soaj Generic drug: Galcanezumab-gnlm Inject 120 mg into the skin every 28 (twenty-eight) days.   gabapentin 300 MG capsule Commonly known as: NEURONTIN Take 1 capsule (300 mg total) by mouth 3 (three) times daily.   levothyroxine 125 MCG tablet Commonly known as: SYNTHROID Take 1 tablet (125 mcg total) by mouth daily.   Magnesium Gluconate 550 MG Tabs TAKE 1 TABLET BY MOUTH AT BEDTIME.   promethazine 25 MG tablet Commonly known as: PHENERGAN Take 1 tablet (25 mg total) by mouth once for 1 dose.   Ubrelvy 100 MG Tabs Generic drug: Ubrogepant Take 1 tablet by mouth as needed. May repeat after 2 hours.  Maximum 2 tablets in 24 hours.   valsartan-hydrochlorothiazide 160-25 MG tablet Commonly known as: DIOVAN-HCT Take 1 tablet by mouth daily.          OBJECTIVE:   PHYSICAL EXAM: VS: BP 120/82 (BP Location: Left Arm, Patient Position: Sitting, Cuff Size: Large)   Pulse 76   Ht '5\' 4"'$  (1.626 m)   Wt 192 lb (87.1 kg)   LMP 06/06/1996 Comment: GYN  SpO2 97%   BMI 32.96 kg/m    EXAM: General: Pt appears well and is in NAD  Eyes: External eye exam normal without stare, lid lag or exophthalmos.   Neck: General: Supple without adenopathy. Thyroid: Thyroid size normal.  No goiter or nodules appreciated.   Lungs: Clear with good BS bilat   Heart: Auscultation: RRR.  Extremities:  BL LE: No pretibial edema normal ROM and strength.  Mental Status: Judgment, insight: Intact Orientation: Oriented to time, place, and person Mood and affect: No depression, anxiety, or agitation     DATA REVIEWED:   Latest Reference Range & Units 05/01/22 09:47  TSH 0.35 - 5.50 uIU/mL 1.77    ASSESSMENT / PLAN / RECOMMENDATIONS:   Postoperative Hypothyroidism:  -Pt with hyperthyroid symptoms  - She is S/P total thyroidectomy 10/2020 with benign pathology and secondary to hyperthyroidism  -TSH is  normal  Medications   Continue levothyroxine 125 mcg daily  Follow-up in 4 months  Signed electronically by: Mack Guise, MD  [facility] Endocrinology  [address], [place] [address] Phone: [phone] FAX: [phone]      CC: Isaac Bliss, Rayford Halsted, MD [place] [address] Phone: [phone]  Fax: [phone]   Return to Endocrinology clinic as below: Future Appointments  Date Time Provider [place]  07/13/2022 10:10 AM Pieter Partridge, DO [facility] None

## 2022-05-01 ENCOUNTER — Ambulatory Visit: Payer: 59 | Admitting: Internal Medicine

## 2022-05-01 ENCOUNTER — Encounter: Payer: Self-pay | Admitting: Internal Medicine

## 2022-05-01 VITALS — BP 120/82 | HR 76 | Ht 64.0 in | Wt 192.0 lb

## 2022-05-01 DIAGNOSIS — E039 Hypothyroidism, unspecified: Secondary | ICD-10-CM | POA: Diagnosis not present

## 2022-05-01 DIAGNOSIS — E890 Postprocedural hypothyroidism: Secondary | ICD-10-CM

## 2022-05-01 LAB — TSH: TSH: 1.77 u[IU]/mL (ref 0.35–5.50)

## 2022-05-01 MED ORDER — LEVOTHYROXINE SODIUM 125 MCG PO TABS: 125.0000 ug | ORAL_TABLET | Freq: Every day | ORAL | 3 refills | Status: DC

## 2022-05-01 NOTE — Patient Instructions (Signed)

## 2022-07-08 NOTE — Progress Notes (Signed)
NEUROLOGY FOLLOW UP OFFICE NOTE  Kendra Gonzalez [id number]  Assessment/Plan:   Migraine without aura, without status migrainosus, intractable    Migraine prevention:  Emgality.  Due to HTN, would not use Aimovig.  Not sure why it hasn't been filled.  Her insurance approved it to October.  Advised to contact pharmacy and to let us know if there is a problem Migraine rescue:  Instead of Ubrelvy, will have her try samples of Nurtec.  Zofran. Limit use of pain relievers to no more than 2 days out of week to prevent risk of rebound or medication-overuse headache. Keep headache diary Follow up 5-6 months       Subjective:  Kendra Gonzalez is a 52 year old female with hypothyroidism, HTN, asthma, cataracts and history of kidney stones who follows up for migraines.   UPDATE: Ubrelvy helpful and did not cause drowsiness but not effective    Intensity:  moderate Duration:  2 days with Roselyn Meier Frequency:  3 a month  She did have an intractable migraine in November requiring management in the ED.    Frequency of abortive medication: 2 a month Current NSAIDS/analgesics:  none Current triptans: none Current ergotamine:  none Current anti-emetic:  Zofran 4mg  Current muscle relaxants:   Current Antihypertensive medications:  valsartan-HCTZ Current Antidepressant medications:  none Current Anticonvulsant medications:  none Current anti-CGRP:  Emgality, Ubrelvy 100mg  Current Vitamins/Herbal/Supplements:  magnesium gluconate Current Antihistamines/Decongestants:  none Other therapy: physical therapy for neck pain Hormone/birth control:  none   Caffeine:  No coffee.  May drink tea 2 days a week Alcohol:  No Smoker:  No Diet:  four 16 oz water bottles daily.  No soda.  Does not skip meals Exercise:  walks or runs 30 minutes Depression:  no; Anxiety:  no Other pain:  left hip pain Sleep hygiene:  10 PM to 6 AM.  Toss and turns due to hip pain   HISTORY:  Onset:  52  years old Location:  starts in back of neck and radiates up to behind either eye (usually left side) Quality:  pressure Intensity:  10/10 Aura:  absent Prodrome:  absent Associated symptoms:  Nausea, photophobia, phonophobia, osmophobia, blurred vision.  She denies associated vomiting, unilateral numbness or weakness. Duration:  2-3 days Frequency:  1 to 2 times a week Frequency of abortive medication: Excedrin 3 days a week Triggers:  scents (perfumes), seasonal allergies Relieving factors:  resting in dark quiet room Activity:  aggravates   MRI of brain with and without contrast on 07/18/2019 showed minimal nonspecific T2 signal changes in the cerebral white matter and partially empty sella.  MRI of c-spine on 08/19/2019 revealed mild multilevel cervical spondylosis with mild neural foraminal narrowing but without significant spinal canal stenosis or cord impingement      Past NSAIDS/analgesics:  Fioricet, Excedrin, ibuprofen, indomethacin, meloxicam, diclofenac 75mg , naproxen, tramadol, acetaminophen Past abortive triptans:  sumatriptan 50mg , rizatriptan (drowsiness) Past abortive ergotamine:  none Past muscle relaxants:  baclofen, cyclobenzaprine 5mg  QHS (muscle spasms), tizanidine 2-4mg  PRN (muscle spasms) Past anti-emetic:  Zofran, promethazine Past antihypertensive medications:  propranolol Past antidepressant medications:  venlafaxine Past anticonvulsant medications:  zonisamide, gabapentin, pregablin Past anti-CGRP:  none Past vitamins/Herbal/Supplements:  none Past antihistamines/decongestants:  Xyzal Other past therapies:  none     Family history of headache:  none    PAST MEDICAL HISTORY: Past Medical History:  Diagnosis Date   Allergy    Arthritis    Asthma    Cataract  bilateral eyes   Endometriosis    GERD (gastroesophageal reflux disease)    Headache    migraines   History of tetanus, diphtheria, and acellular pertussis booster vaccination (Tdap)     07/30/2010   Hyperlipidemia    Kidney stones    Pancreatitis     MEDICATIONS: Current Outpatient Medications on File Prior to Visit  Medication Sig Dispense Refill   acetaminophen (TYLENOL) 500 MG tablet Take 1,000 mg by mouth every 8 (eight) hours as needed for moderate pain.     albuterol (PROVENTIL) (2.5 MG/3ML) 0.083% nebulizer solution Take 3 mLs (2.5 mg total) by nebulization every 6 (six) hours as needed for wheezing or shortness of breath. 150 mL 1   albuterol (VENTOLIN HFA) 108 (90 Base) MCG/ACT inhaler Inhale 2 puffs into the lungs every 6 (six) hours as needed for wheezing or shortness of breath. 18 g 2   atorvastatin (LIPITOR) 40 MG tablet Take 40 mg by mouth daily.     baclofen (LIORESAL) 10 MG tablet Take 1 tablet (10 mg total) by mouth 2 (two) times daily as needed (migraines). 90 each 1   fluticasone-salmeterol (ADVAIR HFA) 115-21 MCG/ACT inhaler Inhale 2 puffs into the lungs 2 (two) times daily. 1 each 2   gabapentin (NEURONTIN) 300 MG capsule Take 1 capsule (300 mg total) by mouth 3 (three) times daily. 90 capsule 0   Galcanezumab-gnlm (EMGALITY) 120 MG/ML SOAJ Inject 120 mg into the skin every 28 (twenty-eight) days. 1.12 mL 11   levothyroxine (SYNTHROID) 125 MCG tablet Take 1 tablet (125 mcg total) by mouth daily. 90 tablet 3   Magnesium Gluconate 550 MG TABS TAKE 1 TABLET BY MOUTH AT BEDTIME. 90 tablet 0   promethazine (PHENERGAN) 25 MG tablet Take 1 tablet (25 mg total) by mouth once for 1 dose. 2 tablet 0   Ubrogepant (UBRELVY) 100 MG TABS Take 1 tablet by mouth as needed. May repeat after 2 hours.  Maximum 2 tablets in 24 hours. 16 tablet 11   valsartan-hydrochlorothiazide (DIOVAN-HCT) 160-25 MG tablet Take 1 tablet by mouth daily. 90 tablet 1   [DISCONTINUED] methimazole (TAPAZOLE) 10 MG tablet Take 1 tablet (10 mg total) by mouth 2 (two) times daily. (Patient not taking: Reported on 10/29/2020) 180 tablet 3   No current facility-administered medications on file prior to  visit.    ALLERGIES: Allergies  Allergen Reactions   Hydromorphone Itching    FAMILY HISTORY: Family History  Problem Relation Age of Onset   Heart disease Daughter    Hypertension Father    Hypertension Sister    Hypertension Brother    Hypertension Maternal Grandfather    Heart disease Maternal Grandfather    Hypertension Paternal Grandfather    Hypertension Brother    Hypertension Sister    Thyroid disease Neg Hx    Colon polyps Neg Hx    Colon cancer Neg Hx    Esophageal cancer Neg Hx    Rectal cancer Neg Hx    Stomach cancer Neg Hx       Objective:  Blood pressure (!) 142/92, pulse 69, height 5\' 4"  (1.626 m), weight 189 lb 6.4 oz (85.9 kg), last menstrual period 06/06/1996, SpO2 100 %. General: No acute distress.  Patient appears well-groomed.   Head:  Normocephalic/atraumatic Eyes:  Fundi examined but not visualized Neck: supple, no paraspinal tenderness, full range of motion Heart:  Regular rate and rhythm Neurological Exam: alert and oriented to person, place, and time.  Speech fluent and not dysarthric,  language intact.  CN II-XII intact. Bulk and tone normal, muscle strength 5/5 throughout.  Sensation to light touch intact.  Deep tendon reflexes 2+ throughout, toes downgoing.  Finger to nose testing intact.  Gait normal, Romberg negative.   Metta Clines, DO  CC: Lelon Frohlich, MD

## 2022-07-13 ENCOUNTER — Ambulatory Visit: Payer: 59 | Admitting: Neurology

## 2022-07-13 ENCOUNTER — Encounter: Payer: Self-pay | Admitting: Neurology

## 2022-07-13 VITALS — BP 142/92 | HR 69 | Ht 64.0 in | Wt 189.4 lb

## 2022-07-13 DIAGNOSIS — G43009 Migraine without aura, not intractable, without status migrainosus: Secondary | ICD-10-CM

## 2022-07-13 NOTE — Patient Instructions (Signed)
Emgality should still be covered.  Contact your pharmacy to find out what is going on and let us know if there is a problem Stop Ubrelvy.  Instead, use Nurtec when you get a migraine.  Only one in 24 hours.  Let me know if effective. Follow up 5-6 months.

## 2022-07-21 ENCOUNTER — Other Ambulatory Visit: Payer: Self-pay | Admitting: Neurology

## 2022-07-21 ENCOUNTER — Encounter: Payer: Self-pay | Admitting: Neurology

## 2022-07-21 MED ORDER — SUMATRIPTAN 20 MG/ACT NA SOLN: 20.0000 mg | NASAL | 5 refills | Status: DC | PRN

## 2022-09-04 ENCOUNTER — Encounter: Payer: Self-pay | Admitting: Urgent Care

## 2022-09-04 ENCOUNTER — Ambulatory Visit: Payer: 59 | Admitting: Urgent Care

## 2022-09-04 VITALS — BP 118/88 | HR 83 | Temp 98.4°F | Ht 64.0 in | Wt 189.8 lb

## 2022-09-04 DIAGNOSIS — J45909 Unspecified asthma, uncomplicated: Secondary | ICD-10-CM | POA: Diagnosis not present

## 2022-09-04 DIAGNOSIS — J029 Acute pharyngitis, unspecified: Secondary | ICD-10-CM | POA: Diagnosis not present

## 2022-09-04 DIAGNOSIS — J309 Allergic rhinitis, unspecified: Secondary | ICD-10-CM

## 2022-09-04 LAB — POC COVID19 BINAXNOW: SARS Coronavirus 2 Ag: NEGATIVE

## 2022-09-04 LAB — POCT RAPID STREP A (OFFICE): Rapid Strep A Screen: NEGATIVE

## 2022-09-04 MED ORDER — MONTELUKAST SODIUM 10 MG PO TABS: 10.0000 mg | ORAL_TABLET | Freq: Every day | ORAL | 0 refills | Status: AC

## 2022-09-04 MED ORDER — PREDNISONE 50 MG PO TABS: 50.0000 mg | ORAL_TABLET | Freq: Every day | ORAL | 0 refills | Status: DC

## 2022-09-04 MED ORDER — AZELASTINE HCL 0.1 % NA SOLN: 1.0000 | Freq: Two times a day (BID) | NASAL | 0 refills | Status: DC

## 2022-09-04 MED ORDER — LEVOCETIRIZINE DIHYDROCHLORIDE 5 MG PO TABS: 5.0000 mg | ORAL_TABLET | Freq: Every evening | ORAL | 0 refills | Status: DC

## 2022-09-04 NOTE — Assessment & Plan Note (Signed)
Suspect post nasal drainage the cause of the cough. Will stop cetirizine and switch to xyzal. Will also add short course of montelukast and azelastine nasal spray. Prednisone called in, but encouraged pt to try the above tx first for several days and only start pred if no improvement, or if she develops asthma-like sx.

## 2022-09-04 NOTE — Patient Instructions (Signed)
Stop your cetirizine and switch to xyzal. This is best taken at night. Start taking montelukast nightly as well.  Please use azelastine nasal spray, one spray twice daily.  If your symptoms persist, or no improvement in the next 3-4 days, you may also start taking prednisone on 09/08/22. Take this daily with breakfast.

## 2022-09-04 NOTE — Assessment & Plan Note (Signed)
Appears well controlled today. Lungs CTA, pt without respiratory complaints. Continue home meds as previously ordered.

## 2022-09-04 NOTE — Progress Notes (Signed)
Acute Office Visit  Subjective:     Patient ID: Kendra Gonzalez, female    DOB: Aug 30, 1970, 52 y.o.   MRN: [id number]  Chief Complaint  Patient presents with   Sore Throat    Pt reports cough, sore throat, sinus pressure and postal drainage.     Sore Throat  Associated symptoms include congestion.   Patient is in today for sore throat, sinus congestion, dry cough, post nasal drainage, rhinorrhea and sneezing. Sx present x 4 days. She states her eyes are also itchy and watery. Admits to hx of asthma, is taking her routine Advair and albuterol. Denies wheezing, tightness in the chest or SOB. She states most of her symptoms are in her head. She has been taking benadryl nightly (for years) and just started taking cetirizine during the day. She does not feel these have been effective. Also tried a few doses of sudafed with no relief. Denies fever or mucous production.   Review of Systems  HENT:  Positive for congestion and sore throat.   As per hpi      Objective:    BP 118/88 (BP Location: Right Arm, Patient Position: Sitting, Cuff Size: Large)   Pulse 83   Temp 98.4 F (36.9 C) (Oral)   Ht 5\' 4"  (1.626 m)   Wt 189 lb 12.8 oz (86.1 kg)   LMP 06/06/1996 Comment: GYN  SpO2 98%   BMI 32.58 kg/m    Physical Exam Vitals and nursing note reviewed.  Constitutional:      General: She is not in acute distress.    Appearance: Normal appearance. She is well-developed and normal weight. She is not ill-appearing, toxic-appearing or diaphoretic.  HENT:     Head: Normocephalic and atraumatic.     Right Ear: Ear canal and external ear normal. A middle ear effusion is present. There is no impacted cerumen. Tympanic membrane is not injected, perforated, erythematous or bulging.     Left Ear: Ear canal and external ear normal. A middle ear effusion is present. There is no impacted cerumen. Tympanic membrane is not injected, perforated, erythematous or bulging.     Nose: Congestion  and rhinorrhea present. Rhinorrhea is clear.     Right Turbinates: Enlarged.     Left Turbinates: Enlarged.     Right Sinus: No maxillary sinus tenderness or frontal sinus tenderness.     Left Sinus: No maxillary sinus tenderness or frontal sinus tenderness.     Mouth/Throat:     Mouth: Mucous membranes are moist.     Pharynx: Oropharynx is clear. Uvula midline. No pharyngeal swelling, oropharyngeal exudate, posterior oropharyngeal erythema or uvula swelling.  Eyes:     General: No scleral icterus.       Right eye: No discharge.        Left eye: No discharge.     Extraocular Movements: Extraocular movements intact.     Conjunctiva/sclera: Conjunctivae normal.     Pupils: Pupils are equal, round, and reactive to light.  Cardiovascular:     Rate and Rhythm: Normal rate and regular rhythm.     Heart sounds: No murmur heard. Pulmonary:     Effort: Pulmonary effort is normal. No accessory muscle usage, respiratory distress or retractions.     Breath sounds: Normal breath sounds and air entry. No stridor, decreased air movement or transmitted upper airway sounds. No decreased breath sounds, wheezing, rhonchi or rales.  Abdominal:     Palpations: Abdomen is soft.     Tenderness: There  is no abdominal tenderness.  Musculoskeletal:        General: No swelling.     Cervical back: Normal range of motion and neck supple. No rigidity or tenderness.  Lymphadenopathy:     Cervical: No cervical adenopathy.  Skin:    General: Skin is warm and dry.     Capillary Refill: Capillary refill takes less than 2 seconds.  Neurological:     Mental Status: She is alert.  Psychiatric:        Mood and Affect: Mood normal.     Results for orders placed or performed in visit on 09/04/22  POC COVID-19  Result Value Ref Range   SARS Coronavirus 2 Ag Negative Negative  POC Rapid Strep A  Result Value Ref Range   Rapid Strep A Screen Negative Negative        Assessment & Plan:   Problem List Items  Addressed This Visit       Respiratory   Allergic rhinitis    Suspect post nasal drainage the cause of the cough. Will stop cetirizine and switch to xyzal. Will also add short course of montelukast and azelastine nasal spray. Prednisone called in, but encouraged pt to try the above tx first for several days and only start pred if no improvement, or if she develops asthma-like sx.      Asthma    Appears well controlled today. Lungs CTA, pt without respiratory complaints. Continue home meds as previously ordered.      Relevant Medications   montelukast (SINGULAIR) 10 MG tablet   predniSONE (DELTASONE) 50 MG tablet   Other Visit Diagnoses     Sore throat    -  Primary   Relevant Orders   POC COVID-19 (Completed)   POC Rapid Strep A (Completed)       Meds ordered this encounter  Medications   levocetirizine (XYZAL) 5 MG tablet    Sig: Take 1 tablet (5 mg total) by mouth every evening.    Dispense:  30 tablet    Refill:  0   montelukast (SINGULAIR) 10 MG tablet    Sig: Take 1 tablet (10 mg total) by mouth at bedtime.    Dispense:  30 tablet    Refill:  0   azelastine (ASTELIN) 0.1 % nasal spray    Sig: Place 1 spray into both nostrils 2 (two) times daily. Use in each nostril as directed    Dispense:  30 mL    Refill:  0   predniSONE (DELTASONE) 50 MG tablet    Sig: Take 1 tablet (50 mg total) by mouth daily with breakfast.    Dispense:  5 tablet    Refill:  0    No follow-ups on file.  Agron Swiney L Regis Hinton, PA

## 2022-09-08 ENCOUNTER — Ambulatory Visit: Payer: 59 | Admitting: Internal Medicine

## 2022-09-08 ENCOUNTER — Encounter: Payer: Self-pay | Admitting: Internal Medicine

## 2022-09-08 VITALS — BP 126/74 | HR 74 | Ht 64.0 in | Wt 191.0 lb

## 2022-09-08 DIAGNOSIS — E039 Hypothyroidism, unspecified: Secondary | ICD-10-CM

## 2022-09-08 DIAGNOSIS — E890 Postprocedural hypothyroidism: Secondary | ICD-10-CM | POA: Diagnosis not present

## 2022-09-08 LAB — T4, FREE: Free T4: 1.3 ng/dL (ref 0.60–1.60)

## 2022-09-08 LAB — TSH: TSH: 7.34 u[IU]/mL — ABNORMAL HIGH (ref 0.35–5.50)

## 2022-09-08 NOTE — Patient Instructions (Signed)

## 2022-09-08 NOTE — Progress Notes (Unsigned)
Name: Kendra Gonzalez  MRN/ DOB: [id number], [birth year]    Age/ Sex: 52 y.o., female     PCP: Philip Aspen, Limmie Patricia, MD   Reason for Endocrinology Evaluation: Hypothyriodism     Initial Endocrinology Clinic Visit: 04/16/2015    PATIENT IDENTIFIER: Kendra Gonzalez is a 52 y.o., female with a past medical history of hypothyroididism. She has followed with [facility] since 04/16/2015 for consultative assistance with management of her hypothyroidism.   HISTORICAL SUMMARY: The patient was first diagnosed with MNG and hyperthyroidism in 2016 requiring methimazole intake.   She is S/P total thyroidectomy 10/2020 with benign pathology ( pt declined RAI ablation)   She was followed by Dr. 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  Weight continues to be stable  Denies palpitations  Denies  tremors  Denies loose stools or diarrhea  Has noted left local neck swelling     Levothyroxine 125 mcg daily   HISTORY:  Past Medical History:  Past Medical History:  Diagnosis Date   Allergy    Arthritis    Asthma    Cataract    bilateral eyes   Endometriosis    GERD (gastroesophageal reflux disease)    Headache    migraines   History of tetanus, diphtheria, and acellular pertussis booster vaccination (Tdap)    07/30/2010   Hyperlipidemia    Kidney stones    Pancreatitis    Past Surgical History:  Past Surgical History:  Procedure Laterality Date   ABDOMINAL HYSTERECTOMY     arthroscopic knee surgery      carpel tunnel     CHOLECYSTECTOMY     ERCP  01/08/2012   Procedure: ENDOSCOPIC RETROGRADE CHOLANGIOPANCREATOGRAPHY (ERCP);  Surgeon: Theda Belfast, MD;  Location[facility];  Service: Endoscopy;  Laterality: N/A;   HIP ARTHROPLASTY     left   HIP SURGERY     oophrectomy  2011   secondary to cysts   THYROIDECTOMY N/A 11/21/2020    Procedure: TOTAL THYROIDECTOMY;  Surgeon: Darnell Level, MD;  Location: [facility] ORS;  Service: General;  Laterality: N/A;   TUBAL LIGATION     UPPER GASTROINTESTINAL ENDOSCOPY  2016   Social History:  reports that she has never smoked. She has never used smokeless tobacco. She reports that she does not drink alcohol and does not use drugs. Family History:  Family History  Problem Relation Age of Onset   Heart disease Daughter    Hypertension Father    Hypertension Sister    Hypertension Brother    Hypertension Maternal Grandfather    Heart disease Maternal Grandfather    Hypertension Paternal Grandfather    Hypertension Brother    Hypertension Sister    Thyroid disease Neg Hx    Colon polyps Neg Hx    Colon cancer Neg Hx    Esophageal cancer Neg Hx    Rectal cancer Neg Hx    Stomach cancer Neg Hx      HOME MEDICATIONS: Allergies as of 09/08/2022       Reactions   Hydromorphone Itching        Medication List        Accurate as of Sep 08, 2022  8:12 AM. If you have any questions, ask your nurse or doctor.          STOP taking these medications    predniSONE 50 MG tablet  Commonly known as: DELTASONE Stopped by: Scarlette Shorts, MD       TAKE these medications    acetaminophen 500 MG tablet Commonly known as: TYLENOL Take 1,000 mg by mouth every 8 (eight) hours as needed for moderate pain.   Advair HFA 115-21 MCG/ACT inhaler Generic drug: fluticasone-salmeterol Inhale 2 puffs into the lungs 2 (two) times daily.   albuterol 108 (90 Base) MCG/ACT inhaler Commonly known as: VENTOLIN HFA Inhale 2 puffs into the lungs every 6 (six) hours as needed for wheezing or shortness of breath.   albuterol (2.5 MG/3ML) 0.083% nebulizer solution Commonly known as: PROVENTIL Take 3 mLs (2.5 mg total) by nebulization every 6 (six) hours as needed for wheezing or shortness of breath.   azelastine 0.1 % nasal spray Commonly known as: ASTELIN Place 1 spray into both  nostrils 2 (two) times daily. Use in each nostril as directed   baclofen 10 MG tablet Commonly known as: LIORESAL Take 1 tablet (10 mg total) by mouth 2 (two) times daily as needed (migraines).   Emgality 120 MG/ML Soaj Generic drug: Galcanezumab-gnlm INJECT 120MG  INTO THE SKIN EVERY 28 DAYS   gabapentin 300 MG capsule Commonly known as: NEURONTIN Take 1 capsule (300 mg total) by mouth 3 (three) times daily.   levocetirizine 5 MG tablet Commonly known as: XYZAL Take 1 tablet (5 mg total) by mouth every evening.   levothyroxine 125 MCG tablet Commonly known as: SYNTHROID Take 1 tablet (125 mcg total) by mouth daily.   Magnesium Gluconate 550 MG Tabs TAKE 1 TABLET BY MOUTH AT BEDTIME.   montelukast 10 MG tablet Commonly known as: SINGULAIR Take 1 tablet (10 mg total) by mouth at bedtime.   SUMAtriptan 20 MG/ACT nasal spray Commonly known as: IMITREX Place 1 spray (20 mg total) into the nose as needed for migraine or headache. May repeat in 2 hours if headache persists or recurs.  Maximum 2 sprays in 24 hours.   valsartan-hydrochlorothiazide 160-25 MG tablet Commonly known as: DIOVAN-HCT Take 1 tablet by mouth daily.          OBJECTIVE:   PHYSICAL EXAM: VS: BP 126/74 (BP Location: Left Arm, Patient Position: Sitting, Cuff Size: Large)   Pulse 74   Ht 5\' 4"  (1.626 m)   Wt 191 lb (86.6 kg)   LMP 06/06/1996 Comment: GYN  SpO2 97%   BMI 32.79 kg/m    EXAM: General: Pt appears well and is in NAD  Eyes: External eye exam normal without stare, lid lag or exophthalmos.   Neck: General: Supple without adenopathy. Thyroid: Thyroid size normal.  No goiter or nodules appreciated.   Lungs: Clear with good BS bilat   Heart: Auscultation: RRR.  Extremities:  BL LE: No pretibial edema   Mental Status: Judgment, insight: Intact Orientation: Oriented to time, place, and person Mood and affect: No depression, anxiety, or agitation     DATA REVIEWED:   Latest  Reference Range & Units 05/01/22 09:47  TSH 0.35 - 5.50 uIU/mL 1.77    ASSESSMENT / PLAN / RECOMMENDATIONS:   Postoperative Hypothyroidism:  -Pt with hyperthyroid symptoms  - She is S/P total thyroidectomy 10/2020 with benign pathology and secondary to hyperthyroidism  -TSH is normal  Medications   Continue levothyroxine 125 mcg daily  Follow-up in 4 months  Signed electronically by: Lyndle Herrlich, MD  [facility]  [facility] [address] [place], [place] [address] Phone: [phone] FAX: [phone]  CC: Philip Aspen, Limmie Patricia, MD [address] [place] [address] Phone: [phone]  Fax: [phone]   Return to Endocrinology clinic as below: Future Appointments  Date Time Provider Department Center  01/14/2023  8:50 AM Drema Dallas, DO [facility] None

## 2022-09-09 ENCOUNTER — Encounter: Payer: Self-pay | Admitting: Internal Medicine

## 2022-09-09 MED ORDER — LEVOTHYROXINE SODIUM 125 MCG PO TABS: 125.0000 ug | ORAL_TABLET | Freq: Every day | ORAL | 3 refills | Status: DC

## 2022-09-10 NOTE — Telephone Encounter (Signed)
Please assist with scheduling next appt.

## 2022-10-25 ENCOUNTER — Ambulatory Visit
Admission: EM | Admit: 2022-10-25 | Discharge: 2022-10-25 | Disposition: A | Discharge status: Home / Self Care | Payer: 59 | Attending: Internal Medicine | Admitting: Internal Medicine

## 2022-10-25 ENCOUNTER — Encounter: Payer: Self-pay | Admitting: Emergency Medicine

## 2022-10-25 DIAGNOSIS — J020 Streptococcal pharyngitis: Secondary | ICD-10-CM

## 2022-10-25 LAB — POCT RAPID STREP A (OFFICE): Rapid Strep A Screen: POSITIVE — AB

## 2022-10-25 MED ORDER — AMOXICILLIN 500 MG PO CAPS: 500.0000 mg | ORAL_CAPSULE | Freq: Two times a day (BID) | ORAL | 0 refills | Status: AC

## 2022-10-25 NOTE — ED Provider Notes (Signed)
[name]    CSN: [id number] Arrival date & time: 10/25/22  0802      History   Chief Complaint Chief Complaint  Patient presents with   Sore Throat   Generalized Body Aches   Abdominal Pain    Abdominal pain started today    HPI Kendra Gonzalez is a 52 y.o. female.   Patient presents with 2-day history of sore throat, body aches, nausea, vomiting.  Patient denies nasal congestion, runny nose, cough, diarrhea.  Denies blood in emesis.  Reports that she ha rapid strep was positive d a Tmax of 100.6 at home.  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  Patient not reporting any chest pain or shortness of breath.  She has not taken any medications for symptoms.   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   Unilateral primary osteoarthritis, left knee 07/16/2020   Presence of left artificial hip joint 07/09/2017   Multinodular goiter 04/17/2015   History of stomach ulcers 03/22/2015   Chronic pain of left heel 04/23/2014   Chronic pain syndrome 03/19/2014   Asthma 01/04/2013   BMI 29.0-29.9,adult 10/12/2011   Paroxysmal nerve pain 01/22/2011   Hyperlipidemia 02/16/2007   Allergic rhinitis 02/16/2007   GERD 02/16/2007    Past Surgical History:  Procedure Laterality Date   ABDOMINAL HYSTERECTOMY     arthroscopic knee  surgery      carpel tunnel     CHOLECYSTECTOMY     ERCP  01/08/2012   Procedure: ENDOSCOPIC RETROGRADE CHOLANGIOPANCREATOGRAPHY (ERCP);  Surgeon: Theda Belfast, MD;  Location: [facility] ENDOSCOPY;  Service: Endoscopy;  Laterality: N/A;   HIP ARTHROPLASTY     left   HIP SURGERY     oophrectomy  2011   secondary to cysts   THYROIDECTOMY N/A 11/21/2020   Procedure: TOTAL THYROIDECTOMY;  Surgeon: Darnell Level, MD;  Location: [facility] ORS;  Service: General;  Laterality: N/A;   TUBAL LIGATION     UPPER GASTROINTESTINAL ENDOSCOPY  2016    OB History   No obstetric history on file.      Home Medications    Prior to Admission medications   Medication Sig Start Date End Date Taking? Authorizing Provider  amoxicillin (AMOXIL) 500 MG capsule Take 1 capsule (500 mg total) by mouth 2 (two) times daily for 10 days. 10/25/22 11/04/22 Yes Darci Lykins, Acie Fredrickson, FNP  acetaminophen (TYLENOL) 500 MG tablet Take 1,000 mg by mouth every 8 (eight) hours as needed for moderate pain.    [provider]  albuterol (PROVENTIL) (2.5 MG/3ML) 0.083% nebulizer solution Take 3 mLs (2.5 mg total) by nebulization every 6 (six) hours as needed for wheezing or shortness of breath. 03/19/21   Philip Aspen, Limmie Patricia, MD  albuterol (VENTOLIN HFA) 108 (  90 Base) MCG/ACT inhaler Inhale 2 puffs into the lungs every 6 (six) hours as needed for wheezing or shortness of breath. 03/19/21   Philip Aspen, Limmie Patricia, MD  azelastine (ASTELIN) 0.1 % nasal spray Place 1 spray into both nostrils 2 (two) times daily. Use in each nostril as directed 09/04/22   Crain, Whitney L, PA  baclofen (LIORESAL) 10 MG tablet Take 1 tablet (10 mg total) by mouth 2 (two) times daily as needed (migraines). 01/13/22   Willow Ora, MD  fluticasone-salmeterol (ADVAIR HFA) [phone] MCG/ACT inhaler Inhale 2 puffs into the lungs 2 (two) times daily. 03/19/21   Philip Aspen, Limmie Patricia, MD  gabapentin (NEURONTIN) 300 MG capsule Take 1 capsule (300 mg total) by  mouth 3 (three) times daily. 01/13/22 07/13/22  Willow Ora, MD  Galcanezumab-gnlm [facility]) 120 MG/ML SOAJ INJECT 120MG  INTO THE SKIN EVERY 28 DAYS 07/21/22   Drema Dallas, DO  levocetirizine (XYZAL) 5 MG tablet Take 1 tablet (5 mg total) by mouth every evening. 09/04/22   Crain, Alphonzo Lemmings L, PA  levothyroxine (SYNTHROID) 125 MCG tablet Take 1 tablet (125 mcg total) by mouth daily. 09/09/22   Shamleffer, Konrad Dolores, MD  Magnesium Gluconate 550 MG TABS TAKE 1 TABLET BY MOUTH AT BEDTIME. 02/11/22   Philip Aspen, Limmie Patricia, MD  montelukast (SINGULAIR) 10 MG tablet Take 1 tablet (10 mg total) by mouth at bedtime. 09/04/22   Crain, Whitney L, PA  SUMAtriptan (IMITREX) 20 MG/ACT nasal spray Place 1 spray (20 mg total) into the nose as needed for migraine or headache. May repeat in 2 hours if headache persists or recurs.  Maximum 2 sprays in 24 hours. 07/21/22   Everlena Cooper, Adam R, DO  valsartan-hydrochlorothiazide (DIOVAN-HCT) 160-25 MG tablet Take 1 tablet by mouth daily. 06/24/21   Philip Aspen, Limmie Patricia, MD  methimazole (TAPAZOLE) 10 MG tablet Take 1 tablet (10 mg total) by mouth 2 (two) times daily. Patient not taking: Reported on 10/29/2020 08/06/20 11/21/20  Romero Belling, MD    Family History Family History  Problem Relation Age of Onset   Heart disease Daughter    Hypertension Father    Hypertension Sister    Hypertension Brother    Hypertension Maternal Grandfather    Heart disease Maternal Grandfather    Hypertension Paternal Grandfather    Hypertension Brother    Hypertension Sister    Thyroid disease Neg Hx    Colon polyps Neg Hx    Colon cancer Neg Hx    Esophageal cancer Neg Hx    Rectal cancer Neg Hx    Stomach cancer Neg Hx     Social History Social History   Tobacco Use   Smoking status: Never   Smokeless tobacco: Never  Vaping Use   Vaping Use: Never used  Substance Use Topics   Alcohol use: No   Drug use: No     Allergies   Hydromorphone   Review of  Systems Review of Systems Per HPI  Physical Exam Triage Vital Signs ED Triage Vitals  Enc Vitals Group     BP 10/25/22 0815 (!) 143/89     Pulse Rate 10/25/22 0815 (!) 103     Resp 10/25/22 0815 16     Temp 10/25/22 0815 98.2 F (36.8 C)     Temp Source 10/25/22 0815 Oral     SpO2 10/25/22 0815 97 %     Weight --      Height --  Head Circumference --      Peak Flow --      Pain Score 10/25/22 0813 6     Pain Loc --      Pain Edu? --      Excl. in GC? --    No data found.  Updated Vital Signs BP (!) 143/89 (BP Location: Left Arm)   Pulse (!) 103   Temp 98.2 F (36.8 C) (Oral)   Resp 16   LMP 06/06/1996 Comment: GYN  SpO2 97%   Visual Acuity Right Eye Distance:   Left Eye Distance:   Bilateral Distance:    Right Eye Near:   Left Eye Near:    Bilateral Near:     Physical Exam Constitutional:      General: She is not in acute distress.    Appearance: Normal appearance. She is not toxic-appearing or diaphoretic.  HENT:     Head: Normocephalic and atraumatic.     Right Ear: Tympanic membrane and ear canal normal.     Left Ear: Tympanic membrane and ear canal normal.     Nose: Nose normal. No congestion.     Mouth/Throat:     Mouth: Mucous membranes are moist.     Pharynx: Posterior oropharyngeal erythema present.  Eyes:     Extraocular Movements: Extraocular movements intact.     Conjunctiva/sclera: Conjunctivae normal.     Pupils: Pupils are equal, round, and reactive to light.  Cardiovascular:     Rate and Rhythm: Normal rate and regular rhythm.     Pulses: Normal pulses.     Heart sounds: Normal heart sounds.  Pulmonary:     Effort: Pulmonary effort is normal. No respiratory distress.     Breath sounds: Normal breath sounds. No stridor. No wheezing, rhonchi or rales.  Abdominal:     General: Abdomen is flat. Bowel sounds are normal. There is no distension.     Palpations: Abdomen is soft.     Tenderness: There is no abdominal tenderness.   Musculoskeletal:        General: Normal range of motion.     Cervical back: Normal range of motion.  Skin:    General: Skin is warm and dry.  Neurological:     General: No focal deficit present.     Mental Status: She is alert and oriented to person, place, and time. Mental status is at baseline.  Psychiatric:        Mood and Affect: Mood normal.        Behavior: Behavior normal.      UC Treatments / Results  Labs (all labs ordered are listed, but only abnormal results are displayed) Labs Reviewed  POCT RAPID STREP A (OFFICE) - Abnormal; Notable for the following components:      Result Value   Rapid Strep A Screen Positive (*)    All other components within normal limits    EKG   Radiology No results found.  Procedures Procedures (including critical care time)  Medications Ordered in UC Medications - No data to display  Initial Impression / Assessment and Plan / UC Course  I have reviewed the triage vital signs and the nursing notes.  Pertinent labs & imaging results that were available during my care of the patient were reviewed by me and considered in my medical decision making (see chart for details).     Rapid strep was positive so will treat with Amoxicillin antibiotic.  No signs of peritonsillar abscess on exam.  Advised supportive care and symptom management.  Advised strict return precautions.  Patient verbalized understanding and was agreeable with plan. Final Clinical Impressions(s) / UC Diagnoses   Final diagnoses:  Strep pharyngitis     Discharge Instructions      You have strep throat which is being treated with an antibiotic.  Change toothbrush on day 3-4 of antibiotic.  Follow-up if any symptoms persist or worsen.    ED Prescriptions     Medication Sig Dispense Auth. Provider   amoxicillin (AMOXIL) 500 MG capsule Take 1 capsule (500 mg total) by mouth 2 (two) times daily for 10 days. 20 capsule Gustavus Bryant, [place]      PDMP not  reviewed this encounter.   Gustavus Bryant, [place] 10/25/22 [phone]

## 2022-10-25 NOTE — ED Triage Notes (Signed)
Pt said since Friday has had sore throat with body aches, covid test was negative. Today she woke up with upper gastric pain with nausea.

## 2022-10-25 NOTE — Discharge Instructions (Signed)
You have strep throat which is being treated with an antibiotic.  Change toothbrush on day 3-4 of antibiotic.  Follow-up if any symptoms persist or worsen. 

## 2022-11-08 ENCOUNTER — Other Ambulatory Visit: Payer: Self-pay | Admitting: Neurology

## 2022-12-02 ENCOUNTER — Telehealth: Payer: Self-pay | Admitting: Internal Medicine

## 2022-12-02 NOTE — Progress Notes (Unsigned)
Patient was called FMLA paperwork was filled out and is ready for pick-up.

## 2022-12-02 NOTE — Telephone Encounter (Signed)
Patient dropped off document  [employer]  , to be filled out by provider. Patient requested to send it back via Call Patient to pick up and fax within 5-days. Document is located in providers tray at front office.Please advise at Mobile [phone] (mobile)

## 2022-12-12 ENCOUNTER — Other Ambulatory Visit: Payer: Self-pay | Admitting: Neurology

## 2022-12-15 ENCOUNTER — Encounter: Payer: Self-pay | Admitting: Internal Medicine

## 2022-12-15 ENCOUNTER — Telehealth: Payer: Self-pay | Admitting: Internal Medicine

## 2022-12-15 NOTE — Telephone Encounter (Signed)
Patient dropped off document  Disability , to be filled out by provider. Patient requested to send it back via Fax within 7-days. Document is located in providers tray at front office.Please advise at  fax [phone]

## 2022-12-17 NOTE — Telephone Encounter (Signed)
Form faxed, confirmed, and the patient is aware.

## 2022-12-18 ENCOUNTER — Other Ambulatory Visit: Payer: Self-pay | Admitting: Internal Medicine

## 2022-12-18 DIAGNOSIS — Z1231 Encounter for screening mammogram for malignant neoplasm of breast: Secondary | ICD-10-CM

## 2023-01-05 ENCOUNTER — Encounter: Payer: Self-pay | Admitting: Internal Medicine

## 2023-01-07 ENCOUNTER — Encounter: Payer: Self-pay | Admitting: Internal Medicine

## 2023-01-07 NOTE — Telephone Encounter (Signed)
Medical certification form in response to an accommodation request refaxed

## 2023-01-12 NOTE — Progress Notes (Unsigned)
NEUROLOGY FOLLOW UP OFFICE NOTE  Kendra Gonzalez [id number]  Assessment/Plan:   Migraine without aura, without status migrainosus, intractable    Migraine prevention:  Emgality.  Due to HTN, would not use Aimovig. *** Migraine rescue:  Nurtec.  Zofran. *** Limit use of pain relievers to no more than 2 days out of week to prevent risk of rebound or medication-overuse headache. Keep headache diary Follow up ***       Subjective:  Kendra Gonzalez is a 52 year old female with hypothyroidism, HTN, asthma, cataracts and history of kidney stones who follows up for migraines.   UPDATE: Ubrelvy helpful and did not cause drowsiness but not effective    Intensity:  moderate Duration:  *** with Nurtec Frequency:  3 a month ***   Frequency of abortive medication: 2 a month Current NSAIDS/analgesics:  none Current triptans: none Current ergotamine:  none Current anti-emetic:  Zofran 4mg  Current muscle relaxants:   Current Antihypertensive medications:  valsartan-HCTZ Current Antidepressant medications:  none Current Anticonvulsant medications:  none Current anti-CGRP:  Emgality, Nurtec PRN Current Vitamins/Herbal/Supplements:  magnesium gluconate Current Antihistamines/Decongestants:  none Other therapy: physical therapy for neck pain Hormone/birth control:  none   Caffeine:  No coffee.  May drink tea 2 days a week Alcohol:  No Smoker:  No Diet:  four 16 oz water bottles daily.  No soda.  Does not skip meals Exercise:  walks or runs 30 minutes Depression:  no; Anxiety:  no Other pain:  left hip pain Sleep hygiene:  10 PM to 6 AM.  Toss and turns due to hip pain   HISTORY:  Onset:  52 years old Location:  starts in back of neck and radiates up to behind either eye (usually left side) Quality:  pressure Intensity:  10/10 Aura:  absent Prodrome:  absent Associated symptoms:  Nausea, photophobia, phonophobia, osmophobia, blurred vision.  She denies associated  vomiting, unilateral numbness or weakness. Duration:  2-3 days Frequency:  1 to 2 times a week Frequency of abortive medication: Excedrin 3 days a week Triggers:  scents (perfumes), seasonal allergies Relieving factors:  resting in dark quiet room Activity:  aggravates   MRI of brain with and without contrast on 07/18/2019 showed minimal nonspecific T2 signal changes in the cerebral white matter and partially empty sella.  MRI of c-spine on 08/19/2019 revealed mild multilevel cervical spondylosis with mild neural foraminal narrowing but without significant spinal canal stenosis or cord impingement      Past NSAIDS/analgesics:  Fioricet, Excedrin, ibuprofen, indomethacin, meloxicam, diclofenac 75mg , naproxen, tramadol, acetaminophen Past abortive triptans:  sumatriptan 50mg , rizatriptan (drowsiness) Past abortive ergotamine:  none Past muscle relaxants:  baclofen, cyclobenzaprine 5mg  QHS (muscle spasms), tizanidine 2-4mg  PRN (muscle spasms) Past anti-emetic:  Zofran, promethazine Past antihypertensive medications:  propranolol Past antidepressant medications:  venlafaxine Past anticonvulsant medications:  zonisamide, gabapentin, pregablin Past anti-CGRP:  Ubrelvy 100mg  Past vitamins/Herbal/Supplements:  none Past antihistamines/decongestants:  Xyzal Other past therapies:  none     Family history of headache:  none    PAST MEDICAL HISTORY: Past Medical History:  Diagnosis Date   Allergy    Arthritis    Asthma    Cataract    bilateral eyes   Endometriosis    GERD (gastroesophageal reflux disease)    Headache    migraines   History of tetanus, diphtheria, and acellular pertussis booster vaccination (Tdap)    07/30/2010   Hyperlipidemia    Kidney stones    Pancreatitis  MEDICATIONS: Current Outpatient Medications on File Prior to Visit  Medication Sig Dispense Refill   acetaminophen (TYLENOL) 500 MG tablet Take 1,000 mg by mouth every 8 (eight) hours as needed for moderate  pain.     albuterol (PROVENTIL) (2.5 MG/3ML) 0.083% nebulizer solution Take 3 mLs (2.5 mg total) by nebulization every 6 (six) hours as needed for wheezing or shortness of breath. 150 mL 1   albuterol (VENTOLIN HFA) 108 (90 Base) MCG/ACT inhaler Inhale 2 puffs into the lungs every 6 (six) hours as needed for wheezing or shortness of breath. 18 g 2   azelastine (ASTELIN) 0.1 % nasal spray Place 1 spray into both nostrils 2 (two) times daily. Use in each nostril as directed 30 mL 0   baclofen (LIORESAL) 10 MG tablet Take 1 tablet (10 mg total) by mouth 2 (two) times daily as needed (migraines). 90 each 1   EMGALITY 120 MG/ML SOAJ INJECT 120MG  INTO THE SKIN EVERY 28 DAYS 1 mL 0   fluticasone-salmeterol (ADVAIR HFA) 115-21 MCG/ACT inhaler Inhale 2 puffs into the lungs 2 (two) times daily. 1 each 2   gabapentin (NEURONTIN) 300 MG capsule Take 1 capsule (300 mg total) by mouth 3 (three) times daily. 90 capsule 0   levocetirizine (XYZAL) 5 MG tablet Take 1 tablet (5 mg total) by mouth every evening. 30 tablet 0   levothyroxine (SYNTHROID) 125 MCG tablet Take 1 tablet (125 mcg total) by mouth daily. 90 tablet 3   Magnesium Gluconate 550 MG TABS TAKE 1 TABLET BY MOUTH AT BEDTIME. 90 tablet 0   montelukast (SINGULAIR) 10 MG tablet Take 1 tablet (10 mg total) by mouth at bedtime. 30 tablet 0   SUMAtriptan (IMITREX) 20 MG/ACT nasal spray Place 1 spray (20 mg total) into the nose as needed for migraine or headache. May repeat in 2 hours if headache persists or recurs.  Maximum 2 sprays in 24 hours. 6 each 5   valsartan-hydrochlorothiazide (DIOVAN-HCT) 160-25 MG tablet Take 1 tablet by mouth daily. 90 tablet 1   [DISCONTINUED] methimazole (TAPAZOLE) 10 MG tablet Take 1 tablet (10 mg total) by mouth 2 (two) times daily. (Patient not taking: Reported on 10/29/2020) 180 tablet 3   No current facility-administered medications on file prior to visit.    ALLERGIES: Allergies  Allergen Reactions   Hydromorphone  Itching    FAMILY HISTORY: Family History  Problem Relation Age of Onset   Heart disease Daughter    Hypertension Father    Hypertension Sister    Hypertension Brother    Hypertension Maternal Grandfather    Heart disease Maternal Grandfather    Hypertension Paternal Grandfather    Hypertension Brother    Hypertension Sister    Thyroid disease Neg Hx    Colon polyps Neg Hx    Colon cancer Neg Hx    Esophageal cancer Neg Hx    Rectal cancer Neg Hx    Stomach cancer Neg Hx       Objective:  *** General: No acute distress.  Patient appears well-groomed.   Head:  Normocephalic/atraumatic Eyes:  Fundi examined but not visualized Neck: supple, no paraspinal tenderness, full range of motion Heart:  Regular rate and rhythm Neurological Exam: ***   Shon Millet, DO  CC: Chaya Jan, MD

## 2023-01-14 ENCOUNTER — Encounter: Payer: Self-pay | Admitting: Neurology

## 2023-01-14 ENCOUNTER — Encounter: Payer: Self-pay | Admitting: Internal Medicine

## 2023-01-14 ENCOUNTER — Ambulatory Visit: Payer: 59 | Admitting: Neurology

## 2023-01-14 VITALS — BP 135/88 | HR 79 | Ht 64.0 in | Wt 190.4 lb

## 2023-01-14 DIAGNOSIS — G43009 Migraine without aura, not intractable, without status migrainosus: Secondary | ICD-10-CM | POA: Diagnosis not present

## 2023-01-14 MED ORDER — ONDANSETRON HCL 4 MG PO TABS: 4.0000 mg | ORAL_TABLET | Freq: Four times a day (QID) | ORAL | 11 refills | Status: DC

## 2023-01-14 MED ORDER — EMGALITY 120 MG/ML ~~LOC~~ SOAJ: 120.0000 mg | SUBCUTANEOUS | 11 refills | Status: DC

## 2023-01-14 MED ORDER — SUMATRIPTAN 20 MG/ACT NA SOLN: 20.0000 mg | NASAL | 11 refills | Status: DC | PRN

## 2023-01-14 NOTE — Patient Instructions (Signed)
Emgality every 28 days Sumatriptan nasal spray as needed/directed Ondansetron/zofran for nausea

## 2023-02-02 NOTE — Telephone Encounter (Signed)
Spoke with patient Kendra Gonzalez has the Correct forms.

## 2023-02-05 ENCOUNTER — Ambulatory Visit: Payer: 59

## 2023-02-11 ENCOUNTER — Ambulatory Visit
Admission: RE | Admit: 2023-02-11 | Discharge: 2023-02-11 | Disposition: A | Discharge status: Home / Self Care | Payer: 59 | Source: Ambulatory Visit | Attending: Internal Medicine | Admitting: Internal Medicine

## 2023-02-11 DIAGNOSIS — Z1231 Encounter for screening mammogram for malignant neoplasm of breast: Secondary | ICD-10-CM

## 2023-03-05 ENCOUNTER — Other Ambulatory Visit (HOSPITAL_COMMUNITY): Payer: Self-pay

## 2023-03-22 ENCOUNTER — Ambulatory Visit (INDEPENDENT_AMBULATORY_CARE_PROVIDER_SITE_OTHER): Payer: 59 | Admitting: Internal Medicine

## 2023-03-22 VITALS — BP 120/84 | HR 71 | Temp 97.7°F | Ht 65.0 in | Wt 194.5 lb

## 2023-03-22 DIAGNOSIS — E559 Vitamin D deficiency, unspecified: Secondary | ICD-10-CM | POA: Diagnosis not present

## 2023-03-22 DIAGNOSIS — I10 Essential (primary) hypertension: Secondary | ICD-10-CM

## 2023-03-22 DIAGNOSIS — E039 Hypothyroidism, unspecified: Secondary | ICD-10-CM | POA: Diagnosis not present

## 2023-03-22 DIAGNOSIS — E785 Hyperlipidemia, unspecified: Secondary | ICD-10-CM

## 2023-03-22 DIAGNOSIS — R7302 Impaired glucose tolerance (oral): Secondary | ICD-10-CM

## 2023-03-22 DIAGNOSIS — M654 Radial styloid tenosynovitis [de Quervain]: Secondary | ICD-10-CM | POA: Diagnosis not present

## 2023-03-22 DIAGNOSIS — Z0000 Encounter for general adult medical examination without abnormal findings: Secondary | ICD-10-CM | POA: Diagnosis not present

## 2023-03-22 LAB — CBC WITH DIFFERENTIAL/PLATELET
Basophils Absolute: 0.1 10*3/uL (ref 0.0–0.1)
Basophils Relative: 1.1 % (ref 0.0–3.0)
Eosinophils Absolute: 0.2 10*3/uL (ref 0.0–0.7)
Eosinophils Relative: 4 % (ref 0.0–5.0)
HCT: 43.3 % (ref 36.0–46.0)
Hemoglobin: 14.4 g/dL (ref 12.0–15.0)
Lymphocytes Relative: 41.7 % (ref 12.0–46.0)
Lymphs Abs: 2.2 10*3/uL (ref 0.7–4.0)
MCHC: 33.3 g/dL (ref 30.0–36.0)
MCV: 88.6 fL (ref 78.0–100.0)
Monocytes Absolute: 0.3 10*3/uL (ref 0.1–1.0)
Monocytes Relative: 4.7 % (ref 3.0–12.0)
Neutro Abs: 2.6 10*3/uL (ref 1.4–7.7)
Neutrophils Relative %: 48.5 % (ref 43.0–77.0)
Platelets: 279 10*3/uL (ref 150.0–400.0)
RBC: 4.89 Mil/uL (ref 3.87–5.11)
RDW: 14.3 % (ref 11.5–15.5)
WBC: 5.4 10*3/uL (ref 4.0–10.5)

## 2023-03-22 LAB — COMPREHENSIVE METABOLIC PANEL
ALT: 9 U/L (ref 0–35)
AST: 16 U/L (ref 0–37)
Albumin: 4.5 g/dL (ref 3.5–5.2)
Alkaline Phosphatase: 113 U/L (ref 39–117)
BUN: 11 mg/dL (ref 6–23)
CO2: 29 meq/L (ref 19–32)
Calcium: 10.3 mg/dL (ref 8.4–10.5)
Chloride: 105 meq/L (ref 96–112)
Creatinine, Ser: 0.92 mg/dL (ref 0.40–1.20)
GFR: 70.79 mL/min (ref >60.00)
Glucose, Bld: 92 mg/dL (ref 70–99)
Potassium: 4.3 meq/L (ref 3.5–5.1)
Sodium: 141 meq/L (ref 135–145)
Total Bilirubin: 1 mg/dL (ref 0.2–1.2)
Total Protein: 7.4 g/dL (ref 6.0–8.3)

## 2023-03-22 LAB — LIPID PANEL
Cholesterol: 255 mg/dL — ABNORMAL HIGH (ref 0–200)
HDL: 70.9 mg/dL (ref >39.00)
LDL Cholesterol: 174 mg/dL — ABNORMAL HIGH (ref 0–99)
NonHDL: 183.68
Total CHOL/HDL Ratio: 4
Triglycerides: 47 mg/dL (ref 0.0–149.0)
VLDL: 9.4 mg/dL (ref 0.0–40.0)

## 2023-03-22 LAB — VITAMIN D 25 HYDROXY (VIT D DEFICIENCY, FRACTURES): VITD: 19.06 ng/mL — ABNORMAL LOW (ref 30.00–100.00)

## 2023-03-22 LAB — HEMOGLOBIN A1C: Hgb A1c MFr Bld: 5.8 % (ref 4.6–6.5)

## 2023-03-22 LAB — VITAMIN B12: Vitamin B-12: 191 pg/mL — ABNORMAL LOW (ref 211–911)

## 2023-03-22 LAB — TSH: TSH: 0.15 u[IU]/mL — ABNORMAL LOW (ref 0.35–5.50)

## 2023-03-22 MED ORDER — MELOXICAM 7.5 MG PO TABS
7.5000 mg | ORAL_TABLET | Freq: Every day | ORAL | 0 refills | Status: DC
Start: 2023-03-22 — End: 2024-01-17

## 2023-03-22 NOTE — Progress Notes (Signed)
Established Patient Office Visit     CC/Reason for Visit: Annual preventive exam and discuss acute concerns  HPI: Kendra Gonzalez is a 52 y.o. female who is coming in today for the above mentioned reasons. Past Medical History is significant for: Vitamin D deficiency, impaired glucose tolerance, hypertension, hypothyroidism, history of migraine headaches.  Has routine eye and dental care.  All immunizations are up-to-date with the exception of COVID.  Mammogram and colonoscopy are up-to-date.  Had Pap smear earlier this year with GYN.  She has been complaining of some right wrist pain that corresponds with De Quervains tenosynovitis.   Past Medical/Surgical History: Past Medical History:  Diagnosis Date   Allergy    Arthritis    Asthma    Cataract    bilateral eyes   Endometriosis    GERD (gastroesophageal reflux disease)    Headache    migraines   History of tetanus, diphtheria, and acellular pertussis booster vaccination (Tdap)    07/30/2010   Hyperlipidemia    Kidney stones    Pancreatitis     Past Surgical History:  Procedure Laterality Date   ABDOMINAL HYSTERECTOMY     arthroscopic knee surgery      carpel tunnel     CHOLECYSTECTOMY     ERCP  01/08/2012   Procedure: ENDOSCOPIC RETROGRADE CHOLANGIOPANCREATOGRAPHY (ERCP);  Surgeon: Theda Belfast, MD;  Location: [facility] ENDOSCOPY;  Service: Endoscopy;  Laterality: N/A;   HIP ARTHROPLASTY     left   HIP SURGERY     oophrectomy  2011   secondary to cysts   THYROIDECTOMY N/A 11/21/2020   Procedure: TOTAL THYROIDECTOMY;  Surgeon: Darnell Level, MD;  Location: [facility] ORS;  Service: General;  Laterality: N/A;   TUBAL LIGATION     UPPER GASTROINTESTINAL ENDOSCOPY  2016    Social History:  reports that she has never smoked. She has never used smokeless tobacco. She reports that she does not drink alcohol and does not use drugs.  Allergies: Allergies  Allergen Reactions   Hydromorphone Itching    Family History:   Family History  Problem Relation Age of Onset   Hypertension Father    Hypertension Sister    Hypertension Sister    Heart disease Daughter    Hypertension Maternal Grandfather    Heart disease Maternal Grandfather    Hypertension Paternal Grandfather    Hypertension Brother    Hypertension Brother    Thyroid disease Neg Hx    Colon polyps Neg Hx    Colon cancer Neg Hx    Esophageal cancer Neg Hx    Rectal cancer Neg Hx    Stomach cancer Neg Hx    Breast cancer Neg Hx      Current Outpatient Medications:    acetaminophen (TYLENOL) 500 MG tablet, Take 1,000 mg by mouth every 8 (eight) hours as needed for moderate pain., Disp: , Rfl:    albuterol (PROVENTIL) (2.5 MG/3ML) 0.083% nebulizer solution, Take 3 mLs (2.5 mg total) by nebulization every 6 (six) hours as needed for wheezing or shortness of breath., Disp: 150 mL, Rfl: 1   albuterol (VENTOLIN HFA) 108 (90 Base) MCG/ACT inhaler, Inhale 2 puffs into the lungs every 6 (six) hours as needed for wheezing or shortness of breath., Disp: 18 g, Rfl: 2   azelastine (ASTELIN) 0.1 % nasal spray, Place 1 spray into both nostrils 2 (two) times daily. Use in each nostril as directed, Disp: 30 mL, Rfl: 0   baclofen (LIORESAL) 10  MG tablet, Take 1 tablet (10 mg total) by mouth 2 (two) times daily as needed (migraines)., Disp: 90 each, Rfl: 1   fluticasone-salmeterol (ADVAIR HFA) 115-21 MCG/ACT inhaler, Inhale 2 puffs into the lungs 2 (two) times daily., Disp: 1 each, Rfl: 2   Galcanezumab-gnlm (EMGALITY) 120 MG/ML SOAJ, Inject 120 mg into the skin every 28 (twenty-eight) days., Disp: 1 mL, Rfl: 11   levothyroxine (SYNTHROID) 125 MCG tablet, Take 1 tablet (125 mcg total) by mouth daily., Disp: 90 tablet, Rfl: 3   Magnesium Gluconate 550 MG TABS, TAKE 1 TABLET BY MOUTH AT BEDTIME., Disp: 90 tablet, Rfl: 0   meloxicam (MOBIC) 7.5 MG tablet, Take 1 tablet (7.5 mg total) by mouth daily., Disp: 30 tablet, Rfl: 0   montelukast (SINGULAIR) 10 MG tablet,  Take 1 tablet (10 mg total) by mouth at bedtime., Disp: 30 tablet, Rfl: 0   ondansetron (ZOFRAN) 4 MG tablet, Take 1 tablet (4 mg total) by mouth every 6 (six) hours., Disp: 20 tablet, Rfl: 11   SUMAtriptan (IMITREX) 20 MG/ACT nasal spray, Place 1 spray (20 mg total) into the nose as needed for migraine or headache. May repeat in 2 hours if headache persists or recurs.  Maximum 2 sprays in 24 hours., Disp: 6 each, Rfl: 11   valsartan-hydrochlorothiazide (DIOVAN-HCT) 160-25 MG tablet, Take 1 tablet by mouth daily., Disp: 90 tablet, Rfl: 1   gabapentin (NEURONTIN) 300 MG capsule, Take 1 capsule (300 mg total) by mouth 3 (three) times daily. (Patient not taking: Reported on 01/14/2023), Disp: 90 capsule, Rfl: 0  Review of Systems:  Negative unless indicated in HPI.   Physical Exam: Vitals:   03/22/23 0804  BP: 120/84  Pulse: 71  Temp: 97.7 F (36.5 C)  TempSrc: Oral  SpO2: 99%  Weight: 194 lb 8 oz (88.2 kg)  Height: 5\' 5"  (1.651 m)    Body mass index is 32.37 kg/m.   Physical Exam Vitals reviewed.  Constitutional:      General: She is not in acute distress.    Appearance: Normal appearance. She is not ill-appearing, toxic-appearing or diaphoretic.  HENT:     Head: Normocephalic.     Right Ear: Tympanic membrane, ear canal and external ear normal. There is no impacted cerumen.     Left Ear: Tympanic membrane, ear canal and external ear normal. There is no impacted cerumen.     Nose: Nose normal.     Mouth/Throat:     Mouth: Mucous membranes are moist.     Pharynx: Oropharynx is clear. No oropharyngeal exudate or posterior oropharyngeal erythema.  Eyes:     General: No scleral icterus.       Right eye: No discharge.        Left eye: No discharge.     Conjunctiva/sclera: Conjunctivae normal.     Pupils: Pupils are equal, round, and reactive to light.  Neck:     Vascular: No carotid bruit.  Cardiovascular:     Rate and Rhythm: Normal rate and regular rhythm.     Pulses:  Normal pulses.     Heart sounds: Normal heart sounds.  Pulmonary:     Effort: Pulmonary effort is normal. No respiratory distress.     Breath sounds: Normal breath sounds.  Abdominal:     General: Abdomen is flat. Bowel sounds are normal.     Palpations: Abdomen is soft.  Musculoskeletal:        General: Normal range of motion.     Cervical back:  Normal range of motion.  Skin:    General: Skin is warm and dry.  Neurological:     General: No focal deficit present.     Mental Status: She is alert and oriented to person, place, and time. Mental status is at baseline.  Psychiatric:        Mood and Affect: Mood normal.        Behavior: Behavior normal.        Thought Content: Thought content normal.        Judgment: Judgment normal.     Flowsheet Row Office Visit from 03/22/2023 in [facility]  PHQ-9 Total Score 0        Impression and Plan:  Hypothyroidism, unspecified type -     TSH; Future  IGT (impaired glucose tolerance) -     Hemoglobin A1c; Future  Hyperlipidemia, unspecified hyperlipidemia type  Vitamin D deficiency -     Vitamin B12; Future -     VITAMIN D 25 Hydroxy (Vit-D Deficiency, Fractures); Future  Essential hypertension -     CBC with Differential/Platelet; Future -     Comprehensive metabolic panel; Future -     Lipid panel; Future  Encounter for preventive health examination  Tenosynovitis, de Quervain -     Meloxicam; Take 1 tablet (7.5 mg total) by mouth daily.  Dispense: 30 tablet; Refill: 0   -Recommend routine eye and dental care. -Healthy lifestyle discussed in detail. -Labs to be updated today. -Prostate cancer screening: N/A Health Maintenance  Topic Date Due   COVID-19 Vaccine (4 - 2023-24 season) 12/27/2022   Mammogram  02/11/2024   Colon Cancer Screening  11/09/2029   DTaP/Tdap/Td vaccine (3 - Td or Tdap) 04/18/2031   Flu Shot  Completed   Hepatitis C Screening  Completed   HIV Screening   Completed   Zoster (Shingles) Vaccine  Completed   HPV Vaccine  Aged Out     -Advised to obtain COVID-vaccine at pharmacy. -Obtain GYN records. -Meloxicam daily for 10 days as well as a thumb splint advised for her right de Quervain tenosynovitis.    Chaya Jan, MD [facility]

## 2023-03-23 ENCOUNTER — Other Ambulatory Visit: Payer: Self-pay | Admitting: Internal Medicine

## 2023-03-23 ENCOUNTER — Encounter: Payer: Self-pay | Admitting: Internal Medicine

## 2023-03-23 DIAGNOSIS — E782 Mixed hyperlipidemia: Secondary | ICD-10-CM

## 2023-03-23 DIAGNOSIS — E538 Deficiency of other specified B group vitamins: Secondary | ICD-10-CM | POA: Insufficient documentation

## 2023-03-23 DIAGNOSIS — E559 Vitamin D deficiency, unspecified: Secondary | ICD-10-CM

## 2023-03-23 MED ORDER — ATORVASTATIN CALCIUM 40 MG PO TABS
40.0000 mg | ORAL_TABLET | Freq: Every day | ORAL | 1 refills | Status: DC
Start: 2023-03-23 — End: 2023-12-23

## 2023-03-23 MED ORDER — VITAMIN D (ERGOCALCIFEROL) 1.25 MG (50000 UNIT) PO CAPS: 50000.0000 [IU] | ORAL_CAPSULE | ORAL | 0 refills | Status: AC

## 2023-03-30 ENCOUNTER — Other Ambulatory Visit: Payer: Self-pay | Admitting: *Deleted

## 2023-03-30 DIAGNOSIS — E782 Mixed hyperlipidemia: Secondary | ICD-10-CM

## 2023-03-30 DIAGNOSIS — E559 Vitamin D deficiency, unspecified: Secondary | ICD-10-CM

## 2023-04-02 ENCOUNTER — Ambulatory Visit (INDEPENDENT_AMBULATORY_CARE_PROVIDER_SITE_OTHER): Payer: 59

## 2023-04-02 DIAGNOSIS — E538 Deficiency of other specified B group vitamins: Secondary | ICD-10-CM | POA: Diagnosis not present

## 2023-04-02 MED ORDER — CYANOCOBALAMIN 1000 MCG/ML IJ SOLN
1000.0000 ug | Freq: Once | INTRAMUSCULAR | Status: AC
  Administered 2023-04-02: 1000 ug via INTRAMUSCULAR

## 2023-04-02 NOTE — Progress Notes (Signed)
Per orders of Dr. Michael, injection of Cyanocobalamin 1000 mcg given by Mykal L Good. Patient tolerated injection well.  

## 2023-04-05 ENCOUNTER — Ambulatory Visit: Payer: 59 | Admitting: Physician Assistant

## 2023-04-05 ENCOUNTER — Other Ambulatory Visit (INDEPENDENT_AMBULATORY_CARE_PROVIDER_SITE_OTHER): Payer: 59

## 2023-04-05 ENCOUNTER — Encounter: Payer: Self-pay | Admitting: Physician Assistant

## 2023-04-05 DIAGNOSIS — M25531 Pain in right wrist: Secondary | ICD-10-CM | POA: Diagnosis not present

## 2023-04-05 DIAGNOSIS — M654 Radial styloid tenosynovitis [de Quervain]: Secondary | ICD-10-CM | POA: Diagnosis not present

## 2023-04-05 MED ORDER — METHYLPREDNISOLONE ACETATE 40 MG/ML IJ SUSP
20.0000 mg | INTRAMUSCULAR | Status: AC | PRN
  Administered 2023-04-05: 20 mg

## 2023-04-05 MED ORDER — BUPIVACAINE HCL 0.25 % IJ SOLN
0.5000 mL | INTRAMUSCULAR | Status: AC | PRN
  Administered 2023-04-05: .5 mL

## 2023-04-05 MED ORDER — LIDOCAINE HCL 1 % IJ SOLN
0.5000 mL | INTRAMUSCULAR | Status: AC | PRN
  Administered 2023-04-05: .5 mL

## 2023-04-05 NOTE — Progress Notes (Addendum)
Office Visit Note   Patient: Kendra Gonzalez           Date of Birth: [birth year]           MRN: [id number] Visit Date: 04/05/2023              Requested by: Philip Aspen, Limmie Patricia, MD [address] [place],  [place] [address] PCP: Philip Aspen, Limmie Patricia, MD   Assessment & Plan: Visit Diagnoses:  1. Pain in right wrist   2. De Quervain's syndrome (tenosynovitis)     Plan: Findings consistent with de Quervain's syndrome we discussed the natural history of this she like to go forward with an injection I also recommend continued use of the brace we will follow-up if she does not get improvement   Follow-Up Instructions: No follow-ups on file.   Orders:   No orders of the defined types were placed in this encounter.     Procedures: Hand/UE Inj for de Quervain's tenosynovitis on 04/05/2023 2:21 PM Details: dorsal approach Medications: 0.5 mL lidocaine 1 %; 0.5 mL bupivacaine 0.25 %; 20 mg methylPREDNISolone acetate 40 MG/ML    Clinical Data: No additional findings.   Subjective: Chief Complaint  Patient presents with  . Right Wrist - Pain    HPI pleasant right-hand-dominant woman with a 1 month history of pain along the radial dorsal aspect of her right wrist.  She is right-hand dominant.  Denies any particular injuries.  Denies any paresthesias.  She has tried meloxicam and a brace without much success  Review of Systems  All other systems reviewed and are negative.    Objective: Vital Signs: LMP 06/06/1996 Comment: GYN  Physical Exam Constitutional:      Appearance: Normal appearance.  Pulmonary:     Effort: Pulmonary effort is normal.  Skin:    General: Skin is warm and dry.  Neurological:     General: No focal deficit present.     Mental Status: She is alert and oriented to person, place, and time.  Psychiatric:        Mood and Affect: Mood normal.        Behavior: Behavior normal.    Ortho Exam Examination of her right  wrist shows a strong radial pulse no redness no erythema no swelling.  She is exquisitely tender over the 1 dorsal compartment.  Positive Finkelstein's test Specialty Comments:  No specialty comments available.  Imaging: XR Wrist Complete Right  Result Date: 04/05/2023 Wrist demonstrate well-maintained alignment no significant degenerative changes or fractures    PMFS History: Patient Active Problem List   Diagnosis Date Noted  . De Quervain's syndrome (tenosynovitis) 04/05/2023  . Vitamin B12 deficiency 03/23/2023  . Pain in right hip 01/28/2022  . Hypothyroidism 05/16/2021  . Vitamin D deficiency 04/18/2021  . IGT (impaired glucose tolerance) 04/18/2021  . Hypercalcemia 02/24/2021  . 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 Unilateral primary osteoarthritis, left knee 07/16/2020  . Presence of left artificial hip joint 07/09/2017  . Multinodular goiter 04/17/2015  . History of stomach ulcers 03/22/2015  . Chronic pain of left heel 04/23/2014  . Chronic pain syndrome 03/19/2014  . Asthma 01/04/2013  . BMI 29.0-29.9,adult 10/12/2011  . 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 Hyperlipidemia 02/16/2007  . Allergic rhinitis 02/16/2007  . GERD 02/16/2007   Past Medical History:  Diagnosis Date  . Allergy   . Arthritis   . Asthma   . Cataract    bilateral eyes  . Endometriosis   .  GERD (gastroesophageal reflux disease)   . Headache    migraines  . History of tetanus, diphtheria, and acellular pertussis booster vaccination (Tdap)    07/30/2010  . Hyperlipidemia   . Kidney stones   . Pancreatitis     Family History  Problem Relation Age of Onset  . Hypertension Father   . Hypertension Sister   . Hypertension Sister   . Heart disease Daughter   . Hypertension Maternal Grandfather   . Heart disease Maternal Grandfather   . Hypertension Paternal Grandfather   . Hypertension Brother   . Hypertension Brother   . Thyroid disease Neg Hx   . Colon polyps Neg Hx   . Colon  cancer Neg Hx   . Esophageal cancer Neg Hx   . Rectal cancer Neg Hx   . Stomach cancer Neg Hx   . Breast cancer Neg Hx     Past Surgical History:  Procedure Laterality Date  . ABDOMINAL HYSTERECTOMY    . arthroscopic knee surgery     . carpel tunnel    . CHOLECYSTECTOMY    . ERCP  01/08/2012   Procedure: ENDOSCOPIC RETROGRADE CHOLANGIOPANCREATOGRAPHY (ERCP);  Surgeon: Theda Belfast, MD;  Location: [facility] ENDOSCOPY;  Service: Endoscopy;  Laterality: N/A;  . HIP ARTHROPLASTY     left  . HIP SURGERY    . oophrectomy  2011   secondary to cysts  . THYROIDECTOMY N/A 11/21/2020   Procedure: TOTAL THYROIDECTOMY;  Surgeon: Darnell Level, MD;  Location: [facility] ORS;  Service: General;  Laterality: N/A;  . TUBAL LIGATION    . UPPER GASTROINTESTINAL ENDOSCOPY  2016   Social History   Occupational History  . Occupation: Nursing Aid    Employer: [employer]  Tobacco Use  . Smoking status: Never  . Smokeless tobacco: Never  Vaping Use  . Vaping status: Never Used  Substance and Sexual Activity  . Alcohol use: No  . Drug use: No  . Sexual activity: Yes    Birth control/protection: None

## 2023-04-08 ENCOUNTER — Emergency Department (HOSPITAL_COMMUNITY)
Admission: EM | Admit: 2023-04-08 | Discharge: 2023-04-08 | Disposition: A | Discharge status: Home / Self Care | Payer: 59 | Attending: Emergency Medicine | Admitting: Emergency Medicine

## 2023-04-08 ENCOUNTER — Other Ambulatory Visit: Payer: Self-pay

## 2023-04-08 ENCOUNTER — Encounter (HOSPITAL_COMMUNITY): Payer: Self-pay

## 2023-04-08 DIAGNOSIS — X58XXXA Exposure to other specified factors, initial encounter: Secondary | ICD-10-CM | POA: Insufficient documentation

## 2023-04-08 DIAGNOSIS — S0502XA Injury of conjunctiva and corneal abrasion without foreign body, left eye, initial encounter: Secondary | ICD-10-CM | POA: Insufficient documentation

## 2023-04-08 MED ORDER — OFLOXACIN 0.3 % OP SOLN
1.0000 [drp] | OPHTHALMIC | Status: DC
  Administered 2023-04-08: 1 [drp] via OPHTHALMIC
  Filled 2023-04-08: qty 5

## 2023-04-08 NOTE — Discharge Instructions (Addendum)
Schedule follow-up with your eye doctor for as soon as possible.  Do not wear contacts until follow-up.  Return to the emergency room if symptoms worsen.

## 2023-04-08 NOTE — ED Triage Notes (Signed)
Pt reports feeling like there is something in her left eye.

## 2023-04-08 NOTE — ED Provider Notes (Signed)
[facility]ENC[facility]PARTMENT AT [facility] Provider Note   CSN: [id number] Arrival date & time: 04/08/23  0356     History  Chief Complaint  Patient presents with   Eye Problem    Kendra Gonzalez is a 52 y.o. female.  Presents with plaints of left eye irritation.  She is a contact wearer.  She feels like something scratched her eye.       Home Medications Prior to Admission medications   Medication Sig Start Date End Date Taking? Authorizing Provider  acetaminophen (TYLENOL) 500 MG tablet Take 1,000 mg by mouth every 8 (eight) hours as needed for moderate pain.    [provider]  albuterol (PROVENTIL) (2.5 MG/3ML) 0.083% nebulizer solution Take 3 mLs (2.5 mg total) by nebulization every 6 (six) hours as needed for wheezing or shortness of breath. 03/19/21   Philip Aspen, Limmie Patricia, MD  albuterol (VENTOLIN HFA) 108 (90 Base) MCG/ACT inhaler Inhale 2 puffs into the lungs every 6 (six) hours as needed for wheezing or shortness of breath. 03/19/21   Philip Aspen, Limmie Patricia, MD  atorvastatin (LIPITOR) 40 MG tablet Take 1 tablet (40 mg total) by mouth daily. 03/23/23   Philip Aspen, Limmie Patricia, MD  azelastine (ASTELIN) 0.1 % nasal spray Place 1 spray into both nostrils 2 (two) times daily. Use in each nostril as directed 09/04/22   Crain, Whitney L, PA  baclofen (LIORESAL) 10 MG tablet Take 1 tablet (10 mg total) by mouth 2 (two) times daily as needed (migraines). 01/13/22   Willow Ora, MD  fluticasone-salmeterol (ADVAIR HFA) [phone] MCG/ACT inhaler Inhale 2 puffs into the lungs 2 (two) times daily. 03/19/21   Philip Aspen, Limmie Patricia, MD  gabapentin (NEURONTIN) 300 MG capsule Take 1 capsule (300 mg total) by mouth 3 (three) times daily. Patient not taking: Reported on 01/14/2023 01/13/22 01/14/23  Willow Ora, MD  Galcanezumab-gnlm [facility]) 120 MG/ML SOAJ Inject 120 mg into the skin every 28 (twenty-eight) days. 01/14/23   Drema Dallas,  DO  levothyroxine (SYNTHROID) 125 MCG tablet Take 1 tablet (125 mcg total) by mouth daily. 09/09/22   Shamleffer, Konrad Dolores, MD  Magnesium Gluconate 550 MG TABS TAKE 1 TABLET BY MOUTH AT BEDTIME. 02/11/22   Philip Aspen, Limmie Patricia, MD  meloxicam (MOBIC) 7.5 MG tablet Take 1 tablet (7.5 mg total) by mouth daily. 03/22/23   Philip Aspen, Limmie Patricia, MD  montelukast (SINGULAIR) 10 MG tablet Take 1 tablet (10 mg total) by mouth at bedtime. 09/04/22   Crain, Whitney L, PA  ondansetron (ZOFRAN) 4 MG tablet Take 1 tablet (4 mg total) by mouth every 6 (six) hours. 01/14/23   Drema Dallas, DO  SUMAtriptan (IMITREX) 20 MG/ACT nasal spray Place 1 spray (20 mg total) into the nose as needed for migraine or headache. May repeat in 2 hours if headache persists or recurs.  Maximum 2 sprays in 24 hours. 01/14/23   Everlena Cooper, Adam R, DO  valsartan-hydrochlorothiazide (DIOVAN-HCT) 160-25 MG tablet Take 1 tablet by mouth daily. 06/24/21   Philip Aspen, Limmie Patricia, MD  Vitamin D, Ergocalciferol, (DRISDOL) 1.25 MG (50000 UNIT) CAPS capsule Take 1 capsule (50,000 Units total) by mouth every 7 (seven) days for 12 doses. 03/23/23 06/09/23  Philip Aspen, Limmie Patricia, MD  methimazole (TAPAZOLE) 10 MG tablet Take 1 tablet (10 mg total) by mouth 2 (two) times daily. Patient not taking: Reported on 10/29/2020 08/06/20 11/21/20  Romero Belling, MD  Allergies    Hydromorphone    Review of Systems   Review of Systems  Physical Exam Updated Vital Signs BP (!) 142/108   Pulse 83   Temp 98 F (36.7 C) (Oral)   Resp 15   LMP 06/06/1996 Comment: GYN  SpO2 100%  Physical Exam Constitutional:      Appearance: Normal appearance.  HENT:     Head: Normocephalic and atraumatic.  Eyes:     General: Lids are normal. Lids are everted, no foreign bodies appreciated. Vision grossly intact. Gaze aligned appropriately.     Extraocular Movements: Extraocular movements intact.     Pupils: Pupils are equal, round, and reactive  to light.     Left eye: Fluorescein uptake (multiple punctate areas of uptake along lower portion of cornea) present.  Neurological:     Mental Status: She is alert.     ED Results / Procedures / Treatments   Labs (all labs ordered are listed, but only abnormal results are displayed) Labs Reviewed - No data to display  EKG None  Radiology No results found.  Procedures Procedures    Medications Ordered in ED Medications  ofloxacin (OCUFLOX) 0.3 % ophthalmic solution 1 drop (has no administration in time range)    ED Course/ Medical Decision Making/ A&P                                 Medical Decision Making  Patient with area of fluorescein uptake on the left cornea and SPK type pattern.  No dendritic lesions.  No ulceration.  Will treat with antibiotic drops and patient is to promptly follow-up with eye doctor for recheck.        Final Clinical Impression(s) / ED Diagnoses Final diagnoses:  Abrasion of left cornea, initial encounter    Rx / DC Orders ED Discharge Orders     None         Bricelyn Freestone, Canary Brim, MD 04/08/23 [phone]

## 2023-04-09 ENCOUNTER — Ambulatory Visit (INDEPENDENT_AMBULATORY_CARE_PROVIDER_SITE_OTHER): Payer: 59

## 2023-04-09 DIAGNOSIS — E538 Deficiency of other specified B group vitamins: Secondary | ICD-10-CM | POA: Diagnosis not present

## 2023-04-09 MED ORDER — CYANOCOBALAMIN 1000 MCG/ML IJ SOLN
1000.0000 ug | Freq: Once | INTRAMUSCULAR | Status: AC
  Administered 2023-04-09: 1000 ug via INTRAMUSCULAR

## 2023-04-09 NOTE — Progress Notes (Signed)
Per orders of Dr. Caryl Never, injection of B12 given by Vickii Chafe on Left Deltoid. Patient tolerated injection well.

## 2023-04-16 ENCOUNTER — Ambulatory Visit (INDEPENDENT_AMBULATORY_CARE_PROVIDER_SITE_OTHER): Payer: 59

## 2023-04-16 DIAGNOSIS — E538 Deficiency of other specified B group vitamins: Secondary | ICD-10-CM | POA: Diagnosis not present

## 2023-04-16 MED ORDER — CYANOCOBALAMIN 1000 MCG/ML IJ SOLN
1000.0000 ug | Freq: Once | INTRAMUSCULAR | Status: AC
  Administered 2023-04-16: 1000 ug via INTRAMUSCULAR

## 2023-04-16 NOTE — Progress Notes (Signed)
Per orders of Dr. Caryl Never, injection of B12 given by Vickii Chafe on Left Deltoid. Patient tolerated injection well.

## 2023-04-23 ENCOUNTER — Other Ambulatory Visit: Payer: Self-pay

## 2023-04-23 ENCOUNTER — Ambulatory Visit (INDEPENDENT_AMBULATORY_CARE_PROVIDER_SITE_OTHER): Payer: 59

## 2023-04-23 DIAGNOSIS — E538 Deficiency of other specified B group vitamins: Secondary | ICD-10-CM | POA: Diagnosis not present

## 2023-04-23 MED ORDER — CYANOCOBALAMIN 1000 MCG/ML IJ SOLN
1000.0000 ug | Freq: Once | INTRAMUSCULAR | Status: AC
  Administered 2023-04-23: 1000 ug via INTRAMUSCULAR

## 2023-04-23 NOTE — Telephone Encounter (Signed)
Pt came in for B12 injection. Pt requesting to give self injection at home.   Is it okay to give self injection at home after education on self-injection?  Please advise.

## 2023-04-23 NOTE — Progress Notes (Signed)
Per orders of Shirline Frees, NP , injection of B12  given by Vickii Chafe on Right Deltoid . Patient tolerated injection well.

## 2023-04-29 ENCOUNTER — Encounter: Payer: Self-pay | Admitting: Internal Medicine

## 2023-04-30 NOTE — Addendum Note (Signed)
 Addended by: Vickii Chafe on: 04/30/2023 09:05 AM   Modules accepted: Orders

## 2023-04-30 NOTE — Telephone Encounter (Signed)
Attempted to reach pt.  Voicemail is full.

## 2023-05-05 NOTE — Telephone Encounter (Signed)
Attempted to reach pt.  Mailbox is full.

## 2023-05-19 ENCOUNTER — Telehealth: Payer: Self-pay

## 2023-05-19 MED ORDER — CYANOCOBALAMIN 1000 MCG/ML IJ SOLN: INTRAMUSCULAR | 11 refills | Status: DC

## 2023-05-19 MED ORDER — CYANOCOBALAMIN 1000 MCG/ML IJ SOLN: INTRAMUSCULAR | 11 refills | Status: AC

## 2023-05-19 MED ORDER — BD SAFETYGLIDE SYRINGE/NEEDLE 25G X 1" 3 ML MISC: 3 refills | Status: AC

## 2023-05-19 NOTE — Telephone Encounter (Signed)
Attempted to reach pt about her B12 injection supply that it was sent.   Voicemail is full.

## 2023-05-19 NOTE — Addendum Note (Signed)
Addended by: Vickii Chafe on: 05/19/2023 08:59 AM   Modules accepted: Orders

## 2023-05-25 ENCOUNTER — Ambulatory Visit: Payer: 59 | Admitting: Physician Assistant

## 2023-05-25 ENCOUNTER — Encounter: Payer: Self-pay | Admitting: Physician Assistant

## 2023-05-25 DIAGNOSIS — M79644 Pain in right finger(s): Secondary | ICD-10-CM

## 2023-05-25 DIAGNOSIS — G8929 Other chronic pain: Secondary | ICD-10-CM | POA: Diagnosis not present

## 2023-05-25 MED ORDER — METHYLPREDNISOLONE 4 MG PO TBPK: ORAL_TABLET | ORAL | 0 refills | Status: DC

## 2023-05-25 NOTE — Progress Notes (Signed)
Office Visit Note   Patient: Kendra Gonzalez           Date of Birth: [birth year]           MRN: [id number] Visit Date: 05/25/2023              Requested by: Philip Aspen, Limmie Patricia, MD [address] [place],  [place] [address] PCP: Philip Aspen, Limmie Patricia, MD  Chief Complaint  Patient presents with   Right Wrist - Pain      HPI: Patient presents today with continued problems with de Quervain's of her right wrist.  This is now been going on several months.  I did do a first dorsal compartment injection she did not think she got any relief from this.  She is wearing a small unsupportive brace she is wondering if she can get something a Ehle bit more supportive  Assessment & Plan: Visit Diagnoses: Tommi Rumps Quervain's syndrome right wrist  Plan: She was not helped with an injection.  Will try her on an oral Medrol Dosepak.  Will also provide her with a thumb spica splint.  Would like her to follow-up with Dr. Fara Boros  Follow-Up Instructions: No follow-ups on file.   Ortho Exam  Patient is alert, oriented, no adenopathy, well-dressed, normal affect, normal respiratory effort. Right wrist no swelling no erythema she is neurovascularly intact no swelling.  She is tender over the first dorsal compartment positive Finkelstein's test  Imaging: No results found. No images are attached to the encounter.  Labs: Lab Results  Component Value Date   HGBA1C 5.8 03/22/2023   HGBA1C 6.1 11/05/2021   HGBA1C 5.9 04/17/2021   ESRSEDRATE 14 03/15/2022   REPTSTATUS 11/05/2018 FINAL 11/03/2018   CULT (A) 11/03/2018    10,000 COLONIES/mL MULTIPLE SPECIES PRESENT, SUGGEST RECOLLECTION   LABORGA Multiple bacterial morphotypes present, none 10/31/2014   LABORGA predominant. Suggest appropriate recollection if 10/31/2014   LABORGA clinically indicated. 10/31/2014     Lab Results  Component Value Date   ALBUMIN 4.5 03/22/2023   ALBUMIN 4.2 03/15/2022   ALBUMIN 4.5 06/24/2021     Lab Results  Component Value Date   MG 2.1 01/08/2012   Lab Results  Component Value Date   VD25OH 19.06 (L) 03/22/2023   VD25OH 44.60 06/24/2021   VD25OH 29.48 (L) 05/16/2021    No results found for: "PREALBUMIN"    Latest Ref Rng & Units 03/22/2023    8:36 AM 03/15/2022    2:42 PM 11/05/2021    9:15 AM  CBC EXTENDED  WBC 4.0 - 10.5 K/uL 5.4  6.4  7.8   RBC 3.87 - 5.11 Mil/uL 4.89  5.01  4.50   Hemoglobin 12.0 - 15.0 g/dL 21.3  08.6  57.8   HCT 36.0 - 46.0 % 43.3  43.6  40.0   Platelets 150.0 - 400.0 K/uL 279.0  307  330.0   NEUT# 1.4 - 7.7 K/uL 2.6  3.7  4.7   Lymph# 0.7 - 4.0 K/uL 2.2  2.2  2.5      There is no height or weight on file to calculate BMI.  Orders:  No orders of the defined types were placed in this encounter.  Meds ordered this encounter  Medications   methylPREDNISolone (MEDROL DOSEPAK) 4 MG TBPK tablet    Sig: Take as directed with food    Dispense:  21 tablet    Refill:  0     Procedures: No procedures performed  Clinical Data: No  additional findings.  ROS:  All other systems negative, except as noted in the HPI. Review of Systems  Objective: Vital Signs: LMP 06/06/1996 Comment: GYN  Specialty Comments:  No specialty comments available.  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   Unilateral primary osteoarthritis, left knee 07/16/2020   Presence of left artificial hip joint 07/09/2017   Multinodular goiter 04/17/2015   History of stomach ulcers 03/22/2015   Chronic pain of left heel 04/23/2014   Chronic pain syndrome 03/19/2014   Asthma 01/04/2013   BMI 29.0-29.9,adult 10/12/2011   Paroxysmal nerve pain  01/22/2011   Hyperlipidemia 02/16/2007   Allergic rhinitis 02/16/2007   GERD 02/16/2007   Past Medical History:  Diagnosis Date   Allergy    Arthritis    Asthma    Cataract    bilateral eyes   Endometriosis    GERD (gastroesophageal reflux disease)    Headache    migraines   History of tetanus, diphtheria, and acellular pertussis booster vaccination (Tdap)    07/30/2010   Hyperlipidemia    Kidney stones    Pancreatitis     Family History  Problem Relation Age of Onset   Hypertension Father    Hypertension Sister    Hypertension Sister    Heart disease Daughter    Hypertension Maternal Grandfather    Heart disease Maternal Grandfather    Hypertension Paternal Grandfather    Hypertension Brother    Hypertension Brother    Thyroid disease Neg Hx    Colon polyps Neg Hx    Colon cancer Neg Hx    Esophageal cancer Neg Hx    Rectal cancer Neg Hx    Stomach cancer Neg Hx    Breast cancer Neg Hx     Past Surgical History:  Procedure Laterality Date   ABDOMINAL HYSTERECTOMY     arthroscopic knee surgery      carpel tunnel     CHOLECYSTECTOMY     ERCP  01/08/2012   Procedure: ENDOSCOPIC RETROGRADE CHOLANGIOPANCREATOGRAPHY (ERCP);  Surgeon: Theda Belfast, MD;  Location: [facility] ENDOSCOPY;  Service: Endoscopy;  Laterality: N/A;   HIP ARTHROPLASTY     left   HIP SURGERY     oophrectomy  2011   secondary to cysts   THYROIDECTOMY N/A 11/21/2020   Procedure: TOTAL THYROIDECTOMY;  Surgeon: Darnell Level, MD;  Location: [facility] ORS;  Service: General;  Laterality: N/A;   TUBAL LIGATION     UPPER GASTROINTESTINAL ENDOSCOPY  2016   Social History   Occupational History   Occupation: Nursing Aid    Employer: [employer]  Tobacco Use   Smoking status: Never   Smokeless tobacco: Never  Vaping Use   Vaping status: Never Used  Substance and Sexual Activity   Alcohol use: No   Drug use: No   Sexual activity: Yes    Birth control/protection: None

## 2023-06-01 ENCOUNTER — Ambulatory Visit (INDEPENDENT_AMBULATORY_CARE_PROVIDER_SITE_OTHER): Payer: 59 | Admitting: Orthopedic Surgery

## 2023-06-01 DIAGNOSIS — M654 Radial styloid tenosynovitis [de Quervain]: Secondary | ICD-10-CM | POA: Diagnosis not present

## 2023-06-01 NOTE — Progress Notes (Signed)
 Kendra Gonzalez - 53 y.o. female MRN [id number]  Date of birth: Jul 26, 1970  Office Visit Note: Visit Date: 06/01/2023 PCP: Theophilus Andrews, Tully GRADE, MD Referred by: Persons, Ronal Dragon, PA  Subjective: No chief complaint on file.  HPI: Kendra Gonzalez is a pleasant 53 y.o. female who presents today for evaluation of ongoing right wrist de Quervain's tenosynovitis that has been present now for multiple months, worsening in nature.  She has been seen previously by Ronal Dragon Persons, PA in our group who referred her to me for hand surgical evaluation given her symptoms refractory to conservative care.  She has trialed bracing and previous injection without lasting relief.  Her pain is isolated to the radial aspect of the right wrist with radiation into the thumb and into the proximal forearm.  Denies any numbness or tingling.  She is right-hand dominant and overall active and healthy at baseline.  Pertinent ROS were reviewed with the patient and found to be negative unless otherwise specified above in HPI.   Visit Reason: right wrist de Quervain's tenosynovitis Duration of symptoms: November 2024 Hand dominance: right Occupation: CNA Diabetic: No Smoking: No Heart/Lung History:none Blood Thinners:  none  Prior Testing/EMG: 04/05/23 Injections (Date): 04/05/23 Treatments: brace, injection Prior Surgery: none  Assessment & Plan: Visit Diagnoses: No diagnosis found.  Plan: Extensive discussion was had with the patient today regarding her right wrist pain.  Her clinical examination is consistent with right wrist first extensor compartment tenosynovitis.  She has undergone appropriate workup and treatment from a conservative standpoint to date, her symptoms remain refractory to appropriate conservative care.  We discussed the underlying etiology and pathophysiology of de Quervain's tenosynovitis.  We discussed other conservative treatment options in the form of repeat  injection, therapy, ongoing bracing and further activity modification as well as anti-inflammatory medication.  At this juncture, given her significance of symptoms she is interested in more aggressive treatment and would like to discuss surgical measures.  I am in agreement with this plan given her severity of symptoms on examination today.  We discussed risks and benefits of the procedure as well as various forms of anesthesia.  Risks and benefits of the procedure were discussed, risks including but not limited to infection, bleeding, scarring, stiffness, nerve injury, tendon injury, vascular injury, tendon instability, recurrence of symptoms and need for subsequent operation.  Patient expressed understanding.    She is indicated for right wrist first extensor compartment release under IV sedation.  Will move forward with surgical scheduling at the next available date per her preference.  Follow-up: No follow-ups on file.   Meds & Orders: No orders of the defined types were placed in this encounter.  No orders of the defined types were placed in this encounter.    Procedures: No procedures performed      Clinical History: No specialty comments available.  She reports that she has never smoked. She has never used smokeless tobacco.  Recent Labs    03/22/23 0836  HGBA1C 5.8    Objective:   Vital Signs: LMP 06/06/1996 Comment: GYN  Physical Exam  Gen: Well-appearing, in no acute distress; non-toxic CV: Regular Rate. Well-perfused. Warm.  Resp: Breathing unlabored on room air; no wheezing. Psych: Fluid speech in conversation; appropriate affect; normal thought process  Ortho Exam General: Patient is well appearing and in no distress. Cervical spine mobility is full in all directions:  Skin and Muscle: No skin changes are apparent to upper extremities.  Muscle bulk  and contour normal, no signs of atrophy.     Range of Motion and Palpation Tests: Mobility is full about the  elbows with flexion and extension.  Forearm supination and pronation are 85/85 bilaterally.  Wrist flexion/extension is 75/65 left side, wrist flexion/extension limited secondary to pain right side, approximately 45/45.  Digital flexion and extension are full.    No cords or nodules are palpated.  No triggering is observed.   Finklestein test is positive right side, significant pain with associated swelling and tenderness.    Neurologic, Vascular, Motor: Sensation is intact to light touch in the median/radial/ulnar distributions.   Fingers pink and well perfused.  Capillary refill is brisk.     Imaging: No results found.  Past Medical/Family/Surgical/Social History: Medications & Allergies reviewed per EMR, new medications updated. 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   Unilateral primary osteoarthritis, left knee 07/16/2020   Presence of left artificial hip joint 07/09/2017   Multinodular goiter 04/17/2015   History of stomach ulcers 03/22/2015   Chronic pain of left heel 04/23/2014   Chronic pain syndrome 03/19/2014   Asthma 01/04/2013   BMI 29.0-29.9,adult 10/12/2011   Paroxysmal nerve pain 01/22/2011   Hyperlipidemia 02/16/2007   Allergic rhinitis 02/16/2007   GERD 02/16/2007   Past Medical History:  Diagnosis Date   Allergy    Arthritis    Asthma    Cataract    bilateral eyes   Endometriosis    GERD (gastroesophageal reflux disease)    Headache    migraines   History of tetanus, diphtheria, and acellular pertussis booster vaccination (Tdap)    07/30/2010   Hyperlipidemia    Kidney stones    Pancreatitis    Family History  Problem Relation Age of Onset    Hypertension Father    Hypertension Sister    Hypertension Sister    Heart disease Daughter    Hypertension Maternal Grandfather    Heart disease Maternal Grandfather    Hypertension Paternal Grandfather    Hypertension Brother    Hypertension Brother    Thyroid  disease Neg Hx    Colon polyps Neg Hx    Colon cancer Neg Hx    Esophageal cancer Neg Hx    Rectal cancer Neg Hx    Stomach cancer Neg Hx    Breast cancer Neg Hx    Past Surgical History:  Procedure Laterality Date   ABDOMINAL HYSTERECTOMY     arthroscopic knee surgery      carpel tunnel     CHOLECYSTECTOMY     ERCP  01/08/2012   Procedure: ENDOSCOPIC RETROGRADE CHOLANGIOPANCREATOGRAPHY (ERCP);  Surgeon: Belvie JONETTA Just, MD;  Location[facility] ENDOSCOPY;  Service: Endoscopy;  Laterality: N/A;   HIP ARTHROPLASTY     left   HIP SURGERY     oophrectomy  2011   secondary to cysts   THYROIDECTOMY N/A 11/21/2020   Procedure: TOTAL THYROIDECTOMY;  Surgeon: Eletha Boas, MD;  Location: [facility] ORS;  Service: General;  Laterality: N/A;   TUBAL LIGATION     UPPER GASTROINTESTINAL ENDOSCOPY  2016   Social History   Occupational History   Occupation: Nursing Aid    Employer: [employer]  Tobacco Use   Smoking  status: Never   Smokeless tobacco: Never  Vaping Use   Vaping status: Never Used  Substance and Sexual Activity   Alcohol use: No   Drug use: No   Sexual activity: Yes    Birth control/protection: None    Rubie Ficco Estela) Arlinda, M.D. [facility] 9:00 PM

## 2023-06-28 ENCOUNTER — Other Ambulatory Visit (INDEPENDENT_AMBULATORY_CARE_PROVIDER_SITE_OTHER): Payer: 59

## 2023-06-28 DIAGNOSIS — E559 Vitamin D deficiency, unspecified: Secondary | ICD-10-CM | POA: Diagnosis not present

## 2023-06-28 DIAGNOSIS — E782 Mixed hyperlipidemia: Secondary | ICD-10-CM | POA: Diagnosis not present

## 2023-06-28 LAB — LIPID PANEL
Cholesterol: 214 mg/dL — ABNORMAL HIGH (ref 0–200)
HDL: 62.6 mg/dL (ref >39.00)
LDL Cholesterol: 141 mg/dL — ABNORMAL HIGH (ref 0–99)
NonHDL: 151.89
Total CHOL/HDL Ratio: 3
Triglycerides: 53 mg/dL (ref 0.0–149.0)
VLDL: 10.6 mg/dL (ref 0.0–40.0)

## 2023-06-28 LAB — VITAMIN D 25 HYDROXY (VIT D DEFICIENCY, FRACTURES): VITD: 18.18 ng/mL — ABNORMAL LOW (ref 30.00–100.00)

## 2023-07-06 ENCOUNTER — Other Ambulatory Visit: Payer: Self-pay | Admitting: Internal Medicine

## 2023-07-06 ENCOUNTER — Encounter: Payer: Self-pay | Admitting: Internal Medicine

## 2023-07-06 DIAGNOSIS — E559 Vitamin D deficiency, unspecified: Secondary | ICD-10-CM

## 2023-07-06 MED ORDER — VITAMIN D (ERGOCALCIFEROL) 1.25 MG (50000 UNIT) PO CAPS: 50000.0000 [IU] | ORAL_CAPSULE | ORAL | 0 refills | Status: AC

## 2023-07-22 ENCOUNTER — Other Ambulatory Visit: Payer: Self-pay | Admitting: Orthopedic Surgery

## 2023-07-22 DIAGNOSIS — G5601 Carpal tunnel syndrome, right upper limb: Secondary | ICD-10-CM | POA: Diagnosis not present

## 2023-07-22 MED ORDER — ACETAMINOPHEN-CODEINE 300-30 MG PO TABS: 1.0000 | ORAL_TABLET | Freq: Four times a day (QID) | ORAL | 0 refills | Status: AC | PRN

## 2023-07-27 ENCOUNTER — Telehealth: Payer: Self-pay | Admitting: Orthopedic Surgery

## 2023-07-27 ENCOUNTER — Other Ambulatory Visit: Payer: Self-pay | Admitting: Orthopedic Surgery

## 2023-07-27 ENCOUNTER — Encounter: Payer: Self-pay | Admitting: Orthopedic Surgery

## 2023-07-27 NOTE — Telephone Encounter (Signed)
Speaking with patient via mychart

## 2023-07-27 NOTE — Telephone Encounter (Signed)
 Pt is requesting a note to return back to work with light restrictions. Pt will come in office to pick letter up. Pt stated she feels great after surgery, no pain, barely taking meds she feels secured to go back to work before her po visit.

## 2023-07-28 ENCOUNTER — Other Ambulatory Visit: Payer: Self-pay | Admitting: Orthopedic Surgery

## 2023-07-28 DIAGNOSIS — M654 Radial styloid tenosynovitis [de Quervain]: Secondary | ICD-10-CM

## 2023-07-30 ENCOUNTER — Encounter: Payer: Self-pay | Admitting: Orthopedic Surgery

## 2023-08-04 NOTE — Therapy (Signed)
 OUTPATIENT OCCUPATIONAL THERAPY ORTHO EVALUATION  Patient Name: Kendra Gonzalez MRN: [id number] DOB:1970/08/16, 53 y.o., female Today's Date: 08/04/2023  PCP: Kathrene Bongo MD REFERRING PROVIDER:  Samuella Cota, MD    END OF SESSION:   Past Medical History:  Diagnosis Date   Allergy    Arthritis    Asthma    Cataract    bilateral eyes   Endometriosis    GERD (gastroesophageal reflux disease)    Headache    migraines   History of tetanus, diphtheria, and acellular pertussis booster vaccination (Tdap)    07/30/2010   Hyperlipidemia    Kidney stones    Pancreatitis    Past Surgical History:  Procedure Laterality Date   ABDOMINAL HYSTERECTOMY     arthroscopic knee surgery      carpel tunnel     CHOLECYSTECTOMY     ERCP  01/08/2012   Procedure: ENDOSCOPIC RETROGRADE CHOLANGIOPANCREATOGRAPHY (ERCP);  Surgeon: Theda Belfast, MD;  Location: [facility] ENDOSCOPY;  Service: Endoscopy;  Laterality: N/A;   HIP ARTHROPLASTY     left   HIP SURGERY     oophrectomy  2011   secondary to cysts   THYROIDECTOMY N/A 11/21/2020   Procedure: TOTAL THYROIDECTOMY;  Surgeon: Darnell Level, MD;  Location: [facility] ORS;  Service: General;  Laterality: N/A;   TUBAL LIGATION     UPPER GASTROINTESTINAL ENDOSCOPY  2016   Patient Active Problem List   Diagnosis Date Noted   De Quervain's syndrome (tenosynovitis) 04/05/2023   Vitamin B12 deficiency 03/23/2023   Pain in right hip 01/28/2022   Hypothyroidism 05/16/2021   Vitamin D deficiency 04/18/2021   IGT (impaired glucose tolerance) 04/18/2021   Hypercalcemia 02/24/2021   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   Unilateral primary osteoarthritis, left knee 07/16/2020   Presence of left artificial hip joint 07/09/2017   Multinodular goiter 04/17/2015   History of stomach ulcers 03/22/2015   Chronic pain of left heel 04/23/2014   Chronic pain syndrome 03/19/2014   Asthma 01/04/2013   BMI 29.0-29.9,adult 10/12/2011   Paroxysmal nerve pain  01/22/2011   Hyperlipidemia 02/16/2007   Allergic rhinitis 02/16/2007   GERD 02/16/2007    ONSET DATE: DOS 07/22/23  REFERRING DIAG: M65.4 (ICD-10-CM) - Tommi Rumps Quervain's syndrome (tenosynovitis)   THERAPY DIAG:  No diagnosis found.  Rationale for Evaluation and Treatment: Rehabilitation  SUBJECTIVE:   SUBJECTIVE STATEMENT: 2 weeks S/p Rt D.Q.R.  She states ***.      PERTINENT HISTORY: ***  PRECAUTIONS: {Therapy precautions:24002}  RED FLAGS: {PT Red Flags:29287}   WEIGHT BEARING RESTRICTIONS: {Yes ***/No:24003}  PAIN:  Are you having pain? Yes: NPRS scale: *** Pain location: *** Pain description: *** Aggravating factors: *** Relieving factors: ***  FALLS: Has patient fallen in last 6 months? {fallsyesno:27318}  LIVING ENVIRONMENT: Lives with: {OPRC lives with:25569::"lives with their family"} Lives in: {Lives in:25570} Stairs: {opstairs:27293} Has following equipment at home: {Assistive devices:23999}  PLOF: {PLOF:24004}  PATIENT GOALS: ***  NEXT MD VISIT: ***  OBJECTIVE: (All objective assessments below are from initial evaluation on: *** unless otherwise specified.)    HAND DOMINANCE: Right ***  ADLs: Overall ADLs: States decreased ability to grab, hold household objects, pain and difficulty to open containers, perform FMS tasks (manipulate fasteners on clothing), mild to moderate bathing problems as well. ***   FUNCTIONAL OUTCOME MEASURES: Eval: Quick DASH ***% impairment today  (Higher % Score  =  More Impairment)    Patient Specific Functional Scale: *** (***, ***, ***)  (Higher Score  =  Better Ability for the Selected Tasks)     Patient Rated Wrist Evaluation (PRWE): Pain: ***/50; Function: ***/50; Total Score: ***/100 (Higher Score  =  More Pain and/or Debility)    UPPER EXTREMITY ROM     Shoulder to Wrist AROM Right eval Left eval  Shoulder flexion    Shoulder abduction    Shoulder extension    Shoulder internal rotation    Shoulder  external rotation    Elbow flexion    Elbow extension    Forearm supination    Forearm pronation     Wrist flexion    Wrist extension    Wrist ulnar deviation    Wrist radial deviation    Functional dart thrower's motion (F-DTM) in ulnar flexion    F-DTM in radial extension     (Blank rows = not tested)   Hand AROM Right eval Left eval  Full Fist Ability (or Gap to Distal Palmar Crease)    Thumb Opposition  (Kapandji Scale)     Thumb MCP (0-60)    Thumb IP (0-80)    Thumb Radial Abduction Span     Thumb Palmar Abduction Span     Index MCP (0-90)     Index PIP (0-100)     Index DIP (0-70)      Long MCP (0-90)      Long PIP (0-100)      Long DIP (0-70)      Ring MCP (0-90)      Ring PIP (0-100)      Ring DIP (0-70)      Devargas MCP (0-90)      Haston PIP (0-100)      Madonna DIP (0-70)      (Blank rows = not tested)   UPPER EXTREMITY MMT:    Eval: *** NT at eval due to recent and still healing injuries. Will be tested when appropriate.   MMT Right TBD Left TBD  Shoulder flexion    Shoulder abduction    Shoulder adduction    Shoulder extension    Shoulder internal rotation    Shoulder external rotation    Middle trapezius    Lower trapezius    Elbow flexion    Elbow extension    Forearm supination    Forearm pronation    Wrist flexion    Wrist extension    Wrist ulnar deviation    Wrist radial deviation    (Blank rows = not tested)  HAND FUNCTION: Eval: Observed weakness in affected *** hand.  Grip strength Right: *** lbs, Left: *** lbs   COORDINATION: Eval: Observed coordination impairments with affected *** hand. Box and Blocks Test: *** Blocks today (*** is [facility]); 9 Hole Peg Test Right: ***sec, Left: *** sec (*** sec is WFL)   SENSATION: Eval: *** Light touch intact today, though diminished around sx area    EDEMA:   Eval: *** Mildly swollen in *** hand and wrist today, ***cm circumferentially around ***  COGNITION: Eval: Overall cognitive  status: WFL for evaluation today ***  OBSERVATIONS:   Eval: ***   TODAY'S TREATMENT:  Post-evaluation treatment: ***   Modalities: {OPRCMODALITIES:31717}  PATIENT EDUCATION: Education details: See tx section above for details  Person educated: Patient Education method: Engineer, structural, Teach back, Handouts  Education comprehension: States and demonstrates understanding, Additional Education required    HOME EXERCISE PROGRAM: See tx section above for details    GOALS: Goals reviewed with patient? Yes   SHORT TERM GOALS: (STG  required if POC>30 days) Target Date: ***  Pt will obtain protective, custom orthotic. Goal status: TBD/PRN,  MET ***  2.  Pt will demo/state understanding of initial HEP to improve pain levels and prerequisite motion. Goal status: INITIAL   LONG TERM GOALS: Target Date: 09/17/23  Pt will improve functional ability by decreased impairment per Quick DASH / PSFS / PRWE assessment from *** to *** or better, for better quality of life. Goal status: INITIAL  2.  Pt will improve grip strength in *** hand from ***lbs to at least ***lbs for functional use at home and in IADLs. Goal status: INITIAL  3.  Pt will improve A/ROM in *** from *** to at least ***, to have functional motion for tasks like reach and grasp.  Goal status: INITIAL  4.  Pt will improve strength in *** from *** MMT to at least *** MMT to have increased functional ability to carry out selfcare and higher-level homecare tasks with less difficulty. Goal status: INITIAL  5.  Pt will improve coordination skills in ***, as seen by within functional limit score on *** testing to have increased functional ability to carry out fine motor tasks (fasteners, etc.) and more complex, coordinated IADLs (meal prep, sports, etc.).  Goal status: INITIAL  6.  Pt will decrease pain at worst from ***/10 to ***/10 or better to have better sleep and occupational participation in daily roles. Goal  status: INITIAL   ASSESSMENT:  CLINICAL IMPRESSION: Patient is a 53 y.o. female who was seen today for occupational therapy evaluation for ***.   PERFORMANCE DEFICITS: in functional skills including ADLs, IADLs, coordination, dexterity, sensation, edema, ROM, strength, pain, fascial restrictions, muscle spasms, flexibility, Gross motor control, body mechanics, endurance, decreased knowledge of precautions, wound, and UE functional use, cognitive skills including problem solving and safety awareness, and psychosocial skills including coping strategies, environmental adaptation, and habits.   IMPAIRMENTS: are limiting patient from ADLs, IADLs, rest and sleep, work, and leisure.   COMORBIDITIES: may have co-morbidities  that affects occupational performance. Patient will benefit from skilled OT to address above impairments and improve overall function.  MODIFICATION OR ASSISTANCE TO COMPLETE EVALUATION: No modification of tasks or assist necessary to complete an evaluation.  OT OCCUPATIONAL PROFILE AND HISTORY: Problem focused assessment: Including review of records relating to presenting problem.  CLINICAL DECISION MAKING: Moderate - several treatment options, min-mod task modification necessary  REHAB POTENTIAL: Excellent  EVALUATION COMPLEXITY: Low      PLAN:  OT FREQUENCY: 1-2x/week   OT DURATION: 6 weeks through 09/17/23 and up to 12 total visits as needed  PLANNED INTERVENTIONS: 97168 OT Re-evaluation, 97535 self care/ADL training, 16109 therapeutic exercise, 97530 therapeutic activity, 97112 neuromuscular re-education, 97140 manual therapy, 97035 ultrasound, 97039 fluidotherapy, 97010 moist heat, 97010 cryotherapy, 97760 Orthotics management and training, 60454 Subsequent splinting/medication, scar mobilization, compression bandaging, Dry needling, coping strategies training, and patient/family education  RECOMMENDED OTHER SERVICES: none now   CONSULTED AND AGREED WITH PLAN  OF CARE: Patient  PLAN FOR NEXT SESSION:   Check orthosis, initial HEP, proceed per protocols    Fannie Knee, OTR/L, CHT 08/04/2023, 10:48 AM

## 2023-08-05 ENCOUNTER — Encounter: Payer: Self-pay | Admitting: Rehabilitative and Restorative Service Providers"

## 2023-08-05 ENCOUNTER — Ambulatory Visit (INDEPENDENT_AMBULATORY_CARE_PROVIDER_SITE_OTHER): Payer: 59 | Admitting: Orthopedic Surgery

## 2023-08-05 ENCOUNTER — Ambulatory Visit: Admitting: Rehabilitative and Restorative Service Providers"

## 2023-08-05 DIAGNOSIS — M25631 Stiffness of right wrist, not elsewhere classified: Secondary | ICD-10-CM

## 2023-08-05 DIAGNOSIS — M25531 Pain in right wrist: Secondary | ICD-10-CM | POA: Diagnosis not present

## 2023-08-05 DIAGNOSIS — M6281 Muscle weakness (generalized): Secondary | ICD-10-CM | POA: Diagnosis not present

## 2023-08-05 DIAGNOSIS — M654 Radial styloid tenosynovitis [de Quervain]: Secondary | ICD-10-CM

## 2023-08-05 DIAGNOSIS — R600 Localized edema: Secondary | ICD-10-CM

## 2023-08-05 DIAGNOSIS — R278 Other lack of coordination: Secondary | ICD-10-CM

## 2023-08-05 NOTE — Progress Notes (Signed)
   Kendra Gonzalez - 52 y.o. female MRN [id number]  Date of birth: 11-03-1970  Office Visit Note: Visit Date: 08/05/2023 PCP: Philip Aspen, Limmie Patricia, MD Referred by: Philip Aspen, Estel*  Subjective:  HPI: Kendra Gonzalez is a 53 y.o. female who presents today for follow up 2 weeks status post right wrist first extensor compartment release.  She is doing well overall, pain is controlled.  Does have some slight numbness in the dorsal radial sensory nerve distribution.  Pertinent ROS were reviewed with the patient and found to be negative unless otherwise specified above in HPI.   Assessment & Plan: Visit Diagnoses:  1. De Quervain's syndrome (tenosynovitis)     Plan: She is doing well overall, pain is controlled.  Suture tails trimmed today, wound is well-healing.  She was fitted for a removable thumb spica brace today and will begin occupational therapy for range of motion protocol later today.  She can progress to strengthening in 4 weeks per protocol.  I will plan on seeing her back in 4 weeks to track her progress.  Work note was provided today for restrictions of the right hand, she can return without heavy lifting.  Follow-up: No follow-ups on file.   Meds & Orders: No orders of the defined types were placed in this encounter.  No orders of the defined types were placed in this encounter.    Procedures: No procedures performed       Objective:   Vital Signs: LMP 06/06/1996 Comment: GYN  Ortho Exam Right wrist: - Well-healing radial incision, suture tails trimmed today, Dermabond in place, no erythema or drainage - Gentle thumb circumduction without significant pain, no evidence of tendon instability - Wrist range of motion flexion/extension 65/55 - Sensation intact light touch in the DRSN distribution  Imaging: No results found.   Elyza Whitt Trevor Mace, M.D. [facility], Hand Surgery

## 2023-08-07 ENCOUNTER — Telehealth: Admitting: Family Medicine

## 2023-08-07 ENCOUNTER — Encounter

## 2023-08-07 DIAGNOSIS — J4530 Mild persistent asthma, uncomplicated: Secondary | ICD-10-CM

## 2023-08-07 MED ORDER — ALBUTEROL SULFATE HFA 108 (90 BASE) MCG/ACT IN AERS: 2.0000 | INHALATION_SPRAY | Freq: Four times a day (QID) | RESPIRATORY_TRACT | 0 refills | Status: DC | PRN

## 2023-08-07 MED ORDER — FLUTICASONE-SALMETEROL 100-50 MCG/ACT IN AEPB: 1.0000 | INHALATION_SPRAY | Freq: Two times a day (BID) | RESPIRATORY_TRACT | 0 refills | Status: AC

## 2023-08-07 MED ORDER — PREDNISONE 20 MG PO TABS: 20.0000 mg | ORAL_TABLET | Freq: Two times a day (BID) | ORAL | 0 refills | Status: AC

## 2023-08-07 MED ORDER — ALBUTEROL SULFATE (2.5 MG/3ML) 0.083% IN NEBU: 2.5000 mg | INHALATION_SOLUTION | Freq: Four times a day (QID) | RESPIRATORY_TRACT | 0 refills | Status: DC | PRN

## 2023-08-07 MED ORDER — PROMETHAZINE-DM 6.25-15 MG/5ML PO SYRP: 5.0000 mL | ORAL_SOLUTION | Freq: Four times a day (QID) | ORAL | 0 refills | Status: AC | PRN

## 2023-08-07 NOTE — Patient Instructions (Signed)
 Asthma, Adult  Asthma is a long-term (chronic) condition that causes recurrent episodes in which the lower airways in the lungs become tight and narrow. The narrowing is caused by inflammation and tightening of the smooth muscle around the lower airways. Asthma episodes, also called asthma attacks or asthma flares, may cause coughing, making high-pitched whistling sounds when you breathe, most often when you breathe out (wheezing), shortness of breath, and chest pain. The airways may produce extra mucus caused by the inflammation and irritation. During an attack, it can be difficult to breathe. Asthma attacks can range from minor to life-threatening. Asthma cannot be cured, but medicines and lifestyle changes can help control it and treat acute attacks. It is important to keep your asthma well controlled so the condition does not interfere with your daily life. What are the causes? This condition is believed to be caused by inherited (genetic) and environmental factors, but its exact cause is not known. What can trigger an asthma attack? Many things can bring on an asthma attack or make symptoms worse. These triggers are different for every person. Common triggers include: Allergens and irritants like mold, dust, pet dander, cockroaches, pollen, air pollution, and chemical odors. Cigarette smoke. Weather changes and cold air. Stress and strong emotional responses such as crying or laughing hard. Certain medications such as aspirin or beta blockers. Infections and inflammatory conditions, such as the flu, a cold, pneumonia, or inflammation of the nasal membranes (rhinitis). Gastroesophageal reflux disease (GERD). What are the signs or symptoms? Symptoms may occur right after exposure to an asthma trigger or hours later and can vary by person. Common signs and symptoms include: Wheezing. Trouble breathing (shortness of breath). Excessive nighttime or early morning coughing. Chest  tightness. Tiredness (fatigue) with minimal activity. Difficulty talking in complete sentences. Poor exercise tolerance. How is this diagnosed? This condition is diagnosed based on: A physical exam and your medical history. Tests, which may include: Lung function studies to evaluate the flow of air in your lungs. Allergy tests. Imaging tests, such as X-rays. How is this treated? There is no cure, but symptoms can be controlled with proper treatment. Treatment usually involves: Identifying and avoiding your asthma triggers. Inhaled medicines. Two types are commonly used to treat asthma, depending on severity: Controller medicines. These help prevent asthma symptoms from occurring. They are taken every day. Fast-acting reliever or rescue medicines. These quickly relieve asthma symptoms. They are used as needed and provide short-term relief. Using other medicines, such as: Allergy medicines, such as antihistamines, if your asthma attacks are triggered by allergens. Immune medicines (immunomodulators). These are medicines that help control the immune system. Using supplemental oxygen. This is only needed during a severe episode. Creating an asthma action plan. An asthma action plan is a written plan for managing and treating your asthma attacks. This plan includes: A list of your asthma triggers and how to avoid them. Information about when medicines should be taken and when their dosage should be changed. Instructions about using a device called a peak flow meter. A peak flow meter measures how well the lungs are working and the severity of your asthma. It helps you monitor your condition. Follow these instructions at home: Take over-the-counter and prescription medicines only as told by your health care provider. Stay up to date on all vaccinations as recommended by your healthcare provider, including vaccines for the flu and pneumonia. Use a peak flow meter and keep track of your peak flow  readings. Understand and use your asthma  action plan to address any asthma flares. Do not smoke or allow anyone to smoke in your home. Contact a health care provider if: You have wheezing, shortness of breath, or a cough that is not responding to medicines. Your medicines are causing side effects, such as a rash, itching, swelling, or trouble breathing. You need to use a reliever medicine more than 2-3 times a week. Your peak flow reading is still at 50-79% of your personal best after following your action plan for 1 hour. You have a fever and shortness of breath. Get help right away if: You are getting worse and do not respond to treatment during an asthma attack. You are short of breath when at rest or when doing very little physical activity. You have difficulty eating, drinking, or talking. You have chest pain or tightness. You develop a fast heartbeat or palpitations. You have a bluish color to your lips or fingernails. You are light-headed or dizzy, or you faint. Your peak flow reading is less than 50% of your personal best. You feel too tired to breathe normally. These symptoms may be an emergency. Get help right away. Call 911. Do not wait to see if the symptoms will go away. Do not drive yourself to the hospital. Summary Asthma is a long-term (chronic) condition that causes recurrent episodes in which the airways become tight and narrow. Asthma episodes, also called asthma attacks or asthma flares, can cause coughing, wheezing, shortness of breath, and chest pain. Asthma cannot be cured, but medicines and lifestyle changes can help keep it well controlled and prevent asthma flares. Make sure you understand how to avoid triggers and how and when to use your medicines. Asthma attacks can range from minor to life-threatening. Get help right away if you have an asthma attack and do not respond to treatment with your usual rescue medicines. This information is not intended to replace  advice given to you by your health care provider. Make sure you discuss any questions you have with your health care provider. Document Revised: 01/29/2021 Document Reviewed: 01/20/2021 Elsevier Patient Education  2024 [employer].

## 2023-08-07 NOTE — Progress Notes (Signed)
 Virtual Visit Consent   Kendra Gonzalez, you are scheduled for a virtual visit with a [facility] provider today. Just as with appointments in the office, your consent must be obtained to participate. Your consent will be active for this visit and any virtual visit you may have with one of our providers in the next 365 days. If you have a MyChart account, a copy of this consent can be sent to you electronically.  As this is a virtual visit, video technology does not allow for your provider to perform a traditional examination. This may limit your provider's ability to fully assess your condition. If your provider identifies any concerns that need to be evaluated in person or the need to arrange testing (such as labs, EKG, etc.), we will make arrangements to do so. Although advances in technology are sophisticated, we cannot ensure that it will always work on either your end or our end. If the connection with a video visit is poor, the visit may have to be switched to a telephone visit. With either a video or telephone visit, we are not always able to ensure that we have a secure connection.  By engaging in this virtual visit, you consent to the provision of healthcare and authorize for your insurance to be billed (if applicable) for the services provided during this visit. Depending on your insurance coverage, you may receive a charge related to this service.  I need to obtain your verbal consent now. Are you willing to proceed with your visit today? Kendra Gonzalez has provided verbal consent on 08/07/2023 for a virtual visit (video or telephone). Albertha Huger, FNP  Date: 08/07/2023 3:46 PM   Virtual Visit via Video Note   I, Albertha Huger, connected with  Kendra Gonzalez  ([id number], 1971-04-12) on 08/07/23 at  3:45 PM EDT by a video-enabled telemedicine application and verified that I am speaking with the correct person using two identifiers.  Location: Patient: Virtual  Visit Location Patient: Home Provider: Virtual Visit Location Provider: Home Office   I discussed the limitations of evaluation and management by telemedicine and the availability of in person appointments. The patient expressed understanding and agreed to proceed.    History of Present Illness: Kendra Gonzalez is a 53 y.o. who identifies as a female who was assigned female at birth, and is being seen today for asthma, allergy flare with excessive coughing. She ran out of her inhalers she has to use this time and year and needs refills. She is in no distress. No fever or signs of bacterial infection. 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   Unilateral primary osteoarthritis, left knee 07/16/2020   Presence of left artificial hip joint 07/09/2017   Multinodular goiter 04/17/2015   History of stomach ulcers 03/22/2015   Chronic pain of left heel 04/23/2014   Chronic pain syndrome 03/19/2014   Asthma 01/04/2013   BMI 29.0-29.9,adult 10/12/2011   Paroxysmal nerve pain 01/22/2011   Hyperlipidemia 02/16/2007   Allergic rhinitis 02/16/2007   GERD 02/16/2007    Allergies:  Allergies  Allergen Reactions   Hydromorphone Itching   Medications:  Current Outpatient Medications:    acetaminophen-codeine (  TYLENOL #3) 300-30 MG tablet, Take 1 tablet by mouth every 6 (six) hours as needed for moderate pain (pain score 4-6)., Disp: 26 tablet, Rfl: 0   acetaminophen (TYLENOL) 500 MG tablet, Take 1,000 mg by mouth every 8 (eight) hours as needed for moderate pain., Disp: , Rfl:    albuterol (PROVENTIL) (2.5 MG/3ML) 0.083% nebulizer solution, Take 3 mLs (2.5 mg  total) by nebulization every 6 (six) hours as needed for wheezing or shortness of breath., Disp: 150 mL, Rfl: 1   albuterol (VENTOLIN HFA) 108 (90 Base) MCG/ACT inhaler, Inhale 2 puffs into the lungs every 6 (six) hours as needed for wheezing or shortness of breath., Disp: 18 g, Rfl: 2   atorvastatin (LIPITOR) 40 MG tablet, Take 1 tablet (40 mg total) by mouth daily., Disp: 90 tablet, Rfl: 1   azelastine (ASTELIN) 0.1 % nasal spray, Place 1 spray into both nostrils 2 (two) times daily. Use in each nostril as directed, Disp: 30 mL, Rfl: 0   baclofen (LIORESAL) 10 MG tablet, Take 1 tablet (10 mg total) by mouth 2 (two) times daily as needed (migraines)., Disp: 90 each, Rfl: 1   cyanocobalamin (VITAMIN B12) 1000 MCG/ML injection, Inject 1 ml into the muscle once a week for a month. Then injected 1 ml once a month thereafter., Disp: 6 mL, Rfl: 11   fluticasone-salmeterol (ADVAIR HFA) 115-21 MCG/ACT inhaler, Inhale 2 puffs into the lungs 2 (two) times daily., Disp: 1 each, Rfl: 2   gabapentin (NEURONTIN) 300 MG capsule, Take 1 capsule (300 mg total) by mouth 3 (three) times daily. (Patient not taking: Reported on 01/14/2023), Disp: 90 capsule, Rfl: 0   Galcanezumab-gnlm (EMGALITY) 120 MG/ML SOAJ, Inject 120 mg into the skin every 28 (twenty-eight) days., Disp: 1 mL, Rfl: 11   levothyroxine (SYNTHROID) 125 MCG tablet, Take 1 tablet (125 mcg total) by mouth daily., Disp: 90 tablet, Rfl: 3   Magnesium Gluconate 550 MG TABS, TAKE 1 TABLET BY MOUTH AT BEDTIME., Disp: 90 tablet, Rfl: 0   meloxicam (MOBIC) 7.5 MG tablet, Take 1 tablet (7.5 mg total) by mouth daily., Disp: 30 tablet, Rfl: 0   methylPREDNISolone (MEDROL DOSEPAK) 4 MG TBPK tablet, Take as directed with food, Disp: 21 tablet, Rfl: 0   montelukast (SINGULAIR) 10 MG tablet, Take 1 tablet (10 mg total) by mouth at bedtime., Disp: 30 tablet, Rfl: 0   ondansetron (ZOFRAN) 4 MG tablet, Take 1 tablet (4 mg total) by mouth every 6 (six) hours., Disp: 20  tablet, Rfl: 11   SUMAtriptan (IMITREX) 20 MG/ACT nasal spray, Place 1 spray (20 mg total) into the nose as needed for migraine or headache. May repeat in 2 hours if headache persists or recurs.  Maximum 2 sprays in 24 hours., Disp: 6 each, Rfl: 11   SYRINGE-NEEDLE, DISP, 3 ML (BD SAFETYGLIDE SYRINGE/NEEDLE) 25G X 1" 3 ML MISC, Use as directed, Disp: 100 each, Rfl: 3   valsartan-hydrochlorothiazide (DIOVAN-HCT) 160-25 MG tablet, Take 1 tablet by mouth daily., Disp: 90 tablet, Rfl: 1   Vitamin D, Ergocalciferol, (DRISDOL) 1.25 MG (50000 UNIT) CAPS capsule, Take 1 capsule (50,000 Units total) by mouth every 7 (seven) days for 12 doses., Disp: 12 capsule, Rfl: 0  Observations/Objective: Patient is well-developed, well-nourished in no acute distress.  Resting comfortably  at home.  Head is normocephalic, atraumatic.  No labored breathing.  Speech is clear and coherent with logical content.  Patient is alert and oriented at baseline.    Assessment and Plan: 1. Mild  persistent asthma without complication  Increase fluids, humidifier at night, tylenol as needed, UC as needed.   Follow Up Instructions: I discussed the assessment and treatment plan with the patient. The patient was provided an opportunity to ask questions and all were answered. The patient agreed with the plan and demonstrated an understanding of the instructions.  A copy of instructions were sent to the patient via MyChart unless otherwise noted below.     The patient was advised to call back or seek an in-person evaluation if the symptoms worsen or if the condition fails to improve as anticipated.    Arshia Spellman, FNP

## 2023-08-09 ENCOUNTER — Telehealth: Payer: Self-pay

## 2023-08-09 ENCOUNTER — Encounter: Admitting: Rehabilitative and Restorative Service Providers"

## 2023-08-09 NOTE — Telephone Encounter (Signed)
 Patient called triage line and stated she was given a work note to return to work on 05/07.  However patient needing another note with date but that has additional details of wearing brace as needed and that she may remove brace to perform proper hand hygiene.  Contact for patient [phone]

## 2023-08-10 ENCOUNTER — Encounter: Payer: Self-pay | Admitting: Orthopedic Surgery

## 2023-08-10 NOTE — Therapy (Signed)
 OUTPATIENT OCCUPATIONAL THERAPY TREATMENT NOTE   Patient Name: Kendra Gonzalez MRN: [id number] DOB:[birth year], 53 y.o., female Today's Date: 08/11/2023  PCP: George Kinder MD REFERRING PROVIDER:  Merrill Abide, MD    END OF SESSION:  OT End of Session - 08/11/23 1603     Visit Number 2    Number of Visits 6    Date for OT Re-Evaluation 09/17/23    Authorization Type UHC    OT Start Time 1603    OT Stop Time 1628    OT Time Calculation (min) 25 min    Activity Tolerance Patient tolerated treatment well;Patient limited by fatigue;Patient limited by pain;No increased pain    Behavior During Therapy WFL for tasks assessed/performed              Past Medical History:  Diagnosis Date   Allergy    Arthritis    Asthma    Cataract    bilateral eyes   Endometriosis    GERD (gastroesophageal reflux disease)    Headache    migraines   History of tetanus, diphtheria, and acellular pertussis booster vaccination (Tdap)    07/30/2010   Hyperlipidemia    Kidney stones    Pancreatitis    Past Surgical History:  Procedure Laterality Date   ABDOMINAL HYSTERECTOMY     arthroscopic knee surgery      carpel tunnel     CHOLECYSTECTOMY     ERCP  01/08/2012   Procedure: ENDOSCOPIC RETROGRADE CHOLANGIOPANCREATOGRAPHY (ERCP);  Surgeon: Almeda Aris, MD;  Location: [facility] ENDOSCOPY;  Service: Endoscopy;  Laterality: N/A;   HIP ARTHROPLASTY     left   HIP SURGERY     oophrectomy  2011   secondary to cysts   THYROIDECTOMY N/A 11/21/2020   Procedure: TOTAL THYROIDECTOMY;  Surgeon: Oralee Billow, MD;  Location: [facility] ORS;  Service: General;  Laterality: N/A;   TUBAL LIGATION     UPPER GASTROINTESTINAL ENDOSCOPY  2016   Patient Active Problem List   Diagnosis Date Noted   De Quervain's syndrome (tenosynovitis) 04/05/2023   Vitamin B12 deficiency 03/23/2023   Pain in right hip 01/28/2022   Hypothyroidism 05/16/2021   Vitamin D deficiency 04/18/2021   IGT (impaired glucose  tolerance) 04/18/2021   Hypercalcemia 02/24/2021   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   Unilateral primary osteoarthritis, left knee 07/16/2020   Presence of left artificial hip joint 07/09/2017   Multinodular goiter 04/17/2015   History of stomach ulcers 03/22/2015   Chronic pain of left heel 04/23/2014   Chronic pain syndrome 03/19/2014   Asthma 01/04/2013   BMI 29.0-29.9,adult 10/12/2011   Paroxysmal nerve pain 01/22/2011   Hyperlipidemia 02/16/2007   Allergic rhinitis 02/16/2007   GERD 02/16/2007    ONSET DATE: DOS 07/22/23  REFERRING DIAG: M65.4 (ICD-10-CM) - Eddie Good Quervain's syndrome (tenosynovitis)   THERAPY DIAG:  Localized edema  Muscle weakness (generalized)  Pain in right wrist  Stiffness of right wrist, not elsewhere classified  Rationale for Evaluation and Treatment: Rehabilitation  PERTINENT HISTORY: None significant pertaining to this injury and issue S/p Rt D.Q.R.  She shows up late for eval, states pain for ~8 months, and she is a CNA.  She picks up her grandkids a lot which hurts as well. She is 2 weeks out from sx with dissolvable wound glue in place, has pre-fab thumb spica on.   PRECAUTIONS: None  RED FLAGS: None   WEIGHT BEARING RESTRICTIONS: No    SUBJECTIVE:   SUBJECTIVE STATEMENT: 3 weeks S/p Rt  D.Q.R.  She  states having no pain now, none significant in the past week.  She has been careful at work wearing her brace, but not wearing it at other times.     PAIN:  Are you having pain? Yes: NPRS scale:  0/10 now with rest and writing with a pen Pain location: Right wrist distally Pain description: aching Aggravating factors: weight bearing  Relieving factors: rest   PATIENT GOALS: To improve use of right dominant hand and arm  NEXT MD VISIT: 09/01/2023   OBJECTIVE: (All objective assessments below are from initial evaluation on: 08/05/23 unless otherwise specified.)   HAND DOMINANCE: Right   ADLs: Overall ADLs: States decreased  ability to grab, hold household objects, pain and difficulty to open containers, perform FMS tasks (manipulate fasteners on clothing)   FUNCTIONAL OUTCOME MEASURES: Eval: Quick DASH 64% impairment today  (Higher % Score  =  More Impairment)    UPPER EXTREMITY ROM     Shoulder to Wrist AROM Right eval Rt 08/11/23  Forearm supination 55 77  Forearm pronation  87 85  Wrist flexion 16 40  Wrist extension 36 48  Wrist ulnar deviation 6   Wrist radial deviation 11   Functional dart thrower's motion (F-DTM) in ulnar flexion    F-DTM in radial extension     (Blank rows = not tested)   Hand AROM Right eval  Full Fist Ability (or Gap to Distal Palmar Crease) Loose full fist   Thumb Opposition  (Kapandji Scale)  10/10  (Blank rows = not tested)   UPPER EXTREMITY MMT:    Eval:  NT at eval due to recent and still healing injuries. Will be tested when appropriate.   MMT Right TBD  Shoulder flexion   Shoulder abduction   Shoulder adduction   Shoulder extension   Shoulder internal rotation   Shoulder external rotation   Middle trapezius   Lower trapezius   Elbow flexion   Elbow extension   Forearm supination   Forearm pronation   Wrist flexion   Wrist extension   Wrist ulnar deviation   Wrist radial deviation   (Blank rows = not tested)  HAND FUNCTION: Eval: Observed weakness in affected Rt hand. Will be tested when appropriate  Grip strength Right: NT lbs, Left: 50 lbs   COORDINATION: Eval: Observed coordination impairments with affected Rt hand. 9 Hole Peg Test Right: 27sec  (22 sec is WFL)   SENSATION: Eval:  Light touch intact today, though diminished around sx area and radial nerve distribution   EDEMA:   Eval:  Mildly swollen in Rt wrist today, 6.8cm circumferentially around 5.8  OBSERVATIONS:   Eval: Surgical area looks clean with glue sealing wound.  No signs of infection.  No significant tenderness to palpation or hypersensitivity today.   TODAY'S  TREATMENT:  08/11/23: She starts with active range of motion for exercise as well as new measures which shows excellent improvement at the forearm and the wrist.  OT then puts her on moist heat for 3 minutes to loosen tissues while explaining new upgraded stretches to her home exercise program.  OT then does manual therapy IASTM and myofascial release around the scar area as well as down the length of the forearm to loosen up tight related muscle groups.  OT also gives nonslip Dycem and does more aggressive scar mobilizations now.  She then does new stretches and is educated to continue her other home exercises as well-she should be performing these in a "  warm up, massage, stretch, move" order.  She states understanding all directions and leaves feeling no pain    Exercises - Turn Palm Facing Up & Down  - 4-6 x daily - 10-15 reps - Bend and Pull Back Wrist SLOWLY  - 4 x daily - 10-15 reps - "Windshield Wipers"   - 4 x daily - 10-15 reps - Standing Radial Nerve Glide  - 4-6 x daily - 1 sets - 10-15 reps - Forearm Supination Stretch  - 3-4 x daily - 3-5 reps - 15 sec hold - Wrist Flexion Stretch  - 4 x daily - 3-5 reps - 15 sec hold - Seated Wrist Extension Stretch  - 3-6 x daily - 3-5 reps - 15 hold Patient Education - Scar Massage  PATIENT EDUCATION: Education details: See tx section above for details  Person educated: Patient Education method: Verbal Instruction, Teach back, Handouts  Education comprehension: States and demonstrates understanding, Additional Education required    HOME EXERCISE PROGRAM: Access Code: [email or web address] URL: [email or web address] Date: 08/05/2023 Prepared by: Leartis Proud   GOALS: Goals reviewed with patient? Yes   SHORT TERM GOALS: (STG required if POC>30 days) Target Date: 08/20/2023  Pt will obtain protective, custom orthotic. Goal status: TBD/PRN  2.  Pt will demo/state understanding of initial HEP to improve pain levels and prerequisite  motion. Goal status: 08/11/23: MET   LONG TERM GOALS: Target Date: 09/17/23  Pt will improve functional ability by decreased impairment per Quick DASH assessment from 64% to 20% or better, for better quality of life. Goal status: INITIAL  2.  Pt will improve grip strength in right dominant hand from unsafe to test to at least 35 lbs for functional use at home and in IADLs. Goal status: INITIAL  3.  Pt will improve A/ROM in right wrist flexion/extension from 16/36 degrees to at least 55 degrees each, to have functional motion for tasks like reach and grasp.  Goal status: INITIAL  4.  Pt will improve strength in right wrist flexion/extension from apparent 3 -/5 MMT to at least 4+/5 MMT to have increased functional ability to carry out selfcare and higher-level homecare tasks with less difficulty. Goal status: INITIAL  5.  Pt will improve coordination skills in right hand and arm, as seen by within functional limit score on nine-hole peg testing to have increased functional ability to carry out fine motor tasks (fasteners, etc.) and more complex, coordinated IADLs (meal prep, sports, etc.).  Goal status: INITIAL  6.  Pt will decrease pain at rest from 3-4/10 to 1/10 or better to have better sleep and occupational participation in daily roles. Goal status: INITIAL   ASSESSMENT:  CLINICAL IMPRESSION: 08/11/23: Excellent motion now, no pain now, still needs to be careful to prevent an exacerbation in the next 3 weeks.  She should still not be doing any significant weightbearing.    PLAN:  OT FREQUENCY: 1-2x/week   OT DURATION: 6 weeks through 09/17/23 and up to 6 total visits as needed  PLANNED INTERVENTIONS: 97168 OT Re-evaluation, 97535 self care/ADL training, 40981 therapeutic exercise, 97530 therapeutic activity, 97112 neuromuscular re-education, 97140 manual therapy, 97035 ultrasound, 97039 fluidotherapy, 97010 moist heat, 97010 cryotherapy, 97760 Orthotics management and training,  [phone] Subsequent splinting/medication, scar mobilization, compression bandaging, Dry needling, coping strategies training, and patient/family education  RECOMMENDED OTHER SERVICES: none now   CONSULTED AND AGREED WITH PLAN OF CARE: Patient  PLAN FOR NEXT SESSION:    Add in light hand strengthening and consider light strength at  the wrist as well, if she is doing fine and tolerating well without any pain.  Leartis Proud, OTR/L, CHT 08/11/2023, 4:40 PM

## 2023-08-11 ENCOUNTER — Ambulatory Visit: Admitting: Rehabilitative and Restorative Service Providers"

## 2023-08-11 ENCOUNTER — Other Ambulatory Visit: Payer: Self-pay | Admitting: Orthopedic Surgery

## 2023-08-11 ENCOUNTER — Encounter: Payer: Self-pay | Admitting: Rehabilitative and Restorative Service Providers"

## 2023-08-11 DIAGNOSIS — M6281 Muscle weakness (generalized): Secondary | ICD-10-CM | POA: Diagnosis not present

## 2023-08-11 DIAGNOSIS — R600 Localized edema: Secondary | ICD-10-CM

## 2023-08-11 DIAGNOSIS — M25531 Pain in right wrist: Secondary | ICD-10-CM

## 2023-08-11 DIAGNOSIS — M25631 Stiffness of right wrist, not elsewhere classified: Secondary | ICD-10-CM | POA: Diagnosis not present

## 2023-08-11 NOTE — Telephone Encounter (Signed)
 New note provided via mychart per patient request

## 2023-08-12 ENCOUNTER — Telehealth: Payer: Self-pay | Admitting: Neurology

## 2023-08-12 NOTE — Telephone Encounter (Signed)
 Pt would like Rx or samples for Migraine

## 2023-08-12 NOTE — Therapy (Signed)
 OUTPATIENT OCCUPATIONAL THERAPY TREATMENT & discharge NOTE   Patient Name: Kendra Gonzalez MRN: [id number] DOB:[birth year], 53 y.o., female Today's Date: 08/16/2023  PCP: George Kinder MD REFERRING PROVIDER:  Merrill Abide, MD           OCCUPATIONAL THERAPY DISCHARGE SUMMARY  Visits from Start of Care: 3  Current functional level related to goals / functional outcomes: Pt has met all goals to satisfactory levels and is pleased with outcomes.   Remaining deficits: Pt has no more significant functional deficits or pain.   Education / Equipment: Pt has all needed materials and education. Pt understands how to continue on with self-management. See tx notes for more details.   Patient agrees to discharge due to max benefits received from outpatient occupational therapy / hand therapy at this time.   Leartis Proud, OTR/L, CHT 08/16/23               END OF SESSION:  OT End of Session - 08/16/23 1010     Visit Number 3    Number of Visits 6    Date for OT Re-Evaluation 09/17/23    Authorization Type UHC    OT Start Time 1010    OT Stop Time 1040    OT Time Calculation (min) 30 min    Equipment Utilized During Treatment red bands, green putty    Activity Tolerance Patient tolerated treatment well;Patient limited by fatigue;Patient limited by pain;No increased pain    Behavior During Therapy WFL for tasks assessed/performed             Past Medical History:  Diagnosis Date   Allergy    Arthritis    Asthma    Cataract    bilateral eyes   Endometriosis    GERD (gastroesophageal reflux disease)    Headache    migraines   History of tetanus, diphtheria, and acellular pertussis booster vaccination (Tdap)    07/30/2010   Hyperlipidemia    Kidney stones    Pancreatitis    Past Surgical History:  Procedure Laterality Date   ABDOMINAL HYSTERECTOMY     arthroscopic knee surgery      carpel tunnel     CHOLECYSTECTOMY     ERCP   01/08/2012   Procedure: ENDOSCOPIC RETROGRADE CHOLANGIOPANCREATOGRAPHY (ERCP);  Surgeon: Almeda Aris, MD;  Location: [facility] ENDOSCOPY;  Service: Endoscopy;  Laterality: N/A;   HIP ARTHROPLASTY     left   HIP SURGERY     oophrectomy  2011   secondary to cysts   THYROIDECTOMY N/A 11/21/2020   Procedure: TOTAL THYROIDECTOMY;  Surgeon: Oralee Billow, MD;  Location: [facility] ORS;  Service: General;  Laterality: N/A;   TUBAL LIGATION     UPPER GASTROINTESTINAL ENDOSCOPY  2016   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   Unilateral primary osteoarthritis, left knee 07/16/2020   Presence of left artificial hip joint 07/09/2017   Multinodular goiter 04/17/2015   History of stomach ulcers 03/22/2015   Chronic pain of left heel 04/23/2014   Chronic pain syndrome 03/19/2014   Asthma 01/04/2013   BMI 29.0-29.9,adult 10/12/2011   Paroxysmal nerve pain 01/22/2011   Hyperlipidemia 02/16/2007   Allergic rhinitis 02/16/2007   GERD 02/16/2007  ONSET DATE: DOS 07/22/23  REFERRING DIAG: M65.4 (ICD-10-CM) - De Quervain's syndrome (tenosynovitis)   THERAPY DIAG:  Localized edema  Muscle weakness (generalized)  Pain in right wrist  Stiffness of right wrist, not elsewhere classified  Other lack of coordination  Rationale for Evaluation and Treatment: Rehabilitation  PERTINENT HISTORY: None significant pertaining to this injury and issue S/p Rt D.Q.R.  She shows up late for eval, states pain for ~8 months, and she is a CNA.  She picks up her grandkids a lot which hurts as well. She is 2 weeks out from sx with dissolvable wound glue in place, has pre-fab thumb spica on.   PRECAUTIONS: None  RED  FLAGS: None   WEIGHT BEARING RESTRICTIONS: No    SUBJECTIVE:   SUBJECTIVE STATEMENT: 4 weeks S/p Rt D.Q.R.  She  states doing well, having no pain or problems in the past week, still wearing her brace at work cautiously.     PAIN:  Are you having pain? Yes: NPRS scale:   0/10 now with rest and writing with a pen Pain location: Right wrist distally Pain description: aching Aggravating factors: weight bearing  Relieving factors: rest   PATIENT GOALS: To improve use of right dominant hand and arm  NEXT MD VISIT: 09/01/2023   OBJECTIVE: (All objective assessments below are from initial evaluation on: 08/05/23 unless otherwise specified.)   HAND DOMINANCE: Right   ADLs: Overall ADLs: States decreased ability to grab, hold household objects, pain and difficulty to open containers, perform FMS tasks (manipulate fasteners on clothing)   FUNCTIONAL OUTCOME MEASURES: 08/16/23: Quick DASH: 6.8%  NOW   Eval: Quick DASH 64% impairment today  (Higher % Score  =  More Impairment)    UPPER EXTREMITY ROM     Shoulder to Wrist AROM Right eval Rt 08/11/23 Rt 08/16/23  Forearm supination 55 77   Forearm pronation  87 85   Wrist flexion 16 40 62  Wrist extension 36 48 54  Wrist ulnar deviation 6   21  Wrist radial deviation 11  22  Functional dart thrower's motion (F-DTM) in ulnar flexion     F-DTM in radial extension      (Blank rows = not tested)   Hand AROM Right eval  Full Fist Ability (or Gap to Distal Palmar Crease) Loose full fist   Thumb Opposition  (Kapandji Scale)  10/10  (Blank rows = not tested)   UPPER EXTREMITY MMT:      MMT Right 08/16/23  Shoulder flexion   Shoulder abduction   Shoulder adduction   Shoulder extension   Shoulder internal rotation   Shoulder external rotation   Middle trapezius   Lower trapezius   Elbow flexion   Elbow extension   Forearm supination 4+/5  Forearm pronation 4+/5  Wrist flexion 4+/5  Wrist extension 4+/5  Wrist  ulnar deviation   Wrist radial deviation   (Blank rows = not tested)  HAND FUNCTION: 08/16/23: Grip strength Right: 40 lbs  Eval: Observed weakness in affected Rt hand. Will be tested when appropriate  Grip strength Right: NT lbs, Left: 50 lbs   COORDINATION: 08/16/23: 9HPT: Rt: 17 sec  WNL  Eval: Observed coordination impairments with affected Rt hand. 9 Hole Peg Test Right: 27sec  (22 sec is WFL)    OBSERVATIONS:   Eval: Surgical area looks clean with glue sealing wound.  No signs of infection.  No significant tenderness to palpation or hypersensitivity today.   TODAY'S TREATMENT:  08/16/23: Pt  performs AROM, gripping, and strength with Rt hand/arm against therapist's resistance for exercise/activities as well as new measures today. OT also discusses home and functional tasks with the pt and reviews goals. Using the complied data, OT also reviews home exercises and provides updated recommendations and upgrades as below, including new strengthening for the hand and wrist to be done carefully, not repetitively, and in a nonpainful fashion.  She did well with both today, but if she has any pain she should withhold them for 1-2 more weeks.  We discussed safety at work and continuing to wear her prefabricated brace for 1 or 3 more weeks while working, but she does not need to wear it at all at home now.. Pt states understanding and tolerates upgrades well, happy to discharge today    Exercises - Standing Radial Nerve Glide  - 4-6 x daily - 1 sets - 10-15 reps - Wrist Flexion Stretch  - 4 x daily - 3-5 reps - 15 sec hold - Wrist Prayer Stretch  - 4 x daily - 3-5 reps - 15 sec hold - Seated Wrist Radial Ulnar Deviation PROM  - 3 x daily - 3-5 reps - 15 sec hold - Full Fist  - 2-3 x daily - 5 reps - Thumb Press  - 2-3 x daily - 5 reps - Finger Pinch and Pull with Putty  - 2-3 x daily - 5 reps - Wrist Extension with Resistance  - 2-4 x daily - 1-2 sets - 10-15 reps - Wrist Flexion with  Resistance  - 2-4 x daily - 1-2 sets - 10-15 reps  PATIENT EDUCATION: Education details: See tx section above for details  Person educated: Patient Education method: Engineer, structural, Teach back, Handouts  Education comprehension: States and demonstrates understanding, Additional Education required    HOME EXERCISE PROGRAM: Access Code: [email or web address] URL: [email or web address] Date: 08/05/2023 Prepared by: Leartis Proud   GOALS: Goals reviewed with patient? Yes   SHORT TERM GOALS: (STG required if POC>30 days) Target Date: 08/20/2023  Pt will obtain protective, custom orthotic. Goal status: TBD/PRN  2.  Pt will demo/state understanding of initial HEP to improve pain levels and prerequisite motion. Goal status: 08/11/23: MET   LONG TERM GOALS: Target Date: 09/17/23  Pt will improve functional ability by decreased impairment per Quick DASH assessment from 64% to 20% or better, for better quality of life. Goal status: 08/16/23: MET  2.  Pt will improve grip strength in right dominant hand from unsafe to test to at least 35 lbs for functional use at home and in IADLs. Goal status: 08/16/23: MET 40#   3.  Pt will improve A/ROM in right wrist flexion/extension from 16/36 degrees to at least 55 degrees each, to have functional motion for tasks like reach and grasp.  Goal status: 08/16/23: MET now 62/54  4.  Pt will improve strength in right wrist flexion/extension from apparent 3 -/5 MMT to at least 4+/5 MMT to have increased functional ability to carry out selfcare and higher-level homecare tasks with less difficulty. Goal status: 08/16/23: MET   5.  Pt will improve coordination skills in right hand and arm, as seen by within functional limit score on nine-hole peg testing to have increased functional ability to carry out fine motor tasks (fasteners, etc.) and more complex, coordinated IADLs (meal prep, sports, etc.).  Goal status: 08/16/23: MET  6.  Pt will decrease  pain at rest from 3-4/10 to 1/10 or better to have better sleep and occupational participation  in daily roles. Goal status: 08/16/23: MET    ASSESSMENT:  CLINICAL IMPRESSION: 08/16/23: She has met all long-term goals now, has no pain or paresthesia, working on light strengthening without any exacerbation, states comfortable with discharge.    PLAN:  OT FREQUENCY: D/C  OT DURATION: D/C  PLANNED INTERVENTIONS: 97168 OT Re-evaluation, 97535 self care/ADL training, 16109 therapeutic exercise, 97530 therapeutic activity, 97112 neuromuscular re-education, 97140 manual therapy, 97035 ultrasound, 97039 fluidotherapy, 97010 moist heat, 97010 cryotherapy, 97760 Orthotics management and training, 60454 Subsequent splinting/medication, scar mobilization, compression bandaging, Dry needling, coping strategies training, and patient/family education  RECOMMENDED OTHER SERVICES: none now   CONSULTED AND AGREED WITH PLAN OF CARE: Patient  PLAN FOR NEXT SESSION:    D/C  Leartis Proud, OTR/L, CHT 08/16/2023, 10:44 AM

## 2023-08-16 ENCOUNTER — Ambulatory Visit: Admitting: Rehabilitative and Restorative Service Providers"

## 2023-08-16 ENCOUNTER — Encounter: Payer: Self-pay | Admitting: Rehabilitative and Restorative Service Providers"

## 2023-08-16 DIAGNOSIS — M25631 Stiffness of right wrist, not elsewhere classified: Secondary | ICD-10-CM | POA: Diagnosis not present

## 2023-08-16 DIAGNOSIS — R600 Localized edema: Secondary | ICD-10-CM

## 2023-08-16 DIAGNOSIS — R278 Other lack of coordination: Secondary | ICD-10-CM

## 2023-08-16 DIAGNOSIS — M25531 Pain in right wrist: Secondary | ICD-10-CM

## 2023-08-16 DIAGNOSIS — M6281 Muscle weakness (generalized): Secondary | ICD-10-CM

## 2023-08-18 MED ORDER — ELETRIPTAN HYDROBROMIDE 40 MG PO TABS: 40.0000 mg | ORAL_TABLET | ORAL | 5 refills | Status: DC | PRN

## 2023-08-18 NOTE — Telephone Encounter (Signed)
 Pt called informed that Dr Festus Hubert said we can try ELETRIPTAN  40MG  - TAKE 1 TAB AS NEEDED.  MAY REPEAT AFTER 2 HOURS.  MAXIMUM 2 TABLETS IN 24 HOURS  pt would like to try it, rx sent in, to [facility]

## 2023-08-18 NOTE — Telephone Encounter (Signed)
 Pt is on SUMAtriptan  nasal spray she said that is it is expensive and she only gets 6 is is asking is she can be put on something else.

## 2023-08-23 ENCOUNTER — Encounter: Admitting: Rehabilitative and Restorative Service Providers"

## 2023-08-30 ENCOUNTER — Encounter: Admitting: Rehabilitative and Restorative Service Providers"

## 2023-09-01 ENCOUNTER — Ambulatory Visit (INDEPENDENT_AMBULATORY_CARE_PROVIDER_SITE_OTHER): Admitting: Orthopedic Surgery

## 2023-09-01 DIAGNOSIS — M654 Radial styloid tenosynovitis [de Quervain]: Secondary | ICD-10-CM

## 2023-09-01 NOTE — Progress Notes (Signed)
   Kendra Gonzalez - 53 y.o. female MRN [id number]  Date of birth: 22-Apr-1971  Office Visit Note: Visit Date: 09/01/2023 PCP: Zilphia Hilt, Charyl Coppersmith, MD Referred by: Zilphia Hilt, Estel*  Subjective:  HPI: Kendra Gonzalez is a 53 y.o. female who presents today for follow up 6 weeks status post right wrist first extensor compartment release.  She is doing very well overall, is progressing with the activities as tolerated.  Has been released from occupational therapy.  Pertinent ROS were reviewed with the patient and found to be negative unless otherwise specified above in HPI.   Assessment & Plan: Visit Diagnoses:  1. De Quervain's syndrome (tenosynovitis)     Plan: She is doing very well overall, continue with activities as tolerated.  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  Return in 6 weeks for recheck, and likely clearance for full activities.  Follow-up: No follow-ups on file.   Meds & Orders: No orders of the defined types were placed in this encounter.  No orders of the defined types were placed in this encounter.    Procedures: No procedures performed       Objective:   Vital Signs: LMP 06/06/1996 Comment: GYN  Ortho Exam Right wrist: - Well-healed incision at the glabrous/nonglabrous juncture over the radial aspect of the wrist - Thumb opposition to the small finger DPC - Thumb circumduction without significant pain or crepitus - Hand is warm well-perfused, sensation intact in all distributions including DRSN - Thumb pinch strength right 12, left 15  Imaging: No results found.   Keanon Bevins Alvia Jointer, M.D. [facility], Hand Surgery

## 2023-09-02 ENCOUNTER — Encounter: Admitting: Orthopedic Surgery

## 2023-09-06 ENCOUNTER — Ambulatory Visit: Payer: 59 | Admitting: Internal Medicine

## 2023-09-22 ENCOUNTER — Ambulatory Visit: Admitting: Internal Medicine

## 2023-09-22 ENCOUNTER — Encounter: Payer: Self-pay | Admitting: Internal Medicine

## 2023-09-22 VITALS — BP 120/80 | HR 90 | Ht 65.0 in | Wt 209.0 lb

## 2023-09-22 DIAGNOSIS — E890 Postprocedural hypothyroidism: Secondary | ICD-10-CM | POA: Diagnosis not present

## 2023-09-22 LAB — TSH: TSH: <0.01 m[IU]/L — ABNORMAL LOW

## 2023-09-22 LAB — T4, FREE: Free T4: 1.7 ng/dL (ref 0.8–1.8)

## 2023-09-22 NOTE — Progress Notes (Unsigned)
 Name: Kendra Gonzalez  MRN/ DOB: [id number], 05/22/1970    Age/ Sex: 53 y.o., female     PCP: Zilphia Hilt, Charyl Coppersmith, MD   Reason for Endocrinology Evaluation: Hypothyriodism     Initial Endocrinology Clinic Visit: 04/16/2015    PATIENT IDENTIFIER: Kendra Gonzalez is a 53 y.o., female with a past medical history of hypothyroididism. She has followed with [facility] since 04/16/2015 for consultative assistance with management of her hypothyroidism.   HISTORICAL SUMMARY: The patient was first diagnosed with MNG and hyperthyroidism in 2016 requiring methimazole  intake.   She is S/P total thyroidectomy 10/2020 with benign pathology ( pt declined RAI ablation)   She was followed by Dr. 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  Pt has been noted with weight gain  No local necks swelling  Denies palpitations  Denies  tremors  Denies  diarrhea , has occasional constipation  Has noted hair thinning      Levothyroxine  125 mcg daily   HISTORY:  Past Medical History:  Past Medical History:  Diagnosis Date   Allergy    Arthritis    Asthma    Cataract    bilateral eyes   Endometriosis    GERD (gastroesophageal reflux disease)    Headache    migraines   History of tetanus, diphtheria, and acellular pertussis booster vaccination (Tdap)    07/30/2010   Hyperlipidemia    Kidney stones    Pancreatitis    Past Surgical History:  Past Surgical History:  Procedure Laterality Date   ABDOMINAL HYSTERECTOMY     arthroscopic knee surgery      carpel tunnel     CHOLECYSTECTOMY     ERCP  01/08/2012   Procedure: ENDOSCOPIC RETROGRADE CHOLANGIOPANCREATOGRAPHY (ERCP);  Surgeon: Almeda Aris, MD;  Location: [facility] END[facility];  Service: Endoscopy;  Laterality: N/A;   HIP ARTHROPLASTY     left   HIP SURGERY     oophrectomy  2011   secondary to cysts    THYROIDECTOMY N/A 11/21/2020   Procedure: TOTAL THYROIDECTOMY;  Surgeon: Oralee Billow, MD;  Location: [facility] ORS;  Service: General;  Laterality: N/A;   TUBAL LIGATION     UPPER GASTROINTESTINAL ENDOSCOPY  2016   Social History:  reports that she has never smoked. She has never used smokeless tobacco. She reports that she does not drink alcohol and does not use drugs. Family History:  Family History  Problem Relation Age of Onset   Hypertension Father    Hypertension Sister    Hypertension Sister    Heart disease Daughter    Hypertension Maternal Grandfather    Heart disease Maternal Grandfather    Hypertension Paternal Grandfather    Hypertension Brother    Hypertension Brother    Thyroid  disease Neg Hx    Colon polyps Neg Hx    Colon cancer Neg Hx    Esophageal cancer Neg Hx    Rectal cancer Neg Hx    Stomach cancer Neg Hx    Breast cancer Neg Hx      HOME MEDICATIONS: Allergies as of 09/22/2023       Reactions   Hydromorphone  Itching        Medication List        Accurate as of Sep 22, 2023  1:11 PM. If you have any questions, ask your nurse or doctor.  acetaminophen  500 MG tablet Commonly known as: TYLENOL  Take 1,000 mg by mouth every 8 (eight) hours as needed for moderate pain.   acetaminophen -codeine  300-30 MG tablet Commonly known as: TYLENOL  #3 Take 1 tablet by mouth every 6 (six) hours as needed for moderate pain (pain score 4-6).   Advair  HFA 115-21 MCG/ACT inhaler Generic drug: fluticasone -salmeterol Inhale 2 puffs into the lungs 2 (two) times daily.   fluticasone -salmeterol 100-50 MCG/ACT Aepb Commonly known as: ADVAIR  Inhale 1 puff into the lungs 2 (two) times daily.   albuterol  108 (90 Base) MCG/ACT inhaler Commonly known as: VENTOLIN  HFA Inhale 2 puffs into the lungs every 6 (six) hours as needed for wheezing or shortness of breath.   albuterol  108 (90 Base) MCG/ACT inhaler Commonly known as: VENTOLIN  HFA Inhale 2 puffs into the  lungs every 6 (six) hours as needed for wheezing or shortness of breath.   albuterol  (2.5 MG/3ML) 0.083% nebulizer solution Commonly known as: PROVENTIL  Take 3 mLs (2.5 mg total) by nebulization every 6 (six) hours as needed for wheezing or shortness of breath.   atorvastatin  40 MG tablet Commonly known as: LIPITOR Take 1 tablet (40 mg total) by mouth daily.   azelastine  0.1 % nasal spray Commonly known as: ASTELIN  Place 1 spray into both nostrils 2 (two) times daily. Use in each nostril as directed   baclofen  10 MG tablet Commonly known as: LIORESAL  Take 1 tablet (10 mg total) by mouth 2 (two) times daily as needed (migraines).   BD SafetyGlide Syringe/Needle 25G X 1" 3 ML Misc Generic drug: SYRINGE-NEEDLE (DISP) 3 ML Use as directed   cyanocobalamin  1000 MCG/ML injection Commonly known as: VITAMIN B12 Inject 1 ml into the muscle once a week for a month. Then injected 1 ml once a month thereafter.   eletriptan  40 MG tablet Commonly known as: RELPAX  Take 1 tablet (40 mg total) by mouth as needed for migraine or headache. May repeat in 2 hours if headache persists or recurs.   Emgality  120 MG/ML Soaj Generic drug: Galcanezumab -gnlm Inject 120 mg into the skin every 28 (twenty-eight) days.   gabapentin  300 MG capsule Commonly known as: NEURONTIN  Take 1 capsule (300 mg total) by mouth 3 (three) times daily.   levothyroxine  125 MCG tablet Commonly known as: SYNTHROID  Take 1 tablet (125 mcg total) by mouth daily.   Magnesium  Gluconate 550 MG Tabs TAKE 1 TABLET BY MOUTH AT BEDTIME.   meloxicam  7.5 MG tablet Commonly known as: MOBIC  Take 1 tablet (7.5 mg total) by mouth daily.   methylPREDNISolone  4 MG Tbpk tablet Commonly known as: MEDROL  DOSEPAK Take as directed with food   montelukast  10 MG tablet Commonly known as: SINGULAIR  Take 1 tablet (10 mg total) by mouth at bedtime.   ondansetron  4 MG tablet Commonly known as: ZOFRAN  Take 1 tablet (4 mg total) by mouth  every 6 (six) hours.   SUMAtriptan  20 MG/ACT nasal spray Commonly known as: IMITREX  Place 1 spray (20 mg total) into the nose as needed for migraine or headache. May repeat in 2 hours if headache persists or recurs.  Maximum 2 sprays in 24 hours.   valsartan -hydrochlorothiazide  160-25 MG tablet Commonly known as: DIOVAN -HCT Take 1 tablet by mouth daily.   Vitamin D  (Ergocalciferol ) 1.25 MG (50000 UNIT) Caps capsule Commonly known as: DRISDOL  Take 1 capsule (50,000 Units total) by mouth every 7 (seven) days for 12 doses.          OBJECTIVE:   PHYSICAL EXAM: VS: BP 120/80 (  BP Location: Left Arm, Patient Position: Sitting, Cuff Size: Normal)   Pulse 90   Ht 5\' 5"  (1.651 m)   Wt 209 lb (94.8 kg)   LMP 06/06/1996 Comment: GYN  SpO2 98%   BMI 34.78 kg/m    EXAM: General: Pt appears well and is in NAD  Eyes: External eye exam normal without stare, lid lag or exophthalmos.   Neck: General: Supple without adenopathy. Thyroid : Thyroid  size normal.  No goiter or nodules appreciated.   Lungs: Clear with good BS bilat   Heart: Auscultation: RRR.  Extremities:  BL LE: No pretibial edema   Mental Status: Judgment, insight: Intact Orientation: Oriented to time, place, and person Mood and affect: No depression, anxiety, or agitation     DATA REVIEWED:  ****  ASSESSMENT / PLAN / RECOMMENDATIONS:   Postoperative Hypothyroidism:  - She is S/P total thyroidectomy 10/2020 with benign pathology and secondary to hyperthyroidism  - Pt is clinically euthyroid     Medications   Continue levothyroxine  125 mcg daily    Signed electronically by: Natale Bail, MD  [facility]   [facility] [address] [place] [place], [place] [address] Phone: [phone] FAX: [phone]      CC: Zilphia Hilt, Charyl Coppersmith, MD [address] [place] [address] Phone: [phone]  Fax: [phone]   Return to Endocrinology clinic  as below: Future Appointments  Date Time Provider Department Center  10/12/2023  8:30 AM Merrill Abide, MD [facility] None  01/17/2024  8:50 AM Merriam Abbey, DO [facility] None

## 2023-09-23 ENCOUNTER — Ambulatory Visit: Payer: Self-pay | Admitting: Internal Medicine

## 2023-09-23 ENCOUNTER — Telehealth: Payer: Self-pay | Admitting: Internal Medicine

## 2023-09-23 MED ORDER — LEVOTHYROXINE SODIUM 112 MCG PO TABS: 112.0000 ug | ORAL_TABLET | Freq: Every day | ORAL | 3 refills | Status: DC

## 2023-09-23 NOTE — Telephone Encounter (Signed)
 Please contact the patient and schedule her for a lab appointment only in 2 months.  A portal message was sent to her   Thanks

## 2023-10-12 ENCOUNTER — Encounter: Admitting: Orthopedic Surgery

## 2023-10-28 ENCOUNTER — Other Ambulatory Visit

## 2023-10-28 LAB — T4, FREE: Free T4: 1.9 ng/dL — ABNORMAL HIGH (ref 0.8–1.8)

## 2023-10-28 LAB — TSH: TSH: <0.01 m[IU]/L — ABNORMAL LOW

## 2023-11-01 NOTE — Telephone Encounter (Signed)
 Please let the patient know that her thyroid  levels continue to show that she is on too much levothyroxine .   I will suggest taking levothyroxine  112 mcg SIX days out of the weeK from now on   Please schedule her to be seen with an office visit in 2 months   Please make sure that the patient is not taking any biotin or anything this is good for hair skin and nails   Thanks

## 2023-11-19 ENCOUNTER — Ambulatory Visit: Admitting: Internal Medicine

## 2023-12-03 ENCOUNTER — Telehealth: Payer: Self-pay | Admitting: Internal Medicine

## 2023-12-03 NOTE — Telephone Encounter (Signed)
 Patient dropped off paperwork. Paperwork in folder

## 2023-12-07 ENCOUNTER — Encounter: Payer: Self-pay | Admitting: Internal Medicine

## 2023-12-07 NOTE — Telephone Encounter (Signed)
 In red folder.

## 2023-12-13 ENCOUNTER — Telehealth: Payer: Self-pay | Admitting: *Deleted

## 2023-12-13 ENCOUNTER — Encounter: Payer: Self-pay | Admitting: Internal Medicine

## 2023-12-13 DIAGNOSIS — E782 Mixed hyperlipidemia: Secondary | ICD-10-CM

## 2023-12-13 NOTE — Telephone Encounter (Signed)
 Attempted to call the patient, but voicemail box is full.  Lab ordered.

## 2023-12-13 NOTE — Telephone Encounter (Signed)
 Copied from CRM #[id number]. Topic: Clinical - Request for Lab/Test Order >> Dec 10, 2023 10:44 AM [place] J wrote: Reason for CRM: Patient would like orders to recheck her cholesterol.  *Patient is aware of the same day turn around time and will be looking out for a phone call with an update.*

## 2023-12-14 NOTE — Telephone Encounter (Signed)
 Lab appointment scheduled

## 2023-12-20 ENCOUNTER — Other Ambulatory Visit (INDEPENDENT_AMBULATORY_CARE_PROVIDER_SITE_OTHER)

## 2023-12-20 DIAGNOSIS — E782 Mixed hyperlipidemia: Secondary | ICD-10-CM

## 2023-12-20 DIAGNOSIS — Z0279 Encounter for issue of other medical certificate: Secondary | ICD-10-CM

## 2023-12-20 LAB — LIPID PANEL
Cholesterol: 195 mg/dL (ref 0–200)
HDL: 71.1 mg/dL (ref >39.00)
LDL Cholesterol: 112 mg/dL — ABNORMAL HIGH (ref 0–99)
NonHDL: 124.33
Total CHOL/HDL Ratio: 3
Triglycerides: 63 mg/dL (ref 0.0–149.0)
VLDL: 12.6 mg/dL (ref 0.0–40.0)

## 2023-12-23 ENCOUNTER — Ambulatory Visit: Payer: Self-pay | Admitting: Internal Medicine

## 2023-12-23 DIAGNOSIS — E782 Mixed hyperlipidemia: Secondary | ICD-10-CM

## 2023-12-23 MED ORDER — ATORVASTATIN CALCIUM 40 MG PO TABS: 40.0000 mg | ORAL_TABLET | Freq: Every day | ORAL | 1 refills | Status: AC

## 2023-12-23 NOTE — Telephone Encounter (Signed)
Form completed, faxed, and confirmed

## 2023-12-23 NOTE — Telephone Encounter (Signed)
 Agent will let pt know  will be charge for FMLA form and she needs fup appointment

## 2023-12-28 ENCOUNTER — Encounter: Payer: Self-pay | Admitting: Internal Medicine

## 2023-12-28 ENCOUNTER — Ambulatory Visit: Admitting: Internal Medicine

## 2023-12-28 VITALS — BP 110/80 | HR 82 | Temp 97.9°F | Wt 206.7 lb

## 2023-12-28 DIAGNOSIS — R7302 Impaired glucose tolerance (oral): Secondary | ICD-10-CM

## 2023-12-28 DIAGNOSIS — J45909 Unspecified asthma, uncomplicated: Secondary | ICD-10-CM

## 2023-12-28 DIAGNOSIS — K219 Gastro-esophageal reflux disease without esophagitis: Secondary | ICD-10-CM

## 2023-12-28 DIAGNOSIS — E538 Deficiency of other specified B group vitamins: Secondary | ICD-10-CM

## 2023-12-28 DIAGNOSIS — E785 Hyperlipidemia, unspecified: Secondary | ICD-10-CM

## 2023-12-28 DIAGNOSIS — E890 Postprocedural hypothyroidism: Secondary | ICD-10-CM

## 2023-12-28 DIAGNOSIS — E559 Vitamin D deficiency, unspecified: Secondary | ICD-10-CM

## 2023-12-28 LAB — POCT GLYCOSYLATED HEMOGLOBIN (HGB A1C): Hemoglobin A1C: 5.9 % — AB (ref 4.0–5.6)

## 2023-12-28 NOTE — Assessment & Plan Note (Signed)
Check labs next visit 

## 2023-12-28 NOTE — Assessment & Plan Note (Signed)
 A1c remains stable in the prediabetic range at 5.9.

## 2023-12-28 NOTE — Assessment & Plan Note (Signed)
 Fair control on atorvastatin .

## 2023-12-28 NOTE — Assessment & Plan Note (Signed)
Appears well controlled today. Lungs CTA, pt without respiratory complaints. Continue home meds as previously ordered.

## 2023-12-28 NOTE — Assessment & Plan Note (Signed)
 Last TSH demonstrated good control on current dose of levothyroxine .

## 2023-12-28 NOTE — Assessment & Plan Note (Signed)
 Well controlled

## 2023-12-28 NOTE — Progress Notes (Signed)
 Established Patient Office Visit     CC/Reason for Visit: Follow-up chronic conditions  HPI: Bellamie Turney is a 53 y.o. female who is coming in today for the above mentioned reasons. Past Medical History is significant for: Impaired glucose tolerance, asthma, migraine headaches, vitamin D  and B12 deficiencies, hypothyroidism, hypertension.  She has been feeling well.  No major concerns or complaints.   Past Medical/Surgical History: Past Medical History:  Diagnosis Date   Allergy    Arthritis    Asthma    Cataract    bilateral eyes   Endometriosis    GERD (gastroesophageal reflux disease)    Headache    migraines   History of tetanus, diphtheria, and acellular pertussis booster vaccination (Tdap)    07/30/2010   Hyperlipidemia    Kidney stones    Pancreatitis     Past Surgical History:  Procedure Laterality Date   ABDOMINAL HYSTERECTOMY     arthroscopic knee surgery      carpel tunnel     CHOLECYSTECTOMY     ERCP  01/08/2012   Procedure: ENDOSCOPIC RETROGRADE CHOLANGIOPANCREATOGRAPHY (ERCP);  Surgeon: Belvie JONETTA Just, MD;  Location: [facility] ENDOSCOPY;  Service: Endoscopy;  Laterality: N/A;   HIP ARTHROPLASTY     left   HIP SURGERY     oophrectomy  2011   secondary to cysts   THYROIDECTOMY N/A 11/21/2020   Procedure: TOTAL THYROIDECTOMY;  Surgeon: Eletha Boas, MD;  Location: [facility] ORS;  Service: General;  Laterality: N/A;   TUBAL LIGATION     UPPER GASTROINTESTINAL ENDOSCOPY  2016    Social History:  reports that she has never smoked. She has never used smokeless tobacco. She reports that she does not drink alcohol and does not use drugs.  Allergies: Allergies  Allergen Reactions   Hydromorphone  Itching    Family History:  Family History  Problem Relation Age of Onset   Hypertension Father    Hypertension Sister    Hypertension Sister    Heart disease Daughter    Hypertension Maternal Grandfather    Heart disease Maternal Grandfather     Hypertension Paternal Grandfather    Hypertension Brother    Hypertension Brother    Thyroid  disease Neg Hx    Colon polyps Neg Hx    Colon cancer Neg Hx    Esophageal cancer Neg Hx    Rectal cancer Neg Hx    Stomach cancer Neg Hx    Breast cancer Neg Hx      Current Outpatient Medications:    acetaminophen  (TYLENOL ) 500 MG tablet, Take 1,000 mg by mouth every 8 (eight) hours as needed for moderate pain., Disp: , Rfl:    acetaminophen -codeine  (TYLENOL  #3) 300-30 MG tablet, Take 1 tablet by mouth every 6 (six) hours as needed for moderate pain (pain score 4-6)., Disp: 26 tablet, Rfl: 0   albuterol  (PROVENTIL ) (2.5 MG/3ML) 0.083% nebulizer solution, Take 3 mLs (2.5 mg total) by nebulization every 6 (six) hours as needed for wheezing or shortness of breath., Disp: 60 mL, Rfl: 0   albuterol  (VENTOLIN  HFA) 108 (90 Base) MCG/ACT inhaler, Inhale 2 puffs into the lungs every 6 (six) hours as needed for wheezing or shortness of breath., Disp: 18 g, Rfl: 2   albuterol  (VENTOLIN  HFA) 108 (90 Base) MCG/ACT inhaler, Inhale 2 puffs into the lungs every 6 (six) hours as needed for wheezing or shortness of breath., Disp: 8.5 g, Rfl: 0   atorvastatin  (LIPITOR) 40 MG tablet, Take 1 tablet (  40 mg total) by mouth daily., Disp: 90 tablet, Rfl: 1   baclofen  (LIORESAL ) 10 MG tablet, Take 1 tablet (10 mg total) by mouth 2 (two) times daily as needed (migraines)., Disp: 90 each, Rfl: 1   cyanocobalamin  (VITAMIN B12) 1000 MCG/ML injection, Inject 1 ml into the muscle once a week for a month. Then injected 1 ml once a month thereafter., Disp: 6 mL, Rfl: 11   eletriptan  (RELPAX ) 40 MG tablet, Take 1 tablet (40 mg total) by mouth as needed for migraine or headache. May repeat in 2 hours if headache persists or recurs., Disp: 10 tablet, Rfl: 5   fluticasone -salmeterol (ADVAIR  HFA) 115-21 MCG/ACT inhaler, Inhale 2 puffs into the lungs 2 (two) times daily., Disp: 1 each, Rfl: 2   gabapentin  (NEURONTIN ) 300 MG capsule, Take  1 capsule (300 mg total) by mouth 3 (three) times daily., Disp: 90 capsule, Rfl: 0   levothyroxine  (SYNTHROID ) 112 MCG tablet, Take 1 tablet (112 mcg total) by mouth daily., Disp: 90 tablet, Rfl: 3   Magnesium  Gluconate 550 MG TABS, TAKE 1 TABLET BY MOUTH AT BEDTIME., Disp: 90 tablet, Rfl: 0   montelukast  (SINGULAIR ) 10 MG tablet, Take 1 tablet (10 mg total) by mouth at bedtime., Disp: 30 tablet, Rfl: 0   SUMAtriptan  (IMITREX ) 20 MG/ACT nasal spray, Place 1 spray (20 mg total) into the nose as needed for migraine or headache. May repeat in 2 hours if headache persists or recurs.  Maximum 2 sprays in 24 hours., Disp: 6 each, Rfl: 11   SYRINGE-NEEDLE, DISP, 3 ML (BD SAFETYGLIDE SYRINGE/NEEDLE) 25G X 1 3 ML MISC, Use as directed, Disp: 100 each, Rfl: 3   azelastine  (ASTELIN ) 0.1 % nasal spray, Place 1 spray into both nostrils 2 (two) times daily. Use in each nostril as directed, Disp: 30 mL, Rfl: 0   fluticasone -salmeterol (ADVAIR ) 100-50 MCG/ACT AEPB, Inhale 1 puff into the lungs 2 (two) times daily., Disp: 60 each, Rfl: 0   Galcanezumab -gnlm (EMGALITY ) 120 MG/ML SOAJ, Inject 120 mg into the skin every 28 (twenty-eight) days., Disp: 1 mL, Rfl: 11   meloxicam  (MOBIC ) 7.5 MG tablet, Take 1 tablet (7.5 mg total) by mouth daily., Disp: 30 tablet, Rfl: 0   methylPREDNISolone  (MEDROL  DOSEPAK) 4 MG TBPK tablet, Take as directed with food, Disp: 21 tablet, Rfl: 0   ondansetron  (ZOFRAN ) 4 MG tablet, Take 1 tablet (4 mg total) by mouth every 6 (six) hours., Disp: 20 tablet, Rfl: 11   valsartan -hydrochlorothiazide  (DIOVAN -HCT) 160-25 MG tablet, Take 1 tablet by mouth daily. (Patient not taking: Reported on 12/28/2023), Disp: 90 tablet, Rfl: 1  Review of Systems:  Negative unless indicated in HPI.   Physical Exam: Vitals:   12/28/23 1315  BP: 110/80  Pulse: 82  Temp: 97.9 F (36.6 C)  TempSrc: Oral  SpO2: 99%  Weight: 206 lb 11.2 oz (93.8 kg)    Body mass index is 34.4 kg/m.   Physical  Exam Vitals reviewed.  Constitutional:      Appearance: Normal appearance.  HENT:     Head: Normocephalic and atraumatic.  Eyes:     Conjunctiva/sclera: Conjunctivae normal.  Cardiovascular:     Rate and Rhythm: Normal rate and regular rhythm.  Pulmonary:     Effort: Pulmonary effort is normal.     Breath sounds: Normal breath sounds.  Skin:    General: Skin is warm and dry.  Neurological:     General: No focal deficit present.     Mental Status: She is  alert and oriented to person, place, and time.  Psychiatric:        Mood and Affect: Mood normal.        Behavior: Behavior normal.        Thought Content: Thought content normal.        Judgment: Judgment normal.      Impression and Plan:  IGT (impaired glucose tolerance) Assessment & Plan: A1c remains stable in the prediabetic range at 5.9.  Orders: -     POCT glycosylated hemoglobin (Hb A1C)  Vitamin B12 deficiency Assessment & Plan: Check labs next visit.   Hyperlipidemia, unspecified hyperlipidemia type Assessment & Plan: Fair control on atorvastatin .   Mild asthma without complication, unspecified whether persistent Assessment & Plan: Appears well controlled today. Lungs CTA, pt without respiratory complaints. Continue home meds as previously ordered.   Gastroesophageal reflux disease without esophagitis Assessment & Plan: Well-controlled.   Vitamin D  deficiency Assessment & Plan: Check labs next visit.   Postoperative hypothyroidism Assessment & Plan: Last TSH demonstrated good control on current dose of levothyroxine .      Time spent:31 minutes reviewing chart, interviewing and examining patient and formulating plan of care.     Tully Theophilus Andrews, MD [facility]

## 2024-01-03 ENCOUNTER — Other Ambulatory Visit

## 2024-01-05 ENCOUNTER — Other Ambulatory Visit

## 2024-01-05 ENCOUNTER — Other Ambulatory Visit: Payer: Self-pay

## 2024-01-05 ENCOUNTER — Other Ambulatory Visit: Payer: Self-pay | Admitting: Internal Medicine

## 2024-01-05 DIAGNOSIS — E890 Postprocedural hypothyroidism: Secondary | ICD-10-CM

## 2024-01-05 LAB — T4, FREE: Free T4: 1.5 ng/dL (ref 0.8–1.8)

## 2024-01-05 LAB — TSH: TSH: 0.19 m[IU]/L — ABNORMAL LOW

## 2024-01-06 ENCOUNTER — Ambulatory Visit: Payer: Self-pay | Admitting: Internal Medicine

## 2024-01-06 MED ORDER — LEVOTHYROXINE SODIUM 88 MCG PO TABS: 88.0000 ug | ORAL_TABLET | Freq: Every day | ORAL | 3 refills | Status: DC

## 2024-01-06 NOTE — Addendum Note (Signed)
 Addended by: SAM DONELL PARAS on: 01/06/2024 01:34 PM   Modules accepted: Orders

## 2024-01-07 ENCOUNTER — Other Ambulatory Visit (HOSPITAL_COMMUNITY): Payer: Self-pay

## 2024-01-07 ENCOUNTER — Telehealth: Admitting: Physician Assistant

## 2024-01-07 DIAGNOSIS — J4531 Mild persistent asthma with (acute) exacerbation: Secondary | ICD-10-CM

## 2024-01-07 MED ORDER — PROMETHAZINE-DM 6.25-15 MG/5ML PO SYRP
5.0000 mL | ORAL_SOLUTION | Freq: Four times a day (QID) | ORAL | 0 refills | Status: DC | PRN
  Filled 2024-01-07: qty 118, 6d supply, fill #0

## 2024-01-07 MED ORDER — ALBUTEROL SULFATE HFA 108 (90 BASE) MCG/ACT IN AERS
1.0000 | INHALATION_SPRAY | Freq: Four times a day (QID) | RESPIRATORY_TRACT | 0 refills | Status: DC | PRN
  Filled 2024-01-07: qty 6.7, 25d supply, fill #0

## 2024-01-07 MED ORDER — PREDNISONE 20 MG PO TABS
40.0000 mg | ORAL_TABLET | Freq: Every day | ORAL | 0 refills | Status: DC
  Filled 2024-01-07: qty 14, 7d supply, fill #0

## 2024-01-07 NOTE — Progress Notes (Signed)
 Virtual Visit Consent   Kendra Gonzalez, you are scheduled for a virtual visit with a [facility] provider today. Just as with appointments in the office, your consent must be obtained to participate. Your consent will be active for this visit and any virtual visit you may have with one of our providers in the next 365 days. If you have a MyChart account, a copy of this consent can be sent to you electronically.  As this is a virtual visit, video technology does not allow for your provider to perform a traditional examination. This may limit your provider's ability to fully assess your condition. If your provider identifies any concerns that need to be evaluated in person or the need to arrange testing (such as labs, EKG, etc.), we will make arrangements to do so. Although advances in technology are sophisticated, we cannot ensure that it will always work on either your end or our end. If the connection with a video visit is poor, the visit may have to be switched to a telephone visit. With either a video or telephone visit, we are not always able to ensure that we have a secure connection.  By engaging in this virtual visit, you consent to the provision of healthcare and authorize for your insurance to be billed (if applicable) for the services provided during this visit. Depending on your insurance coverage, you may receive a charge related to this service.  I need to obtain your verbal consent now. Are you willing to proceed with your visit today? Kendra Gonzalez has provided verbal consent on 01/07/2024 for a virtual visit (video or telephone). Kendra [name], PA-C  Date: 01/07/2024 12:00 PM   Virtual Visit via Video Note   I, Kendra Gonzalez, connected with  Kendra Gonzalez  ([id number], [birth year]) on 01/07/24 at 12:00 PM EDT by a video-enabled telemedicine application and verified that I am speaking with the correct person using two  identifiers.  Location: Patient: Virtual Visit Location Patient: Home Provider: Virtual Visit Location Provider: Home Office   I discussed the limitations of evaluation and management by telemedicine and the availability of in person appointments. The patient expressed understanding and agreed to proceed.    History of Present Illness: Kendra Gonzalez is a 53 y.o. who identifies as a female who was assigned female at birth, and is being seen today for cough.  HPI: Cough This is a new problem. The current episode started in the past 7 days (4 days). The problem has been unchanged. The problem occurs every few minutes. The cough is Non-productive. Associated symptoms include headaches and a sore throat (scratchy, now improved). Pertinent negatives include no chills, ear congestion, ear pain, fever, myalgias, nasal congestion, postnasal drip, rhinorrhea, shortness of breath or wheezing. The symptoms are aggravated by lying down (talking). Treatments tried: Tessalon  perles. The treatment provided no relief. Her past medical history is significant for asthma and bronchitis.     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   Unilateral primary osteoarthritis, left knee 07/16/2020   Presence of left artificial hip joint 07/09/2017   Multinodular goiter 04/17/2015   History of stomach ulcers 03/22/2015   Chronic pain of left heel  04/23/2014   Chronic pain syndrome 03/19/2014   Asthma 01/04/2013   BMI 29.0-29.9,adult 10/12/2011   Paroxysmal nerve pain 01/22/2011   Hyperlipidemia 02/16/2007   Allergic rhinitis 02/16/2007   GERD 02/16/2007    Allergies:  Allergies  Allergen  Reactions   Hydromorphone  Itching   Medications:  Current Outpatient Medications:    albuterol  (VENTOLIN  HFA) 108 (90 Base) MCG/ACT inhaler, Inhale 1-2 puffs into the lungs every 6 (six) hours as needed., Disp: 8 g, Rfl: 0   predniSONE  (DELTASONE ) 20 MG tablet, Take 2 tablets (40 mg total) by mouth daily with breakfast., Disp: 14 tablet, Rfl: 0   promethazine -dextromethorphan (PROMETHAZINE -DM) 6.25-15 MG/5ML syrup, Take 5 mLs by mouth 4 (four) times daily as needed., Disp: 118 mL, Rfl: 0   acetaminophen  (TYLENOL ) 500 MG tablet, Take 1,000 mg by mouth every 8 (eight) hours as needed for moderate pain., Disp: , Rfl:    acetaminophen -codeine  (TYLENOL  #3) 300-30 MG tablet, Take 1 tablet by mouth every 6 (six) hours as needed for moderate pain (pain score 4-6)., Disp: 26 tablet, Rfl: 0   atorvastatin  (LIPITOR) 40 MG tablet, Take 1 tablet (40 mg total) by mouth daily., Disp: 90 tablet, Rfl: 1   azelastine  (ASTELIN ) 0.1 % nasal spray, Place 1 spray into both nostrils 2 (two) times daily. Use in each nostril as directed, Disp: 30 mL, Rfl: 0   baclofen  (LIORESAL ) 10 MG tablet, Take 1 tablet (10 mg total) by mouth 2 (two) times daily as needed (migraines)., Disp: 90 each, Rfl: 1   cyanocobalamin  (VITAMIN B12) 1000 MCG/ML injection, Inject 1 ml into the muscle once a week for a month. Then injected 1 ml once a month thereafter., Disp: 6 mL, Rfl: 11   eletriptan  (RELPAX ) 40 MG tablet, Take 1 tablet (40 mg total) by mouth as needed for migraine or headache. May repeat in 2 hours if headache persists or recurs., Disp: 10 tablet, Rfl: 5   fluticasone -salmeterol (ADVAIR  HFA) 115-21 MCG/ACT inhaler, Inhale 2 puffs into the lungs 2 (two) times daily., Disp: 1 each, Rfl: 2   fluticasone -salmeterol (ADVAIR ) 100-50 MCG/ACT AEPB, Inhale 1 puff into the lungs 2 (two) times daily., Disp: 60 each, Rfl: 0   gabapentin  (NEURONTIN ) 300 MG capsule, Take 1 capsule (300 mg total) by mouth 3 (three) times daily., Disp: 90 capsule,  Rfl: 0   Galcanezumab -gnlm (EMGALITY ) 120 MG/ML SOAJ, Inject 120 mg into the skin every 28 (twenty-eight) days., Disp: 1 mL, Rfl: 11   levothyroxine  (SYNTHROID ) 88 MCG tablet, Take 1 tablet (88 mcg total) by mouth daily., Disp: 90 tablet, Rfl: 3   Magnesium  Gluconate 550 MG TABS, TAKE 1 TABLET BY MOUTH AT BEDTIME., Disp: 90 tablet, Rfl: 0   meloxicam  (MOBIC ) 7.5 MG tablet, Take 1 tablet (7.5 mg total) by mouth daily., Disp: 30 tablet, Rfl: 0   montelukast  (SINGULAIR ) 10 MG tablet, Take 1 tablet (10 mg total) by mouth at bedtime., Disp: 30 tablet, Rfl: 0   ondansetron  (ZOFRAN ) 4 MG tablet, Take 1 tablet (4 mg total) by mouth every 6 (six) hours., Disp: 20 tablet, Rfl: 11   SUMAtriptan  (IMITREX ) 20 MG/ACT nasal spray, Place 1 spray (20 mg total) into the nose as needed for migraine or headache. May repeat in 2 hours if headache persists or recurs.  Maximum 2 sprays in 24 hours., Disp: 6 each, Rfl: 11   SYRINGE-NEEDLE, DISP, 3 ML (BD SAFETYGLIDE SYRINGE/NEEDLE) 25G X 1 3 ML MISC, Use as directed, Disp: 100 each, Rfl: 3  valsartan -hydrochlorothiazide  (DIOVAN -HCT) 160-25 MG tablet, Take 1 tablet by mouth daily. (Patient not taking: Reported on 12/28/2023), Disp: 90 tablet, Rfl: 1  Observations/Objective: Patient is well-developed, well-nourished in no acute distress.  Resting comfortably at home.  Head is normocephalic, atraumatic.  No labored breathing.  Speech is clear and coherent with logical content.  Patient is alert and oriented at baseline.    Assessment and Plan: 1. Mild persistent asthma with exacerbation (Primary) - promethazine -dextromethorphan (PROMETHAZINE -DM) 6.25-15 MG/5ML syrup; Take 5 mLs by mouth 4 (four) times daily as needed.  Dispense: 118 mL; Refill: 0 - predniSONE  (DELTASONE ) 20 MG tablet; Take 2 tablets (40 mg total) by mouth daily with breakfast.  Dispense: 14 tablet; Refill: 0 - albuterol  (VENTOLIN  HFA) 108 (90 Base) MCG/ACT inhaler; Inhale 1-2 puffs into the lungs every  6 (six) hours as needed.  Dispense: 8 g; Refill: 0  - Worsening cough and chest congestion - Will treat with Prednisone , Albuterol  for asthma exacerbation - Promethazine  DM for cough - Push fluids.  - Rest.  - Steam and humidifier can help - Seek in person evaluation if worsening or symptoms fail to improve    Follow Up Instructions: I discussed the assessment and treatment plan with the patient. The patient was provided an opportunity to ask questions and all were answered. The patient agreed with the plan and demonstrated an understanding of the instructions.  A copy of instructions were sent to the patient via MyChart unless otherwise noted below.    The patient was advised to call back or seek an in-person evaluation if the symptoms worsen or if the condition fails to improve as anticipated.    Kendra [name], PA-C

## 2024-01-07 NOTE — Patient Instructions (Signed)
 Kendra Gonzalez, thank you for joining [facility] Kendra Dickinson, PA-C for today's virtual visit.  While this provider is not your primary care provider (PCP), if your PCP is located in our provider database this encounter information will be shared with them immediately following your visit.   A [facility] MyChart account gives you access to today's visit and all your visits, tests, and labs performed at [facility]  click here if you don't have a [facility] MyChart account or go to [email or web address]  Consent: (Patient) Kendra Gonzalez provided verbal consent for this virtual visit at the beginning of the encounter.  Current Medications:  Current Outpatient Medications:    albuterol  (VENTOLIN  HFA) 108 (90 Base) MCG/ACT inhaler, Inhale 1-2 puffs into the lungs every 6 (six) hours as needed., Disp: 8 g, Rfl: 0   predniSONE  (DELTASONE ) 20 MG tablet, Take 2 tablets (40 mg total) by mouth daily with breakfast., Disp: 14 tablet, Rfl: 0   promethazine -dextromethorphan (PROMETHAZINE -DM) 6.25-15 MG/5ML syrup, Take 5 mLs by mouth 4 (four) times daily as needed., Disp: 118 mL, Rfl: 0   acetaminophen  (TYLENOL ) 500 MG tablet, Take 1,000 mg by mouth every 8 (eight) hours as needed for moderate pain., Disp: , Rfl:    acetaminophen -codeine  (TYLENOL  #3) 300-30 MG tablet, Take 1 tablet by mouth every 6 (six) hours as needed for moderate pain (pain score 4-6)., Disp: 26 tablet, Rfl: 0   atorvastatin  (LIPITOR) 40 MG tablet, Take 1 tablet (40 mg total) by mouth daily., Disp: 90 tablet, Rfl: 1   azelastine  (ASTELIN ) 0.1 % nasal spray, Place 1 spray into both nostrils 2 (two) times daily. Use in each nostril as directed, Disp: 30 mL, Rfl: 0   baclofen  (LIORESAL ) 10 MG tablet, Take 1 tablet (10 mg total) by mouth 2 (two) times daily as needed (migraines)., Disp: 90 each, Rfl: 1   cyanocobalamin  (VITAMIN B12) 1000 MCG/ML injection, Inject 1 ml into the muscle once a week for a month.  Then injected 1 ml once a month thereafter., Disp: 6 mL, Rfl: 11   eletriptan  (RELPAX ) 40 MG tablet, Take 1 tablet (40 mg total) by mouth as needed for migraine or headache. May repeat in 2 hours if headache persists or recurs., Disp: 10 tablet, Rfl: 5   fluticasone -salmeterol (ADVAIR  HFA) 115-21 MCG/ACT inhaler, Inhale 2 puffs into the lungs 2 (two) times daily., Disp: 1 each, Rfl: 2   fluticasone -salmeterol (ADVAIR ) 100-50 MCG/ACT AEPB, Inhale 1 puff into the lungs 2 (two) times daily., Disp: 60 each, Rfl: 0   gabapentin  (NEURONTIN ) 300 MG capsule, Take 1 capsule (300 mg total) by mouth 3 (three) times daily., Disp: 90 capsule, Rfl: 0   Galcanezumab -gnlm (EMGALITY ) 120 MG/ML SOAJ, Inject 120 mg into the skin every 28 (twenty-eight) days., Disp: 1 mL, Rfl: 11   levothyroxine  (SYNTHROID ) 88 MCG tablet, Take 1 tablet (88 mcg total) by mouth daily., Disp: 90 tablet, Rfl: 3   Magnesium  Gluconate 550 MG TABS, TAKE 1 TABLET BY MOUTH AT BEDTIME., Disp: 90 tablet, Rfl: 0   meloxicam  (MOBIC ) 7.5 MG tablet, Take 1 tablet (7.5 mg total) by mouth daily., Disp: 30 tablet, Rfl: 0   montelukast  (SINGULAIR ) 10 MG tablet, Take 1 tablet (10 mg total) by mouth at bedtime., Disp: 30 tablet, Rfl: 0   ondansetron  (ZOFRAN ) 4 MG tablet, Take 1 tablet (4 mg total) by mouth every 6 (six) hours., Disp: 20 tablet, Rfl: 11   SUMAtriptan  (IMITREX ) 20 MG/ACT nasal spray, Place 1 spray (20 mg  total) into the nose as needed for migraine or headache. May repeat in 2 hours if headache persists or recurs.  Maximum 2 sprays in 24 hours., Disp: 6 each, Rfl: 11   SYRINGE-NEEDLE, DISP, 3 ML (BD SAFETYGLIDE SYRINGE/NEEDLE) 25G X 1 3 ML MISC, Use as directed, Disp: 100 each, Rfl: 3   valsartan -hydrochlorothiazide  (DIOVAN -HCT) 160-25 MG tablet, Take 1 tablet by mouth daily. (Patient not taking: Reported on 12/28/2023), Disp: 90 tablet, Rfl: 1   Medications ordered in this encounter:  Meds ordered this encounter  Medications    promethazine -dextromethorphan (PROMETHAZINE -DM) 6.25-15 MG/5ML syrup    Sig: Take 5 mLs by mouth 4 (four) times daily as needed.    Dispense:  118 mL    Refill:  0    Supervising Provider:   BLAISE ALEENE KIDD [[id number]]   predniSONE  (DELTASONE ) 20 MG tablet    Sig: Take 2 tablets (40 mg total) by mouth daily with breakfast.    Dispense:  14 tablet    Refill:  0    Supervising Provider:   BLAISE ALEENE KIDD [[id number]]   albuterol  (VENTOLIN  HFA) 108 (90 Base) MCG/ACT inhaler    Sig: Inhale 1-2 puffs into the lungs every 6 (six) hours as needed.    Dispense:  8 g    Refill:  0    Supervising Provider:   BLAISE ALEENE KIDD [[id number]]     *If you need refills on other medications prior to your next appointment, please contact your pharmacy*  Follow-Up: Call back or seek an in-person evaluation if the symptoms worsen or if the condition fails to improve as anticipated.  [facility] [phone]  Other Instructions Asthma, Adult  Asthma is a long-term (chronic) condition that causes recurrent episodes in which the lower airways in the lungs become tight and narrow. The narrowing is caused by inflammation and tightening of the smooth muscle around the lower airways. Asthma episodes, also called asthma attacks or asthma flares, may cause coughing, making high-pitched whistling sounds when you breathe, most often when you breathe out (wheezing), shortness of breath, and chest pain. The airways may produce extra mucus caused by the inflammation and irritation. During an attack, it can be difficult to breathe. Asthma attacks can range from minor to life-threatening. Asthma cannot be cured, but medicines and lifestyle changes can help control it and treat acute attacks. It is important to keep your asthma well controlled so the condition does not interfere with your daily life. What are the causes? This condition is believed to be caused by inherited (genetic) and environmental factors,  but its exact cause is not known. What can trigger an asthma attack? Many things can bring on an asthma attack or make symptoms worse. These triggers are different for every person. Common triggers include: Allergens and irritants like mold, dust, pet dander, cockroaches, pollen, air pollution, and chemical odors. Cigarette smoke. Weather changes and cold air. Stress and strong emotional responses such as crying or laughing hard. Certain medications such as aspirin or beta blockers. Infections and inflammatory conditions, such as the flu, a cold, pneumonia, or inflammation of the nasal membranes (rhinitis). Gastroesophageal reflux disease (GERD). What are the signs or symptoms? Symptoms may occur right after exposure to an asthma trigger or hours later and can vary by person. Common signs and symptoms include: Wheezing. Trouble breathing (shortness of breath). Excessive nighttime or early morning coughing. Chest tightness. Tiredness (fatigue) with minimal activity. Difficulty talking in complete sentences. Poor exercise tolerance. How is  this diagnosed? This condition is diagnosed based on: A physical exam and your medical history. Tests, which may include: Lung function studies to evaluate the flow of air in your lungs. Allergy tests. Imaging tests, such as X-rays. How is this treated? There is no cure, but symptoms can be controlled with proper treatment. Treatment usually involves: Identifying and avoiding your asthma triggers. Inhaled medicines. Two types are commonly used to treat asthma, depending on severity: Controller medicines. These help prevent asthma symptoms from occurring. They are taken every day. Fast-acting reliever or rescue medicines. These quickly relieve asthma symptoms. They are used as needed and provide short-term relief. Using other medicines, such as: Allergy medicines, such as antihistamines, if your asthma attacks are triggered by allergens. Immune  medicines (immunomodulators). These are medicines that help control the immune system. Using supplemental oxygen. This is only needed during a severe episode. Creating an asthma action plan. An asthma action plan is a written plan for managing and treating your asthma attacks. This plan includes: A list of your asthma triggers and how to avoid them. Information about when medicines should be taken and when their dosage should be changed. Instructions about using a device called a peak flow meter. A peak flow meter measures how well the lungs are working and the severity of your asthma. It helps you monitor your condition. Follow these instructions at home: Take over-the-counter and prescription medicines only as told by your health care provider. Stay up to date on all vaccinations as recommended by your healthcare provider, including vaccines for the flu and pneumonia. Use a peak flow meter and keep track of your peak flow readings. Understand and use your asthma action plan to address any asthma flares. Do not smoke or allow anyone to smoke in your home. Contact a health care provider if: You have wheezing, shortness of breath, or a cough that is not responding to medicines. Your medicines are causing side effects, such as a rash, itching, swelling, or trouble breathing. You need to use a reliever medicine more than 2-3 times a week. Your peak flow reading is still at 50-79% of your personal best after following your action plan for 1 hour. You have a fever and shortness of breath. Get help right away if: You are getting worse and do not respond to treatment during an asthma attack. You are short of breath when at rest or when doing very Strole physical activity. You have difficulty eating, drinking, or talking. You have chest pain or tightness. You develop a fast heartbeat or palpitations. You have a bluish color to your lips or fingernails. You are light-headed or dizzy, or you  faint. Your peak flow reading is less than 50% of your personal best. You feel too tired to breathe normally. These symptoms may be an emergency. Get help right away. Call 911. Do not wait to see if the symptoms will go away. Do not drive yourself to the hospital. Summary Asthma is a long-term (chronic) condition that causes recurrent episodes in which the airways become tight and narrow. Asthma episodes, also called asthma attacks or asthma flares, can cause coughing, wheezing, shortness of breath, and chest pain. Asthma cannot be cured, but medicines and lifestyle changes can help keep it well controlled and prevent asthma flares. Make sure you understand how to avoid triggers and how and when to use your medicines. Asthma attacks can range from minor to life-threatening. Get help right away if you have an asthma attack and do not respond  to treatment with your usual rescue medicines. This information is not intended to replace advice given to you by your health care provider. Make sure you discuss any questions you have with your health care provider. Document Revised: 01/29/2021 Document Reviewed: 01/20/2021 Elsevier Patient Education  2024 [employer]   If you have been instructed to have an in-person evaluation today at a local Urgent Care facility, please use the link below. It will take you to a list of all of our available [facility], including address, phone number and hours of operation. Please do not delay care.  [facility]  If you or a family member do not have a primary care provider, use the link below to schedule a visit and establish care. When you choose a [facility] primary care physician or advanced practice provider, you gain a long-term partner in health. Find a Primary Care Provider  Learn more about [facility]'s in-office and virtual care options:  - Get Care Now

## 2024-01-10 ENCOUNTER — Telehealth: Admitting: Physician Assistant

## 2024-01-10 ENCOUNTER — Other Ambulatory Visit (HOSPITAL_COMMUNITY): Payer: Self-pay

## 2024-01-10 DIAGNOSIS — U071 COVID-19: Secondary | ICD-10-CM

## 2024-01-10 MED ORDER — NIRMATRELVIR/RITONAVIR (PAXLOVID)TABLET
3.0000 | ORAL_TABLET | Freq: Two times a day (BID) | ORAL | 0 refills | Status: AC
  Filled 2024-01-10: qty 30, 5d supply, fill #0

## 2024-01-10 NOTE — Progress Notes (Signed)
 Virtual Visit Consent   Kendra Gonzalez, you are scheduled for a virtual visit with a [facility] provider today. Just as with appointments in the office, your consent must be obtained to participate. Your consent will be active for this visit and any virtual visit you may have with one of our providers in the next 365 days. If you have a MyChart account, a copy of this consent can be sent to you electronically.  As this is a virtual visit, video technology does not allow for your provider to perform a traditional examination. This may limit your provider's ability to fully assess your condition. If your provider identifies any concerns that need to be evaluated in person or the need to arrange testing (such as labs, EKG, etc.), we will make arrangements to do so. Although advances in technology are sophisticated, we cannot ensure that it will always work on either your end or our end. If the connection with a video visit is poor, the visit may have to be switched to a telephone visit. With either a video or telephone visit, we are not always able to ensure that we have a secure connection.  By engaging in this virtual visit, you consent to the provision of healthcare and authorize for your insurance to be billed (if applicable) for the services provided during this visit. Depending on your insurance coverage, you may receive a charge related to this service.  I need to obtain your verbal consent now. Are you willing to proceed with your visit today? Kendra Gonzalez has provided verbal consent on 01/10/2024 for a virtual visit (video or telephone). Kendra [name], PA-C  Date: 01/10/2024 10:46 AM   Virtual Visit via Video Note   I, Kendra Gonzalez, connected with  Kendra Gonzalez  ([id number], 09-Aug-1970) on 01/10/24 at 10:45 AM EDT by a video-enabled telemedicine application and verified that I am speaking with the correct person using two  identifiers.  Location: Patient: Virtual Visit Location Patient: Home Provider: Virtual Visit Location Provider: Home Office   I discussed the limitations of evaluation and management by telemedicine and the availability of in person appointments. The patient expressed understanding and agreed to proceed.    History of Present Illness: Kendra Gonzalez is a 53 y.o. who identifies as a female who was assigned female at birth, and is being seen today for URI symptoms.  HPI: URI  This is a new problem. The current episode started in the past 7 days (Patient was seen on 01/07/24, virtually, and diagnosed with asthma exacerbation. Given Prednisone  40mg  x 5 days and Promethazine  DM). The problem has been unchanged. There has been no fever. Associated symptoms include congestion, coughing, headaches, rhinorrhea, sinus pain, sneezing and a sore throat (mild from drainage). Pertinent negatives include no chest pain, diarrhea, ear pain, nausea, plugged ear sensation, vomiting or wheezing. Associated symptoms comments: Body aches. Treatments tried: Prednisone , Prometazine DM, tylenol . The treatment provided no relief.   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   Unilateral primary osteoarthritis, left knee 07/16/2020   Presence of left artificial hip joint 07/09/2017   Multinodular goiter 04/17/2015  History of stomach ulcers 03/22/2015   Chronic pain of left heel 04/23/2014   Chronic pain syndrome 03/19/2014   Asthma 01/04/2013   BMI 29.0-29.9,adult 10/12/2011   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 (Take nirmatrelvir  150 mg two tablets twice daily for 5 days and ritonavir  100 mg one tablet twice daily for 5 days) Patient GFR is 70, Disp: 30 tablet, Rfl: 0   acetaminophen  (TYLENOL ) 500 MG tablet, Take 1,000 mg by mouth every 8 (eight) hours as needed for moderate pain., Disp: , Rfl:    acetaminophen -codeine  (TYLENOL  #3) 300-30 MG tablet, Take 1 tablet by mouth every 6 (six) hours as needed for moderate pain (pain score 4-6)., Disp: 26 tablet, Rfl: 0   albuterol  (VENTOLIN  HFA) 108 (90 Base) MCG/ACT inhaler, Inhale 1-2 puffs into the lungs every 6 (six) hours as needed., Disp: 6.7 g, Rfl: 0   atorvastatin  (LIPITOR) 40 MG tablet, Take 1 tablet (40 mg total) by mouth daily., Disp: 90 tablet, Rfl: 1   azelastine  (ASTELIN ) 0.1 % nasal spray, Place 1 spray into both nostrils 2 (two) times daily. Use in each nostril as directed, Disp: 30 mL, Rfl: 0   baclofen  (LIORESAL ) 10 MG tablet, Take 1 tablet (10 mg total) by mouth 2 (two) times daily as needed (migraines)., Disp: 90 each, Rfl: 1   cyanocobalamin  (VITAMIN B12) 1000 MCG/ML injection, Inject 1 ml into the muscle once a week for a month. Then injected 1 ml once a month thereafter., Disp: 6 mL, Rfl: 11   eletriptan  (RELPAX ) 40 MG tablet, Take 1 tablet (40 mg total) by mouth as needed for migraine or headache. May repeat in 2 hours if headache persists or recurs., Disp: 10 tablet, Rfl: 5   fluticasone -salmeterol (ADVAIR  HFA) 115-21 MCG/ACT inhaler, Inhale 2 puffs into the lungs 2 (two) times daily., Disp: 1 each, Rfl: 2   fluticasone -salmeterol (ADVAIR ) 100-50 MCG/ACT AEPB, Inhale 1 puff into the lungs 2 (two) times daily., Disp: 60 each, Rfl: 0   gabapentin  (NEURONTIN ) 300  MG capsule, Take 1 capsule (300 mg total) by mouth 3 (three) times daily., Disp: 90 capsule, Rfl: 0   Galcanezumab -gnlm (EMGALITY ) 120 MG/ML SOAJ, Inject 120 mg into the skin every 28 (twenty-eight) days., Disp: 1 mL, Rfl: 11   levothyroxine  (SYNTHROID ) 88 MCG tablet, Take 1 tablet (88 mcg total) by mouth daily., Disp: 90 tablet, Rfl: 3   Magnesium  Gluconate 550 MG TABS, TAKE 1 TABLET BY MOUTH AT BEDTIME., Disp: 90 tablet, Rfl: 0   meloxicam  (MOBIC ) 7.5 MG tablet, Take 1 tablet (7.5 mg total) by mouth daily., Disp: 30 tablet, Rfl: 0   montelukast  (SINGULAIR ) 10 MG tablet, Take 1 tablet (10 mg total) by mouth at bedtime., Disp: 30 tablet, Rfl: 0   ondansetron  (ZOFRAN ) 4 MG tablet, Take 1 tablet (4 mg total) by mouth every 6 (six) hours., Disp: 20 tablet, Rfl: 11   predniSONE  (DELTASONE ) 20 MG tablet, Take 2 tablets (40 mg total) by mouth daily with breakfast., Disp: 14 tablet, Rfl: 0   promethazine -dextromethorphan (PROMETHAZINE -DM) 6.25-15 MG/5ML syrup, Take 5 mLs by mouth 4 (four) times daily as needed., Disp: 118 mL, Rfl: 0  SUMAtriptan  (IMITREX ) 20 MG/ACT nasal spray, Place 1 spray (20 mg total) into the nose as needed for migraine or headache. May repeat in 2 hours if headache persists or recurs.  Maximum 2 sprays in 24 hours., Disp: 6 each, Rfl: 11   SYRINGE-NEEDLE, DISP, 3 ML (BD SAFETYGLIDE SYRINGE/NEEDLE) 25G X 1 3 ML MISC, Use as directed, Disp: 100 each, Rfl: 3   valsartan -hydrochlorothiazide  (DIOVAN -HCT) 160-25 MG tablet, Take 1 tablet by mouth daily. (Patient not taking: Reported on 12/28/2023), Disp: 90 tablet, Rfl: 1  Observations/Objective: Patient is well-developed, well-nourished in no acute distress.  Resting comfortably at home.  Head is normocephalic, atraumatic.  No labored breathing.  Speech is clear and coherent with logical content.  Patient is alert and oriented at baseline.    Assessment and Plan: 1. COVID-19 (Primary) - nirmatrelvir /ritonavir  (PAXLOVID ) 20 x 150 MG  & 10 x 100MG  TABS; Take 3 tablets by mouth 2 (two) times daily for 5 days. (Take nirmatrelvir  150 mg two tablets twice daily for 5 days and ritonavir  100 mg one tablet twice daily for 5 days) Patient GFR is 70  Dispense: 30 tablet; Refill: 0  - Continue OTC symptomatic management of choice - Will send OTC vitamins and supplement information through [facility] - Paxlovid  prescribed - Patient enrolled in MyChart symptom monitoring - Push fluids - Rest as needed - Discussed return precautions and when to seek in-person evaluation, sent via [facility] as well   Follow Up Instructions: I discussed the assessment and treatment plan with the patient. The patient was provided an opportunity to ask questions and all were answered. The patient agreed with the plan and demonstrated an understanding of the instructions.  A copy of instructions were sent to the patient via MyChart unless otherwise noted below.    The patient was advised to call back or seek an in-person evaluation if the symptoms worsen or if the condition fails to improve as anticipated.    Kendra [name], PA-C

## 2024-01-10 NOTE — Patient Instructions (Signed)
 Kendra Gonzalez, thank you for joining Delon [name], PA-C for today's virtual visit.  While this provider is not your primary care provider (PCP), if your PCP is located in our provider database this encounter information will be shared with them immediately following your visit.   A [facility] MyChart account gives you access to today's visit and all your visits, tests, and labs performed at [facility]  click here if you don't have a [facility] MyChart account or go to [email or web address]  Consent: (Patient) Kendra Gonzalez provided verbal consent for this virtual visit at the beginning of the encounter.  Current Medications:  Current Outpatient Medications:    nirmatrelvir /ritonavir  (PAXLOVID ) 20 x 150 MG & 10 x 100MG  TABS, Take 3 tablets by mouth 2 (two) times daily for 5 days. (Take nirmatrelvir  150 mg two tablets twice daily for 5 days and ritonavir  100 mg one tablet twice daily for 5 days) Patient GFR is 70, Disp: 30 tablet, Rfl: 0   acetaminophen  (TYLENOL ) 500 MG tablet, Take 1,000 mg by mouth every 8 (eight) hours as needed for moderate pain., Disp: , Rfl:    acetaminophen -codeine  (TYLENOL  #3) 300-30 MG tablet, Take 1 tablet by mouth every 6 (six) hours as needed for moderate pain (pain score 4-6)., Disp: 26 tablet, Rfl: 0   albuterol  (VENTOLIN  HFA) 108 (90 Base) MCG/ACT inhaler, Inhale 1-2 puffs into the lungs every 6 (six) hours as needed., Disp: 6.7 g, Rfl: 0   atorvastatin  (LIPITOR) 40 MG tablet, Take 1 tablet (40 mg total) by mouth daily., Disp: 90 tablet, Rfl: 1   azelastine  (ASTELIN ) 0.1 % nasal spray, Place 1 spray into both nostrils 2 (two) times daily. Use in each nostril as directed, Disp: 30 mL, Rfl: 0   baclofen  (LIORESAL ) 10 MG tablet, Take 1 tablet (10 mg total) by mouth 2 (two) times daily as needed (migraines)., Disp: 90 each, Rfl: 1   cyanocobalamin  (VITAMIN B12) 1000 MCG/ML injection, Inject 1 ml into the muscle once a week  for a month. Then injected 1 ml once a month thereafter., Disp: 6 mL, Rfl: 11   eletriptan  (RELPAX ) 40 MG tablet, Take 1 tablet (40 mg total) by mouth as needed for migraine or headache. May repeat in 2 hours if headache persists or recurs., Disp: 10 tablet, Rfl: 5   fluticasone -salmeterol (ADVAIR  HFA) 115-21 MCG/ACT inhaler, Inhale 2 puffs into the lungs 2 (two) times daily., Disp: 1 each, Rfl: 2   fluticasone -salmeterol (ADVAIR ) 100-50 MCG/ACT AEPB, Inhale 1 puff into the lungs 2 (two) times daily., Disp: 60 each, Rfl: 0   gabapentin  (NEURONTIN ) 300 MG capsule, Take 1 capsule (300 mg total) by mouth 3 (three) times daily., Disp: 90 capsule, Rfl: 0   Galcanezumab -gnlm (EMGALITY ) 120 MG/ML SOAJ, Inject 120 mg into the skin every 28 (twenty-eight) days., Disp: 1 mL, Rfl: 11   levothyroxine  (SYNTHROID ) 88 MCG tablet, Take 1 tablet (88 mcg total) by mouth daily., Disp: 90 tablet, Rfl: 3   Magnesium  Gluconate 550 MG TABS, TAKE 1 TABLET BY MOUTH AT BEDTIME., Disp: 90 tablet, Rfl: 0   meloxicam  (MOBIC ) 7.5 MG tablet, Take 1 tablet (7.5 mg total) by mouth daily., Disp: 30 tablet, Rfl: 0   montelukast  (SINGULAIR ) 10 MG tablet, Take 1 tablet (10 mg total) by mouth at bedtime., Disp: 30 tablet, Rfl: 0   ondansetron  (ZOFRAN ) 4 MG tablet, Take 1 tablet (4 mg total) by mouth every 6 (six) hours., Disp: 20 tablet, Rfl: 11   predniSONE  (  DELTASONE ) 20 MG tablet, Take 2 tablets (40 mg total) by mouth daily with breakfast., Disp: 14 tablet, Rfl: 0   promethazine -dextromethorphan (PROMETHAZINE -DM) 6.25-15 MG/5ML syrup, Take 5 mLs by mouth 4 (four) times daily as needed., Disp: 118 mL, Rfl: 0   SUMAtriptan  (IMITREX ) 20 MG/ACT nasal spray, Place 1 spray (20 mg total) into the nose as needed for migraine or headache. May repeat in 2 hours if headache persists or recurs.  Maximum 2 sprays in 24 hours., Disp: 6 each, Rfl: 11   SYRINGE-NEEDLE, DISP, 3 ML (BD SAFETYGLIDE SYRINGE/NEEDLE) 25G X 1 3 ML MISC, Use as directed,  Disp: 100 each, Rfl: 3   valsartan -hydrochlorothiazide  (DIOVAN -HCT) 160-25 MG tablet, Take 1 tablet by mouth daily. (Patient not taking: Reported on 12/28/2023), Disp: 90 tablet, Rfl: 1   Medications ordered in this encounter:  Meds ordered this encounter  Medications   nirmatrelvir /ritonavir  (PAXLOVID ) 20 x 150 MG & 10 x 100MG  TABS    Sig: Take 3 tablets by mouth 2 (two) times daily for 5 days. (Take nirmatrelvir  150 mg two tablets twice daily for 5 days and ritonavir  100 mg one tablet twice daily for 5 days) Patient GFR is 70    Dispense:  30 tablet    Refill:  0    Supervising Provider:   BLAISE ALEENE KIDD [phone]     *If you need refills on other medications prior to your next appointment, please contact your pharmacy*  Follow-Up: Call back or seek an in-person evaluation if the symptoms worsen or if the condition fails to improve as anticipated.  [facility] [phone]  Care Instructions: Can take to lessen severity (if able): Vit C 500mg  twice daily Quercertin 250-500mg  twice daily Zinc 75-100mg  daily Melatonin 3-6 mg at bedtime Vit D3 1000-2000 IU daily Aspirin 81 mg daily with food Optional: Famotidine  20mg  daily Also can add tylenol /ibuprofen  as needed for fevers and body aches May add Mucinex  or Mucinex  DM as needed for cough/congestion    Isolation Instructions: You are to isolate at home until you have been fever free for at least 24 hours without a fever-reducing medication, and symptoms have been steadily improving for 24 hours. At that time,  you can end isolation but need to mask for an additional 5 days.   If you must be around other household members who do not have symptoms, you need to make sure that both you and the family members are masking consistently with a high-quality mask.  If you note any worsening of symptoms despite treatment, please seek an in-person evaluation ASAP. If you note any significant shortness of breath or any chest  pain, please seek ER evaluation. Please do not delay care!   COVID-19: What to Do if You Are Sick If you test positive and are an older adult or someone who is at high risk of getting very sick from COVID-19, treatment may be available. Contact a healthcare provider right away after a positive test to determine if you are eligible, even if your symptoms are mild right now. You can also visit a Test to Treat location and, if eligible, receive a prescription from a provider. Don't delay: Treatment must be started within the first few days to be effective. If you have a fever, cough, or other symptoms, you might have COVID-19. Most people have mild illness and are able to recover at home. If you are sick: Keep track of your symptoms. If you have an emergency warning sign (including trouble  breathing), call 911. Steps to help prevent the spread of COVID-19 if you are sick If you are sick with COVID-19 or think you might have COVID-19, follow the steps below to care for yourself and to help protect other people in your home and community. Stay home except to get medical care Stay home. Most people with COVID-19 have mild illness and can recover at home without medical care. Do not leave your home, except to get medical care. Do not visit public areas and do not go to places where you are unable to wear a mask. Take care of yourself. Get rest and stay hydrated. Take over-the-counter medicines, such as acetaminophen , to help you feel better. Stay in touch with your doctor. Call before you get medical care. Be sure to get care if you have trouble breathing, or have any other emergency warning signs, or if you think it is an emergency. Avoid public transportation, ride-sharing, or taxis if possible. Get tested If you have symptoms of COVID-19, get tested. While waiting for test results, stay away from others, including staying apart from those living in your household. Get tested as soon as possible after  your symptoms start. Treatments may be available for people with COVID-19 who are at risk for becoming very sick. Don't delay: Treatment must be started early to be effective--some treatments must begin within 5 days of your first symptoms. Contact your healthcare provider right away if your test result is positive to determine if you are eligible. Self-tests are one of several options for testing for the virus that causes COVID-19 and may be more convenient than laboratory-based tests and point-of-care tests. Ask your healthcare provider or your local health department if you need help interpreting your test results. You can visit your state, tribal, local, and territorial health department's website to look for the latest local information on testing sites. Separate yourself from other people As much as possible, stay in a specific room and away from other people and pets in your home. If possible, you should use a separate bathroom. If you need to be around other people or animals in or outside of the home, wear a well-fitting mask. Tell your close contacts that they may have been exposed to COVID-19. An infected person can spread COVID-19 starting 48 hours (or 2 days) before the person has any symptoms or tests positive. By letting your close contacts know they may have been exposed to COVID-19, you are helping to protect everyone. See COVID-19 and Animals if you have questions about pets. If you are diagnosed with COVID-19, someone from the health department may call you. Answer the call to slow the spread. Monitor your symptoms Symptoms of COVID-19 include fever, cough, or other symptoms. Follow care instructions from your healthcare provider and local health department. Your local health authorities may give instructions on checking your symptoms and reporting information. When to seek emergency medical attention Look for emergency warning signs* for COVID-19. If someone is showing any of these  signs, seek emergency medical care immediately: Trouble breathing Persistent pain or pressure in the chest New confusion Inability to wake or stay awake Pale, gray, or blue-colored skin, lips, or nail beds, depending on skin tone *This list is not all possible symptoms. Please call your medical provider for any other symptoms that are severe or concerning to you. Call 911 or call ahead to your local emergency facility: Notify the operator that you are seeking care for someone who has or may have COVID-19. Call  ahead before visiting your doctor Call ahead. Many medical visits for routine care are being postponed or done by phone or telemedicine. If you have a medical appointment that cannot be postponed, call your doctor's office, and tell them you have or may have COVID-19. This will help the office protect themselves and other patients. If you are sick, wear a well-fitting mask You should wear a mask if you must be around other people or animals, including pets (even at home). Wear a mask with the best fit, protection, and comfort for you. You don't need to wear the mask if you are alone. If you can't put on a mask (because of trouble breathing, for example), cover your coughs and sneezes in some other way. Try to stay at least 6 feet away from other people. This will help protect the people around you. Masks should not be placed on young children under age 59 years, anyone who has trouble breathing, or anyone who is not able to remove the mask without help. Cover your coughs and sneezes Cover your mouth and nose with a tissue when you cough or sneeze. Throw away used tissues in a lined trash can. Immediately wash your hands with soap and water for at least 20 seconds. If soap and water are not available, clean your hands with an alcohol-based hand sanitizer that contains at least 60% alcohol. Clean your hands often Wash your hands often with soap and water for at least 20 seconds. This is  especially important after blowing your nose, coughing, or sneezing; going to the bathroom; and before eating or preparing food. Use hand sanitizer if soap and water are not available. Use an alcohol-based hand sanitizer with at least 60% alcohol, covering all surfaces of your hands and rubbing them together until they feel dry. Soap and water are the best option, especially if hands are visibly dirty. Avoid touching your eyes, nose, and mouth with unwashed hands. Handwashing Tips Avoid sharing personal household items Do not share dishes, drinking glasses, cups, eating utensils, towels, or bedding with other people in your home. Wash these items thoroughly after using them with soap and water or put in the dishwasher. Clean surfaces in your home regularly Clean and disinfect high-touch surfaces (for example, doorknobs, tables, handles, light switches, and countertops) in your sick room and bathroom. In shared spaces, you should clean and disinfect surfaces and items after each use by the person who is ill. If you are sick and cannot clean, a caregiver or other person should only clean and disinfect the area around you (such as your bedroom and bathroom) on an as needed basis. Your caregiver/other person should wait as long as possible (at least several hours) and wear a mask before entering, cleaning, and disinfecting shared spaces that you use. Clean and disinfect areas that may have blood, stool, or body fluids on them. Use household cleaners and disinfectants. Clean visible dirty surfaces with household cleaners containing soap or detergent. Then, use a household disinfectant. Use a product from [employer] List N: Disinfectants for Coronavirus (COVID-19). Be sure to follow the instructions on the label to ensure safe and effective use of the product. Many products recommend keeping the surface wet with a disinfectant for a certain period of time (look at contact time on the product label). You may  also need to wear personal protective equipment, such as gloves, depending on the directions on the product label. Immediately after disinfecting, wash your hands with soap and water for 20 seconds.  For completed guidance on cleaning and disinfecting your home, visit Complete Disinfection Guidance. Take steps to improve ventilation at home Improve ventilation (air flow) at home to help prevent from spreading COVID-19 to other people in your household. Clear out COVID-19 virus particles in the air by opening windows, using air filters, and turning on fans in your home. Use this interactive tool to learn how to improve air flow in your home. When you can be around others after being sick with COVID-19 Deciding when you can be around others is different for different situations. Find out when you can safely end home isolation. For any additional questions about your care, contact your healthcare provider or state or local health department. 07/16/2020 Content source: [facility] ([facility]), Division of Viral Diseases This information is not intended to replace advice given to you by your health care provider. Make sure you discuss any questions you have with your health care provider. Document Revised: 08/29/2020 Document Reviewed: 08/29/2020 Elsevier Patient Education  2022 [employer].     If you have been instructed to have an in-person evaluation today at a local Urgent Care facility, please use the link below. It will take you to a list of all of our available [facility], including address, phone number and hours of operation. Please do not delay care.  [facility]  If you or a family member do not have a primary care provider, use the link below to schedule a visit and establish care. When you choose a [facility] primary care physician or advanced practice provider, you gain a long-term partner in health. Find a  Primary Care Provider  Learn more about [facility] in-office and virtual care options: [facility] - Get Care Now

## 2024-01-13 NOTE — Progress Notes (Signed)
 NEUROLOGY FOLLOW UP OFFICE NOTE  Kendra Gonzalez [id number]  Assessment/Plan:   Migraine without aura, without status migrainosus, intractable     Migraine prevention:  Plan to start Qulipta  60mg  daily Migraine rescue:  Will have her try samples of Nurtec.   Lifestyle modification: Limit use of pain relievers to no more than 9 days out of the month to prevent risk of rebound or medication-overuse headache. Diet modification/hydration/caffeine  cessation Routine exercise Sleep hygiene Consider vitamins/supplements:  magnesium  citrate 400mg  daily, riboflavin 400mg  daily, CoQ10 100mg  three times daily Keep headache diary Follow up 6 months      Subjective:  Kendra Gonzalez is a 53 year old female with hypothyroidism, HTN, asthma, cataracts and history of kidney stones who follows up for migraines.   UPDATE: With migraine today.   Stopped Emgality  at the beginning of the year due to bruising.   Intensity:  moderate Duration:  naps for an hour and resolved with sumatriptan  NS Frequency:  5 to 6 a month.     Frequency of abortive medication: 2 a month Current NSAIDS/analgesics:  none Current triptans: sumatriptan  20mg  NS Current ergotamine:  none Current anti-emetic:  Zofran  4mg  Current muscle relaxants:   Current Antihypertensive medications:  valsartan -HCTZ Current Antidepressant medications:  none Current Anticonvulsant medications:  none Current anti-CGRP: none Current Vitamins/Herbal/Supplements:  magnesium  gluconate Current Antihistamines/Decongestants:  none Other therapy: physical therapy for neck pain Hormone/birth control:  none   Caffeine :  No coffee.  May drink tea 2 days a week Alcohol:  No Smoker:  No Diet:  four 16 oz water bottles daily.  No soda.  Does not skip meals Exercise:  walks or runs 30 minutes Depression:  no; Anxiety:  no Other pain:  left hip pain Sleep hygiene:  10 PM to 6 AM.  Toss and turns due to hip pain   HISTORY:   Onset:  53 years old Location:  starts in back of neck and radiates up to behind either eye (usually left side) Quality:  pressure Intensity:  10/10 Aura:  absent Prodrome:  absent Associated symptoms:  Nausea, photophobia, phonophobia, osmophobia, blurred vision.  She denies associated vomiting, unilateral numbness or weakness. Duration:  2-3 days Frequency:  1 to 2 times a week Frequency of abortive medication: Excedrin 3 days a week Triggers:  scents (perfumes), seasonal allergies Relieving factors:  resting in dark quiet room Activity:  aggravates   MRI of brain with and without contrast on 07/18/2019 showed minimal nonspecific T2 signal changes in the cerebral white matter and partially empty sella.  MRI of c-spine on 08/19/2019 revealed mild multilevel cervical spondylosis with mild neural foraminal narrowing but without significant spinal canal stenosis or cord impingement    Past NSAIDS/analgesics:  Fioricet, Excedrin, ibuprofen , indomethacin , meloxicam , diclofenac  75mg , naproxen , tramadol , acetaminophen  Past abortive triptans:  sumatriptan  50mg , rizatriptan  (drowsiness) Past abortive ergotamine:  none Past muscle relaxants:  baclofen , cyclobenzaprine  5mg  QHS (muscle spasms), tizanidine  2-4mg  PRN (muscle spasms) Past anti-emetic:  Zofran , promethazine  Past antihypertensive medications:  propranolol  Past antidepressant medications:  venlafaxine  Past anticonvulsant medications:  zonisamide , gabapentin , pregablin Past anti-CGRP:  Ubrelvy  100mg , Emgality  Past vitamins/Herbal/Supplements:  none Past antihistamines/decongestants:  Xyzal  Other past therapies:  none     Family history of headache:  none    PAST MEDICAL HISTORY: Past Medical History:  Diagnosis Date   Allergy    Arthritis    Asthma    Cataract    bilateral eyes   Endometriosis    GERD (gastroesophageal reflux disease)  Headache    migraines   History of tetanus, diphtheria, and acellular pertussis  booster vaccination (Tdap)    07/30/2010   Hyperlipidemia    Kidney stones    Pancreatitis     MEDICATIONS: Current Outpatient Medications on File Prior to Visit  Medication Sig Dispense Refill   acetaminophen  (TYLENOL ) 500 MG tablet Take 1,000 mg by mouth every 8 (eight) hours as needed for moderate pain.     acetaminophen -codeine  (TYLENOL  #3) 300-30 MG tablet Take 1 tablet by mouth every 6 (six) hours as needed for moderate pain (pain score 4-6). 26 tablet 0   albuterol  (VENTOLIN  HFA) 108 (90 Base) MCG/ACT inhaler Inhale 1-2 puffs into the lungs every 6 (six) hours as needed. 6.7 g 0   atorvastatin  (LIPITOR) 40 MG tablet Take 1 tablet (40 mg total) by mouth daily. 90 tablet 1   azelastine  (ASTELIN ) 0.1 % nasal spray Place 1 spray into both nostrils 2 (two) times daily. Use in each nostril as directed 30 mL 0   baclofen  (LIORESAL ) 10 MG tablet Take 1 tablet (10 mg total) by mouth 2 (two) times daily as needed (migraines). 90 each 1   cyanocobalamin  (VITAMIN B12) 1000 MCG/ML injection Inject 1 ml into the muscle once a week for a month. Then injected 1 ml once a month thereafter. 6 mL 11   eletriptan  (RELPAX ) 40 MG tablet Take 1 tablet (40 mg total) by mouth as needed for migraine or headache. May repeat in 2 hours if headache persists or recurs. 10 tablet 5   fluticasone -salmeterol (ADVAIR  HFA) 115-21 MCG/ACT inhaler Inhale 2 puffs into the lungs 2 (two) times daily. 1 each 2   fluticasone -salmeterol (ADVAIR ) 100-50 MCG/ACT AEPB Inhale 1 puff into the lungs 2 (two) times daily. 60 each 0   gabapentin  (NEURONTIN ) 300 MG capsule Take 1 capsule (300 mg total) by mouth 3 (three) times daily. 90 capsule 0   Galcanezumab -gnlm (EMGALITY ) 120 MG/ML SOAJ Inject 120 mg into the skin every 28 (twenty-eight) days. 1 mL 11   levothyroxine  (SYNTHROID ) 88 MCG tablet Take 1 tablet (88 mcg total) by mouth daily. 90 tablet 3   Magnesium  Gluconate 550 MG TABS TAKE 1 TABLET BY MOUTH AT BEDTIME. 90 tablet 0    meloxicam  (MOBIC ) 7.5 MG tablet Take 1 tablet (7.5 mg total) by mouth daily. 30 tablet 0   montelukast  (SINGULAIR ) 10 MG tablet Take 1 tablet (10 mg total) by mouth at bedtime. 30 tablet 0   nirmatrelvir /ritonavir  (PAXLOVID ) 20 x 150 MG & 10 x 100MG  TABS Take 3 tablets by mouth 2 (two) times daily for 5 days. (Take nirmatrelvir  150 mg two tablets twice daily for 5 days and ritonavir  100 mg one tablet twice daily for 5 days) 30 tablet 0   ondansetron  (ZOFRAN ) 4 MG tablet Take 1 tablet (4 mg total) by mouth every 6 (six) hours. 20 tablet 11   predniSONE  (DELTASONE ) 20 MG tablet Take 2 tablets (40 mg total) by mouth daily with breakfast. 14 tablet 0   promethazine -dextromethorphan (PROMETHAZINE -DM) 6.25-15 MG/5ML syrup Take 5 mLs by mouth 4 (four) times daily as needed. 118 mL 0   SUMAtriptan  (IMITREX ) 20 MG/ACT nasal spray Place 1 spray (20 mg total) into the nose as needed for migraine or headache. May repeat in 2 hours if headache persists or recurs.  Maximum 2 sprays in 24 hours. 6 each 11   SYRINGE-NEEDLE, DISP, 3 ML (BD SAFETYGLIDE SYRINGE/NEEDLE) 25G X 1 3 ML MISC Use as directed 100 each  3   valsartan -hydrochlorothiazide  (DIOVAN -HCT) 160-25 MG tablet Take 1 tablet by mouth daily. (Patient not taking: Reported on 12/28/2023) 90 tablet 1   [DISCONTINUED] methimazole  (TAPAZOLE ) 10 MG tablet Take 1 tablet (10 mg total) by mouth 2 (two) times daily. (Patient not taking: Reported on 10/29/2020) 180 tablet 3   No current facility-administered medications on file prior to visit.    ALLERGIES: Allergies  Allergen Reactions   Hydromorphone  Itching    FAMILY HISTORY: Family History  Problem Relation Age of Onset   Hypertension Father    Hypertension Sister    Hypertension Sister    Heart disease Daughter    Hypertension Maternal Grandfather    Heart disease Maternal Grandfather    Hypertension Paternal Grandfather    Hypertension Brother    Hypertension Brother    Thyroid  disease Neg Hx     Colon polyps Neg Hx    Colon cancer Neg Hx    Esophageal cancer Neg Hx    Rectal cancer Neg Hx    Stomach cancer Neg Hx    Breast cancer Neg Hx       Objective:  Blood pressure 135/88, pulse 79, height 5' 4 (1.626 m), weight 190 lb 6.4 oz (86.4 kg), last menstrual period 06/06/1996, SpO2 100%. General: No acute distress.  Patient appears well-groomed.   Head:  Normocephalic/atraumatic Eyes:  Fundi examined but not visualized Neck: supple, no paraspinal tenderness, full range of motion Heart:  Regular rate and rhythm Neurological Exam: alert and oriented.  Speech fluent and not dysarthric, language intact.  CN II-XII intact. Bulk and tone normal, muscle strength 5/5 throughout.  Sensation to light touch intact.  Deep tendon reflexes 2+ throughout.  Finger to nose testing intact.  Gait normal, Romberg negative.   Juliene Dunnings, DO  CC: Tully Theophilus Andrews, MD

## 2024-01-17 ENCOUNTER — Other Ambulatory Visit: Payer: Self-pay | Admitting: Neurology

## 2024-01-17 ENCOUNTER — Encounter: Payer: Self-pay | Admitting: Neurology

## 2024-01-17 ENCOUNTER — Ambulatory Visit: Payer: 59 | Admitting: Neurology

## 2024-01-17 VITALS — BP 138/83 | HR 66 | Ht 64.0 in | Wt 206.0 lb

## 2024-01-17 DIAGNOSIS — G43009 Migraine without aura, not intractable, without status migrainosus: Secondary | ICD-10-CM

## 2024-01-17 MED ORDER — ONDANSETRON HCL 4 MG PO TABS: 4.0000 mg | ORAL_TABLET | Freq: Four times a day (QID) | ORAL | 11 refills | Status: DC

## 2024-01-17 MED ORDER — ONDANSETRON HCL 4 MG PO TABS: 4.0000 mg | ORAL_TABLET | Freq: Four times a day (QID) | ORAL | 11 refills | Status: AC

## 2024-01-17 MED ORDER — QULIPTA 60 MG PO TABS: 60.0000 mg | ORAL_TABLET | Freq: Every day | ORAL | 11 refills | Status: AC

## 2024-01-17 MED ORDER — SUMATRIPTAN 20 MG/ACT NA SOLN: 20.0000 mg | NASAL | 11 refills | Status: DC | PRN

## 2024-01-17 NOTE — Patient Instructions (Signed)
 Plan to start QULIPTA  60MG  DAILY. If no improvement in 3 months, contact me Take Nurtec once daily as needed.  Sumatriptan  nasal spray as directed.  Limit use of pain relievers to no more than 9 days out of the month to prevent risk of rebound or medication-overuse headache. Keep headache diary Follow up 6 months.

## 2024-01-19 ENCOUNTER — Telehealth: Payer: Self-pay | Admitting: Pharmacy Technician

## 2024-01-19 NOTE — Telephone Encounter (Signed)
 Pharmacy Patient Advocate Encounter   Received notification from Fax that prior authorization for QULIPTA  60MG  is required/requested.   Insurance verification completed.   The patient is insured through [facility] .   Per test claim: PA required; PA submitted to above mentioned insurance via Latent Key/confirmation #/EOC [id number] Status is pending

## 2024-01-20 ENCOUNTER — Encounter: Payer: Self-pay | Admitting: Neurology

## 2024-01-21 NOTE — Telephone Encounter (Signed)
 Pharmacy Patient Advocate Encounter  Received notification from [employer] that Prior Authorization for Qulipta  60MG  tablets has been APPROVED from 01-19-2024 to 01-18-2025   PA #/Case ID/Reference #: [id number]

## 2024-01-31 ENCOUNTER — Other Ambulatory Visit: Payer: Self-pay | Admitting: Internal Medicine

## 2024-01-31 DIAGNOSIS — Z1231 Encounter for screening mammogram for malignant neoplasm of breast: Secondary | ICD-10-CM

## 2024-02-03 ENCOUNTER — Encounter: Payer: Self-pay | Admitting: Internal Medicine

## 2024-02-03 ENCOUNTER — Ambulatory Visit: Admitting: Internal Medicine

## 2024-02-03 VITALS — BP 110/80 | HR 66 | Temp 97.8°F | Wt 204.1 lb

## 2024-02-03 DIAGNOSIS — L923 Foreign body granuloma of the skin and subcutaneous tissue: Secondary | ICD-10-CM

## 2024-02-03 MED ORDER — TRIAMCINOLONE ACETONIDE 0.1 % EX CREA: 1.0000 | TOPICAL_CREAM | Freq: Two times a day (BID) | CUTANEOUS | 0 refills | Status: AC

## 2024-02-03 NOTE — Progress Notes (Signed)
 Established Patient Office Visit     CC/Reason for Visit: Tattoo reaction  HPI: Kendra Gonzalez is a 53 y.o. female who is coming in today for the above mentioned reasons.  She had a tattoo on the inside of her right forearm earlier this year.  Over the last few weeks she has noticed some itching and redness surrounding the area.  The areas that are inked are [place] raised.   Past Medical/Surgical History: Past Medical History:  Diagnosis Date   Allergy    Arthritis    Asthma    Cataract    bilateral eyes   Endometriosis    GERD (gastroesophageal reflux disease)    Headache    migraines   History of tetanus, diphtheria, and acellular pertussis booster vaccination (Tdap)    07/30/2010   Hyperlipidemia    Kidney stones    Pancreatitis     Past Surgical History:  Procedure Laterality Date   ABDOMINAL HYSTERECTOMY     arthroscopic knee surgery      carpel tunnel     CHOLECYSTECTOMY     ERCP  01/08/2012   Procedure: ENDOSCOPIC RETROGRADE CHOLANGIOPANCREATOGRAPHY (ERCP);  Surgeon: Belvie JONETTA Just, MD;  Location: [facility] ENDOSCOPY;  Service: Endoscopy;  Laterality: N/A;   HIP ARTHROPLASTY     left   HIP SURGERY     oophrectomy  2011   secondary to cysts   THYROIDECTOMY N/A 11/21/2020   Procedure: TOTAL THYROIDECTOMY;  Surgeon: Eletha Boas, MD;  Location: [facility] ORS;  Service: General;  Laterality: N/A;   TUBAL LIGATION     UPPER GASTROINTESTINAL ENDOSCOPY  2016    Social History:  reports that she has never smoked. She has never used smokeless tobacco. She reports that she does not drink alcohol and does not use drugs.  Allergies: Allergies  Allergen Reactions   Hydromorphone  Itching    Family History:  Family History  Problem Relation Age of Onset   Hypertension Father    Hypertension Sister    Hypertension Sister    Heart disease Daughter    Hypertension Maternal Grandfather    Heart disease Maternal Grandfather    Hypertension Paternal Grandfather     Hypertension Brother    Hypertension Brother    Thyroid  disease Neg Hx    Colon polyps Neg Hx    Colon cancer Neg Hx    Esophageal cancer Neg Hx    Rectal cancer Neg Hx    Stomach cancer Neg Hx    Breast cancer Neg Hx      Current Outpatient Medications:    acetaminophen  (TYLENOL ) 500 MG tablet, Take 1,000 mg by mouth every 8 (eight) hours as needed for moderate pain., Disp: , Rfl:    acetaminophen -codeine  (TYLENOL  #3) 300-30 MG tablet, Take 1 tablet by mouth every 6 (six) hours as needed for moderate pain (pain score 4-6)., Disp: 26 tablet, Rfl: 0   albuterol  (VENTOLIN  HFA) 108 (90 Base) MCG/ACT inhaler, Inhale 1-2 puffs into the lungs every 6 (six) hours as needed., Disp: 6.7 g, Rfl: 0   Atogepant  (QULIPTA ) 60 MG TABS, Take 1 tablet (60 mg total) by mouth daily., Disp: 30 tablet, Rfl: 11   atorvastatin  (LIPITOR) 40 MG tablet, Take 1 tablet (40 mg total) by mouth daily., Disp: 90 tablet, Rfl: 1   azelastine  (ASTELIN ) 0.1 % nasal spray, Place 1 spray into both nostrils 2 (two) times daily. Use in each nostril as directed, Disp: 30 mL, Rfl: 0   baclofen  (LIORESAL )  10 MG tablet, Take 1 tablet (10 mg total) by mouth 2 (two) times daily as needed (migraines)., Disp: 90 each, Rfl: 1   cyanocobalamin  (VITAMIN B12) 1000 MCG/ML injection, Inject 1 ml into the muscle once a week for a month. Then injected 1 ml once a month thereafter., Disp: 6 mL, Rfl: 11   fluticasone -salmeterol (ADVAIR  HFA) 115-21 MCG/ACT inhaler, Inhale 2 puffs into the lungs 2 (two) times daily., Disp: 1 each, Rfl: 2   fluticasone -salmeterol (ADVAIR ) 100-50 MCG/ACT AEPB, Inhale 1 puff into the lungs 2 (two) times daily., Disp: 60 each, Rfl: 0   levothyroxine  (SYNTHROID ) 88 MCG tablet, Take 1 tablet (88 mcg total) by mouth daily., Disp: 90 tablet, Rfl: 3   Magnesium  Gluconate 550 MG TABS, TAKE 1 TABLET BY MOUTH AT BEDTIME., Disp: 90 tablet, Rfl: 0   montelukast  (SINGULAIR ) 10 MG tablet, Take 1 tablet (10 mg total) by mouth at  bedtime., Disp: 30 tablet, Rfl: 0   ondansetron  (ZOFRAN ) 4 MG tablet, Take 1 tablet (4 mg total) by mouth every 6 (six) hours., Disp: 20 tablet, Rfl: 11   predniSONE  (DELTASONE ) 20 MG tablet, Take 2 tablets (40 mg total) by mouth daily with breakfast., Disp: 14 tablet, Rfl: 0   promethazine -dextromethorphan (PROMETHAZINE -DM) 6.25-15 MG/5ML syrup, Take 5 mLs by mouth 4 (four) times daily as needed., Disp: 118 mL, Rfl: 0   SUMAtriptan  (IMITREX ) 20 MG/ACT nasal spray, Place 1 spray (20 mg total) into the nose as needed for migraine or headache. May repeat in 2 hours if headache persists or recurs.  Maximum 2 sprays in 24 hours., Disp: 6 each, Rfl: 11   SYRINGE-NEEDLE, DISP, 3 ML (BD SAFETYGLIDE SYRINGE/NEEDLE) 25G X 1 3 ML MISC, Use as directed, Disp: 100 each, Rfl: 3   triamcinolone  cream (KENALOG ) 0.1 %, Apply 1 Application topically 2 (two) times daily., Disp: 30 g, Rfl: 0  Review of Systems:  Negative unless indicated in HPI.   Physical Exam: Vitals:   02/03/24 0805  BP: 110/80  Pulse: 66  Temp: 97.8 F (36.6 C)  TempSrc: Oral  SpO2: 99%  Weight: 204 lb 1.6 oz (92.6 kg)    Body mass index is 35.03 kg/m.    Impression and Plan:  Tattoo reaction -     Triamcinolone  Acetonide; Apply 1 Application topically 2 (two) times daily.  Dispense: 30 g; Refill: 0   - Probably local skin reaction.  Will try triamcinolone  cream.  Time spent:21 minutes reviewing chart, interviewing and examining patient and formulating plan of care.     Tully Theophilus Andrews, MD [facility]

## 2024-02-14 ENCOUNTER — Ambulatory Visit

## 2024-02-15 ENCOUNTER — Inpatient Hospital Stay: Admission: RE | Admit: 2024-02-15 | Discharge: 2024-02-15 | Attending: Internal Medicine | Admitting: Internal Medicine

## 2024-02-15 ENCOUNTER — Encounter: Payer: Self-pay | Admitting: Internal Medicine

## 2024-02-15 ENCOUNTER — Ambulatory Visit (INDEPENDENT_AMBULATORY_CARE_PROVIDER_SITE_OTHER): Admitting: Internal Medicine

## 2024-02-15 VITALS — BP 130/88 | HR 94 | Temp 98.3°F | Wt 199.1 lb

## 2024-02-15 DIAGNOSIS — J069 Acute upper respiratory infection, unspecified: Secondary | ICD-10-CM | POA: Diagnosis not present

## 2024-02-15 DIAGNOSIS — R059 Cough, unspecified: Secondary | ICD-10-CM | POA: Diagnosis not present

## 2024-02-15 DIAGNOSIS — Z1231 Encounter for screening mammogram for malignant neoplasm of breast: Secondary | ICD-10-CM

## 2024-02-15 LAB — POCT INFLUENZA A/B
Influenza A, POC: NEGATIVE
Influenza B, POC: NEGATIVE

## 2024-02-15 LAB — POC COVID19 BINAXNOW: SARS Coronavirus 2 Ag: NEGATIVE

## 2024-02-15 NOTE — Progress Notes (Signed)
 Established Patient Office Visit     CC/Reason for Visit: URI symptoms  HPI: Kendra Gonzalez is a 53 y.o. female who is coming in today for the above mentioned reasons.  3 days ago started experiencing postnasal drip, sore throat, body aches and sneezing.  No fever.  No sick contacts or recent travel.   Past Medical/Surgical History: Past Medical History:  Diagnosis Date   Allergy    Arthritis    Asthma    Cataract    bilateral eyes   Endometriosis    GERD (gastroesophageal reflux disease)    Headache    migraines   History of tetanus, diphtheria, and acellular pertussis booster vaccination (Tdap)    07/30/2010   Hyperlipidemia    Kidney stones    Pancreatitis     Past Surgical History:  Procedure Laterality Date   ABDOMINAL HYSTERECTOMY     arthroscopic knee surgery      carpel tunnel     CHOLECYSTECTOMY     ERCP  01/08/2012   Procedure: ENDOSCOPIC RETROGRADE CHOLANGIOPANCREATOGRAPHY (ERCP);  Surgeon: Belvie JONETTA Just, MD;  Location: [facility] ENDOSCOPY;  Service: Endoscopy;  Laterality: N/A;   HIP ARTHROPLASTY     left   HIP SURGERY     oophrectomy  2011   secondary to cysts   THYROIDECTOMY N/A 11/21/2020   Procedure: TOTAL THYROIDECTOMY;  Surgeon: Eletha Boas, MD;  Location: [facility] ORS;  Service: General;  Laterality: N/A;   TUBAL LIGATION     UPPER GASTROINTESTINAL ENDOSCOPY  2016    Social History:  reports that she has never smoked. She has never used smokeless tobacco. She reports that she does not drink alcohol and does not use drugs.  Allergies: Allergies  Allergen Reactions   Hydromorphone  Itching    Family History:  Family History  Problem Relation Age of Onset   Hypertension Father    Hypertension Sister    Hypertension Sister    Heart disease Daughter    Hypertension Maternal Grandfather    Heart disease Maternal Grandfather    Hypertension Paternal Grandfather    Hypertension Brother    Hypertension Brother    Thyroid  disease Neg Hx     Colon polyps Neg Hx    Colon cancer Neg Hx    Esophageal cancer Neg Hx    Rectal cancer Neg Hx    Stomach cancer Neg Hx    Breast cancer Neg Hx      Current Outpatient Medications:    acetaminophen  (TYLENOL ) 500 MG tablet, Take 1,000 mg by mouth every 8 (eight) hours as needed for moderate pain., Disp: , Rfl:    acetaminophen -codeine  (TYLENOL  #3) 300-30 MG tablet, Take 1 tablet by mouth every 6 (six) hours as needed for moderate pain (pain score 4-6)., Disp: 26 tablet, Rfl: 0   albuterol  (VENTOLIN  HFA) 108 (90 Base) MCG/ACT inhaler, Inhale 1-2 puffs into the lungs every 6 (six) hours as needed., Disp: 6.7 g, Rfl: 0   Atogepant  (QULIPTA ) 60 MG TABS, Take 1 tablet (60 mg total) by mouth daily., Disp: 30 tablet, Rfl: 11   atorvastatin  (LIPITOR) 40 MG tablet, Take 1 tablet (40 mg total) by mouth daily., Disp: 90 tablet, Rfl: 1   azelastine  (ASTELIN ) 0.1 % nasal spray, Place 1 spray into both nostrils 2 (two) times daily. Use in each nostril as directed, Disp: 30 mL, Rfl: 0   baclofen  (LIORESAL ) 10 MG tablet, Take 1 tablet (10 mg total) by mouth 2 (two) times daily as  needed (migraines)., Disp: 90 each, Rfl: 1   cyanocobalamin  (VITAMIN B12) 1000 MCG/ML injection, Inject 1 ml into the muscle once a week for a month. Then injected 1 ml once a month thereafter., Disp: 6 mL, Rfl: 11   fluticasone -salmeterol (ADVAIR  HFA) 115-21 MCG/ACT inhaler, Inhale 2 puffs into the lungs 2 (two) times daily., Disp: 1 each, Rfl: 2   fluticasone -salmeterol (ADVAIR ) 100-50 MCG/ACT AEPB, Inhale 1 puff into the lungs 2 (two) times daily., Disp: 60 each, Rfl: 0   levothyroxine  (SYNTHROID ) 88 MCG tablet, Take 1 tablet (88 mcg total) by mouth daily., Disp: 90 tablet, Rfl: 3   Magnesium  Gluconate 550 MG TABS, TAKE 1 TABLET BY MOUTH AT BEDTIME., Disp: 90 tablet, Rfl: 0   montelukast  (SINGULAIR ) 10 MG tablet, Take 1 tablet (10 mg total) by mouth at bedtime., Disp: 30 tablet, Rfl: 0   ondansetron  (ZOFRAN ) 4 MG tablet, Take 1  tablet (4 mg total) by mouth every 6 (six) hours., Disp: 20 tablet, Rfl: 11   predniSONE  (DELTASONE ) 20 MG tablet, Take 2 tablets (40 mg total) by mouth daily with breakfast., Disp: 14 tablet, Rfl: 0   promethazine -dextromethorphan (PROMETHAZINE -DM) 6.25-15 MG/5ML syrup, Take 5 mLs by mouth 4 (four) times daily as needed., Disp: 118 mL, Rfl: 0   SUMAtriptan  (IMITREX ) 20 MG/ACT nasal spray, Place 1 spray (20 mg total) into the nose as needed for migraine or headache. May repeat in 2 hours if headache persists or recurs.  Maximum 2 sprays in 24 hours., Disp: 6 each, Rfl: 11   SYRINGE-NEEDLE, DISP, 3 ML (BD SAFETYGLIDE SYRINGE/NEEDLE) 25G X 1 3 ML MISC, Use as directed, Disp: 100 each, Rfl: 3   triamcinolone  cream (KENALOG ) 0.1 %, Apply 1 Application topically 2 (two) times daily., Disp: 30 g, Rfl: 0  Review of Systems:  Negative unless indicated in HPI.   Physical Exam: Vitals:   02/15/24 0937  BP: 130/88  Pulse: 94  Temp: 98.3 F (36.8 C)  TempSrc: Oral  SpO2: 99%  Weight: 199 lb 1.6 oz (90.3 kg)    Body mass index is 34.18 kg/m.   Physical Exam Vitals reviewed.  Constitutional:      Appearance: Normal appearance.  HENT:     Right Ear: Tympanic membrane, ear canal and external ear normal.     Left Ear: Tympanic membrane, ear canal and external ear normal.     Mouth/Throat:     Mouth: Mucous membranes are moist.     Pharynx: Posterior oropharyngeal erythema present.  Eyes:     Conjunctiva/sclera: Conjunctivae normal.  Cardiovascular:     Rate and Rhythm: Normal rate and regular rhythm.  Pulmonary:     Effort: Pulmonary effort is normal.     Breath sounds: Normal breath sounds.  Neurological:     Mental Status: She is alert.      Impression and Plan:  Cough, unspecified type -     POC COVID-19 BinaxNow -     POCT Influenza A/B  Viral URI   - In office flu and COVID test are negative. - Given exam findings, PNA, pharyngitis, ear infection are not likely, hence  abx have not been prescribed. -Have advised rest, fluids, OTC antihistamines, cough suppressants and mucinex . -RTC if no improvement in 10-14 days.   Time spent:23 minutes reviewing chart, interviewing and examining patient and formulating plan of care.     Tully Theophilus Andrews, MD [facility]

## 2024-02-16 ENCOUNTER — Encounter: Payer: Self-pay | Admitting: Emergency Medicine

## 2024-02-16 ENCOUNTER — Ambulatory Visit
Admission: EM | Admit: 2024-02-16 | Discharge: 2024-02-16 | Disposition: A | Discharge status: Home / Self Care | Attending: Family Medicine | Admitting: Family Medicine

## 2024-02-16 ENCOUNTER — Other Ambulatory Visit (HOSPITAL_COMMUNITY): Payer: Self-pay

## 2024-02-16 DIAGNOSIS — R10A2 Flank pain, left side: Secondary | ICD-10-CM

## 2024-02-16 DIAGNOSIS — J4521 Mild intermittent asthma with (acute) exacerbation: Secondary | ICD-10-CM | POA: Diagnosis not present

## 2024-02-16 DIAGNOSIS — H6691 Otitis media, unspecified, right ear: Secondary | ICD-10-CM | POA: Diagnosis not present

## 2024-02-16 LAB — POCT URINE DIPSTICK
Glucose, UA: NEGATIVE mg/dL
Leukocytes, UA: NEGATIVE
Nitrite, UA: NEGATIVE
Spec Grav, UA: >=1.03 — AB (ref 1.010–1.025)
Urobilinogen, UA: 0.2 U/dL
pH, UA: 5.5 (ref 5.0–8.0)

## 2024-02-16 MED ORDER — CEFDINIR 300 MG PO CAPS
600.0000 mg | ORAL_CAPSULE | Freq: Every day | ORAL | 0 refills | Status: AC
  Filled 2024-02-16: qty 14, 7d supply, fill #0

## 2024-02-16 MED ORDER — BENZONATATE 100 MG PO CAPS
100.0000 mg | ORAL_CAPSULE | Freq: Three times a day (TID) | ORAL | 0 refills | Status: DC | PRN
Start: 2024-02-16 — End: 2024-03-21
  Filled 2024-02-16: qty 21, 7d supply, fill #0

## 2024-02-16 MED ORDER — PREDNISONE 20 MG PO TABS
40.0000 mg | ORAL_TABLET | Freq: Every day | ORAL | 0 refills | Status: AC
  Filled 2024-02-16: qty 10, 5d supply, fill #0

## 2024-02-16 NOTE — ED Provider Notes (Signed)
 [name]    CSN: [id number] Arrival date & time: 02/16/24  0800      History   Chief Complaint Chief Complaint  Patient presents with   Cough    HPI Kendra Gonzalez is a 53 y.o. female.    Cough  Here for cough, nasal congestion and rhinorrhea, and sore throat.  Symptoms began on the evening of October 19.  She has not had any fever or nausea or vomiting or diarrhea.  She was seen by her primary care yesterday and COVID and flu test were negative.  Yesterday she started having some pain in her left low back.  It is exacerbated by moving and by coughing.  No dysuria or urinary frequency.  She has also had some sinus pressure sometimes on the right and sometimes on the left.  She has had some right ear pain and discomfort.  She is allergic to Dilaudid .  She has had a hysterectomy  Past medical history is significant for asthma, but she is not sure she is really having any trouble with the right now.  Past Medical History:  Diagnosis Date   Allergy    Arthritis    Asthma    Cataract    bilateral eyes   Endometriosis    GERD (gastroesophageal reflux disease)    Headache    migraines   History of tetanus, diphtheria, and acellular pertussis booster vaccination (Tdap)    07/30/2010   Hyperlipidemia    Kidney stones    Pancreatitis     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   Unilateral primary osteoarthritis, left knee 07/16/2020   Presence of left artificial hip joint 07/09/2017   Multinodular goiter 04/17/2015   History of stomach ulcers 03/22/2015   Chronic pain of left heel 04/23/2014   Chronic pain syndrome 03/19/2014    Asthma 01/04/2013   BMI 29.0-29.9,adult 10/12/2011   Paroxysmal nerve pain 01/22/2011   Hyperlipidemia 02/16/2007   Allergic rhinitis 02/16/2007   GERD 02/16/2007    Past Surgical History:  Procedure Laterality Date   ABDOMINAL HYSTERECTOMY     arthroscopic knee surgery      carpel tunnel     CHOLECYSTECTOMY     ERCP  01/08/2012   Procedure: ENDOSCOPIC RETROGRADE CHOLANGIOPANCREATOGRAPHY (ERCP);  Surgeon: Belvie JONETTA Just, MD;  Location: [facility] ENDOSCOPY;  Service: Endoscopy;  Laterality: N/A;   HIP ARTHROPLASTY     left   HIP SURGERY     oophrectomy  2011   secondary to cysts   THYROIDECTOMY N/A 11/21/2020   Procedure: TOTAL THYROIDECTOMY;  Surgeon: Eletha Boas, MD;  Location: [facility] ORS;  Service: General;  Laterality: N/A;   TUBAL LIGATION     UPPER GASTROINTESTINAL ENDOSCOPY  2016    OB History   No obstetric history on file.      Home Medications    Prior to Admission medications   Medication Sig Start Date End Date Taking? Authorizing Provider  benzonatate  (TESSALON ) 100 MG capsule Take 1 capsule (100 mg total) by mouth 3 (three) times daily as needed for cough. 02/16/24  Yes Zetha Kuhar K, MD  cefdinir (OMNICEF) 300 MG capsule Take 2 capsules (600 mg total) by mouth daily  for 7 days. 02/16/24 02/23/24 Yes Vonna Sharlet POUR, MD  predniSONE  (DELTASONE ) 20 MG tablet Take 2 tablets (40 mg total) by mouth daily with breakfast for 5 days. 02/16/24 02/21/24 Yes Aliany Fiorenza, Sharlet POUR, MD  Vitamin D , Ergocalciferol , (DRISDOL ) 1.25 MG (50000 UNIT) CAPS capsule Take 50,000 Units by mouth once a week. 10/17/23  Yes [provider]  acetaminophen  (TYLENOL ) 500 MG tablet Take 1,000 mg by mouth every 8 (eight) hours as needed for moderate pain.    [provider]  acetaminophen -codeine  (TYLENOL  #3) 300-30 MG tablet Take 1 tablet by mouth every 6 (six) hours as needed for moderate pain (pain score 4-6). 07/22/23   Agarwala, Gildardo, MD  albuterol  (VENTOLIN  HFA) 108 (90 Base)  MCG/ACT inhaler Inhale 1-2 puffs into the lungs every 6 (six) hours as needed. 01/07/24   Vivienne Delon HERO, PA-C  Atogepant  (QULIPTA ) 60 MG TABS Take 1 tablet (60 mg total) by mouth daily. 01/17/24   Skeet Juliene SAUNDERS, DO  atorvastatin  (LIPITOR) 40 MG tablet Take 1 tablet (40 mg total) by mouth daily. 12/23/23   Theophilus Andrews, Tully GRADE, MD  azelastine  (ASTELIN ) 0.1 % nasal spray Place 1 spray into both nostrils 2 (two) times daily. Use in each nostril as directed 09/04/22   Crain, Whitney L, PA  baclofen  (LIORESAL ) 10 MG tablet Take 1 tablet (10 mg total) by mouth 2 (two) times daily as needed (migraines). 01/13/22   Jodie Lavern CROME, MD  cyanocobalamin  (VITAMIN B12) 1000 MCG/ML injection Inject 1 ml into the muscle once a week for a month. Then injected 1 ml once a month thereafter. 05/19/23   Theophilus Andrews, Tully GRADE, MD  fluticasone -salmeterol (ADVAIR  HFA) 115-21 MCG/ACT inhaler Inhale 2 puffs into the lungs 2 (two) times daily. 03/19/21   Theophilus Andrews, Tully GRADE, MD  fluticasone -salmeterol (ADVAIR ) 100-50 MCG/ACT AEPB Inhale 1 puff into the lungs 2 (two) times daily. 08/07/23 02/15/24  Blair, Diane W, FNP  levothyroxine  (SYNTHROID ) 88 MCG tablet Take 1 tablet (88 mcg total) by mouth daily. 01/06/24   Shamleffer, Ibtehal Jaralla, MD  Magnesium  Gluconate 550 MG TABS TAKE 1 TABLET BY MOUTH AT BEDTIME. 02/11/22   Theophilus Andrews, Tully GRADE, MD  montelukast  (SINGULAIR ) 10 MG tablet Take 1 tablet (10 mg total) by mouth at bedtime. 09/04/22   Crain, Whitney L, PA  ondansetron  (ZOFRAN ) 4 MG tablet Take 1 tablet (4 mg total) by mouth every 6 (six) hours. 01/17/24   Skeet Juliene SAUNDERS, DO  SUMAtriptan  (IMITREX ) 20 MG/ACT nasal spray Place 1 spray (20 mg total) into the nose as needed for migraine or headache. May repeat in 2 hours if headache persists or recurs.  Maximum 2 sprays in 24 hours. 01/17/24   Skeet Juliene SAUNDERS, DO  SYRINGE-NEEDLE, DISP, 3 ML (BD SAFETYGLIDE SYRINGE/NEEDLE) 25G X 1 3 ML MISC Use as directed  05/19/23   Theophilus Andrews, Estela Y, MD  triamcinolone  cream (KENALOG ) 0.1 % Apply 1 Application topically 2 (two) times daily. 02/03/24   Theophilus Andrews, Tully GRADE, MD  methimazole  (TAPAZOLE ) 10 MG tablet Take 1 tablet (10 mg total) by mouth 2 (two) times daily. Patient not taking: Reported on 10/29/2020 08/06/20 11/21/20  Kassie Mallick, MD    Family History Family History  Problem Relation Age of Onset   Hypertension Father    Hypertension Sister    Hypertension Sister    Heart disease Daughter    Hypertension Maternal Grandfather    Heart disease Maternal Grandfather    Hypertension Paternal Grandfather  Hypertension Brother    Hypertension Brother    Thyroid  disease Neg Hx    Colon polyps Neg Hx    Colon cancer Neg Hx    Esophageal cancer Neg Hx    Rectal cancer Neg Hx    Stomach cancer Neg Hx    Breast cancer Neg Hx     Social History Social History   Tobacco Use   Smoking status: Never   Smokeless tobacco: Never  Vaping Use   Vaping status: Never Used  Substance Use Topics   Alcohol use: No   Drug use: No     Allergies   Hydromorphone    Review of Systems Review of Systems  Respiratory:  Positive for cough.      Physical Exam Triage Vital Signs ED Triage Vitals  Encounter Vitals Group     BP 02/16/24 0823 (!) 149/95     Girls Systolic BP Percentile --      Girls Diastolic BP Percentile --      Boys Systolic BP Percentile --      Boys Diastolic BP Percentile --      Pulse Rate 02/16/24 0823 (!) 107     Resp 02/16/24 0823 20     Temp 02/16/24 0823 98.6 F (37 C)     Temp Source 02/16/24 0823 Oral     SpO2 02/16/24 0823 97 %     Weight 02/16/24 0822 199 lb 1.2 oz (90.3 kg)     Height --      Head Circumference --      Peak Flow --      Pain Score 02/16/24 0821 0     Pain Loc --      Pain Education --      Exclude from Growth Chart --    No data found.  Updated Vital Signs BP (!) 149/95 (BP Location: Left Arm)   Pulse (!) 107   Temp 98.6  F (37 C) (Oral)   Resp 20   Wt 90.3 kg   LMP 06/06/1996 Comment: GYN  SpO2 97%   BMI 34.17 kg/m   Visual Acuity Right Eye Distance:   Left Eye Distance:   Bilateral Distance:    Right Eye Near:   Left Eye Near:    Bilateral Near:     Physical Exam Vitals reviewed.  Constitutional:      General: She is not in acute distress.    Appearance: She is not toxic-appearing.     Comments: She is having a staccato cough while I talk with her in the room.  HENT:     Right Ear: Ear canal normal.     Left Ear: Ear canal normal.     Ears:     Comments: Both tympanic membranes are dull and the right one is slightly pink.    Nose: Nose normal.     Mouth/Throat:     Mouth: Mucous membranes are moist.     Comments: There is very mild erythema of the tonsillar pillars and clear mucus is draining in the oropharynx. Eyes:     Extraocular Movements: Extraocular movements intact.     Conjunctiva/sclera: Conjunctivae normal.     Pupils: Pupils are equal, round, and reactive to light.  Cardiovascular:     Rate and Rhythm: Normal rate and regular rhythm.     Heart sounds: No murmur heard. Pulmonary:     Effort: No respiratory distress.     Breath sounds: No stridor. No wheezing, rhonchi or rales.  Musculoskeletal:     Cervical back: Neck supple.  Lymphadenopathy:     Cervical: No cervical adenopathy.  Skin:    Capillary Refill: Capillary refill takes less than 2 seconds.     Coloration: Skin is not jaundiced or pale.  Neurological:     General: No focal deficit present.     Mental Status: She is alert and oriented to person, place, and time.  Psychiatric:        Behavior: Behavior normal.      UC Treatments / Results  Labs (all labs ordered are listed, but only abnormal results are displayed) Labs Reviewed  POCT URINE DIPSTICK - Abnormal; Notable for the following components:      Result Value   Clarity, UA hazy (*)    Bilirubin, UA small (*)    Ketones, POC UA trace (5) (*)     Spec Grav, UA >=1.030 (*)    Blood, UA trace-intact (*)    All other components within normal limits    EKG   Radiology No results found.  Procedures Procedures (including critical care time)  Medications Ordered in UC Medications - No data to display  Initial Impression / Assessment and Plan / UC Course  I have reviewed the triage vital signs and the nursing notes.  Pertinent labs & imaging results that were available during my care of the patient were reviewed by me and considered in my medical decision making (see chart for details).     She is moving air fairly well on exam, but I do wonder she is having some asthma exacerbation along with her viral URI.  Also I am concerned that she is developing a right otitis media.  Prednisone  is sent in for asthma exacerbation  And Omnicef is sent in for possible otitis media.  I discussed with her that most of her symptoms still could be most likely viral in origin. Tessalon  Perles are sent in for the cough  UA shows a trace of red blood cells and ketones and is concentrated with a specific gravity greater than 1.03.  She has adequate albuterol  at home  Final Clinical Impressions(s) / UC Diagnoses   Final diagnoses:  Left flank pain  Mild intermittent asthma with (acute) exacerbation  Right otitis media, unspecified otitis media type   Discharge Instructions   None    ED Prescriptions     Medication Sig Dispense Auth. Provider   predniSONE  (DELTASONE ) 20 MG tablet Take 2 tablets (40 mg total) by mouth daily with breakfast for 5 days. 10 tablet Vonna Sharlet POUR, MD   benzonatate  (TESSALON ) 100 MG capsule Take 1 capsule (100 mg total) by mouth 3 (three) times daily as needed for cough. 21 capsule Bella Brummet K, MD   cefdinir (OMNICEF) 300 MG capsule Take 2 capsules (600 mg total) by mouth daily for 7 days. 14 capsule Vonna, Madisynn Plair K, MD      PDMP not reviewed this encounter.   Vonna Sharlet POUR,  MD 02/16/24 [phone]

## 2024-02-16 NOTE — Discharge Instructions (Signed)
 The urinalysis was concentrated and had just a trace of red blood cells, most likely just mean you need to drink more fluids.  Take prednisone  20 mg--2 daily for 5 days  Take benzonatate  100 mg, 1 tab every 8 hours as needed for cough.  Take cefdinir 300 mg--2 capsules together daily for 7 days; this is for the possible ear infection

## 2024-02-16 NOTE — ED Triage Notes (Signed)
 Pt presents c/o left flank pain x 1 day. Pt states,  I started feeling bad on Sunday with nasal congestion. I went to my dr yesterday and tested negative for COVID and Flu. They said my throat is a Dabbs red but she didn't give me anything. I was just told to buy OTC meds for the cough. I have taken Sudafed since Sunday and also tried cough syrup OTC but I cannot get rid of this cough.  Pt denies emesis and diarrhea.

## 2024-02-28 ENCOUNTER — Encounter: Payer: Self-pay | Admitting: Radiology

## 2024-03-09 ENCOUNTER — Encounter: Payer: Self-pay | Admitting: Neurology

## 2024-03-10 ENCOUNTER — Other Ambulatory Visit (HOSPITAL_COMMUNITY): Payer: Self-pay

## 2024-03-10 ENCOUNTER — Other Ambulatory Visit: Payer: Self-pay | Admitting: Neurology

## 2024-03-10 ENCOUNTER — Telehealth: Payer: Self-pay | Admitting: Pharmacy Technician

## 2024-03-10 MED ORDER — NURTEC 75 MG PO TBDP: 1.0000 | ORAL_TABLET | Freq: Every day | ORAL | 5 refills | Status: AC | PRN

## 2024-03-10 NOTE — Telephone Encounter (Signed)
 Pharmacy Patient Advocate Encounter   Received notification from Patient Advice Request messages that prior authorization for NURTEC 75MG  is required/requested.   Insurance verification completed.   The patient is insured through [facility].   Per test claim: PA required; PA submitted to above mentioned insurance via Latent Key/confirmation #/EOC [id number] Status is pending

## 2024-03-10 NOTE — Telephone Encounter (Signed)
 Pharmacy Patient Advocate Encounter  Received notification from [employer] that Prior Authorization for NURTEC 75MG  has been APPROVED from 11.14.25 to 11.14.26 with QUANTITY LIMIT of 8 for a 30 day supply. $0 COPAY   PA #/Case ID/Reference #: [id number]

## 2024-03-10 NOTE — Telephone Encounter (Signed)
 PA has been submitted, and telephone encounter has been created. Please see telephone encounter dated 11.14.25.

## 2024-03-21 ENCOUNTER — Ambulatory Visit: Admitting: Internal Medicine

## 2024-03-21 ENCOUNTER — Ambulatory Visit: Payer: Self-pay | Admitting: Internal Medicine

## 2024-03-21 ENCOUNTER — Encounter: Payer: Self-pay | Admitting: Internal Medicine

## 2024-03-21 VITALS — BP 128/78 | HR 66 | Temp 97.5°F | Wt 196.6 lb

## 2024-03-21 DIAGNOSIS — E559 Vitamin D deficiency, unspecified: Secondary | ICD-10-CM | POA: Diagnosis not present

## 2024-03-21 DIAGNOSIS — I10 Essential (primary) hypertension: Secondary | ICD-10-CM

## 2024-03-21 DIAGNOSIS — E782 Mixed hyperlipidemia: Secondary | ICD-10-CM | POA: Diagnosis not present

## 2024-03-21 DIAGNOSIS — R7302 Impaired glucose tolerance (oral): Secondary | ICD-10-CM | POA: Diagnosis not present

## 2024-03-21 DIAGNOSIS — E785 Hyperlipidemia, unspecified: Secondary | ICD-10-CM | POA: Diagnosis not present

## 2024-03-21 DIAGNOSIS — E890 Postprocedural hypothyroidism: Secondary | ICD-10-CM

## 2024-03-21 DIAGNOSIS — Z0000 Encounter for general adult medical examination without abnormal findings: Secondary | ICD-10-CM | POA: Diagnosis not present

## 2024-03-21 DIAGNOSIS — E538 Deficiency of other specified B group vitamins: Secondary | ICD-10-CM | POA: Diagnosis not present

## 2024-03-21 LAB — LIPID PANEL
Cholesterol: 205 mg/dL — ABNORMAL HIGH (ref 0–200)
HDL: 66 mg/dL (ref >39.00)
LDL Cholesterol: 128 mg/dL — ABNORMAL HIGH (ref 0–99)
NonHDL: 138.73
Total CHOL/HDL Ratio: 3
Triglycerides: 55 mg/dL (ref 0.0–149.0)
VLDL: 11 mg/dL (ref 0.0–40.0)

## 2024-03-21 LAB — COMPREHENSIVE METABOLIC PANEL WITH GFR
ALT: 9 U/L (ref 0–35)
AST: 15 U/L (ref 0–37)
Albumin: 4.6 g/dL (ref 3.5–5.2)
Alkaline Phosphatase: 110 U/L (ref 39–117)
BUN: 8 mg/dL (ref 6–23)
CO2: 29 meq/L (ref 19–32)
Calcium: 10.1 mg/dL (ref 8.4–10.5)
Chloride: 103 meq/L (ref 96–112)
Creatinine, Ser: 0.88 mg/dL (ref 0.40–1.20)
GFR: 74.15 mL/min (ref >60.00)
Glucose, Bld: 83 mg/dL (ref 70–99)
Potassium: 4.3 meq/L (ref 3.5–5.1)
Sodium: 140 meq/L (ref 135–145)
Total Bilirubin: 1.1 mg/dL (ref 0.2–1.2)
Total Protein: 7.3 g/dL (ref 6.0–8.3)

## 2024-03-21 LAB — CBC WITH DIFFERENTIAL/PLATELET
Basophils Absolute: 0.1 K/uL (ref 0.0–0.1)
Basophils Relative: 0.9 % (ref 0.0–3.0)
Eosinophils Absolute: 0.1 K/uL (ref 0.0–0.7)
Eosinophils Relative: 2.3 % (ref 0.0–5.0)
HCT: 44.3 % (ref 36.0–46.0)
Hemoglobin: 14.7 g/dL (ref 12.0–15.0)
Lymphocytes Relative: 39.4 % (ref 12.0–46.0)
Lymphs Abs: 2.4 K/uL (ref 0.7–4.0)
MCHC: 33.3 g/dL (ref 30.0–36.0)
MCV: 88.2 fl (ref 78.0–100.0)
Monocytes Absolute: 0.4 K/uL (ref 0.1–1.0)
Monocytes Relative: 6.1 % (ref 3.0–12.0)
Neutro Abs: 3.2 K/uL (ref 1.4–7.7)
Neutrophils Relative %: 51.3 % (ref 43.0–77.0)
Platelets: 252 K/uL (ref 150.0–400.0)
RBC: 5.02 Mil/uL (ref 3.87–5.11)
RDW: 14.1 % (ref 11.5–15.5)
WBC: 6.2 K/uL (ref 4.0–10.5)

## 2024-03-21 LAB — TSH: TSH: 0.1 u[IU]/mL — ABNORMAL LOW (ref 0.35–5.50)

## 2024-03-21 LAB — VITAMIN D 25 HYDROXY (VIT D DEFICIENCY, FRACTURES): VITD: 28.43 ng/mL — ABNORMAL LOW (ref 30.00–100.00)

## 2024-03-21 LAB — VITAMIN B12: Vitamin B-12: 763 pg/mL (ref 211–911)

## 2024-03-21 LAB — HEMOGLOBIN A1C: Hgb A1c MFr Bld: 5.6 % (ref 4.6–6.5)

## 2024-03-21 MED ORDER — VITAMIN D (ERGOCALCIFEROL) 1.25 MG (50000 UNIT) PO CAPS: 50000.0000 [IU] | ORAL_CAPSULE | ORAL | 0 refills | Status: AC

## 2024-03-21 MED ORDER — LEVOTHYROXINE SODIUM 75 MCG PO TABS
75.0000 ug | ORAL_TABLET | Freq: Every day | ORAL | 1 refills | Status: AC
Start: 2024-03-21 — End: ?

## 2024-03-21 NOTE — Progress Notes (Signed)
 Established Patient Office Visit     CC/Reason for Visit: Annual preventive exam  HPI: Kendra Gonzalez is a 53 y.o. female who is coming in today for the above mentioned reasons. Past Medical History is significant for: Impaired glucose tolerance, hypertension, hypothyroidism, vitamin D  and B12 deficiencies, asthma and migraine headaches.  Feeling well.  No acute concerns or complaints.  Has routine eye and dental care.  Is due for flu, COVID, pneumonia vaccines.  All cancer screening is up-to-date.   Past Medical/Surgical History: Past Medical History:  Diagnosis Date   Allergy    Arthritis    Asthma    Cataract    bilateral eyes   Endometriosis    GERD (gastroesophageal reflux disease)    Headache    migraines   History of tetanus, diphtheria, and acellular pertussis booster vaccination (Tdap)    07/30/2010   Hyperlipidemia    Kidney stones    Pancreatitis     Past Surgical History:  Procedure Laterality Date   ABDOMINAL HYSTERECTOMY     arthroscopic knee surgery      carpel tunnel     CHOLECYSTECTOMY     ERCP  01/08/2012   Procedure: ENDOSCOPIC RETROGRADE CHOLANGIOPANCREATOGRAPHY (ERCP);  Surgeon: Belvie JONETTA Just, MD;  Location: [facility] ENDOSCOPY;  Service: Endoscopy;  Laterality: N/A;   HIP ARTHROPLASTY     left   HIP SURGERY     oophrectomy  2011   secondary to cysts   THYROIDECTOMY N/A 11/21/2020   Procedure: TOTAL THYROIDECTOMY;  Surgeon: Eletha Boas, MD;  Location: [facility];  Service: General;  Laterality: N/A;   TUBAL LIGATION     UPPER GASTROINTESTINAL ENDOSCOPY  2016    Social History:  reports that she has never smoked. She has never used smokeless tobacco. She reports that she does not drink alcohol and does not use drugs.  Allergies: Allergies  Allergen Reactions   Hydromorphone  Itching    Family History:  Family History  Problem Relation Age of Onset   Hypertension Father    Hypertension Sister    Hypertension Sister    Heart  disease Daughter    Hypertension Maternal Grandfather    Heart disease Maternal Grandfather    Hypertension Paternal Grandfather    Hypertension Brother    Hypertension Brother    Thyroid  disease Neg Hx    Colon polyps Neg Hx    Colon cancer Neg Hx    Esophageal cancer Neg Hx    Rectal cancer Neg Hx    Stomach cancer Neg Hx    Breast cancer Neg Hx      Current Outpatient Medications:    acetaminophen  (TYLENOL ) 500 MG tablet, Take 1,000 mg by mouth every 8 (eight) hours as needed for moderate pain., Disp: , Rfl:    acetaminophen -codeine  (TYLENOL  #3) 300-30 MG tablet, Take 1 tablet by mouth every 6 (six) hours as needed for moderate pain (pain score 4-6)., Disp: 26 tablet, Rfl: 0   albuterol  (VENTOLIN  HFA) 108 (90 Base) MCG/ACT inhaler, Inhale 1-2 puffs into the lungs every 6 (six) hours as needed., Disp: 6.7 g, Rfl: 0   Atogepant  (QULIPTA ) 60 MG TABS, Take 1 tablet (60 mg total) by mouth daily., Disp: 30 tablet, Rfl: 11   atorvastatin  (LIPITOR) 40 MG tablet, Take 1 tablet (40 mg total) by mouth daily., Disp: 90 tablet, Rfl: 1   baclofen  (LIORESAL ) 10 MG tablet, Take 1 tablet (10 mg total) by mouth 2 (two) times daily as needed (migraines).,  Disp: 90 each, Rfl: 1   cyanocobalamin  (VITAMIN B12) 1000 MCG/ML injection, Inject 1 ml into the muscle once a week for a month. Then injected 1 ml once a month thereafter., Disp: 6 mL, Rfl: 11   fluticasone -salmeterol (ADVAIR  HFA) 115-21 MCG/ACT inhaler, Inhale 2 puffs into the lungs 2 (two) times daily., Disp: 1 each, Rfl: 2   fluticasone -salmeterol (ADVAIR ) 100-50 MCG/ACT AEPB, Inhale 1 puff into the lungs 2 (two) times daily., Disp: 60 each, Rfl: 0   levothyroxine  (SYNTHROID ) 88 MCG tablet, Take 1 tablet (88 mcg total) by mouth daily., Disp: 90 tablet, Rfl: 3   Magnesium  Gluconate 550 MG TABS, TAKE 1 TABLET BY MOUTH AT BEDTIME., Disp: 90 tablet, Rfl: 0   montelukast  (SINGULAIR ) 10 MG tablet, Take 1 tablet (10 mg total) by mouth at bedtime., Disp: 30  tablet, Rfl: 0   ondansetron  (ZOFRAN ) 4 MG tablet, Take 1 tablet (4 mg total) by mouth every 6 (six) hours., Disp: 20 tablet, Rfl: 11   Rimegepant Sulfate (NURTEC) 75 MG TBDP, Take 1 tablet (75 mg total) by mouth daily as needed., Disp: 16 tablet, Rfl: 5   SYRINGE-NEEDLE, DISP, 3 ML (BD SAFETYGLIDE SYRINGE/NEEDLE) 25G X 1 3 ML MISC, Use as directed, Disp: 100 each, Rfl: 3   triamcinolone  cream (KENALOG ) 0.1 %, Apply 1 Application topically 2 (two) times daily., Disp: 30 g, Rfl: 0   Vitamin D , Ergocalciferol , (DRISDOL ) 1.25 MG (50000 UNIT) CAPS capsule, Take 50,000 Units by mouth once a week., Disp: , Rfl:    azelastine  (ASTELIN ) 0.1 % nasal spray, Place 1 spray into both nostrils 2 (two) times daily. Use in each nostril as directed, Disp: 30 mL, Rfl: 0   SUMAtriptan  (IMITREX ) 20 MG/ACT nasal spray, Place 1 spray (20 mg total) into the nose as needed for migraine or headache. May repeat in 2 hours if headache persists or recurs.  Maximum 2 sprays in 24 hours., Disp: 6 each, Rfl: 11  Review of Systems:  Negative unless indicated in HPI.   Physical Exam: Vitals:   03/21/24 0847 03/21/24 0852  BP: 130/88 128/78  Pulse: 66   Temp: (!) 97.5 F (36.4 C)   TempSrc: Oral   SpO2: 99%   Weight: 196 lb 9.6 oz (89.2 kg)     Body mass index is 33.75 kg/m.   Physical Exam Vitals reviewed.  Constitutional:      General: She is not in acute distress.    Appearance: Normal appearance. She is not ill-appearing, toxic-appearing or diaphoretic.  HENT:     Head: Normocephalic.     Right Ear: Tympanic membrane, ear canal and external ear normal. There is no impacted cerumen.     Left Ear: Tympanic membrane, ear canal and external ear normal. There is no impacted cerumen.     Nose: Nose normal.     Mouth/Throat:     Mouth: Mucous membranes are moist.     Pharynx: Oropharynx is clear. No oropharyngeal exudate or posterior oropharyngeal erythema.  Eyes:     General: No scleral icterus.       Right  eye: No discharge.        Left eye: No discharge.     Conjunctiva/sclera: Conjunctivae normal.     Pupils: Pupils are equal, round, and reactive to light.  Neck:     Vascular: No carotid bruit.  Cardiovascular:     Rate and Rhythm: Normal rate and regular rhythm.     Pulses: Normal pulses.  Heart sounds: Normal heart sounds.  Pulmonary:     Effort: Pulmonary effort is normal. No respiratory distress.     Breath sounds: Normal breath sounds.  Abdominal:     General: Abdomen is flat. Bowel sounds are normal.     Palpations: Abdomen is soft.  Musculoskeletal:        General: Normal range of motion.     Cervical back: Normal range of motion.  Skin:    General: Skin is warm and dry.  Neurological:     General: No focal deficit present.     Mental Status: She is alert and oriented to person, place, and time. Mental status is at baseline.  Psychiatric:        Mood and Affect: Mood normal.        Behavior: Behavior normal.        Thought Content: Thought content normal.        Judgment: Judgment normal.     Flowsheet Row Office Visit from 03/21/2024 in [facility] [place]  PHQ-9 Total Score 2     Impression and Plan:  Encounter for preventive health examination  IGT (impaired glucose tolerance) -     Hemoglobin A1c; Future  Vitamin B12 deficiency -     Vitamin B12; Future  Hyperlipidemia, unspecified hyperlipidemia type  Vitamin D  deficiency -     VITAMIN D  25 Hydroxy (Vit-D Deficiency, Fractures); Future  Postoperative hypothyroidism -     TSH; Future  Mixed hyperlipidemia -     Lipid panel; Future  Essential hypertension -     CBC with Differential/Platelet; Future -     Comprehensive metabolic panel with GFR; Future    -Recommend routine eye and dental care. -Healthy lifestyle discussed in detail. -Labs to be updated today. -Prostate cancer screening: Not applicable Health Maintenance  Topic Date Due   Pneumococcal Vaccine  for age over 35 (1 of 2 - PCV) Never done   Hepatitis B Vaccine (1 of 3 - 19+ 3-dose series) Never done   COVID-19 Vaccine (5 - 2025-26 season) 12/27/2023   Breast Cancer Screening  02/14/2025   Colon Cancer Screening  11/09/2029   DTaP/Tdap/Td vaccine (8 - Td or Tdap) 04/18/2031   Flu Shot  Completed   Hepatitis C Screening  Completed   HIV Screening  Completed   Zoster (Shingles) Vaccine  Completed   HPV Vaccine  Aged Out   Meningitis B Vaccine  Aged Out    - Declines vaccines today despite counseling.    Tully Theophilus Andrews, MD [facility]

## 2024-03-22 ENCOUNTER — Encounter: Admitting: Internal Medicine

## 2024-03-27 ENCOUNTER — Ambulatory Visit: Admitting: Internal Medicine

## 2024-04-21 ENCOUNTER — Encounter: Payer: Self-pay | Admitting: Internal Medicine

## 2024-04-21 ENCOUNTER — Other Ambulatory Visit (HOSPITAL_COMMUNITY): Payer: Self-pay

## 2024-04-21 ENCOUNTER — Ambulatory Visit: Admitting: Internal Medicine

## 2024-04-21 ENCOUNTER — Other Ambulatory Visit: Payer: Self-pay

## 2024-04-21 VITALS — BP 128/82 | HR 81 | Temp 98.2°F | Ht 64.0 in | Wt 195.4 lb

## 2024-04-21 DIAGNOSIS — J069 Acute upper respiratory infection, unspecified: Secondary | ICD-10-CM | POA: Diagnosis not present

## 2024-04-21 DIAGNOSIS — J4531 Mild persistent asthma with (acute) exacerbation: Secondary | ICD-10-CM

## 2024-04-21 DIAGNOSIS — J45909 Unspecified asthma, uncomplicated: Secondary | ICD-10-CM

## 2024-04-21 DIAGNOSIS — J4530 Mild persistent asthma, uncomplicated: Secondary | ICD-10-CM

## 2024-04-21 DIAGNOSIS — E538 Deficiency of other specified B group vitamins: Secondary | ICD-10-CM

## 2024-04-21 MED ORDER — ALBUTEROL SULFATE HFA 108 (90 BASE) MCG/ACT IN AERS
1.0000 | INHALATION_SPRAY | Freq: Four times a day (QID) | RESPIRATORY_TRACT | 3 refills | Status: AC | PRN
  Filled 2024-04-21: qty 6.7, 25d supply, fill #0

## 2024-04-21 MED ORDER — PROMETHAZINE-DM 6.25-15 MG/5ML PO SYRP
5.0000 mL | ORAL_SOLUTION | Freq: Four times a day (QID) | ORAL | 0 refills | Status: AC | PRN
  Filled 2024-04-21: qty 240, 12d supply, fill #0

## 2024-04-21 MED ORDER — FLUTICASONE-SALMETEROL 115-21 MCG/ACT IN AERO
2.0000 | INHALATION_SPRAY | Freq: Two times a day (BID) | RESPIRATORY_TRACT | 5 refills | Status: AC
  Filled 2024-04-21: qty 12, 30d supply, fill #0

## 2024-04-21 MED ORDER — AZITHROMYCIN 250 MG PO TABS
ORAL_TABLET | ORAL | 0 refills | Status: AC
  Filled 2024-04-21: qty 6, 5d supply, fill #0

## 2024-04-21 MED ORDER — METHYLPREDNISOLONE 4 MG PO TBPK
ORAL_TABLET | ORAL | 0 refills | Status: AC
  Filled 2024-04-21: qty 21, 6d supply, fill #0

## 2024-04-21 MED ORDER — ALBUTEROL SULFATE (2.5 MG/3ML) 0.083% IN NEBU
2.5000 mg | INHALATION_SOLUTION | RESPIRATORY_TRACT | 1 refills | Status: AC | PRN
  Filled 2024-04-21: qty 150, 9d supply, fill #0

## 2024-04-21 NOTE — Assessment & Plan Note (Addendum)
 Worse due to URI Start Albuterol  MDI or HHN prn Tessalon  prn Medrol  pack if worse Re-start Advair 

## 2024-04-21 NOTE — Progress Notes (Signed)
 "  Subjective:  Patient ID: Kendra Gonzalez, female    DOB: November 19, 1970  Age: 53 y.o. MRN: [id number]  CC: Sinus Problem (Body aches, sneezing, sinus pressure, symptoms since Sunday)   HPI Kendra Gonzalez presents for URI sx's x5 days C/o severe cough x 3 d - dry Tessalon  did not help H/o asthma - out of asthma meds COVID, flu tests (-). Pt is an ER tech  Outpatient Medications Prior to Visit  Medication Sig Dispense Refill   acetaminophen  (TYLENOL ) 500 MG tablet Take 1,000 mg by mouth every 8 (eight) hours as needed for moderate pain.     acetaminophen -codeine  (TYLENOL  #3) 300-30 MG tablet Take 1 tablet by mouth every 6 (six) hours as needed for moderate pain (pain score 4-6). 26 tablet 0   Atogepant  (QULIPTA ) 60 MG TABS Take 1 tablet (60 mg total) by mouth daily. 30 tablet 11   atorvastatin  (LIPITOR) 40 MG tablet Take 1 tablet (40 mg total) by mouth daily. 90 tablet 1   baclofen  (LIORESAL ) 10 MG tablet Take 1 tablet (10 mg total) by mouth 2 (two) times daily as needed (migraines). 90 each 1   cyanocobalamin  (VITAMIN B12) 1000 MCG/ML injection Inject 1 ml into the muscle once a week for a month. Then injected 1 ml once a month thereafter. 6 mL 11   fluticasone -salmeterol (ADVAIR ) 100-50 MCG/ACT AEPB Inhale 1 puff into the lungs 2 (two) times daily. 60 each 0   levothyroxine  (SYNTHROID ) 75 MCG tablet Take 1 tablet (75 mcg total) by mouth daily. 90 tablet 1   Magnesium  Gluconate 550 MG TABS TAKE 1 TABLET BY MOUTH AT BEDTIME. 90 tablet 0   montelukast  (SINGULAIR ) 10 MG tablet Take 1 tablet (10 mg total) by mouth at bedtime. 30 tablet 0   ondansetron  (ZOFRAN ) 4 MG tablet Take 1 tablet (4 mg total) by mouth every 6 (six) hours. 20 tablet 11   Rimegepant Sulfate (NURTEC) 75 MG TBDP Take 1 tablet (75 mg total) by mouth daily as needed. 16 tablet 5   SYRINGE-NEEDLE, DISP, 3 ML (BD SAFETYGLIDE SYRINGE/NEEDLE) 25G X 1 3 ML MISC Use as directed 100 each 3   triamcinolone  cream  (KENALOG ) 0.1 % Apply 1 Application topically 2 (two) times daily. 30 g 0   Vitamin D , Ergocalciferol , (DRISDOL ) 1.25 MG (50000 UNIT) CAPS capsule Take 1 capsule (50,000 Units total) by mouth every 7 (seven) days for 12 doses. 12 capsule 0   albuterol  (VENTOLIN  HFA) 108 (90 Base) MCG/ACT inhaler Inhale 1-2 puffs into the lungs every 6 (six) hours as needed. 6.7 g 0   fluticasone -salmeterol (ADVAIR  HFA) 115-21 MCG/ACT inhaler Inhale 2 puffs into the lungs 2 (two) times daily. 1 each 2   azelastine  (ASTELIN ) 0.1 % nasal spray Place 1 spray into both nostrils 2 (two) times daily. Use in each nostril as directed 30 mL 0   SUMAtriptan  (IMITREX ) 20 MG/ACT nasal spray Place 1 spray (20 mg total) into the nose as needed for migraine or headache. May repeat in 2 hours if headache persists or recurs.  Maximum 2 sprays in 24 hours. 6 each 11   No facility-administered medications prior to visit.    ROS: Review of Systems  Constitutional:  Positive for fatigue. Negative for activity change, appetite change, chills and unexpected weight change.  HENT:  Positive for congestion and sneezing. Negative for mouth sores, sinus pressure and voice change.   Eyes:  Negative for visual disturbance.  Respiratory:  Positive for cough and wheezing.  Negative for chest tightness.   Gastrointestinal:  Negative for abdominal pain and nausea.  Genitourinary:  Negative for difficulty urinating, frequency and vaginal pain.  Musculoskeletal:  Negative for back pain and gait problem.  Skin:  Negative for pallor and rash.  Neurological:  Negative for dizziness, tremors, weakness, numbness and headaches.  Psychiatric/Behavioral:  Negative for confusion and sleep disturbance.     Objective:  BP 128/82   Pulse 81   Temp 98.2 F (36.8 C)   Ht 5' 4 (1.626 m)   Wt 195 lb 6.4 oz (88.6 kg)   LMP 06/06/1996 Comment: GYN  SpO2 99%   BMI 33.54 kg/m   BP Readings from Last 3 Encounters:  04/21/24 128/82  03/21/24 128/78   02/16/24 (!) 149/95    Wt Readings from Last 3 Encounters:  04/21/24 195 lb 6.4 oz (88.6 kg)  03/21/24 196 lb 9.6 oz (89.2 kg)  02/16/24 199 lb 1.2 oz (90.3 kg)    Physical Exam Constitutional:      General: She is not in acute distress.    Appearance: She is well-developed. She is obese.  HENT:     Head: Normocephalic.     Right Ear: External ear normal.     Left Ear: External ear normal.     Nose: Nose normal.  Eyes:     General:        Right eye: No discharge.        Left eye: No discharge.     Conjunctiva/sclera: Conjunctivae normal.     Pupils: Pupils are equal, round, and reactive to light.  Neck:     Thyroid : No thyromegaly.     Vascular: No JVD.     Trachea: No tracheal deviation.  Cardiovascular:     Rate and Rhythm: Normal rate and regular rhythm.     Heart sounds: Normal heart sounds.  Pulmonary:     Effort: No respiratory distress.     Breath sounds: No stridor. No wheezing.  Abdominal:     General: Bowel sounds are normal. There is no distension.     Palpations: Abdomen is soft. There is no mass.     Tenderness: There is no abdominal tenderness. There is no guarding or rebound.  Musculoskeletal:        General: No tenderness.     Cervical back: Normal range of motion and neck supple. No rigidity.  Lymphadenopathy:     Cervical: No cervical adenopathy.  Skin:    Findings: No erythema or rash.  Neurological:     Cranial Nerves: No cranial nerve deficit.     Motor: No abnormal muscle tone.     Coordination: Coordination normal.     Deep Tendon Reflexes: Reflexes normal.  Psychiatric:        Behavior: Behavior normal.        Thought Content: Thought content normal.        Judgment: Judgment normal.   Coughing hard  Lab Results  Component Value Date   WBC 6.2 03/21/2024   HGB 14.7 03/21/2024   HCT 44.3 03/21/2024   PLT 252.0 03/21/2024   GLUCOSE 83 03/21/2024   CHOL 205 (H) 03/21/2024   TRIG 55.0 03/21/2024   HDL 66.00 03/21/2024    LDLDIRECT 161.8 10/20/2006   LDLCALC 128 (H) 03/21/2024   ALT 9 03/21/2024   AST 15 03/21/2024   NA 140 03/21/2024   K 4.3 03/21/2024   CL 103 03/21/2024   CREATININE 0.88 03/21/2024   BUN 8  03/21/2024   CO2 29 03/21/2024   TSH 0.10 (L) 03/21/2024   HGBA1C 5.6 03/21/2024    No results found.  Assessment & Plan:   Problem List Items Addressed This Visit     Asthma - Primary   Worse due to URI Start Albuterol  MDI or HHN prn Tessalon  prn Medrol  pack if worse Re-start Advair       Relevant Medications   methylPREDNISolone  (MEDROL  DOSEPAK) 4 MG TBPK tablet   albuterol  (VENTOLIN  HFA) 108 (90 Base) MCG/ACT inhaler   fluticasone -salmeterol (ADVAIR  HFA) 115-21 MCG/ACT inhaler   albuterol  (PROVENTIL ) (2.5 MG/3ML) 0.083% nebulizer solution   Upper respiratory infection   Zpack, Prom cough syr      Relevant Medications   azithromycin  (ZITHROMAX  Z-PAK) 250 MG tablet   Vitamin B12 deficiency   Re-start Vit B12      Other Visit Diagnoses       Mild persistent asthma with exacerbation       Relevant Medications   methylPREDNISolone  (MEDROL  DOSEPAK) 4 MG TBPK tablet   albuterol  (VENTOLIN  HFA) 108 (90 Base) MCG/ACT inhaler   fluticasone -salmeterol (ADVAIR  HFA) 115-21 MCG/ACT inhaler   albuterol  (PROVENTIL ) (2.5 MG/3ML) 0.083% nebulizer solution         Meds ordered this encounter  Medications   azithromycin  (ZITHROMAX  Z-PAK) 250 MG tablet    Sig: Use as directed on package.    Dispense:  6 tablet    Refill:  0   promethazine -dextromethorphan (PROMETHAZINE -DM) 6.25-15 MG/5ML syrup    Sig: Take 5 mLs by mouth 4 (four) times daily as needed for cough.    Dispense:  240 mL    Refill:  0   methylPREDNISolone  (MEDROL  DOSEPAK) 4 MG TBPK tablet    Sig: Use as directed on package.    Dispense:  21 tablet    Refill:  0   albuterol  (VENTOLIN  HFA) 108 (90 Base) MCG/ACT inhaler    Sig: Inhale 1-2 puffs into the lungs every 6 (six) hours as needed.    Dispense:  6.7 g     Refill:  3   fluticasone -salmeterol (ADVAIR  HFA) 115-21 MCG/ACT inhaler    Sig: Inhale 2 puffs into the lungs 2 (two) times daily.    Dispense:  12 g    Refill:  5   albuterol  (PROVENTIL ) (2.5 MG/3ML) 0.083% nebulizer solution    Sig: Take 3 mLs (2.5 mg total) by nebulization every 4 (four) hours as needed for wheezing or shortness of breath.    Dispense:  150 mL    Refill:  1      Follow-up: Return for f/u with PCP.  Marolyn Noel, MD "

## 2024-04-21 NOTE — Assessment & Plan Note (Signed)
Re- start Vit B12 °

## 2024-04-21 NOTE — Assessment & Plan Note (Addendum)
 Zpack, Prom cough syr

## 2024-05-05 ENCOUNTER — Other Ambulatory Visit (HOSPITAL_COMMUNITY): Payer: Self-pay

## 2024-06-01 ENCOUNTER — Encounter: Payer: Self-pay | Admitting: Internal Medicine

## 2024-06-01 MED ORDER — BACLOFEN 10 MG PO TABS: 10.0000 mg | ORAL_TABLET | Freq: Two times a day (BID) | ORAL | 1 refills | Status: AC | PRN

## 2024-07-28 ENCOUNTER — Ambulatory Visit: Admitting: Neurology

## 2024-08-10 ENCOUNTER — Ambulatory Visit: Admitting: Neurology

## 2024-09-19 ENCOUNTER — Ambulatory Visit: Admitting: Internal Medicine
